# Patient Record
Sex: Male | Born: 1940 | Race: White | Hispanic: No | Marital: Married | Smoking: Former smoker
Health system: Southern US, Community
[De-identification: ages and names within clinical notes are randomized; demographics above are authoritative.]

## PROBLEM LIST (undated history)

## (undated) DIAGNOSIS — J449 Chronic obstructive pulmonary disease, unspecified: Secondary | ICD-10-CM

## (undated) DIAGNOSIS — I639 Cerebral infarction, unspecified: Secondary | ICD-10-CM

## (undated) DIAGNOSIS — R413 Other amnesia: Secondary | ICD-10-CM

## (undated) DIAGNOSIS — R51 Headache: Secondary | ICD-10-CM

## (undated) DIAGNOSIS — K219 Gastro-esophageal reflux disease without esophagitis: Secondary | ICD-10-CM

## (undated) DIAGNOSIS — Z9289 Personal history of other medical treatment: Secondary | ICD-10-CM

## (undated) DIAGNOSIS — IMO0001 Reserved for inherently not codable concepts without codable children: Secondary | ICD-10-CM

## (undated) DIAGNOSIS — R32 Unspecified urinary incontinence: Secondary | ICD-10-CM

## (undated) DIAGNOSIS — I4891 Unspecified atrial fibrillation: Secondary | ICD-10-CM

## (undated) DIAGNOSIS — M8580 Other specified disorders of bone density and structure, unspecified site: Secondary | ICD-10-CM

## (undated) DIAGNOSIS — E785 Hyperlipidemia, unspecified: Secondary | ICD-10-CM

## (undated) HISTORY — PX: INGUINAL HERNIA REPAIR: SHX194

## (undated) HISTORY — DX: Chronic obstructive pulmonary disease, unspecified: J44.9

## (undated) HISTORY — DX: Other amnesia: R41.3

## (undated) HISTORY — DX: Cerebral infarction, unspecified: I63.9

## (undated) HISTORY — PX: OTHER SURGICAL HISTORY: SHX169

## (undated) HISTORY — PX: CATARACT EXTRACTION, BILATERAL: SHX1313

## (undated) HISTORY — DX: Personal history of other medical treatment: Z92.89

## (undated) HISTORY — DX: Unspecified atrial fibrillation: I48.91

## (undated) HISTORY — DX: Unspecified urinary incontinence: R32

## (undated) HISTORY — DX: Hyperlipidemia, unspecified: E78.5

## (undated) HISTORY — DX: Headache: R51

## (undated) HISTORY — DX: Other specified disorders of bone density and structure, unspecified site: M85.80

---
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
## 2001-01-01 ENCOUNTER — Ambulatory Visit (HOSPITAL_COMMUNITY): Admission: RE | Admit: 2001-01-01 | Discharge: 2001-01-01 | Payer: Self-pay | Admitting: Gastroenterology

## 2001-01-01 ENCOUNTER — Encounter (INDEPENDENT_AMBULATORY_CARE_PROVIDER_SITE_OTHER): Payer: Self-pay | Admitting: *Deleted

## 2003-09-12 ENCOUNTER — Inpatient Hospital Stay (HOSPITAL_COMMUNITY): Admission: EM | Admit: 2003-09-12 | Discharge: 2003-09-14 | Payer: Self-pay

## 2003-11-02 ENCOUNTER — Ambulatory Visit (HOSPITAL_COMMUNITY): Admission: RE | Admit: 2003-11-02 | Discharge: 2003-11-02 | Payer: Self-pay | Admitting: Internal Medicine

## 2004-09-04 ENCOUNTER — Ambulatory Visit: Payer: Self-pay | Admitting: Internal Medicine

## 2004-09-30 ENCOUNTER — Ambulatory Visit: Payer: Self-pay | Admitting: Internal Medicine

## 2005-01-07 ENCOUNTER — Ambulatory Visit: Payer: Self-pay | Admitting: Internal Medicine

## 2005-03-26 ENCOUNTER — Ambulatory Visit: Payer: Self-pay | Admitting: Pulmonary Disease

## 2005-04-09 ENCOUNTER — Ambulatory Visit: Payer: Self-pay | Admitting: Internal Medicine

## 2005-04-30 ENCOUNTER — Ambulatory Visit: Payer: Self-pay | Admitting: Internal Medicine

## 2005-06-30 ENCOUNTER — Ambulatory Visit: Payer: Self-pay | Admitting: Internal Medicine

## 2005-07-01 ENCOUNTER — Ambulatory Visit: Payer: Self-pay | Admitting: Internal Medicine

## 2005-09-30 ENCOUNTER — Ambulatory Visit: Payer: Self-pay | Admitting: Internal Medicine

## 2005-12-30 ENCOUNTER — Ambulatory Visit: Payer: Self-pay | Admitting: Internal Medicine

## 2006-06-29 ENCOUNTER — Ambulatory Visit: Payer: Self-pay | Admitting: Internal Medicine

## 2006-12-28 ENCOUNTER — Ambulatory Visit: Payer: Self-pay | Admitting: Internal Medicine

## 2007-06-28 ENCOUNTER — Ambulatory Visit: Payer: Self-pay | Admitting: Internal Medicine

## 2007-07-08 ENCOUNTER — Ambulatory Visit: Payer: Self-pay | Admitting: Internal Medicine

## 2007-07-28 ENCOUNTER — Ambulatory Visit: Payer: Self-pay | Admitting: Internal Medicine

## 2007-12-24 DIAGNOSIS — M899 Disorder of bone, unspecified: Secondary | ICD-10-CM | POA: Insufficient documentation

## 2007-12-24 DIAGNOSIS — R519 Headache, unspecified: Secondary | ICD-10-CM | POA: Insufficient documentation

## 2007-12-24 DIAGNOSIS — J449 Chronic obstructive pulmonary disease, unspecified: Secondary | ICD-10-CM

## 2007-12-24 DIAGNOSIS — M949 Disorder of cartilage, unspecified: Secondary | ICD-10-CM

## 2007-12-24 DIAGNOSIS — J302 Other seasonal allergic rhinitis: Secondary | ICD-10-CM | POA: Insufficient documentation

## 2007-12-24 DIAGNOSIS — R51 Headache: Secondary | ICD-10-CM

## 2007-12-24 DIAGNOSIS — J3089 Other allergic rhinitis: Secondary | ICD-10-CM

## 2007-12-27 ENCOUNTER — Ambulatory Visit: Payer: Self-pay | Admitting: Internal Medicine

## 2008-01-05 ENCOUNTER — Ambulatory Visit: Payer: Self-pay | Admitting: Cardiology

## 2008-01-14 ENCOUNTER — Encounter: Payer: Self-pay | Admitting: Internal Medicine

## 2008-01-14 ENCOUNTER — Encounter: Payer: Self-pay | Admitting: Cardiology

## 2008-01-14 ENCOUNTER — Ambulatory Visit: Payer: Self-pay

## 2008-02-16 ENCOUNTER — Ambulatory Visit: Payer: Self-pay | Admitting: Cardiology

## 2008-06-28 ENCOUNTER — Ambulatory Visit: Payer: Self-pay | Admitting: Internal Medicine

## 2008-07-04 ENCOUNTER — Telehealth (INDEPENDENT_AMBULATORY_CARE_PROVIDER_SITE_OTHER): Payer: Self-pay | Admitting: *Deleted

## 2008-07-19 ENCOUNTER — Telehealth (INDEPENDENT_AMBULATORY_CARE_PROVIDER_SITE_OTHER): Payer: Self-pay | Admitting: *Deleted

## 2008-09-05 ENCOUNTER — Ambulatory Visit: Payer: Self-pay | Admitting: Internal Medicine

## 2008-10-04 ENCOUNTER — Ambulatory Visit: Payer: Self-pay | Admitting: Internal Medicine

## 2008-10-05 ENCOUNTER — Encounter: Payer: Self-pay | Admitting: Internal Medicine

## 2008-10-05 LAB — CONVERTED CEMR LAB: IgE (Immunoglobulin E), Serum: 12.9 intl units/mL (ref 0.0–180.0)

## 2008-10-10 ENCOUNTER — Encounter: Payer: Self-pay | Admitting: Internal Medicine

## 2008-10-12 ENCOUNTER — Ambulatory Visit: Payer: Self-pay | Admitting: Internal Medicine

## 2008-10-12 DIAGNOSIS — R0602 Shortness of breath: Secondary | ICD-10-CM | POA: Insufficient documentation

## 2008-11-10 ENCOUNTER — Ambulatory Visit: Payer: Self-pay | Admitting: Internal Medicine

## 2008-11-10 LAB — CONVERTED CEMR LAB
Basophils Absolute: 0.1 10*3/uL (ref 0.0–0.1)
Basophils Relative: 0.5 % (ref 0.0–3.0)
Eosinophils Absolute: 0.2 10*3/uL (ref 0.0–0.7)
Eosinophils Relative: 1.4 % (ref 0.0–5.0)
HCT: 46 % (ref 39.0–52.0)
Lymphocytes Relative: 12.7 % (ref 12.0–46.0)
MCHC: 33.9 g/dL (ref 30.0–36.0)
MCV: 97.9 fL (ref 78.0–100.0)
Monocytes Absolute: 0.7 10*3/uL (ref 0.1–1.0)
Neutrophils Relative %: 79.2 % — ABNORMAL HIGH (ref 43.0–77.0)
Platelets: 284 10*3/uL (ref 150.0–400.0)
RBC: 4.7 M/uL (ref 4.22–5.81)
RDW: 14.9 % — ABNORMAL HIGH (ref 11.5–14.6)
WBC: 11.3 10*3/uL — ABNORMAL HIGH (ref 4.5–10.5)

## 2008-11-27 LAB — CONVERTED CEMR LAB
A-1 Antitrypsin, Ser: 112 mg/dL (ref 83–200)
IgE (Immunoglobulin E), Serum: 10.5 intl units/mL (ref 0.0–180.0)

## 2008-12-26 ENCOUNTER — Ambulatory Visit: Payer: Self-pay | Admitting: Internal Medicine

## 2009-02-05 ENCOUNTER — Encounter: Payer: Self-pay | Admitting: Internal Medicine

## 2009-03-26 ENCOUNTER — Telehealth: Payer: Self-pay | Admitting: Internal Medicine

## 2009-03-27 ENCOUNTER — Ambulatory Visit: Payer: Self-pay | Admitting: Internal Medicine

## 2009-03-28 ENCOUNTER — Telehealth (INDEPENDENT_AMBULATORY_CARE_PROVIDER_SITE_OTHER): Payer: Self-pay | Admitting: *Deleted

## 2009-04-03 ENCOUNTER — Encounter: Payer: Self-pay | Admitting: Internal Medicine

## 2009-04-03 ENCOUNTER — Ambulatory Visit: Payer: Self-pay | Admitting: Oncology

## 2009-04-03 LAB — COMPREHENSIVE METABOLIC PANEL
Albumin: 3.9 g/dL (ref 3.5–5.2)
CO2: 26 mEq/L (ref 19–32)
Calcium: 9.3 mg/dL (ref 8.4–10.5)
Glucose, Bld: 114 mg/dL — ABNORMAL HIGH (ref 70–99)
Sodium: 137 mEq/L (ref 135–145)
Total Bilirubin: 0.6 mg/dL (ref 0.3–1.2)
Total Protein: 7.3 g/dL (ref 6.0–8.3)

## 2009-04-03 LAB — CBC & DIFF AND RETIC
Basophils Absolute: 0 10*3/uL (ref 0.0–0.1)
EOS%: 0.1 % (ref 0.0–7.0)
HGB: 14.9 g/dL (ref 13.0–17.1)
MCH: 32.9 pg (ref 27.2–33.4)
MCV: 99.1 fL — ABNORMAL HIGH (ref 79.3–98.0)
MONO%: 5.9 % (ref 0.0–14.0)
RBC: 4.53 10*6/uL (ref 4.20–5.82)
RDW: 13.9 % (ref 11.0–14.6)
Retic Ct Abs: 82.45 10*3/uL — ABNORMAL HIGH (ref 24.10–77.50)

## 2009-04-03 LAB — MORPHOLOGY: PLT EST: ADEQUATE

## 2009-04-03 LAB — CHCC SMEAR

## 2009-04-06 LAB — JAK2 GENOTYPR: JAK2 GenotypR: NOT DETECTED

## 2009-04-12 ENCOUNTER — Encounter: Payer: Self-pay | Admitting: Internal Medicine

## 2009-05-01 ENCOUNTER — Encounter: Payer: Self-pay | Admitting: Internal Medicine

## 2009-06-25 ENCOUNTER — Ambulatory Visit: Payer: Self-pay | Admitting: Internal Medicine

## 2009-07-02 ENCOUNTER — Telehealth: Payer: Self-pay | Admitting: Internal Medicine

## 2009-07-05 ENCOUNTER — Ambulatory Visit: Payer: Self-pay | Admitting: Internal Medicine

## 2009-07-07 ENCOUNTER — Encounter: Payer: Self-pay | Admitting: Internal Medicine

## 2009-07-10 ENCOUNTER — Telehealth: Payer: Self-pay | Admitting: Internal Medicine

## 2009-07-12 ENCOUNTER — Telehealth: Payer: Self-pay | Admitting: Internal Medicine

## 2009-07-12 ENCOUNTER — Encounter: Payer: Self-pay | Admitting: Internal Medicine

## 2009-07-23 ENCOUNTER — Encounter: Payer: Self-pay | Admitting: Internal Medicine

## 2009-07-25 ENCOUNTER — Telehealth (INDEPENDENT_AMBULATORY_CARE_PROVIDER_SITE_OTHER): Payer: Self-pay | Admitting: *Deleted

## 2009-07-30 ENCOUNTER — Encounter: Payer: Self-pay | Admitting: Internal Medicine

## 2009-08-05 ENCOUNTER — Telehealth: Payer: Self-pay | Admitting: Internal Medicine

## 2009-08-06 ENCOUNTER — Ambulatory Visit: Payer: Self-pay | Admitting: Internal Medicine

## 2009-08-27 ENCOUNTER — Ambulatory Visit: Payer: Self-pay | Admitting: Internal Medicine

## 2009-08-27 ENCOUNTER — Inpatient Hospital Stay (HOSPITAL_COMMUNITY): Admission: EM | Admit: 2009-08-27 | Discharge: 2009-09-11 | Payer: Self-pay | Admitting: Emergency Medicine

## 2009-08-27 ENCOUNTER — Ambulatory Visit: Payer: Self-pay | Admitting: Cardiology

## 2009-09-03 ENCOUNTER — Encounter: Payer: Self-pay | Admitting: Cardiovascular Disease

## 2009-09-04 ENCOUNTER — Encounter: Payer: Self-pay | Admitting: Cardiology

## 2009-09-11 ENCOUNTER — Telehealth (INDEPENDENT_AMBULATORY_CARE_PROVIDER_SITE_OTHER): Payer: Self-pay | Admitting: *Deleted

## 2009-09-14 ENCOUNTER — Telehealth (INDEPENDENT_AMBULATORY_CARE_PROVIDER_SITE_OTHER): Payer: Self-pay | Admitting: *Deleted

## 2009-09-14 DIAGNOSIS — R5381 Other malaise: Secondary | ICD-10-CM

## 2009-09-14 DIAGNOSIS — R5383 Other fatigue: Secondary | ICD-10-CM

## 2009-09-17 ENCOUNTER — Ambulatory Visit: Payer: Self-pay | Admitting: Internal Medicine

## 2009-09-18 ENCOUNTER — Encounter: Payer: Self-pay | Admitting: Internal Medicine

## 2009-09-20 ENCOUNTER — Telehealth: Payer: Self-pay | Admitting: Adult Health

## 2009-10-04 ENCOUNTER — Ambulatory Visit: Payer: Self-pay | Admitting: Internal Medicine

## 2009-10-05 ENCOUNTER — Ambulatory Visit: Payer: Self-pay | Admitting: Internal Medicine

## 2009-10-11 ENCOUNTER — Encounter: Payer: Self-pay | Admitting: Adult Health

## 2009-10-15 DIAGNOSIS — E785 Hyperlipidemia, unspecified: Secondary | ICD-10-CM

## 2009-10-15 DIAGNOSIS — I480 Paroxysmal atrial fibrillation: Secondary | ICD-10-CM | POA: Insufficient documentation

## 2009-10-16 ENCOUNTER — Ambulatory Visit: Payer: Self-pay | Admitting: Internal Medicine

## 2009-10-16 DIAGNOSIS — R259 Unspecified abnormal involuntary movements: Secondary | ICD-10-CM | POA: Insufficient documentation

## 2009-10-22 ENCOUNTER — Telehealth (INDEPENDENT_AMBULATORY_CARE_PROVIDER_SITE_OTHER): Payer: Self-pay | Admitting: *Deleted

## 2009-10-23 ENCOUNTER — Telehealth: Payer: Self-pay | Admitting: Internal Medicine

## 2009-10-23 ENCOUNTER — Telehealth (INDEPENDENT_AMBULATORY_CARE_PROVIDER_SITE_OTHER): Payer: Self-pay | Admitting: *Deleted

## 2009-10-23 ENCOUNTER — Encounter: Payer: Self-pay | Admitting: Adult Health

## 2009-10-23 DIAGNOSIS — I499 Cardiac arrhythmia, unspecified: Secondary | ICD-10-CM | POA: Insufficient documentation

## 2009-10-29 ENCOUNTER — Ambulatory Visit: Payer: Self-pay | Admitting: Internal Medicine

## 2009-10-30 ENCOUNTER — Encounter: Payer: Self-pay | Admitting: Internal Medicine

## 2009-10-30 ENCOUNTER — Ambulatory Visit: Payer: Self-pay | Admitting: Internal Medicine

## 2009-11-07 ENCOUNTER — Ambulatory Visit: Payer: Self-pay | Admitting: Internal Medicine

## 2009-11-07 ENCOUNTER — Ambulatory Visit: Payer: Self-pay

## 2009-11-13 ENCOUNTER — Encounter: Payer: Self-pay | Admitting: Internal Medicine

## 2009-11-15 ENCOUNTER — Telehealth (INDEPENDENT_AMBULATORY_CARE_PROVIDER_SITE_OTHER): Payer: Self-pay | Admitting: *Deleted

## 2009-12-04 ENCOUNTER — Ambulatory Visit: Payer: Self-pay | Admitting: Internal Medicine

## 2009-12-10 ENCOUNTER — Encounter: Payer: Self-pay | Admitting: Internal Medicine

## 2009-12-15 ENCOUNTER — Telehealth (INDEPENDENT_AMBULATORY_CARE_PROVIDER_SITE_OTHER): Payer: Self-pay | Admitting: *Deleted

## 2010-01-13 ENCOUNTER — Encounter: Payer: Self-pay | Admitting: Internal Medicine

## 2010-01-18 ENCOUNTER — Encounter: Payer: Self-pay | Admitting: Internal Medicine

## 2010-01-27 ENCOUNTER — Encounter: Payer: Self-pay | Admitting: Internal Medicine

## 2010-02-06 ENCOUNTER — Ambulatory Visit: Payer: Self-pay | Admitting: Internal Medicine

## 2010-02-06 ENCOUNTER — Telehealth (INDEPENDENT_AMBULATORY_CARE_PROVIDER_SITE_OTHER): Payer: Self-pay | Admitting: *Deleted

## 2010-02-06 DIAGNOSIS — J189 Pneumonia, unspecified organism: Secondary | ICD-10-CM

## 2010-02-06 DIAGNOSIS — R209 Unspecified disturbances of skin sensation: Secondary | ICD-10-CM | POA: Insufficient documentation

## 2010-02-06 LAB — CONVERTED CEMR LAB
Basophils Absolute: 0 10*3/uL (ref 0.0–0.1)
Basophils Relative: 0.1 % (ref 0.0–3.0)
Eosinophils Absolute: 0 10*3/uL (ref 0.0–0.7)
Eosinophils Relative: 0.1 % (ref 0.0–5.0)
HCT: 39.3 % (ref 39.0–52.0)
Hemoglobin: 13.3 g/dL (ref 13.0–17.0)
Lymphocytes Relative: 8.7 % — ABNORMAL LOW (ref 12.0–46.0)
Lymphs Abs: 2 10*3/uL (ref 0.7–4.0)
MCHC: 33.8 g/dL (ref 30.0–36.0)
MCV: 88.9 fL (ref 78.0–100.0)
Monocytes Absolute: 1.2 10*3/uL — ABNORMAL HIGH (ref 0.1–1.0)
Monocytes Relative: 5.4 % (ref 3.0–12.0)
Neutro Abs: 19.5 10*3/uL — ABNORMAL HIGH (ref 1.4–7.7)
Neutrophils Relative %: 85.7 % — ABNORMAL HIGH (ref 43.0–77.0)
Platelets: 272 10*3/uL (ref 150.0–400.0)
RBC: 4.42 M/uL (ref 4.22–5.81)
RDW: 17 % — ABNORMAL HIGH (ref 11.5–14.6)
WBC: 22.8 10*3/uL (ref 4.5–10.5)

## 2010-02-11 ENCOUNTER — Ambulatory Visit: Payer: Self-pay | Admitting: Internal Medicine

## 2010-02-18 ENCOUNTER — Telehealth (INDEPENDENT_AMBULATORY_CARE_PROVIDER_SITE_OTHER): Payer: Self-pay | Admitting: *Deleted

## 2010-04-05 ENCOUNTER — Ambulatory Visit: Payer: Self-pay | Admitting: Internal Medicine

## 2010-05-30 ENCOUNTER — Encounter: Payer: Self-pay | Admitting: Internal Medicine

## 2010-07-04 ENCOUNTER — Ambulatory Visit: Payer: Self-pay | Admitting: Internal Medicine

## 2010-07-22 ENCOUNTER — Encounter: Payer: Self-pay | Admitting: Internal Medicine

## 2010-08-15 ENCOUNTER — Encounter: Payer: Self-pay | Admitting: Internal Medicine

## 2010-08-24 ENCOUNTER — Telehealth: Payer: Self-pay | Admitting: Emergency Medicine

## 2010-08-28 ENCOUNTER — Telehealth (INDEPENDENT_AMBULATORY_CARE_PROVIDER_SITE_OTHER): Payer: Self-pay | Admitting: *Deleted

## 2010-09-11 ENCOUNTER — Encounter: Payer: Self-pay | Admitting: Internal Medicine

## 2010-09-11 ENCOUNTER — Ambulatory Visit
Admission: RE | Admit: 2010-09-11 | Discharge: 2010-09-11 | Payer: Self-pay | Source: Home / Self Care | Attending: Internal Medicine | Admitting: Internal Medicine

## 2010-09-26 NOTE — Miscellaneous (Signed)
Summary: BONE DENSITY  Clinical Lists Changes  Orders: Added new Test order of T-Bone Densitometry (77080) - Signed Added new Test order of T-Lumbar Vertebral Assessment (77082) - Signed 

## 2010-09-26 NOTE — Progress Notes (Signed)
Summary: PT and OT orders  Phone Note From Other Clinic   Caller: marie williams-pt care management at [facility] Call For: young Summary of Call: caller states that pt has been d/c'd from [facility]. there's an order w/ [facility] for pt to have PT and OT. pt's PCP is dr Andrey Campanile elkins. dr [name] doesn't sign for these things per caller. would dr young sign these orders for pt? call marie williams at [phone] Initial call taken by: Tivis Ringer,  September 11, 2009 10:42 AM  Follow-up for Phone Call        will you sign PT OT orders for this patient? Please advise. Carron Curie CMA  September 11, 2009 10:45 AM   Additional Follow-up for Phone Call Additional follow up Details #1::        Per CDY-will sign orders. Reynaldo Minium CMA  September 11, 2009 12:37 PM     Additional Follow-up for Phone Call Additional follow up Details #2::    per CY:  Yes, he will sign orders.    Called and spoke with Hilda Lias and informed her CY will sign orders.  Aundra Millet Reynolds LPN  September 11, 2009 12:43 PM

## 2010-09-26 NOTE — Progress Notes (Signed)
Summary: [facility] augmentin  Phone Note Call from Patient   Caller: Patient Call For: [name] Summary of Call: patient phoned thinks he has a virus or cold he is wheezing really  bad coughing up a thick yellow  mucas. He stated that he called on Saturday and was called in a Zpack he took the last one today and his symptons are better but he thinks that after he takes the last pill it make backtrack on him. [facility] [address] and he can be reached [phone]  Initial call taken by: Vedia Coffer,  August 28, 2010 11:14 AM  Follow-up for Phone Call        Pt says he took the last dose in Zpak today but he is still wheezing, cough is prod w/ yellow sputum, sore throat and SOB. Cough is worse at night and he has trouble sleeping. Took 30mg  on Prednisone today. Pt wants to know if he needs a stronger abx to knock out this infection. Pls advise. Allergies (verified):  1)  ! * Shrimp 2)  ! * Zolpidem Follow-up by: Michel Bickers CMA,  August 28, 2010 2:18 PM    New/Updated Medications: AUGMENTIN 875-125 MG TABS (AMOXICILLIN-POT CLAVULANATE) 1 by mouth two times a day Prescriptions: AUGMENTIN 875-125 MG TABS (AMOXICILLIN-POT CLAVULANATE) 1 by mouth two times a day  #14 x 0   Entered by:   Renold Genta RCP, LPN   Authorized by:   Waymon Budge MD   Signed by:   Renold Genta RCP, LPN on 16/05/9603   Method used:   [facility]        [facility]  [address] [phone][facility] (retail)       [address]       [place], [place]  [address]       Ph: [phone] or [phone]       Fax: [phone]   RxID:   [id number]  Renold Genta RCP, LPN  August 28, 2010 3:41 PM   Appended Document: [facility]in Rx sent to pharmacy for augmentin per [name], pt aware

## 2010-09-26 NOTE — Assessment & Plan Note (Signed)
Summary: Acute NP office visit - bronchitis   Primary Provider/Referring Provider:  [name]  CC:  1 month follow up - c/o dyspnea, increased fatigue, cough occ producing white/yellow mucus, temp up to 101, c/s, tigtness in chest x1week - denies wheezing, and chest congesiton.  also c/o increased edema in feet x2weeks and increased nervousness x6weeks.Marland Kitchen  History of Present Illness: August 06, 2009- Asthma/ COPD, steroid dependent, Allergic rhinitis..................Marland Kitchenwife here He had been qualified for ome O2 with room air sesats to 88%. Oxygen helped initially, but ov er last 2 days began noting easier dyspenea with exertion- worse yestrerday. His own sats range 80- 92% on oxygen. Pulse noted 40 - 140.  A cold with cough yesterday, much better today.   Occasional dry cough. Frontal- vetrtex headache. Grandkids have colds. He remains o n 30 mg prednisone daily.--TX w/ steroid burst, Xray with no acute changes.   August 27, 2009--Presents today for an acute office visit. Complains of increased SOB, some wheezing, cough occ producing yellow/brown mucus, increased edema in both feet x3days. He has had no improvement since last visit. Currently on prednisone 30mg . In office he is in resp. distress w/ increased accessory muscle use. HR is in the 180s. He will require admission. He is very dyspneic, no appetite and weak .Admitted   September 20, 2009--Pt presents for a post hosp follow up - states breathing has improved   pt would like a script for a new mattress for his hosp bed at home. Admitted 1/3-1/18/11  for  Atrial fibrillation/flutter status post ablation. 2. Acute on chronic respiratory failure in the setting of severe chronic obstructive pulmonary disease exacerbation  , PNA.  He was admitted to ICU, w/ failure of  BiPAP requirng intubation. Tx w/ aggressive pulmonary hygiene, IV abx, steroids and nebs.  He was sucessfully weaned from vent.  He underwent successful ablation on  September 05, 2009,  however, postprocedure did convert back to atrial  fibrillation requiring adjustment with medications per Cardiology to flecainide and Cardizem with transient Coumadin therapy.  Coumadin therapy has been stopped per Cardiology and aspirin recommended for discharge. Since discharge doing better w/ decreased dyspnea. Requests bone density set up.     October 05, 2009 --Presents for 1 month follow up -Recent exacerbation w/ Vent Dependent resp failure 1 month ago. Has had a slow recovery. Continues to have  dyspnea, increased fatigue, cough occ producing white/yellow mucus, temp up to 101, c/s, tigtness in chest x1week -  Cough and congestion are improving over last 2 days but still weak. fett swell in evening, resolves in am. Denies chest pain,  , orthopnea, hemoptysis, fever, n/v/d.   Medications Prior to Update: 1)  Prednisone 10 Mg  Tabs (Prednisone) .... Take 3 Tabs By Mouth  Every Morning 2)  Fosamax 70 Mg Tabs (Alendronate Sodium) .Marland Kitchen.. 1 By Mouth Weekly 3)  Proair Hfa 108 (90 Base) Mcg/act  Aers (Albuterol Sulfate) .... Inhale 2 Puffs Every 4 Hours As Needed 4)  Epipen 2-Pak 0.3 Mg/0.77ml (1:1000)  Devi (Epinephrine Hcl (Anaphylaxis)) .... As Needed 5)  Centrum Silver  Tabs (Multiple Vitamins-Minerals) .... Take 1 By Mouth Once Daily 6)  Ipratropium-Albuterol 0.5-2.5 (3) Mg/51ml Soln (Ipratropium-Albuterol) .Marland Kitchen.. 1 Neb Four Times A Day As Needed 7)  Portable Nebulizer For Aerosol Meds 8)  Oxygen 1 L/m Sleep/ Rest, 4 L/m Exertion .... [facility] 9)  Symbicort 160-4.5 Mcg/act Aero (Budesonide-Formoterol Fumarate) .... 2 Puffs Two Times A Day 10)  Wheelchair, Adult, Folding, With Foot Rests  11)  Power Wheelchair 12)  Tamsulosin Hcl 0.4 Mg Caps (Tamsulosin Hcl) .... Take 1 Tablet By Mouth Once A Day 13)  Tylenol 325 Mg Tabs (Acetaminophen) .... Per Bottle As Needed 14)  Prednisone 20 Mg Tabs (Prednisone) .... Taper As Directed On Hosp Discharge Sheet 15)  Ecotrin 325 Mg Tbec (Aspirin) .... Take 1 Tablet  By Mouth Once A Day 16)  Ensure  Liqd (Nutritional Supplements) .Marland Kitchen.. 1 Can By Mouth Three Times A Day 17)  Flecainide Acetate 100 Mg Tabs (Flecainide Acetate) .... Take 1 Tablet By Mouth Two Times A Day 18)  Nasal 0.65 % Soln (Saline) .Marland Kitchen.. 1 Spray Each Nostril Two Times A Day 19)  Verapamil Hcl Cr 240 Mg Cr-Tabs (Verapamil Hcl) .... Take 1 Tablet By Mouth Once A Day 20)  Mucinex 600 Mg Xr12h-Tab (Guaifenesin) .... Take 1-2 Tablets Every 12 Hours As Needed 21)  Furosemide 20 Mg Tabs (Furosemide) .Marland Kitchen.. 1 By Mouth Once Daily For Leg Swelling  Current Medications (verified): 1)  Fosamax 70 Mg Tabs (Alendronate Sodium) .Marland Kitchen.. 1 By Mouth Weekly 2)  Proair Hfa 108 (90 Base) Mcg/act  Aers (Albuterol Sulfate) .... Inhale 2 Puffs Every 4 Hours As Needed 3)  Epipen 2-Pak 0.3 Mg/0.83ml (1:1000)  Devi (Epinephrine Hcl (Anaphylaxis)) .... As Needed 4)  Centrum Silver  Tabs (Multiple Vitamins-Minerals) .... Take 1 By Mouth Once Daily 5)  Ipratropium-Albuterol 0.5-2.5 (3) Mg/53ml Soln (Ipratropium-Albuterol) .Marland Kitchen.. 1 Neb Four Times A Day As Needed 6)  Portable Nebulizer For Aerosol Meds 7)  Oxygen 1 L/m Sleep/ Rest, 4 L/m Exertion .... [employer] 8)  Symbicort 160-4.5 Mcg/act Aero (Budesonide-Formoterol Fumarate) .... 2 Puffs Two Times A Day 9)  Wheelchair, Adult, Folding, With Foot Rests 10)  Power Wheelchair 11)  Tamsulosin Hcl 0.4 Mg Caps (Tamsulosin Hcl) .... Take 1 Tablet By Mouth Once A Day 12)  Tylenol 325 Mg Tabs (Acetaminophen) .... Per Bottle As Needed 13)  Prednisone 20 Mg Tabs (Prednisone) .... Take 1 Tablet By Mouth Once A Day 14)  Ecotrin 325 Mg Tbec (Aspirin) .... Take 1 Tablet By Mouth Once A Day 15)  Ensure  Liqd (Nutritional Supplements) .Marland Kitchen.. 1 Can By Mouth Three Times A Day 16)  Flecainide Acetate 100 Mg Tabs (Flecainide Acetate) .... Take 1 Tablet By Mouth Two Times A Day 17)  Nasal 0.65 % Soln (Saline) .Marland Kitchen.. 1 Spray Each Nostril Two Times A Day 18)  Verapamil Hcl Cr 240 Mg Cr-Tabs (Verapamil Hcl)  .... Take 1 Tablet By Mouth Once A Day 19)  Mucinex 600 Mg Xr12h-Tab (Guaifenesin) .... Take 1-2 Tablets Every 12 Hours As Needed 20)  Furosemide 20 Mg Tabs (Furosemide) .Marland Kitchen.. 1 By Mouth Once Daily For Leg Swelling 21)  Phazyme 180 Mg Caps (Simethicone) .... Per Bottle  Allergies (verified): 1)  ! * Shrimp 2)  ! * Zolpidem  Past History:  Past Medical History: Last updated: 12/24/2007 Allergic Rhinitis Asthma [facility]  Past Surgical History: Last updated: 10-Oct-2008 Appendectomy Inguinal hernia x 2  Family History: Last updated: 2008-10-10 Brother- died COPD Sister- copd Father died-copd, lung cancer Mother- died age 35 old age  Social History: Last updated: 2008/10/10 Patient states former smoker.- heavy, 3 ppd, quit 40 yrs ago Married Product manager plant retired  Risk Factors: Smoking Status: quit (12/27/2007)  Review of Systems      See HPI  Vital Signs:  Patient profile:   70 year old male Height:      72 inches Weight:  156.13 pounds BMI:     21.25 O2 Sat:      94 % on 3L cont Temp:     96.8 degrees F oral Pulse rate:   100 / minute BP sitting:   104 / 64  (left arm) Cuff size:   regular  Vitals Entered By: Boone Master CNA (October 05, 2009 10:47 AM)  O2 Flow:  3L cont CC: 1 month follow up - c/o dyspnea, increased fatigue, cough occ producing white/yellow mucus, temp up to 101, c/s, tigtness in chest x1week - denies wheezing, chest congesiton.  also c/o increased edema in feet x2weeks and increased nervousness x6weeks. Is Patient Diabetic? No Comments Medications reviewed with patient Daytime contact number verified with patient. Boone Master CNA  October 05, 2009 10:51 AM    Physical Exam  Additional Exam:  General: A/Ox3 NAD  SKIN: plethora, steroid echymoses on arms NODES: no lymphadenopathy HEENT: [place]/AT, EOM- WNL, Conjuctivae- clear, PERRLA, TM-WNL, Nose- clear, Throat- clear and wnl NECK: Supple w/ fair ROM, JVD-  none,  CHEST: Decreased BS in bases., Coarse BS w/ scattered rhonchi HEART- Reg rate, no m/r/g  ABDOMEN: scaphoid EAV:WUJW, nl pulses, 1 + edema  NEURO: Grossly intact to observation, resting tremor hands      Impression & Recommendations:  Problem # 1:  BRONCHITIS, OBSTRUCTIVE CHRONIC, ACUTE EXACERBATION (ICD-491.21)  Slow to resolve exacerbation  REC: check xray  Avelox 400mg  once daily for 7 days Mucinex DM two times a day as needed cough/congestion  Increase prednisone 20mg  3 tabs for 3 days, then 2 tabs once daily for 3 days then back 20mg  once daily  I will call with xray results.  Please contact office for sooner follow up if symptoms do not improve or worsen  follow up [name] in 2-3 weeks and as needed  Hold fosamax   Orders: Est. Patient Level IV (11914)  Medications Added to Medication List This Visit: 1)  Prednisone 20 Mg Tabs (Prednisone) .... Take 1 tablet by mouth once a day 2)  Phazyme 180 Mg Caps (Simethicone) .... Per bottle 3)  Avelox 400 Mg Tabs (Moxifloxacin hcl) .Marland Kitchen.. 1 by mouth once daily  Complete Medication List: 1)  Fosamax 70 Mg Tabs (Alendronate sodium) .Marland Kitchen.. 1 by mouth weekly 2)  Proair Hfa 108 (90 Base) Mcg/act Aers (Albuterol sulfate) .... Inhale 2 puffs every 4 hours as needed 3)  Epipen 2-pak 0.3 Mg/0.58ml (1:1000) Devi (Epinephrine hcl (anaphylaxis)) .... As needed 4)  Centrum Silver Tabs (Multiple vitamins-minerals) .... Take 1 by mouth once daily 5)  Ipratropium-albuterol 0.5-2.5 (3) Mg/30ml Soln (Ipratropium-albuterol) .Marland Kitchen.. 1 neb four times a day as needed 6)  Portable Nebulizer For Aerosol Meds  7)  Oxygen 1 L/m Sleep/ Rest, 4 L/m Exertion  .... [employer] 8)  Symbicort 160-4.5 Mcg/act Aero (Budesonide-formoterol fumarate) .... 2 puffs two times a day 9)  Wheelchair, Adult, Folding, With Foot Rests  10)  Power Wheelchair  11)  Tamsulosin Hcl 0.4 Mg Caps (Tamsulosin hcl) .... Take 1 tablet by mouth once a day 12)  Tylenol 325 Mg Tabs  (Acetaminophen) .... Per bottle as needed 13)  Prednisone 20 Mg Tabs (Prednisone) .... Take 1 tablet by mouth once a day 14)  Ecotrin 325 Mg Tbec (Aspirin) .... Take 1 tablet by mouth once a day 15)  Ensure Liqd (Nutritional supplements) .Marland Kitchen.. 1 can by mouth three times a day 16)  Flecainide Acetate 100 Mg Tabs (Flecainide acetate) .... Take 1 tablet by mouth two times a day  17)  Nasal 0.65 % Soln (Saline) .Marland Kitchen.. 1 spray each nostril two times a day 18)  Verapamil Hcl Cr 240 Mg Cr-tabs (Verapamil hcl) .... Take 1 tablet by mouth once a day 19)  Mucinex 600 Mg Xr12h-tab (Guaifenesin) .... Take 1-2 tablets every 12 hours as needed 20)  Furosemide 20 Mg Tabs (Furosemide) .Marland Kitchen.. 1 by mouth once daily for leg swelling 21)  Phazyme 180 Mg Caps (Simethicone) .... Per bottle 22)  Avelox 400 Mg Tabs (Moxifloxacin hcl) .Marland Kitchen.. 1 by mouth once daily  Other Orders: T-2 View CXR ([id number])  Patient Instructions: 1)  Avelox 400mg  once daily for 7 days 2)  Mucinex DM two times a day as needed cough/congestion  3)  Increase prednisone 20mg  3 tabs for 3 days, then 2 tabs once daily for 3 days then back 20mg  once daily  4)  I will call with xray results.  5)  Please contact office for sooner follow up if symptoms do not improve or worsen  6)  follow up [name] in 2-3 weeks and as needed  7)  Hold fosamax  Prescriptions: AVELOX 400 MG TABS (MOXIFLOXACIN HCL) 1 by mouth once daily  #7 x 0   Entered and Authorized by:   Rubye Oaks NP   Signed by:   Rubye Oaks NP on 10/05/2009   Method used:   [facility] [phone]* (retail)       [address]       [place], [place]  [address]       Ph: [phone] or [phone]       Fax: [phone]   RxID:   [id number]

## 2010-09-26 NOTE — Progress Notes (Signed)
Summary: **SK 3/8** talk to nurse  Medications Added PROPAFENONE HCL 225 MG TABS (PROPAFENONE HCL) two times a day       Phone Note Call from Patient Call back at [facility] Phone [phone]   Caller: Patient Reason for Call: Talk to Nurse Summary of Call: pt wants to talk about his flecanide.  Initial call taken by: Edman Circle,  October 23, 2009 3:54 PM  Follow-up for Phone Call        S/W pt and his "bad" tremors have stopped since stopping the Flecainide. He had "nervousness" prior to starting Flecainide and it is about the same. Per Dr. Odessa Fleming last OV note he will think about starting Rythmol if the tremors stopped from Flecainide stop. Pt is aware that Dr. [name] will be back from [place] on Monday and I will s/w him at that point. He will stay off Flecainide for now.  Follow-up by: Duncan Dull, RN, BSN,  October 24, 2009 11:47 AM  Additional Follow-up for Phone Call Additional follow up Details #1::        Per Dr. [name], pt will start Propafenone 225mg  two times a day and we will get a GXT 1 wk from start which will be tomorrow. Pt aware.  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

## 2010-09-26 NOTE — Letter (Signed)
Summary: LMN/[facility]  LMN/[facility]   Imported By: Lester Lakehead 10/15/2009 10:22:29  _____________________________________________________________________  External Attachment:    Type:   Image     Comment:   External Document

## 2010-09-26 NOTE — Assessment & Plan Note (Signed)
Summary: NP follow up - post hosp   Primary Provider/Referring Provider:  [name]  CC:  post hosp follow up - states breathing is improved.  pt would like a script for a new mattress for his hosp bed at home.  History of Present Illness:  August 06, 2009- Asthma/ COPD, steroid dependent, Allergic rhinitis..................Marland Kitchenwife here He had been qualified for ome O2 with room air sesats to 88%. Oxygen helped initially, but ov er last 2 days began noting easier dyspenea with exertion- worse yestrerday. His own sats range 80- 92% on oxygen. Pulse noted 40 - 140.  A cold with cough yesterday, much better today.   Occasional dry cough. Frontal- vetrtex headache. Grandkids have colds. He remains o n 30 mg prednisone daily.--TX w/ steroid burst, Xray with no acute changes.   August 27, 2009--Presents today for an acute office visit. Complains of increased SOB, some wheezing, cough occ producing yellow/brown mucus, increased edema in both feet x3days. He has had no improvement since last visit. Currently on prednisone 30mg . In office he is in resp. distress w/ increased accessory muscle use. HR is in the 180s. He will require admission. He is very dyspneic, no appetite and weak .Admitted   September 20, 2009--Pt presents for a post hosp follow up - states breathing has improved   pt would like a script for a new mattress for his hosp bed at home. Admitted 1/3-1/18/11  for  Atrial fibrillation/flutter status post ablation. 2. Acute on chronic respiratory failure in the setting of severe     chronic obstructive pulmonary disease exacerbation  , PNA.  He was admitted to ICU, w/ failure of  BiPAP requirng intubation. Tx w/ aggressive pulmonary hygiene, IV abx, steroids and nebs.  He was sucessfully weaned from vent.  He underwent successful ablation on  September 05, 2009, however, postprocedure did convert back to atrial  fibrillation requiring adjustment with medications per Cardiology     to flecainide and  Cardizem with transient Coumadin therapy.  Coumadin therapy has been stopped per Cardiology and aspirin recommended for discharge. Since discharge doing better w/ decreased dyspnea. Requests bone density set up. .Denies chest pain, or cough , fever or abd pian. Atrial fibrillation/atrial flutter, status post ablation on September 05, 2009.  As per history of present illness, Mr. Mcadams presented     to the pulmonary office on August 27, 2009 complaining of     increasing shortness of breath, cough, worsening dyspnea, and lower     extremity edema.  At that time, he was noted to be in SVT in the     180s.  He was directly admitted to the intensive care unit.  A     trial of BiPAP was initiated.  However, he decompensated requiring     intubation for approximately 48 hours from August 28, 2009 to     August 30, 2009 at which time, he was subsequently extubated to     BiPAP and continued to improve from a respiratory standpoint.  He     was maintained initially on IV antibiotics for right lower lobe     infiltrate on chest x-ray, IV steroids, nebulized bronchodilators,     and low-dose anti-anxiolytics.  He underwent successful ablation on     September 05, 2009, however, postprocedure did convert back to atrial     fibrillation requiring adjustment with medications per Cardiology     to flecainide and Cardizem with transient Coumadin therapy.  Coumadin therapy has been stopped per Cardiology and aspirin     recommended for discharge. 2. Acute on chronic respiratory failure in the setting of severe COPD     with chronic steroid and O2 dependent.  See history of present     illness for details.  The patient will be discharged on previous     home doses of albuterol.  Xopenex was considered, however, he is     maintaining a sinus rhythm and we will trial him postdischarge on     continued albuterol.  If he converts back to atrial     flutter/fibrillation, we will need to definitively change  to     Xopenex. 3. Pneumonia, no organisms specified.  As per history of present     illness, all cultures have been negative during hospital admission.     He has completed antibiotic therapy for pneumonia with progressive     improvement from a respiratory standpoint.  He is chronically     prednisone depe   Medications Prior to Update: 1)  Prednisone 10 Mg  Tabs (Prednisone) .... Take 2 Tablets By Mouth in Am Take 1 Tablet By Mouth At Night and As Directed 2)  Fosamax 70 Mg Tabs (Alendronate Sodium) .Marland Kitchen.. 1 By Mouth Weekly 3)  Proair Hfa 108 (90 Base) Mcg/act  Aers (Albuterol Sulfate) .... Inhale 2 Puffs Every 4 Hours As Needed 4)  Epipen 2-Pak 0.3 Mg/0.82ml (1:1000)  Devi (Epinephrine Hcl (Anaphylaxis)) .... As Needed 5)  Centrum Silver  Tabs (Multiple Vitamins-Minerals) .... Take 1 By Mouth Once Daily 6)  Ipratropium-Albuterol 0.5-2.5 (3) Mg/60ml Soln (Ipratropium-Albuterol) .Marland Kitchen.. 1 Neb Four Times A Day As Needed 7)  Portable Nebulizer For Aerosol Meds 8)  Oxygen 2 L/m Sleep/ Rest, 4 L/m Exertion .... [facility] 9)  Symbicort 160-4.5 Mcg/act Aero (Budesonide-Formoterol Fumarate) .... 2 Puffs Two Times A Day 10)  Wheelchair, Adult, Folding, With Foot Rests 11)  Power Wheelchair  Current Medications (verified): 1)  Prednisone 10 Mg  Tabs (Prednisone) .... Take 3 Tabs By Mouth  Every Morning 2)  Fosamax 70 Mg Tabs (Alendronate Sodium) .Marland Kitchen.. 1 By Mouth Weekly 3)  Proair Hfa 108 (90 Base) Mcg/act  Aers (Albuterol Sulfate) .... Inhale 2 Puffs Every 4 Hours As Needed 4)  Epipen 2-Pak 0.3 Mg/0.24ml (1:1000)  Devi (Epinephrine Hcl (Anaphylaxis)) .... As Needed 5)  Centrum Silver  Tabs (Multiple Vitamins-Minerals) .... Take 1 By Mouth Once Daily 6)  Ipratropium-Albuterol 0.5-2.5 (3) Mg/65ml Soln (Ipratropium-Albuterol) .Marland Kitchen.. 1 Neb Four Times A Day As Needed 7)  Portable Nebulizer For Aerosol Meds 8)  Oxygen 1 L/m Sleep/ Rest, 4 L/m Exertion .... [employer] 9)  Symbicort 160-4.5 Mcg/act Aero  (Budesonide-Formoterol Fumarate) .... 2 Puffs Two Times A Day 10)  Wheelchair, Adult, Folding, With Foot Rests 11)  Power Wheelchair 12)  Tamsulosin Hcl 0.4 Mg Caps (Tamsulosin Hcl) .... Take 1 Tablet By Mouth Once A Day 13)  Tylenol 325 Mg Tabs (Acetaminophen) .... Per Bottle As Needed 14)  Prednisone 20 Mg Tabs (Prednisone) .... Taper As Directed On Hosp Discharge Sheet 15)  Ecotrin 325 Mg Tbec (Aspirin) .... Take 1 Tablet By Mouth Once A Day 16)  Ensure  Liqd (Nutritional Supplements) .Marland Kitchen.. 1 Can By Mouth Three Times A Day 17)  Flecainide Acetate 100 Mg Tabs (Flecainide Acetate) .... Take 1 Tablet By Mouth Two Times A Day 18)  Nasal 0.65 % Soln (Saline) .Marland Kitchen.. 1 Spray Each Nostril Two Times A Day 19)  Verapamil Hcl Cr  240 Mg Cr-Tabs (Verapamil Hcl) .... Take 1 Tablet By Mouth Once A Day 20)  Mucinex 600 Mg Xr12h-Tab (Guaifenesin) .... Take 1-2 Tablets Every 12 Hours As Needed  Allergies (verified): 1)  ! * Shrimp 2)  ! * Zolpidem  Past History:  Past Medical History: Last updated: 12/24/2007 Allergic Rhinitis Asthma [facility]  Past Surgical History: Last updated: 10-12-08 Appendectomy Inguinal hernia x 2  Family History: Last updated: 2008-10-12 Brother- died COPD Sister- copd Father died-copd, lung cancer Mother- died age 72 old age  Social History: Last updated: Oct 12, 2008 Patient states former smoker.- heavy, 3 ppd, quit 40 yrs ago Married Product manager plant retired  Risk Factors: Smoking Status: quit (12/27/2007)  Review of Systems      See HPI  Vital Signs:  Patient profile:   70 year old male Height:      72 inches Weight:      157.38 pounds BMI:     21.42 O2 Sat:      93 % on 2L cont Temp:     97.1 degrees F oral Pulse rate:   91 / minute BP sitting:   104 / 56  (left arm) Cuff size:   regular  Vitals Entered By: Boone Master CNA (September 17, 2009 4:58 PM)  O2 Flow:  2L cont CC: post hosp follow up - states breathing is improved.   pt would like a script for a new mattress for his hosp bed at home Is Patient Diabetic? No Comments Medications reviewed with patient Daytime contact number verified with patient. Boone Master CNA  September 17, 2009 4:58 PM    Physical Exam  Additional Exam:  General: A/Ox3 NAD  SKIN: plethora, steroid echymoses on arms NODES: no lymphadenopathy HEENT: [place]/AT, EOM- WNL, Conjuctivae- clear, PERRLA, TM-WNL, Nose- clear, Throat- clear and wnl NECK: Supple w/ fair ROM, JVD- none,  CHEST: Decreased BS in bases., Coarse wheeze bilaterally. increased WOB HEART- RRR  ABDOMEN: scaphoid ZOX:WRUE, nl pulses, no edema  NEURO: Grossly intact to observation, resting tremor hands      Impression & Recommendations:  Problem # 1:  BRONCHITIS, OBSTRUCTIVE CHRONIC, ACUTE EXACERBATION (ICD-491.21)  Recent exacerbation w/ acute on chronic resp failure requiring vent support Now resolved. PNA during hospital stay resolved on follow up cxr on 09/04/09.  REC:  Taper prednisone as directed and hold at 20mg  once daily  follow up [name] in 2-3 weeks and as needed   Orders: Est. Patient Level IV (45409)  Problem # 2:  SUPRAVENTRICULAR TACHYCARDIA (ICD-427.89)  SVT and atrial flutter s/p ablation.  Pt did have post ablation atrial fib, tx w/ meds.  Now on aspriin and flecanide.  REC:  follow up Dr. [name] as scheduled.     Orders: Est. Patient Level IV (81191)  Medications Added to Medication List This Visit: 1)  Prednisone 10 Mg Tabs (Prednisone) .... Take 3 tabs by mouth  every morning 2)  Oxygen 1 L/m Sleep/ Rest, 4 L/m Exertion  .... [employer] 3)  Tamsulosin Hcl 0.4 Mg Caps (Tamsulosin hcl) .... Take 1 tablet by mouth once a day 4)  Tylenol 325 Mg Tabs (Acetaminophen) .... Per bottle as needed 5)  Prednisone 20 Mg Tabs (Prednisone) .... Taper as directed on hosp discharge sheet 6)  Ecotrin 325 Mg Tbec (Aspirin) .... Take 1 tablet by mouth once a day 7)  Ensure Liqd (Nutritional supplements)  .Marland Kitchen.. 1 can by mouth three times a day 8)  Flecainide Acetate 100 Mg  Tabs (Flecainide acetate) .... Take 1 tablet by mouth two times a day 9)  Nasal 0.65 % Soln (Saline) .Marland Kitchen.. 1 spray each nostril two times a day 10)  Verapamil Hcl Cr 240 Mg Cr-tabs (Verapamil hcl) .... Take 1 tablet by mouth once a day 11)  Mucinex 600 Mg Xr12h-tab (Guaifenesin) .... Take 1-2 tablets every 12 hours as needed  Complete Medication List: 1)  Prednisone 10 Mg Tabs (Prednisone) .... Take 3 tabs by mouth  every morning 2)  Fosamax 70 Mg Tabs (Alendronate sodium) .Marland Kitchen.. 1 by mouth weekly 3)  Proair Hfa 108 (90 Base) Mcg/act Aers (Albuterol sulfate) .... Inhale 2 puffs every 4 hours as needed 4)  Epipen 2-pak 0.3 Mg/0.48ml (1:1000) Devi (Epinephrine hcl (anaphylaxis)) .... As needed 5)  Centrum Silver Tabs (Multiple vitamins-minerals) .... Take 1 by mouth once daily 6)  Ipratropium-albuterol 0.5-2.5 (3) Mg/77ml Soln (Ipratropium-albuterol) .Marland Kitchen.. 1 neb four times a day as needed 7)  Portable Nebulizer For Aerosol Meds  8)  Oxygen 1 L/m Sleep/ Rest, 4 L/m Exertion  .... [employer] 9)  Symbicort 160-4.5 Mcg/act Aero (Budesonide-formoterol fumarate) .... 2 puffs two times a day 10)  Wheelchair, Adult, Folding, With Foot Rests  11)  Power Wheelchair  12)  Tamsulosin Hcl 0.4 Mg Caps (Tamsulosin hcl) .... Take 1 tablet by mouth once a day 13)  Tylenol 325 Mg Tabs (Acetaminophen) .... Per bottle as needed 14)  Prednisone 20 Mg Tabs (Prednisone) .... Taper as directed on hosp discharge sheet 15)  Ecotrin 325 Mg Tbec (Aspirin) .... Take 1 tablet by mouth once a day 16)  Ensure Liqd (Nutritional supplements) .Marland Kitchen.. 1 can by mouth three times a day 17)  Flecainide Acetate 100 Mg Tabs (Flecainide acetate) .... Take 1 tablet by mouth two times a day 18)  Nasal 0.65 % Soln (Saline) .Marland Kitchen.. 1 spray each nostril two times a day 19)  Verapamil Hcl Cr 240 Mg Cr-tabs (Verapamil hcl) .... Take 1 tablet by mouth once a day 20)  Mucinex 600 Mg Xr12h-tab  (Guaifenesin) .... Take 1-2 tablets every 12 hours as needed  Other Orders: T-Bone Densitometry [phone])  Patient Instructions: 1)  Taper prednisone as directed and hold at 20mg  once daily  2)  follow up [name] in 2-3 weeks and as needed  3)  follow up Dr. [name] as scheduled.  4)  Set up Bone Density in 6 weeks  5)  Please contact office for sooner follow up if symptoms do not improve or worsen

## 2010-09-26 NOTE — Medication Information (Signed)
Summary: Designer, fashion/clothing used with Pensions consultant used with Nebulizer/[facility]   Imported By: Maryln Gottron 11/19/2009 13:42:30  _____________________________________________________________________  External Attachment:    Type:   Image     Comment:   External Document

## 2010-09-26 NOTE — Medication Information (Signed)
Summary: meds/[facility]  meds/[facility]   Imported By: Lester Franklin 06/04/2010 09:53:00  _____________________________________________________________________  External Attachment:    Type:   Image     Comment:   External Document

## 2010-09-26 NOTE — Letter (Signed)
Summary: LMN for [facility]  LMN for [facility]   Imported By: Sherian Rein 09/22/2009 08:12:28  _____________________________________________________________________  External Attachment:    Type:   Image     Comment:   External Document

## 2010-09-26 NOTE — Progress Notes (Signed)
  Phone Note Call from Patient   Caller: Patient Reason for Call: Acute Illness Summary of Call: Mr. Godshall called early am reporting body aches, sore throat, hoarseness, headaches, wheezing and cough with productive yellow sputum.  Pt requesting abx over phone, concerned he is going to get worse over the weekend.  Discussed with pt and [facility]  Encouraged to only fill if gets worse and to call on Monday am for appt with [name].  Also discussed if worsens to report to ED over weekend if needed.  Initial call taken by: Canary Brim NP-C,  December 15, 2009 2:18 PM

## 2010-09-26 NOTE — Progress Notes (Signed)
Summary: pt off meds  Phone Note Call from Patient Call back at Home Phone [phone]   Caller: Patient Call For: tammy p Reason for Call: Talk to Nurse Summary of Call: medicine you took him off of for his nerves...not as nervous but still some nervousness there.  Please let pt know what you recommend now. Initial call taken by: Eugene Gavia,  October 23, 2009 3:06 PM  Follow-up for Phone Call        called and spoke with pt.  pt states he last saw TP 10-05-2009 and was instructed to stop Flecainide d/t "spasms."  Pt states the "spasms" have stopped but is still "nervous."  .  Per EMR, it looks like Dr. [name] stopped the Flecainide and  had instructed pt to call and give them an update in 1 week.  informed pt to call Dr. Odessa Fleming office regarding this.  pt verbalized understanding.  Aundra Millet Reynolds LPN  October 23, 7844 3:28 PM

## 2010-09-26 NOTE — Progress Notes (Signed)
Summary: order Rolling walker  Phone Note Call from Patient Call back at [phone]   Caller: [facility] Call For: young Reason for Call: Talk to Nurse Summary of Call: pt is having PT - Physical Therapist recommend a walker w/ 5" wheels. Initial call taken by: Eugene Gavia,  September 14, 2009 4:47 PM  Follow-up for Phone Call        Please advise if we can send order for this, thanks! Vernie Murders  September 14, 2009 4:49 PM   Order sent to [facility] for rolling walker. Michel Bickers CMA  September 17, 2009 8:41 AM  New Problems: WEAKNESS (ICD-780.79)   New Problems: WEAKNESS (ICD-780.79)

## 2010-09-26 NOTE — Assessment & Plan Note (Signed)
Summary: gxt with klein dx 429.9   Cardiovascular Risk Assessment/Plan:      The patient's hypertensive risk group is category B: At least one risk factor (excluding diabetes) with no target organ damage.       Primary Provider:  Jeannetta [name]   History of Present Illness: Mark [name] is seen in followup for atrial flutter status post ablation undertaken by Dr. Ladona Ridgel back in January. This occurred in the context of COPD for which he is oxygen dependent. Reviewing the records he had postoperative atrial fibrillation and flecainide therapy was initiated.  He says he is much better since then with gradually improving strength and energy.  The flecainide was associated with a nervousness and sensation and we stopped it and began Rythmol  He notes that his breathing is worse today. He he attributes this at least initially to the change in weather  Thromboembolic risk factors are notable for a  CHADS Vas score of one, or Chads score of 0  Exercise Tolerance Test Cardiovascular Risk History:      Positive major cardiovascular risk factors include male age over 58 years old and hyperlipidemia.  Negative major cardiovascular risk factors include non-tobacco-user status.    Exercise Tolerance Test Results:    Ordering MD:        Tonna Corner    Interpreting MD:     Dr.Steven [name]    Stress Modality:     exercise-treadmill    Cardiac Imaging Performed:   none    Protocol:       Standard Bruce-maximal    MPHR (bpm):        151    85% MPHR (bpm):     128    Physical Exam  General:  Well developed, well nourished, in no acute distress. sitting in a wheelchair wearing oxygen Head:  normal HEENT Lungs:  decreased breath sounds with mild prolongation of expiratory phase and occasional wheeze Heart:  regular rate and rhythm without murmurs Abdomen:  soft and Extremities:  no clubbing cyanosis or edema   Impression & Recommendations:  Problem # 1:  ATRIAL FIBRILLATION (ICD-427.31) Will  continue with his bypass and on at 225 twice daily. We have discussed the potential that this could aggravate his breathing particularly if he is in the 7% of patients who are significant hydroxylators .  the patient thinks that his change in bleeding is related to the weather; he is advised to let us know.   His updated medication list for this problem includes:    Ecotrin 325 Mg Tbec (Aspirin) .Marland Kitchen... Take 1 tablet by mouth once a day    Propafenone Hcl 225 Mg Tabs (Propafenone hcl) .Marland Kitchen..Marland Kitchen Two times a day  Problem # 2:  BRONCHITIS, OBSTRUCTIVE CHRONIC, ACUTE EXACERBATION (ICD-491.21)  As above His updated medication list for this problem includes:    Proair Hfa 108 (90 Base) Mcg/act Aers (Albuterol sulfate) ..... Inhale 2 puffs every 4 hours as needed    Ipratropium-albuterol 0.5-2.5 (3) Mg/76ml Soln (Ipratropium-albuterol) .Marland Kitchen... 1 neb four times a day as needed    Symbicort 160-4.5 Mcg/act Aero (Budesonide-formoterol fumarate) .Marland Kitchen... 2 puffs two times a day    Prednisone 20 Mg Tabs (Prednisone) .Marland Kitchen... Take 1 tablet by mouth once a day  His updated medication list for this problem includes:    Proair Hfa 108 (90 Base) Mcg/act Aers (Albuterol sulfate) ..... Inhale 2 puffs every 4 hours as needed    Ipratropium-albuterol 0.5-2.5 (3) Mg/63ml Soln (Ipratropium-albuterol) .Marland Kitchen... 1 neb four  times a day as needed    Symbicort 160-4.5 Mcg/act Aero (Budesonide-formoterol fumarate) .Marland Kitchen... 2 puffs two times a day    Prednisone 20 Mg Tabs (Prednisone) .Marland Kitchen... Take 1 tablet by mouth once a day  Cardiovascular Risk Assessment/Plan:      The patient's hypertensive risk group is category B: At least one risk factor (excluding diabetes) with no target organ damage.

## 2010-09-26 NOTE — Progress Notes (Signed)
Summary: prescript  Phone Note Call from Patient Call back at [phone]   Caller: Patient Call For: young Summary of Call: pt want to know why her zerapamil 240mg  was denied [facility] Initial call taken by: Rickard Patience,  February 18, 2010 8:32 AM  Follow-up for Phone Call        pt called back, made him aware that Dr. [name] refills his Verapamil. Follow-up by: Eugene Gavia,  February 18, 2010 10:04 AM

## 2010-09-26 NOTE — Progress Notes (Signed)
Summary: prescription  Phone Note From Other Clinic Call back at [phone]   Caller: julie greeson[facility] Call For: young Summary of Call: Needs a rx for edema in his feet. Saw [name] 1/24 and discussed this. Initial call taken by: Darletta Moll,  September 20, 2009 4:07 PM  Follow-up for Phone Call        Per Raynelle Fanning, pt has pitting edema in both ankles and feet, righ tfoot is worse. Pt breathing is the same, vitals are all  normal per Raynelle Fanning. Pt currenlty does not have any fluid pills prescribed. Please advise.Carron Curie CMA  September 20, 2009 4:25 PM   Additional Follow-up for Phone Call Additional follow up Details #1::        Lasix 20mg  once daily as needed leg swelling 2 tabs first day, then 1 daily for 3 days then can use daily as needed for leg swelling.  Lasix 20mg  #30  3 refills pt to follow up [name] in 2 weeks, please make sure ov is set up  if not improving will need to be seen sooner pt aware   Additional Follow-up by: Deklan Minar NP,  September 20, 2009 5:29 PM    New/Updated Medications: FUROSEMIDE 20 MG TABS (FUROSEMIDE) 1 by mouth once daily for leg swelling Prescriptions: FUROSEMIDE 20 MG TABS (FUROSEMIDE) 1 by mouth once daily for leg swelling  #30 x 5   Entered and Authorized by:   Rubye Oaks NP   Signed by:   Rubye Oaks NP on 09/20/2009   Method used:   [phone][facility] (retail)       [address]       [place], [place]  [id number]       Ph: [phone] or [phone]       Fax: [phone]   RxID:   [id number]

## 2010-09-26 NOTE — Progress Notes (Signed)
Summary: fever and chills  Phone Note Call from Patient   Caller: Patient Call For: young Summary of Call: pt have fever chills and pain in side [facility] [address] Initial call taken by: Rickard Patience,  February 06, 2010 8:23 AM  Follow-up for Phone Call        Spoke with pt.  Pt c/o chills, body aches, sharp pain in right side when taking deep breath in and fever of 102-103 since yesterday.  Pt also states he had a prod cough with yellow mucus-starting taking mucinex which helped with cough.  Denies increase in SOB, wheezing, chest tightness.  Ok per Katie/CY for pt to come in today at 1115 am.  Pt aware to come in today at 1115 to be seen by CY.  Gweneth Dimitri RN  February 06, 2010 9:00 AM

## 2010-09-26 NOTE — Assessment & Plan Note (Signed)
Summary: fever, pain in right side / cj   Primary Provider/Referring Provider:  [name]  CC:  Accute visit-fever yesterday of 103; SOB, body aches, chills, and Right side pain when breathing in.Marland Kitchen  History of Present Illness: October 29, 2009- COPD/ bronchitis, allergiuc rhinitis, AF...................Marland Kitchenwife here He has seen NP last 2 visits and is due to return for bone density study tomorrow. Working with Dr [name] on AF and had to stop flecainide because of nervousness.  Breathing status sensitive to weather changes. Today he feels close to baseline with minimal cough He takes diuretic off and on as needed for ankle edema. Staying on prednisone 20 mg daily for now in adition to Symbicort which does help. had bilateral high anterior chest pains last week associated with hard cough. Gas pains treeated with simthacone.  December 04, 2009- COPD/ bronchjitis, allergic rhinitis, AF...........Marland Kitchenwife here He notes breathing better with less humidity. Dyspnea limits to walking 10-50 feet level. Stops to rest.  Denies chest pain. Takes diuretic every 2-3 days for edema. Wife often not available and limited by back pain. Home can accomodate wheelchair. He doesn't have exercise tolerance to maneuver his own wheel chair. He gets around home with cane, wheelchair, walker.  February 06, 2010- COPD/ bronchitis, allergic rhinitis, AF.....................Marland Kitchenwife here Felt normal until yesterday morning when he felt chilly. Turned heat on in car doing errand. Temp went up to 103. Stayed in bed. Today myalgias, sharp pain riht axilla. Coughed yellow. Started mucinex. Not more dyspneic. Sore muscles in legs diffusely without swelling. GI ok. No change in bladder- chronic frequency. Intermittent numbness left hand at least a month.    Asthma History    Initial Asthma Severity Rating:    Age range: 12+ years    Symptoms: daily    Nighttime Awakenings: 0-2/month    Interferes w/ normal activity: some limitations    SABA  use (not for EIB): >2 days/week but not >1X/day    Asthma Severity Assessment: Moderate Persistent   Preventive Screening-Counseling & Management  Alcohol-Tobacco     Smoking Status: quit     Year Started: 1957     Year Quit: 1967  Current Medications (verified): 1)  Proair Hfa 108 (90 Base) Mcg/act  Aers (Albuterol Sulfate) .... Inhale 2 Puffs Every 4 Hours As Needed 2)  Epipen 2-Pak 0.3 Mg/0.42ml (1:1000)  Devi (Epinephrine Hcl (Anaphylaxis)) .... As Needed 3)  Centrum Silver  Tabs (Multiple Vitamins-Minerals) .... Take 1 By Mouth Once Daily 4)  Ipratropium-Albuterol 0.5-2.5 (3) Mg/72ml Soln (Ipratropium-Albuterol) .Marland Kitchen.. 1 Neb Four Times A Day As Needed 5)  Portable Nebulizer For Aerosol Meds 6)  Oxygen 1 L/m Sleep/ Rest, 4 L/m Exertion .... [facility] 7)  Symbicort 160-4.5 Mcg/act Aero (Budesonide-Formoterol Fumarate) .... 2 Puffs Two Times A Day 8)  Power Wheelchair 9)  Tamsulosin Hcl 0.4 Mg Caps (Tamsulosin Hcl) .... Take 1 Tablet By Mouth Once A Day 10)  Tylenol 325 Mg Tabs (Acetaminophen) .... Per Bottle As Needed 11)  Prednisone 20 Mg Tabs (Prednisone) .... Take 1 Tablet By Mouth Once A Day 12)  Ecotrin 325 Mg Tbec (Aspirin) .... Take 1 Tablet By Mouth Once A Day 13)  Ensure  Liqd (Nutritional Supplements) .Marland Kitchen.. 1 Can By Mouth Three Times A Day 14)  Nasal 0.65 % Soln (Saline) .Marland Kitchen.. 1 Spray Each Nostril Two Times A Day 15)  Verapamil Hcl Cr 240 Mg Cr-Tabs (Verapamil Hcl) .... Take 1 Tablet By Mouth Once A Day 16)  Mucinex 600 Mg Xr12h-Tab (Guaifenesin) .Marland KitchenMarland KitchenMarland Kitchen  Take 1-2 Tablets Every 12 Hours As Needed 17)  Phazyme 180 Mg Caps (Simethicone) .... Per Bottle 18)  Propafenone Hcl 225 Mg Tabs (Propafenone Hcl) .... Two Times A Day 19)  One-A-Day Calcium Plus 500-100-50 Mg-Unit-Mg Chew (Calcium-Magnesium-Vitamin D) .Marland Kitchen.. 1 Two Times A Day  Allergies (verified): 1)  ! * Shrimp 2)  ! * Zolpidem  Past History:  Past Medical History: Last updated: 10/29/2009 ATRIAL FIBRILLATION  (ICD-427.31) HYPERLIPIDEMIA (ICD-272.4) SUPRAVENTRICULAR TACHYCARDIA (ICD-427.89) WEAKNESS (ICD-780.79) DYSPNEA (ICD-786.05) BRONCHITIS, OBSTRUCTIVE CHRONIC, ACUTE EXACERBATION (ICD-491.21) HEADACHE (ICD-784.0) OSTEOPENIA (ICD-733.90) C O P D (ICD-496) ASTHMA (ICD-493.90) ALLERGIC RHINITIS (ICD-477.9)  Past Surgical History: Last updated: 10/15/2009  catheter ablation of the atrial flutter isthmus in a patient with typical sustained atrial flutter with a total of 25 RF energy applications delivered to a very  thickened atrial flutter isthmus. cataract surgery bilaterally  appendectomy inguinal herniorrhaphy.      Family History: Last updated: 10/15/2009  Significant for his father who had a myocardial   infarction at age 38.  Brother- died COPD Sister- copd Father died-copd, lung cancer Mother- died age 54 old age  Social History: Last updated: 10/15/2009 Patient states former smoker.- heavy, 3 ppd, quit 40 yrs ago Married Product manager plant retired  Risk Factors: Smoking Status: quit (02/06/2010)  Review of Systems      See HPI       The patient complains of shortness of breath with activity, productive cough, chest pain, and irregular heartbeats.  The patient denies shortness of breath at rest, non-productive cough, coughing up blood, acid heartburn, indigestion, loss of appetite, weight change, abdominal pain, difficulty swallowing, sore throat, tooth/dental problems, headaches, nasal congestion/difficulty breathing through nose, and sneezing.    Vital Signs:  Patient profile:   70 year old male Height:      72 inches Weight:      162 pounds BMI:     22.05 O2 Sat:      92 % on 2 L/min Pulse rate:   101 / minute BP sitting:   112 / 62  (left arm) Cuff size:   regular  Vitals Entered By: Reynaldo Minium CMA (February 06, 2010 11:03 AM)  O2 Flow:  2 L/min CC: Accute visit-fever yesterday of 103; SOB, body aches, chills, Right side pain when breathing  in.   Physical Exam  Additional Exam:  General: A/Ox3 NAD  supplemental oxygen 2L/m 92% sat., wheelchair SKIN: plethora, steroid echymoses on arms NODES: no lymphadenopathy HEENT: [place]/AT, EOM- WNL, Conjuctivae- clear, PERRLA, TM-WNL, Nose- clear, Throat- clear and wnl NECK: Supple w/ fair ROM, JVD- none,  CHEST: Decreased BS, no wheeze or rhonchi HEART- Reg rate, no m/r/g  ABDOMEN: scaphoid VHQ:IONG, nl pulses, trace + edema  NEURO: Grossly intact to observation, resting tremor hands      Impression & Recommendations:  Problem # 1:  BRONCHITIS, OBSTRUCTIVE CHRONIC, ACUTE EXACERBATION (ICD-491.21)  Exam is not much different, but he has clearly dealt with an acute infection- more likely respiratory than UTI. I will give an antibiotic to cover both, get CBC and CXR.  He has had pneumovax at least twice in the past.  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 This is mostly fixed obstructive disease with little reversibility  Problem # 3:  NUMBNESS, HAND (ICD-782.0) New complaint today of numbness left whole hand, intermittent, nonprogressive, often noted on waking. Not associated with other nubness or weakness, blurred vision or headache. I asked him to discuss this with  Dr [name].  Medications Added to Medication List This Visit: 1)  Cipro 500 Mg Tabs (Ciprofloxacin hcl) .Marland Kitchen.. 1 daily x 7 days 2)  Cipro 500 Mg Tabs (Ciprofloxacin hcl) .Marland Kitchen.. 1 bid x 7 days  Other Orders: Est. Patient Level III (62694) Prescription Created Electronically [phone]) TLB-CBC Platelet - w/Differential ([id number]) T-2 View CXR ([id number])  Patient Instructions: 1)  Keep appointment- call earlier as needed 2)  Script for antibiotic sent to drug store Cipro 500 mg two times a day  3)  A chest x-ray has been recommended.  Your imaging study may require preauthorization.  4)  Lab 5)  ....................................................................................................... 6)  02/06/10  CXR: 7)  DG CHEST 2 VIEW - [id number] 8)    9)  Clinical Data: Chest pain.  Exacerbation of COPD.  Bronchitis. 10)    11)  CHEST - 2 VIEW 12)    13)  Comparison: 10/05/2009. 14)    15)  Findings: Severe emphysematous changes are again seen.  There is 16)  new superimposed airspace disease involving the right upper lobe. 17)  Chronic scarring at the left base is stable.  The visualized soft 18)  tissues and bony thorax are unremarkable. 19)    20)  IMPRESSION: 21)    22)  1.  New right upper lobe pneumonia. 23)  2.  Stable changes of severe COPD. 24)    25)  Read By:  Chauncey Fischer,  MD     26)  Released By:  Chauncey Fischer,  MD 27)  _____________________ Prescriptions: CIPRO 500 MG TABS (CIPROFLOXACIN HCL) 1 bid x 7 days  #14 x 0   Entered and Authorized by:   Waymon Budge MD   Signed by:   Waymon Budge MD on 02/06/2010   Method used:   Historical   RxID:   [id number] CIPRO 500 MG TABS (CIPROFLOXACIN HCL) 1 daily x 7 days  #7 x 0   Entered and Authorized by:   Waymon Budge MD   Signed by:   Waymon Budge MD on 02/06/2010   Method used:   Electronically to        [facility]  [address] [phone]* (retail)       [address]       [place], [place]  [id number]       Ph: [phone] or [phone]       Fax: [phone]   RxID:   [id number]

## 2010-09-26 NOTE — Letter (Signed)
Summary: LMN for Gel Press Pad/[facility]  LMN for Gel Press Pad/[facility]   Imported By: Sherian Rein 10/26/2009 07:21:22  _____________________________________________________________________  External Attachment:    Type:   Image     Comment:   External Document

## 2010-09-26 NOTE — Medication Information (Signed)
Summary: Symbicort/[facility]  Symbicort/[facility]   Imported By: Sherian Rein 01/31/2010 10:58:13  _____________________________________________________________________  External Attachment:    Type:   Image     Comment:   External Document

## 2010-09-26 NOTE — Assessment & Plan Note (Signed)
Summary: per check out/sf  Medications Added TOVIAZ 4 MG XR24H-TAB (FESOTERODINE FUMARATE) Take one tablet by mouth once daily.      Allergies Added:   Visit Type:  Follow-up Primary Provider:  [name]   History of Present Illness: Mr. Mark [name] is seen in followup for paroxysmal atrial fibrillation occurring in the context of severe oxygen-dependent COPD and prior ablation for atrial flutter. He has had no significant recurrent palpitations. He does note that on some occasions his heart rate as detected by his pulse ox ranges high as 130. This typically lasts only minutes and then returns to the 70-105 range where he typically resides.  He has had a recent episode of chest pain; however this occurred in the context of fevers and chills. He saw [name] at that time and was prescribed an antibiotic. His symptoms have largely abated.  He has had no problems with peripheral edema.  Current Medications (verified): 1)  Proair Hfa 108 (90 Base) Mcg/act  Aers (Albuterol Sulfate) .... Inhale 2 Puffs Every 4 Hours As Needed 2)  Epipen 2-Pak 0.3 Mg/0.25ml (1:1000)  Devi (Epinephrine Hcl (Anaphylaxis)) .... As Needed 3)  Centrum Silver  Tabs (Multiple Vitamins-Minerals) .... Take 1 By Mouth Once Daily 4)  Ipratropium-Albuterol 0.5-2.5 (3) Mg/75ml Soln (Ipratropium-Albuterol) .Marland Kitchen.. 1 Neb Four Times A Day As Needed 5)  Portable Nebulizer For Aerosol Meds 6)  Oxygen 1 L/m Sleep/ Rest, 4 L/m Exertion .... [facility] 7)  Symbicort 160-4.5 Mcg/act Aero (Budesonide-Formoterol Fumarate) .... 2 Puffs Two Times A Day 8)  Power Wheelchair 9)  Tamsulosin Hcl 0.4 Mg Caps (Tamsulosin Hcl) .... Take 1 Tablet By Mouth Once A Day 10)  Tylenol 325 Mg Tabs (Acetaminophen) .... Per Bottle As Needed 11)  Prednisone 20 Mg Tabs (Prednisone) .... Take 1 Tablet By Mouth Once A Day 12)  Ecotrin 325 Mg Tbec (Aspirin) .... Take 1 Tablet By Mouth Once A Day 13)  Ensure  Liqd (Nutritional Supplements) .Marland Kitchen.. 1 Can By Mouth Three Times  A Day 14)  Nasal 0.65 % Soln (Saline) .Marland Kitchen.. 1 Spray Each Nostril Two Times A Day 15)  Verapamil Hcl Cr 240 Mg Cr-Tabs (Verapamil Hcl) .... Take 1 Tablet By Mouth Once A Day 16)  Mucinex 600 Mg Xr12h-Tab (Guaifenesin) .... Take 1-2 Tablets Every 12 Hours As Needed 17)  Propafenone Hcl 225 Mg Tabs (Propafenone Hcl) .... Two Times A Day 18)  One-A-Day Calcium Plus 500-100-50 Mg-Unit-Mg Chew (Calcium-Magnesium-Vitamin D) .Marland Kitchen.. 1 Two Times A Day 19)  Cipro 500 Mg Tabs (Ciprofloxacin Hcl) .Marland Kitchen.. 1 Bid X 7 Days 20)  Toviaz 4 Mg Xr24h-Tab (Fesoterodine Fumarate) .... Take One Tablet By Mouth Once Daily.  Allergies (verified): 1)  ! * Shrimp 2)  ! * Zolpidem  Past History:  Past Medical History: Last updated: 10/29/2009 ATRIAL FIBRILLATION (ICD-427.31) HYPERLIPIDEMIA (ICD-272.4) SUPRAVENTRICULAR TACHYCARDIA (ICD-427.89) WEAKNESS (ICD-780.79) DYSPNEA (ICD-786.05) BRONCHITIS, OBSTRUCTIVE CHRONIC, ACUTE EXACERBATION (ICD-491.21) HEADACHE (ICD-784.0) OSTEOPENIA (ICD-733.90) C O P D (ICD-496) ASTHMA (ICD-493.90) ALLERGIC RHINITIS (ICD-477.9)  Vital Signs:  Patient profile:   70 year old male Height:      72 inches Weight:      164 pounds BMI:     22.32 Pulse rate:   99 / minute BP sitting:   128 / 70  (left arm)  Vitals Entered By: Laurance Flatten CMA (February 11, 2010 1:50 PM)  Physical Exam  General:  Well developed, well nourished, in no acute distress  wearing oxygen Lungs:  decreased breath sounds with prolonged expiratory phase  Heart:  regular rate and rhythm with a somewhat rapid. Abdomen:  soft nontender with active bowel sounds and without hepatomegaly Msk:  Back normal, normal gait. Muscle strength and tone normal. Neurologic:  alert and oriented and grossly normal sensory and motor function Skin:  superficial ecchymosis.  Intervals 0.13/0.10/0.35 otherwise normal Impression & Recommendations:  Problem # 1:  ATRIAL FIBRILLATION (ICD-427.31) the patient's atrial fibrillation  seems largely quiet at this time. Will continue him on  propafenone.  last Myoview was 2009 and normal left ankle is 2011 and demonstrated normal left ventricular function His updated medication list for this problem includes:    Ecotrin 325 Mg Tbec (Aspirin) .Marland Kitchen... Take 1 tablet by mouth once a day    Propafenone Hcl 225 Mg Tabs (Propafenone hcl) .Marland Kitchen..Marland Kitchen Two times a day  Problem # 2:  CHEST PAIN (ICD-786.50) I suspect his chest pain is related to infectious complication of his COPD. Given his Myoview scan no further cardiac evaluation be pursued currently His updated medication list for this problem includes:    Ecotrin 325 Mg Tbec (Aspirin) .Marland Kitchen... Take 1 tablet by mouth once a day    Verapamil Hcl Cr 240 Mg Cr-tabs (Verapamil hcl) .Marland Kitchen... Take 1 tablet by mouth once a day  Patient Instructions: 1)  Your physician wants you to follow-up in:6 months with Dr [name].   You will receive a reminder letter in the mail two months in advance. If you don't receive a letter, please call our office to schedule the follow-up appointment. 2)  Your physician recommends that you continue on your current medications as directed. Please refer to the Current Medication list given to you today.

## 2010-09-26 NOTE — Letter (Signed)
Summary: [facility]  [facility]   Imported By: Lester [place] 01/30/2010 10:33:59  _____________________________________________________________________  External Attachment:    Type:   Image     Comment:   External Document

## 2010-09-26 NOTE — Assessment & Plan Note (Signed)
Summary: MOBILITY ASSESSMENT PER PT///kp   Primary Provider/Referring Provider:  [name]  CC:  Followup for mobility assesment.  Pt states that his breathing is the same- no better or worse.  No new complaints today. Marland Kitchen  History of Present Illness:   October 05, 2009 --Presents for 1 month follow up -Recent exacerbation w/ Vent Dependent resp failure 1 month ago. Has had a slow recovery. Continues to have  dyspnea, increased fatigue, cough occ producing white/yellow mucus, temp up to 101, c/s, tigtness in chest x1week -  Cough and congestion are improving over last 2 days but still weak. fett swell in evening, resolves in am. Denies chest pain,  , orthopnea, hemoptysis, fever, n/v/d.   October 29, 2009- COPD/ bronchitis, allergiuc rhinitis, AF...................Marland Kitchenwife here He has seen NP last 2 visits and is due to return for bone density study tomorrow. Working with Dr [name] on AF and had to stop flecainide because of nervousness.  Breathing status sensitive to weather changes. Today he feels close to baseline with minimal cough He takes diuretic off and on as needed for ankle edema. Staying on prednisone 20 mg daily for now in adition to Symbicort which does help. had bilateral high anterior chest pains last week associated with hard cough. Gas pains treeated with simthacone.  December 04, 2009- COPD/ bronchjitis, allergic rhinitis, AF...........Marland Kitchenwife here He notes breathing better with less humidity. Dyspnea limits to walking 10-50 feet level. Stops to rest.  Denies chest pain. Takes diuretic every 2-3 days for edema. Wife often not available and limited by back pain. Home can accomodate wheelchair. He doesn't have exercise tolerance to maneuver his own wheel chair. He gets around home with cane, wheelchair, walker.     Current Medications (verified): 1)  Proair Hfa 108 (90 Base) Mcg/act  Aers (Albuterol Sulfate) .... Inhale 2 Puffs Every 4 Hours As Needed 2)  Epipen 2-Pak 0.3 Mg/0.76ml  (1:1000)  Devi (Epinephrine Hcl (Anaphylaxis)) .... As Needed 3)  Centrum Silver  Tabs (Multiple Vitamins-Minerals) .... Take 1 By Mouth Once Daily 4)  Ipratropium-Albuterol 0.5-2.5 (3) Mg/65ml Soln (Ipratropium-Albuterol) .Marland Kitchen.. 1 Neb Four Times A Day As Needed 5)  Portable Nebulizer For Aerosol Meds 6)  Oxygen 1 L/m Sleep/ Rest, 4 L/m Exertion .... [facility] 7)  Symbicort 160-4.5 Mcg/act Aero (Budesonide-Formoterol Fumarate) .... 2 Puffs Two Times A Day 8)  Power Wheelchair 9)  Tamsulosin Hcl 0.4 Mg Caps (Tamsulosin Hcl) .... Take 1 Tablet By Mouth Once A Day 10)  Tylenol 325 Mg Tabs (Acetaminophen) .... Per Bottle As Needed 11)  Prednisone 20 Mg Tabs (Prednisone) .... Take 1 Tablet By Mouth Once A Day 12)  Ecotrin 325 Mg Tbec (Aspirin) .... Take 1 Tablet By Mouth Once A Day 13)  Ensure  Liqd (Nutritional Supplements) .Marland Kitchen.. 1 Can By Mouth Three Times A Day 14)  Nasal 0.65 % Soln (Saline) .Marland Kitchen.. 1 Spray Each Nostril Two Times A Day 15)  Verapamil Hcl Cr 240 Mg Cr-Tabs (Verapamil Hcl) .... Take 1 Tablet By Mouth Once A Day 16)  Mucinex 600 Mg Xr12h-Tab (Guaifenesin) .... Take 1-2 Tablets Every 12 Hours As Needed 17)  Phazyme 180 Mg Caps (Simethicone) .... Per Bottle 18)  Propafenone Hcl 225 Mg Tabs (Propafenone Hcl) .... Two Times A Day 19)  One-A-Day Calcium Plus 500-100-50 Mg-Unit-Mg Chew (Calcium-Magnesium-Vitamin D) .Marland Kitchen.. 1 Two Times A Day  Allergies (verified): 1)  ! * Shrimp 2)  ! * Zolpidem  Past History:  Past Medical History: Last updated: 10/29/2009 ATRIAL FIBRILLATION (  ICD-427.31) HYPERLIPIDEMIA (ICD-272.4) SUPRAVENTRICULAR TACHYCARDIA (ICD-427.89) WEAKNESS (ICD-780.79) DYSPNEA (ICD-786.05) BRONCHITIS, OBSTRUCTIVE CHRONIC, ACUTE EXACERBATION (ICD-491.21) HEADACHE (ICD-784.0) OSTEOPENIA (ICD-733.90) C O P D (ICD-496) ASTHMA (ICD-493.90) ALLERGIC RHINITIS (ICD-477.9)  Past Surgical History: Last updated: 10/15/2009  catheter ablation of the atrial flutter isthmus in a patient  with typical sustained atrial flutter with a total of 25 RF energy applications delivered to a very  thickened atrial flutter isthmus. cataract surgery bilaterally  appendectomy inguinal herniorrhaphy.      Family History: Last updated: 10/15/2009  Significant for his father who had a myocardial   infarction at age 62.  Brother- died COPD Sister- copd Father died-copd, lung cancer Mother- died age 36 old age  Social History: Last updated: 10/15/2009 Patient states former smoker.- heavy, 3 ppd, quit 40 yrs ago Married Product manager plant retired  Risk Factors: Smoking Status: quit (12/27/2007)  Review of Systems      See HPI  The patient denies anorexia, fever, weight loss, weight gain, vision loss, decreased hearing, hoarseness, chest pain, syncope, dyspnea on exertion, peripheral edema, prolonged cough, headaches, hemoptysis, and severe indigestion/heartburn.    Vital Signs:  Patient profile:   70 year old male Weight:      161 pounds O2 Sat:      92 % on 1 L/min Pulse rate:   105 / minute BP sitting:   114 / 70  (left arm)  Vitals Entered By: Vernie Murders (December 04, 2009 1:56 PM)  O2 Flow:  1 L/min  Physical Exam  Additional Exam:  General: A/Ox3 NAD  supplemental oxygen 1L/m 92% sat., wheelchair SKIN: plethora, steroid echymoses on arms NODES: no lymphadenopathy HEENT: [place]/AT, EOM- WNL, Conjuctivae- clear, PERRLA, TM-WNL, Nose- clear, Throat- clear and wnl NECK: Supple w/ fair ROM, JVD- none,  CHEST: Decreased BS, no wheeze or rhonchi HEART- Reg rate, no m/r/g  ABDOMEN: scaphoid ZOX:WRUE, nl pulses, trace + edema  NEURO: Grossly intact to observation, resting tremor hands      Impression & Recommendations:  Problem # 1:  C O P D (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 He is markedly limited within his own home by weakness and dyspnea and needs mobility assistance beyond what his wife can provide with standard wheelchair.  Pulmonary status now is near  his baseline and controlled.   Problem # 2:  OSTEOPENIA (ICD-733.90) Increased fx risk based on bone density assay. I recommended he go back on fosamax he dropped in  February. He will confirm with Dr [name].  Medications Added to Medication List This Visit: 1)  One-a-day Calcium Plus 500-100-50 Mg-unit-mg Chew (Calcium-magnesium-vitamin d) .Marland Kitchen.. 1 two times a day  Other Orders: Est. Patient Level IV (45409)  Patient Instructions: 1)  Please schedule a follow-up appointment in 4 months. 2)  I will deal with mobility forms when available. 3)  Resume fosamax 70 mg per week. Verify this with Dr [name] for his long term management.

## 2010-09-26 NOTE — Consult Note (Signed)
[facility]: [facility]   Imported By: Roderic Ovens 09/19/2009 14:51:47  _____________________________________________________________________  External Attachment:    Type:   Image     Comment:   External Document

## 2010-09-26 NOTE — Assessment & Plan Note (Signed)
Summary: sob/ no energy cough congestion/ mbw   Primary Provider/Referring Provider:  Jeannetta [name]  CC:  increased SOB, some wheezing, cough occ producing yellow/brown mucus, and increased edema in both feet x3days.  History of Present Illness: HOSPITAL ADMISSION   August 06, 2009- Asthma/ COPD, steroid dependent, Allergic rhinitis..................Marland Kitchenwife here He had been qualified for ome O2 with room air sesats to 88%. Oxygen helped initially, but ov er last 2 days began noting easier dyspenea with exertion- worse yestrerday. His own sats range 80- 92% on oxygen. Pulse noted 40 - 140.  A cold with cough yesterday, much better today. Sore throat and some chilling yesterday. No definite fever. Occasional dry cough. Frontal- vetrtex headache. Grandkids have colds. He remains o n 30 mg prednisone daily.--TX w/ steroid burst, Xray with no acute changes.   August 27, 2009--Presents today for an acute office visit. Complains of increased SOB, some wheezing, cough occ producing yellow/brown mucus, increased edema in both feet x3days. He has had no improvement since last visit. Currently on prednisone 30mg . In office he is in resp. distress w/ increased accessory muscle use. HR is in the 180s. He will require admission. He is very dyspneic, no appetite and weak .Denies chest pain, hemoptysis, fever, n/v/d, edema, headache, bloody stools, urinary symtpoms or back pain. no confusion.     Medications Prior to Update: 1)  Prednisone 10 Mg  Tabs (Prednisone) .... Take 2 Tablets By Mouth in Am Take 1 Tablet By Mouth At Night and As Directed 2)  Fosamax 70 Mg Tabs (Alendronate Sodium) .Marland Kitchen.. 1 By Mouth Weekly 3)  Proair Hfa 108 (90 Base) Mcg/act  Aers (Albuterol Sulfate) .... Inhale 2 Puffs Every 4 Hours As Needed 4)  Epipen 2-Pak 0.3 Mg/0.80ml (1:1000)  Devi (Epinephrine Hcl (Anaphylaxis)) .... As Needed 5)  Centrum Silver  Tabs (Multiple Vitamins-Minerals) .... Take 1 By Mouth Once Daily 6)  Ipratropium-Albuterol  0.5-2.5 (3) Mg/55ml Soln (Ipratropium-Albuterol) .Marland Kitchen.. 1 Neb Four Times A Day As Needed 7)  Portable Nebulizer For Aerosol Meds 8)  Oxygen 2 L/m Sleep/ Rest, 4 L/m Exertion .... [employer] 9)  Symbicort 160-4.5 Mcg/act Aero (Budesonide-Formoterol Fumarate) .... 2 Puffs Two Times A Day 10)  Wheelchair, Adult, Folding, With Foot Rests 11)  Power Wheelchair  Current Medications (verified): 1)  Prednisone 10 Mg  Tabs (Prednisone) .... Take 2 Tablets By Mouth in Am Take 1 Tablet By Mouth At Night and As Directed 2)  Fosamax 70 Mg Tabs (Alendronate Sodium) .Marland Kitchen.. 1 By Mouth Weekly 3)  Proair Hfa 108 (90 Base) Mcg/act  Aers (Albuterol Sulfate) .... Inhale 2 Puffs Every 4 Hours As Needed 4)  Epipen 2-Pak 0.3 Mg/0.83ml (1:1000)  Devi (Epinephrine Hcl (Anaphylaxis)) .... As Needed 5)  Centrum Silver  Tabs (Multiple Vitamins-Minerals) .... Take 1 By Mouth Once Daily 6)  Ipratropium-Albuterol 0.5-2.5 (3) Mg/4ml Soln (Ipratropium-Albuterol) .Marland Kitchen.. 1 Neb Four Times A Day As Needed 7)  Portable Nebulizer For Aerosol Meds 8)  Oxygen 2 L/m Sleep/ Rest, 4 L/m Exertion .... [employer] 9)  Symbicort 160-4.5 Mcg/act Aero (Budesonide-Formoterol Fumarate) .... 2 Puffs Two Times A Day 10)  Wheelchair, Adult, Folding, With Foot Rests 11)  Power Wheelchair  Allergies (verified): 1)  ! * Shrimp  Past History:  Past Medical History: Last updated: 12/24/2007 Allergic Rhinitis Asthma C O P D  Past Surgical History: Last updated: 10-18-08 Appendectomy Inguinal hernia x 2  Family History: Last updated: 2008-10-18 Brother- died COPD Sister- copd Father died-copd, lung cancer Mother- died age 42  old age  Social History: Last updated: 10/04/2008 Patient states former smoker.- heavy, 3 ppd, quit 40 yrs ago Married Product manager plant retired  Risk Factors: Smoking Status: quit (12/27/2007)  Review of Systems  The patient denies fever, weight loss, weight gain, vision loss, decreased hearing,  hoarseness, chest pain, syncope, prolonged cough, headaches, hemoptysis, abdominal pain, melena, hematochezia, severe indigestion/heartburn, hematuria, incontinence, muscle weakness, suspicious skin lesions, transient blindness, difficulty walking, depression, unusual weight change, abnormal bleeding, and enlarged lymph nodes.    Vital Signs:  Patient profile:   70 year old male Height:      72 inches Weight:      171.25 pounds BMI:     23.31 O2 Sat:      95 % on 4L cont Temp:     96.9 degrees F oral Pulse rate:   180 / minute BP sitting:   126 / 84  (left arm) Cuff size:   regular  Vitals Entered By: Boone Master CNA (August 27, 2009 10:37 AM)  O2 Flow:  4L cont CC: increased SOB, some wheezing, cough occ producing yellow/brown mucus, increased edema in both feet x3days Is Patient Diabetic? No Comments Medications reviewed with patient Daytime contact number verified with patient. Boone Master CNA  August 27, 2009 10:38 AM    Physical Exam  Additional Exam:  General: A/Ox3;  resp distress, accessory mucsle use.  SKIN: plethora, steroid echymoses on arms NODES: no lymphadenopathy HEENT: [place]/AT, EOM- WNL, Conjuctivae- clear, PERRLA, TM-WNL, Nose- clear, Throat- clear and wnl NECK: Supple w/ fair ROM, JVD- none,  CHEST: Decreased BS in bases., Coarse wheeze bilaterally. increased WOB HEART- irregular tachy, no m/g/r heard ABDOMEN: scaphoid ZOX:WRUE, nl pulses, no edema  NEURO: Grossly intact to observation, resting tremor hands  EKG w/ SVT and PVC       Impression & Recommendations:  Problem # 1:  SUPRAVENTRICULAR TACHYCARDIA (ICD-427.89)  HR at 180, c/w SVT on EKG He is to be admitted to ICU, EMS called for stat transfer to ICU AT [facility]  O2 on w/ good sats. B/P stable.  [name] in w/ pt evaluation.   Orders: No Charge Patient Arrived (NCPA0) (NCPA0)  Problem # 2:  BRONCHITIS, OBSTRUCTIVE CHRONIC, ACUTE EXACERBATION (ICD-491.21)  Refractory to OP  therapy.-chronically steroid dependent.  CXR pending.  will tx empirically w/ Avelox for posible PNA.  bronchial hygiene regimen. and steorids.   Orders: No Charge Patient Arrived (NCPA0) (NCPA0)  Complete Medication List: 1)  Prednisone 10 Mg Tabs (Prednisone) .... Take 2 tablets by mouth in am take 1 tablet by mouth at night and as directed 2)  Fosamax 70 Mg Tabs (Alendronate sodium) .Marland Kitchen.. 1 by mouth weekly 3)  Proair Hfa 108 (90 Base) Mcg/act Aers (Albuterol sulfate) .... Inhale 2 puffs every 4 hours as needed 4)  Epipen 2-pak 0.3 Mg/0.78ml (1:1000) Devi (Epinephrine hcl (anaphylaxis)) .... As needed 5)  Centrum Silver Tabs (Multiple vitamins-minerals) .... Take 1 by mouth once daily 6)  Ipratropium-albuterol 0.5-2.5 (3) Mg/66ml Soln (Ipratropium-albuterol) .Marland Kitchen.. 1 neb four times a day as needed 7)  Portable Nebulizer For Aerosol Meds  8)  Oxygen 2 L/m Sleep/ Rest, 4 L/m Exertion  .... [employer] 9)  Symbicort 160-4.5 Mcg/act Aero (Budesonide-formoterol fumarate) .... 2 puffs two times a day 10)  Wheelchair, Adult, Folding, With Foot Rests  11)  Power Wheelchair   Patient Instructions: 1)  Admit to hospital    Immunization History:  Influenza Immunization History:    Influenza:  historical (03/25/2009)    CardioPerfect ECG  ID: [id number] Patient: Mark [name], Mark [name] DOB: 1940/10/17 Age: 70 Years Old Sex: Male Race: White Height: 72 Weight: 171.25 Status: Unconfirmed Past Medical History:  Allergic Rhinitis Asthma C O P D  Recorded: 08/27/2009 10:51 AM QRS: 88 QT/QTc/QTd: 278 ms / 492 ms / 33 ms - Heart rate (maximum exercise)  / 141 deg / -115 deg - Heart rate (maximum exercise)  Heartrate: 182 bpm  Interpretation:   supraventricular tachycardia  premature ventricular complexes or aberrantly conducted complexes  marked right axis deviation  moderate high-lateral and lateral repolarization disturbance, consider ischemia or LV overload   negative T in V5 V6    with  small negative T in I aVL    with flat or low negative T in V4   Abnormal ECG

## 2010-09-26 NOTE — Assessment & Plan Note (Signed)
Summary: per check /saf      Allergies Added:   Visit Type:  Follow-up Primary Provider:  [name]  CC:  headache-sometimes.  History of Present Illness: pt did not have a list of meds with him today went over them with him. Caralee Ates CMA  September 11, 2010 9:36 AM  Mark [name] is seen in followup for paroxysmal atrial fibrillation occurring in the context of severe oxygen-dependent COPD and prior ablation for atrial flutter. He takes Rythmol for this.   He has had no significant recurrent palpitations. He does note that on some occasions his heart rate as detected by his pulse ox ranges high as 130. This typically lasts only minutes and then returns to the 70-105 range where he typically resides. he is unaware except as pulse ox of his heart rate excursions   He has had no problems with peripheral edema.  Problems Prior to Update: 1)  Numbness, Hand  (ICD-782.0) 2)  Ventricular Arrhythmia  (ICD-427.9) 3)  Tremor  (ICD-781.0) 4)  Atrial Fibrillation  (ICD-427.31) 5)  Hyperlipidemia  (ICD-272.4) 6)  Weakness  (ICD-780.79) 7)  Dyspnea  (ICD-786.05) 8)  Bronchitis, Obstructive Chronic, Acute Exacerbation  (ICD-491.21) 9)  Headache  (ICD-784.0) 10)  Osteopenia  (ICD-733.90) 11)  C O P D  (ICD-496) 12)  Asthma  (ICD-493.90) 13)  Allergic Rhinitis  (ICD-477.9) 14)  Pneumonia  (ICD-486)  Current Medications (verified): 1)  Proair Hfa 108 (90 Base) Mcg/act  Aers (Albuterol Sulfate) .... Inhale 2 Puffs Every 4 Hours As Needed 2)  Epipen 2-Pak 0.3 Mg/0.36ml (1:1000)  Devi (Epinephrine Hcl (Anaphylaxis)) .... As Needed 3)  Centrum Silver  Tabs (Multiple Vitamins-Minerals) .... Take 1 By Mouth Once Daily 4)  Ipratropium-Albuterol 0.5-2.5 (3) Mg/61ml Soln (Ipratropium-Albuterol) .Marland Kitchen.. 1 Neb Four Times A Day As Needed 5)  Portable Nebulizer For Aerosol Meds 6)  Oxygen 1 L/m Sleep/ Rest, 4 L/m Exertion .... [employer] 7)  Symbicort 160-4.5 Mcg/act Aero (Budesonide-Formoterol Fumarate) .... 2 Puffs  Two Times A Day 8)  Power Wheelchair 9)  Tamsulosin Hcl 0.4 Mg Caps (Tamsulosin Hcl) .... Take 1 Tablet By Mouth Once A Day 10)  Tylenol 325 Mg Tabs (Acetaminophen) .... Per Bottle As Needed 11)  Prednisone 20 Mg Tabs (Prednisone) .... Take 1 Tablet By Mouth Once A Day 12)  Ecotrin 325 Mg Tbec (Aspirin) .... Take 1 Tablet By Mouth Once A Day 13)  Ensure  Liqd (Nutritional Supplements) .Marland Kitchen.. 1 Can By Mouth Three Times A Day 14)  Nasal 0.65 % Soln (Saline) .Marland Kitchen.. 1 Spray Each Nostril Two Times A Day 15)  Verapamil Hcl Cr 240 Mg Cr-Tabs (Verapamil Hcl) .... Take 1 Tablet By Mouth Once A Day 16)  Mucinex 600 Mg Xr12h-Tab (Guaifenesin) .... Take 1-2 Tablets Every 12 Hours As Needed 17)  Propafenone Hcl 225 Mg Tabs (Propafenone Hcl) .... Two Times A Day 18)  One-A-Day Calcium Plus 500-100-50 Mg-Unit-Mg Chew (Calcium-Magnesium-Vitamin D) .Marland Kitchen.. 1 Two Times A Day 19)  Toviaz 4 Mg Xr24h-Tab (Fesoterodine Fumarate) .... Take One Tablet By Mouth Once Daily. 20)  Augmentin 875-125 Mg Tabs (Amoxicillin-Pot Clavulanate) .Marland Kitchen.. 1 By Mouth Two Times A Day  Allergies (verified): 1)  ! * Shrimp 2)  ! * Zolpidem  Past History:  Past Medical History: Last updated: 10/29/2009 ATRIAL FIBRILLATION (ICD-427.31) HYPERLIPIDEMIA (ICD-272.4) SUPRAVENTRICULAR TACHYCARDIA (ICD-427.89) WEAKNESS (ICD-780.79) DYSPNEA (ICD-786.05) BRONCHITIS, OBSTRUCTIVE CHRONIC, ACUTE EXACERBATION (ICD-491.21) HEADACHE (ICD-784.0) OSTEOPENIA (ICD-733.90) C O P D (ICD-496) ASTHMA (ICD-493.90) ALLERGIC RHINITIS (ICD-477.9)  Past Surgical History: Last  updated: 10/15/2009  catheter ablation of the atrial flutter isthmus in a patient with typical sustained atrial flutter with a total of 25 RF energy applications delivered to a very  thickened atrial flutter isthmus. cataract surgery bilaterally  appendectomy inguinal herniorrhaphy.      Family History: Last updated: 10/15/2009  Significant for his father who had a myocardial    infarction at age 41.  Brother- died COPD Sister- copd Father died-copd, lung cancer Mother- died age 51 old age  Social History: Last updated: 10/15/2009 Patient states former smoker.- heavy, 3 ppd, quit 40 yrs ago Married Product manager plant retired  Risk Factors: Smoking Status: quit (07/04/2010) Packs/Day: 3.0 (07/04/2010)  Vital Signs:  Patient profile:   70 year old male Height:      72 inches Weight:      169 pounds BMI:     23.00 Pulse rate:   96 / minute Pulse (ortho):   0 / minute BP sitting:   109 / 58  (left arm) Cuff size:   regular  Vitals Entered By: Caralee Ates CMA (September 11, 2010 9:37 AM)  Physical Exam  General:  Well developed, well nourished, in no acute distress  wearing oxygen Head:  normal HEENT Lungs:  decreased breath sounds with prolonged expiratory phase Heart:  regular rate and rhythm without murmurs or gallops Abdomen:  soft and nontender with active bowel sounds Msk:  without obvious skeletal defects Extremities:  Without clubbing cyanosis or edema Neurologic:  alert and oriented Skin:  W+D   EKG  Procedure date:  09/11/2010  Findings:      sinus rhythm at 84 Intervals 0.15/115.39 Axis is 75  Impression & Recommendations:  Problem # 1:  ATRIAL FIBRILLATION (ICD-427.31) the patient has atrial fibrillation which is paroxysmal. He has had no significant palpitations. He continues to take Rythmol twice daily. He uses aspirin for thromboembolic risk reduction and is appropriate with his CHADS VASC score of one. His updated medication list for this problem includes:    Ecotrin 325 Mg Tbec (Aspirin) .Marland Kitchen... Take 1 tablet by mouth once a day    Propafenone Hcl 225 Mg Tabs (Propafenone hcl) .Marland Kitchen..Marland Kitchen Two times a day  Problem # 2:  C O P D (ICD-496) He asked about his  risk of undergoing colonoscopy. from a cardiac point of view that this would be minimal. The biggest issue in my mind would be his pulmonary status I have  suggested that they have a conference between his pulmonologist and his gastroenterologist His updated medication list for this problem includes:    Proair Hfa 108 (90 Base) Mcg/act Aers (Albuterol sulfate) ..... Inhale 2 puffs every 4 hours as needed    Ipratropium-albuterol 0.5-2.5 (3) Mg/20ml Soln (Ipratropium-albuterol) .Marland Kitchen... 1 neb four times a day as needed    Symbicort 160-4.5 Mcg/act Aero (Budesonide-formoterol fumarate) .Marland Kitchen... 2 puffs two times a day  Patient Instructions: 1)  Your physician recommends that you continue on your current medications as directed. Please refer to the Current Medication list given to you today. 2)  Your physician wants you to follow-up in: 12 months   You will receive a reminder letter in the mail two months in advance. If you don't receive a letter, please call our office to schedule the follow-up appointment.

## 2010-09-26 NOTE — Assessment & Plan Note (Signed)
Summary: 4 months/ mbw   Primary Provider/Referring Provider:  [name]  CC:  4 month follow up visit-Increased SOB this week. Stayed cold last night-could not warm up.Marland Kitchen  History of Present Illness: December 04, 2009- COPD/ bronchjitis, allergic rhinitis, AF...........Marland Kitchenwife here He notes breathing better with less humidity. Dyspnea limits to walking 10-50 feet level. Stops to rest.  Denies chest pain. Takes diuretic every 2-3 days for edema. Wife often not available and limited by back pain. Home can accomodate wheelchair. He doesn't have exercise tolerance to maneuver his own wheel chair. He gets around home with cane, wheelchair, walker.  February 06, 2010- COPD/ bronchitis, allergic rhinitis, AF.....................Marland Kitchenwife here Felt normal until yesterday morning when he felt chilly. Turned heat on in car doing errand. Temp went up to 103. Stayed in bed. Today myalgias, sharp pain riht axilla. Coughed yellow. Started mucinex. Not more dyspneic. Sore muscles in legs diffusely without swelling. GI ok. No change in bladder- chronic frequency. Intermittent numbness left hand at least a month.  April 05, 2010-COPD/bronchitis/ Chronic hypoxic respiratory failure, allergic rhinitis, AF. Update on mobility status. ..................Marland Kitchenwife here  Had a chill overnight.. Had sinus congestion yesterday with increased dry cough- both better today. Some stomach pains but denies sore throat or dysuria. He is noting some worsening dypnea this week as the weather has gotten milder.  Discussed mobility in home, reviewing and updating my evaluation for a powered mobility device. We went over the points covered previously and clarified several. He says" it takes a lot of energy to move the wheelchair from bathroom to bed and kitchen". Has a cane but it isn't enough help. Walking in home with cane he can go maybe 25-30 feet at most without stopping. He can't do this repetitively, more than 3-4 times/ day, because of fatigue.      Asthma History    Asthma Control Assessment:    Age range: 12+ years    Symptoms: throughout the day    Nighttime Awakenings: 0-2/month    Interferes w/ normal activity: extreme limitations    SABA use (not for EIB): several times per day    Asthma Control Assessment: Very Poorly Controlled   Preventive Screening-Counseling & Management  Alcohol-Tobacco     Smoking Status: quit     Year Started: 1957     Year Quit: 1967  Current Medications (verified): 1)  Proair Hfa 108 (90 Base) Mcg/act  Aers (Albuterol Sulfate) .... Inhale 2 Puffs Every 4 Hours As Needed 2)  Epipen 2-Pak 0.3 Mg/0.70ml (1:1000)  Devi (Epinephrine Hcl (Anaphylaxis)) .... As Needed 3)  Centrum Silver  Tabs (Multiple Vitamins-Minerals) .... Take 1 By Mouth Once Daily 4)  Ipratropium-Albuterol 0.5-2.5 (3) Mg/74ml Soln (Ipratropium-Albuterol) .Marland Kitchen.. 1 Neb Four Times A Day As Needed 5)  Portable Nebulizer For Aerosol Meds 6)  Oxygen 1 L/m Sleep/ Rest, 4 L/m Exertion .... [employer] 7)  Symbicort 160-4.5 Mcg/act Aero (Budesonide-Formoterol Fumarate) .... 2 Puffs Two Times A Day 8)  Power Wheelchair 9)  Tamsulosin Hcl 0.4 Mg Caps (Tamsulosin Hcl) .... Take 1 Tablet By Mouth Once A Day 10)  Tylenol 325 Mg Tabs (Acetaminophen) .... Per Bottle As Needed 11)  Prednisone 20 Mg Tabs (Prednisone) .... Take 1 Tablet By Mouth Once A Day 12)  Ecotrin 325 Mg Tbec (Aspirin) .... Take 1 Tablet By Mouth Once A Day 13)  Ensure  Liqd (Nutritional Supplements) .Marland Kitchen.. 1 Can By Mouth Three Times A Day 14)  Nasal 0.65 % Soln (Saline) .Marland Kitchen.. 1 Spray Each Nostril  Two Times A Day 15)  Verapamil Hcl Cr 240 Mg Cr-Tabs (Verapamil Hcl) .... Take 1 Tablet By Mouth Once A Day 16)  Mucinex 600 Mg Xr12h-Tab (Guaifenesin) .... Take 1-2 Tablets Every 12 Hours As Needed 17)  Propafenone Hcl 225 Mg Tabs (Propafenone Hcl) .... Two Times A Day 18)  One-A-Day Calcium Plus 500-100-50 Mg-Unit-Mg Chew (Calcium-Magnesium-Vitamin D) .Marland Kitchen.. 1 Two Times A Day 19)  Toviaz 4  Mg Xr24h-Tab (Fesoterodine Fumarate) .... Take One Tablet By Mouth Once Daily.  Allergies (verified): 1)  ! * Shrimp 2)  ! 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 cataract surgery bilaterally  appendectomy inguinal herniorrhaphy.      Family History: Last updated: 10/15/2009  Significant for his father who had a myocardial   infarction at age 79.  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  The patient denies productive cough, chest pain, irregular heartbeats, acid heartburn, indigestion, loss of appetite, weight change, abdominal pain, difficulty swallowing, sore throat, tooth/dental problems, headaches, nasal congestion/difficulty breathing through nose, sneezing, itching, rash, change in color of mucus, and fever.    Vital Signs:  Patient profile:   70 year old male Height:      72 inches Weight:      164.13 pounds BMI:     22.34 O2 Sat:      92 % on 3 L/min Pulse  rate:   99 / minute BP sitting:   114 / 62  (left arm) Cuff size:   regular  Vitals Entered By: Reynaldo Minium CMA (April 05, 2010 10:27 AM)  O2 Flow:  3 L/min CC: 4 month follow up visit-Increased SOB this week. Stayed cold last night-could not warm up.   Physical Exam  Additional Exam:  General: A/Ox3 NAD  supplemental oxygen 3L/m 92% sat., wheelchair, chronically ill-appearing SKIN: plethora, steroid echymoses on arms NODES: no lymphadenopathy HEENT: [place]/AT, EOM- WNL, Conjuctivae- clear, PERRLA, TM-WNL, Nose- clear, Throat- clear and wnl NECK: Supple w/ fair ROM, JVD- none,  CHEST: Decreased BS, no wheeze or rhonchi HEART- Reg rate, no m/r/g  ABDOMEN: scaphoid ZOX:WRUE, nl pulses, trace + edema  NEURO: Grossly intact to observation, resting tremor hands      Impression & Recommendations:  Problem # 1:  C O P D (ICD-496) Severe COPD with chronic hypoxic respiratory failure. Chilling last night may be first sign of infection. I will give him an antibiotic to hold. Mobility is significantly limited in the home by his COPD. He has had time to reflect on his needs. I have updated our evaluation of his status and will go ahead now with mobility device prescription.  Problem # 2:  ATRIAL FIBRILLATION (ICD-427.31) Rhythm is sinuso rwith well-controlled ventricular response now. His updated medication list for this problem includes:    Ecotrin 325 Mg Tbec (Aspirin) .Marland Kitchen... Take 1 tablet by mouth once a day    Propafenone Hcl 225 Mg Tabs (Propafenone hcl) .Marland Kitchen..Marland Kitchen Two times a day  Medications Added to Medication List This Visit: 1)  Doxycycline Hyclate 100 Mg Caps (Doxycycline hyclate) .... 2 today then one daily  Other Orders: Est. Patient Level IV (16109)  Patient Instructions: 1)  Please schedule a follow-up appointment in 3 months. 2)  Script for antibiotic sent. 3)  Script documentation for powered mobility device. Prescriptions: DOXYCYCLINE HYCLATE 100 MG CAPS (DOXYCYCLINE  HYCLATE) 2 today then one daily  #8 x 0   Entered and Authorized by:   Waymon Budge MD   Signed by:   Waymon Budge MD on 04/05/2010   Method used:   Electronically to        [facility]  [address] [phone]* (retail)       [address]       [place], [place]  [id number]       Ph: [phone] or [phone]       Fax: [phone]   RxID:   [id number]

## 2010-09-26 NOTE — Progress Notes (Signed)
Summary: URI symptoms, AE-COPD  Phone Note Call from Patient   Caller: Patient Call For: young Summary of Call: URI symptoms and aches beginning Wednesday, dry cough. Some increase wheeze, breathing OK. White sputum. Will rx with pred burst and taper to 20, also azithro Initial call taken by: Leslye Peer MD,  August 24, 2010 8:30 AM    New/Updated Medications: AZITHROMYCIN 250 MG TABS (AZITHROMYCIN) take 2 on first day, then 1 by mouth once daily until gone Prescriptions: AZITHROMYCIN 250 MG TABS (AZITHROMYCIN) take 2 on first day, then 1 by mouth once daily until gone  #6 x 0   Entered and Authorized by:   Leslye Peer MD   Signed by:   Leslye Peer MD on 08/24/2010   Method used:   [facility]        [facility]  [address] [facility] [facility] [address] [phone]* (retail)       [address]       [place], [place]  [id number]       Ph: [phone] or [phone]       Fax: [phone]   RxID:   [id number]

## 2010-09-26 NOTE — Progress Notes (Signed)
Summary: form correction  Phone Note Call from Patient Call back at [phone]  [phone]   Caller: Dennie Maizes Call For: parrett,tammy Reason for Call: Talk to Nurse Summary of Call: with #2 being selected, one of 4-7 needs to be selected as well.  Form is being sent back for an initialand date. Initial call taken by: Eugene Gavia,  October 22, 2009 10:40 AM  Follow-up for Phone Call        form received.  reviewed with TP, signed and initialed.  will fax back to [facility] and send downstairs to be scanned. Follow-up by: Boone Master CNA,  October 23, 2009 9:39 AM  Additional Follow-up for Phone Call Additional follow up Details #1::        faxed to [facility] at number provided on form. Boone Master CNA  October 23, 2009 11:01 AM   called Marcelino Duster, [facility] notifying her that form has been faxed. Boone Master CNA  October 23, 2009 11:03 AM

## 2010-09-26 NOTE — Assessment & Plan Note (Signed)
Summary: 2-3 weeks/apc   Primary Provider/Referring Provider:  [name]  CC:  2-3 week follow up visit-Still SOB with activity.  History of Present Illness: September 20, 2009--Pt presents for a post hosp follow up - states breathing has improved   pt would like a script for a new mattress for his hosp bed at home. Admitted 1/3-1/18/11  for  Atrial fibrillation/flutter status post ablation. 2. Acute on chronic respiratory failure in the setting of severe chronic obstructive pulmonary disease exacerbation  , PNA.  He was admitted to ICU, w/ failure of  BiPAP requirng intubation. Tx w/ aggressive pulmonary hygiene, IV abx, steroids and nebs.  He was sucessfully weaned from vent.  He underwent successful ablation on  September 05, 2009, however, postprocedure did convert back to atrial  fibrillation requiring adjustment with medications per Cardiology to flecainide and Cardizem with transient Coumadin therapy.  Coumadin therapy has been stopped per Cardiology and aspirin recommended for discharge. Since discharge doing better w/ decreased dyspnea. Requests bone density set up.     October 05, 2009 --Presents for 1 month follow up -Recent exacerbation w/ Vent Dependent resp failure 1 month ago. Has had a slow recovery. Continues to have  dyspnea, increased fatigue, cough occ producing white/yellow mucus, temp up to 101, c/s, tigtness in chest x1week -  Cough and congestion are improving over last 2 days but still weak. fett swell in evening, resolves in am. Denies chest pain,  , orthopnea, hemoptysis, fever, n/v/d.   October 29, 2009- COPD/ bronchitis, allergiuc rhinitis, AF...................Marland Kitchenwife here He has seen NP last 2 visits and is due to return for bone density study tomorrow. Working with Dr [name] on AF and had to stop flecainide because of nervousness.  Breathing status sensitive to weather changes. Today he feels close to baseline with minimal cough He takes diuretic off and on as needed for ankle  edema. Staying on prednisone 20 mg daily for now in adition to Symbicort which does help. had bilateral high anterior chest pains last week associated with hard cough. Gas pains treeated with simthacone.   Current Medications (verified): 1)  Fosamax 70 Mg Tabs (Alendronate Sodium) .Marland Kitchen.. 1 By Mouth Weekly 2)  Proair Hfa 108 (90 Base) Mcg/act  Aers (Albuterol Sulfate) .... Inhale 2 Puffs Every 4 Hours As Needed 3)  Epipen 2-Pak 0.3 Mg/0.21ml (1:1000)  Devi (Epinephrine Hcl (Anaphylaxis)) .... As Needed 4)  Centrum Silver  Tabs (Multiple Vitamins-Minerals) .... Take 1 By Mouth Once Daily 5)  Ipratropium-Albuterol 0.5-2.5 (3) Mg/11ml Soln (Ipratropium-Albuterol) .Marland Kitchen.. 1 Neb Four Times A Day As Needed 6)  Portable Nebulizer For Aerosol Meds 7)  Oxygen 1 L/m Sleep/ Rest, 4 L/m Exertion .... [facility] 8)  Symbicort 160-4.5 Mcg/act Aero (Budesonide-Formoterol Fumarate) .... 2 Puffs Two Times A Day 9)  Power Wheelchair 10)  Tamsulosin Hcl 0.4 Mg Caps (Tamsulosin Hcl) .... Take 1 Tablet By Mouth Once A Day 11)  Tylenol 325 Mg Tabs (Acetaminophen) .... Per Bottle As Needed 12)  Prednisone 20 Mg Tabs (Prednisone) .... Take 1 Tablet By Mouth Once A Day 13)  Ecotrin 325 Mg Tbec (Aspirin) .... Take 1 Tablet By Mouth Once A Day 14)  Ensure  Liqd (Nutritional Supplements) .Marland Kitchen.. 1 Can By Mouth Three Times A Day 15)  Flecainide Acetate 100 Mg Tabs (Flecainide Acetate) .... Take 1 Tablet By Mouth Two Times A Day 16)  Nasal 0.65 % Soln (Saline) .Marland Kitchen.. 1 Spray Each Nostril Two Times A Day 17)  Verapamil Hcl Cr 240  Mg Cr-Tabs (Verapamil Hcl) .... Take 1 Tablet By Mouth Once A Day 18)  Mucinex 600 Mg Xr12h-Tab (Guaifenesin) .... Take 1-2 Tablets Every 12 Hours As Needed 19)  Phazyme 180 Mg Caps (Simethicone) .... Per Bottle  Allergies (verified): 1)  ! * Shrimp 2)  ! * Zolpidem  Past History:  Past Surgical History: Last updated: 10/15/2009  catheter ablation of the atrial flutter isthmus in a patient with typical  sustained atrial flutter with a total of 25 RF energy applications delivered to a very  thickened atrial flutter isthmus. cataract surgery bilaterally  appendectomy inguinal herniorrhaphy.      Family History: Last updated: 10/15/2009  Significant for his father who had a myocardial   infarction at age 9.  Brother- died COPD Sister- copd Father died-copd, lung cancer Mother- died age 19 old age  Social History: Last updated: 10/15/2009 Patient states former smoker.- heavy, 3 ppd, quit 40 yrs ago Married Product manager plant retired  Risk Factors: Smoking Status: quit (12/27/2007)  Past Medical History: ATRIAL FIBRILLATION (ICD-427.31) HYPERLIPIDEMIA (ICD-272.4) SUPRAVENTRICULAR TACHYCARDIA (ICD-427.89) WEAKNESS (ICD-780.79) DYSPNEA (ICD-786.05) BRONCHITIS, OBSTRUCTIVE CHRONIC, ACUTE EXACERBATION (ICD-491.21) HEADACHE (ICD-784.0) OSTEOPENIA (ICD-733.90) C O P D (ICD-496) ASTHMA (ICD-493.90) ALLERGIC RHINITIS (ICD-477.9)  Review of Systems      See HPI       The patient complains of dyspnea on exertion, peripheral edema, and prolonged cough.  The patient denies anorexia, fever, weight loss, weight gain, vision loss, decreased hearing, hoarseness, chest pain, syncope, headaches, hemoptysis, abdominal pain, and severe indigestion/heartburn.    Vital Signs:  Patient profile:   70 year old male Height:      72 inches Weight:      158.38 pounds BMI:     21.56 O2 Sat:      92 % on 2 L/min Pulse rate:   57 / minute BP sitting:   110 / 62  (left arm) Cuff size:   regular  Vitals Entered By: Reynaldo Minium CMA (October 29, 2009 3:51 PM)  O2 Flow:  2 L/min  Physical Exam  Additional Exam:  General: A/Ox3 NAD  supplemental oxygen 2L/m 92% sat. SKIN: plethora, steroid echymoses on arms NODES: no lymphadenopathy HEENT: [place]/AT, EOM- WNL, Conjuctivae- clear, PERRLA, TM-WNL, Nose- clear, Throat- clear and wnl NECK: Supple w/ fair ROM, JVD- none,  CHEST:  Decreased BS in bases., Coarse BS w/ scattered rhonchi HEART- Reg rate, no m/r/g  ABDOMEN: scaphoid ZOX:WRUE, nl pulses, 1 + edema  NEURO: Grossly intact to observation, resting tremor hands      Impression & Recommendations:  Problem # 1:  C O P D (ICD-496) Very severe COPD, oxygen dependent. He can walk little around his home. He is asking for a power wheelchair, saying on his own he has to stop repeatedly to rest in manual chair. Family is often not able or available to assist. He remains steroid dependent and we have been concerned. Bone density check is pending.  Problem # 2:  ATRIAL FIBRILLATION (ICD-427.31)  He will f/u with Dr [name], noting he stopped taking flecainide. VRR today is ok at 57/min. His updated medication list for this problem includes:    Ecotrin 325 Mg Tbec (Aspirin) .Marland Kitchen... Take 1 tablet by mouth once a day    Propafenone Hcl 225 Mg Tabs (Propafenone hcl) .Marland Kitchen..Marland Kitchen Two times a day  Other Orders: Est. Patient Level III (45409)  Patient Instructions: 1)  Please schedule a follow-up appointment in 4 months. 2)  Call sooner as  needed 3)  OK to look again at pulmonary rehab- consider visiting a class Prescriptions: POWER WHEELCHAIR   #1 x prn   Entered and Authorized by:   Waymon Budge MD   Signed by:   Waymon Budge MD on 10/29/2009   Method used:   Print then Give to Patient   RxID:   [id number]

## 2010-09-26 NOTE — Progress Notes (Signed)
  Phone Note Other Incoming   Request: Send information Summary of Call: Request received from [facility] forwarded to [employer].

## 2010-09-26 NOTE — Letter (Signed)
Summary: CMN for Nebulizer Supplies/[facility]  CMN for Nebulizer Supplies/[facility]   Imported By: Sherian Rein 01/17/2010 14:41:50  _____________________________________________________________________  External Attachment:    Type:   Image     Comment:   External Document

## 2010-09-26 NOTE — Letter (Signed)
Summary: CMN for Oxygen/[facility]  CMN for Oxygen/[facility]   Imported By: Sherian Rein 08/28/2010 11:13:29  _____________________________________________________________________  External Attachment:    Type:   Image     Comment:   External Document

## 2010-09-26 NOTE — Assessment & Plan Note (Signed)
Summary: Mark [name]/post ablation      Allergies Added:   Visit Type:  Follow-up Primary Provider:  [name]  CC:  pt states he is getting better.  History of Present Illness: Mr. Mark [name] is seen in followup for atrial flutter status post ablation undertaken by Dr. Ladona Ridgel back in January. This occurred in the context of COPD for which he is oxygen dependent. Reviewing the records he had postoperative atrial fibrillation and flecainide therapy was initiated.  He says he is much better since then with gradually improving strength and energy.  However, there has been an issue of "nervousness" manifested by tremors and the inability to write his name. He wonders whether this is related to his medication.  Current Medications (verified): 1)  Fosamax 70 Mg Tabs (Alendronate Sodium) .Marland Kitchen.. 1 By Mouth Weekly 2)  Proair Hfa 108 (90 Base) Mcg/act  Aers (Albuterol Sulfate) .... Inhale 2 Puffs Every 4 Hours As Needed 3)  Epipen 2-Pak 0.3 Mg/0.83ml (1:1000)  Devi (Epinephrine Hcl (Anaphylaxis)) .... As Needed 4)  Centrum Silver  Tabs (Multiple Vitamins-Minerals) .... Take 1 By Mouth Once Daily 5)  Ipratropium-Albuterol 0.5-2.5 (3) Mg/16ml Soln (Ipratropium-Albuterol) .Marland Kitchen.. 1 Neb Four Times A Day As Needed 6)  Portable Nebulizer For Aerosol Meds 7)  Oxygen 1 L/m Sleep/ Rest, 4 L/m Exertion .... [facility] 8)  Symbicort 160-4.5 Mcg/act Aero (Budesonide-Formoterol Fumarate) .... 2 Puffs Two Times A Day 9)  Wheelchair, Adult, Folding, With Foot Rests 10)  Power Wheelchair 11)  Tamsulosin Hcl 0.4 Mg Caps (Tamsulosin Hcl) .... Take 1 Tablet By Mouth Once A Day 12)  Tylenol 325 Mg Tabs (Acetaminophen) .... Per Bottle As Needed 13)  Prednisone 20 Mg Tabs (Prednisone) .... Take 1 Tablet By Mouth Once A Day 14)  Ecotrin 325 Mg Tbec (Aspirin) .... Take 1 Tablet By Mouth Once A Day 15)  Ensure  Liqd (Nutritional Supplements) .Marland Kitchen.. 1 Can By Mouth Three Times A Day 16)  Flecainide Acetate 100 Mg Tabs (Flecainide Acetate) ....  Take 1 Tablet By Mouth Two Times A Day 17)  Nasal 0.65 % Soln (Saline) .Marland Kitchen.. 1 Spray Each Nostril Two Times A Day 18)  Verapamil Hcl Cr 240 Mg Cr-Tabs (Verapamil Hcl) .... Take 1 Tablet By Mouth Once A Day 19)  Mucinex 600 Mg Xr12h-Tab (Guaifenesin) .... Take 1-2 Tablets Every 12 Hours As Needed 20)  Phazyme 180 Mg Caps (Simethicone) .... Per Bottle  Allergies (verified): 1)  ! * Shrimp 2)  ! * Zolpidem  Past History:  Past Medical History: Last updated: 10/15/2009 Current Problems:  ATRIAL FIBRILLATION (ICD-427.31) HYPERLIPIDEMIA (ICD-272.4) SUPRAVENTRICULAR TACHYCARDIA (ICD-427.89) WEAKNESS (ICD-780.79) DYSPNEA (ICD-786.05) BRONCHITIS, OBSTRUCTIVE CHRONIC, ACUTE EXACERBATION (ICD-491.21) HEADACHE (ICD-784.0) OSTEOPENIA (ICD-733.90) C O P D (ICD-496) ASTHMA (ICD-493.90) ALLERGIC RHINITIS (ICD-477.9)  Past Surgical History: Last updated: 10/15/2009  catheter ablation of the atrial flutter isthmus in a patient with typical sustained atrial flutter with a total of 25 RF energy applications delivered to a very  thickened atrial flutter isthmus. cataract surgery bilaterally  appendectomy inguinal herniorrhaphy.      Family History: Last updated: 10/15/2009  Significant for his father who had a myocardial   infarction at age 62.  Brother- died COPD Sister- copd Father died-copd, lung cancer Mother- died age 24 old age  Social History: Last updated: 10/15/2009 Patient states former smoker.- heavy, 3 ppd, quit 40 yrs ago Married Product manager plant retired  Risk Factors: Smoking Status: quit (12/27/2007)  Vital Signs:  Patient profile:   70 year old male  Height:      72 inches Weight:      152 pounds Pulse rate:   97 / minute BP sitting:   108 / 61  (left arm) Cuff size:   regular  Vitals Entered By: Burnett Kanaris, CNA (October 16, 2009 11:17 AM)  Physical Exam  General:  The patient was alert and oriented in mild respiratory distress with  nasal cannula in place. HEENT Normal.  Neck veins were flat, carotids were brisk.  Lungs were marked decreased breath Heart sounds were regular without murmurs or gallops.  Abdomen was soft with active bowel sounds. There is no clubbing cyanosis or edema. Skin Warm and dry mild rintentiomn  tremor is noted     EKG  Procedure date:  10/16/2009  Findings:      sinus rhythm at 93 Intervals 0.15/0.11/0.36 Axis is 103 Poor R-wave progression Nonspecific ST-T changes  Impression & Recommendations:  Problem # 1:  TREMOR (ICD-781.0) It may be that his tremor is related to his flecainide therapy. We will undertake a one-week exclusion trial to see if there is any improvement in his tremor with the discontinuation of his medication. He will let us know next week. In the event that it resolves, we'll have to make a decision as to whether we try Rythmol with its beta blocker effects or whether we wait and see if he has recurrent symptomatic arrhythmia.  Problem # 2:  ATRIAL FIBRILLATION (ICD-427.31) CV above. It is felt that he is not a candidate for Coumadin. He'll be continued on high-dose aspirin His updated medication list for this problem includes:    Ecotrin 325 Mg Tbec (Aspirin) .Marland Kitchen... Take 1 tablet by mouth once a day    Flecainide Acetate 100 Mg Tabs (Flecainide acetate) .Marland Kitchen... Take 1 tablet by mouth two times a day  Patient Instructions: 1)  Your physician recommends that you schedule a follow-up appointment in: 3  months 2)  Will stop flecanide for 1 week and let us know how he is doing from a tremor point of view

## 2010-09-26 NOTE — Miscellaneous (Signed)
Summary: Plan of Care & Treatment/[facility]  Plan of Care & Treatment/[facility]   Imported By: Sherian Rein 10/09/2009 15:10:52  _____________________________________________________________________  External Attachment:    Type:   Image     Comment:   External Document

## 2010-09-26 NOTE — Assessment & Plan Note (Signed)
Summary: rov 3 months///kp   Primary Provider/Referring Provider:  [name]  CC:  3 month follow up visit-COPD.  History of Present Illness:  April 05, 2010-COPD/bronchitis/ Chronic hypoxic respiratory failure, allergic rhinitis, AF. Update on mobility status. ..................Mark [name] here  Had a chill overnight.. Had sinus congestion yesterday with increased dry cough- both better today. Some stomach pains but denies sore throat or dysuria. He is noting some worsening dypnea this week as the weather has gotten milder.  Discussed mobility in home, reviewing and updating my evaluation for a powered mobility device. We went over the points covered previously and clarified several. He says" it takes a lot of energy to move the wheelchair from bathroom to bed and kitchen". Has a cane but it isn't enough help. Walking in home with cane he can go maybe 25-30 feet at most without stopping. He can't do this repetitively, more than 3-4 times/ day, because of fatigue.   July 04, 2010- April 05, 2010-COPD/bronchitis/ Chronic hypoxic respiratory failure, allergic rhinitis, AF. Update on mobility status. ..................Mark [name] here Nurse-CC: 3 month follow up visit-COPD Continues O2 dependent continuous and portatble.Mark Kitchen Keeps humidity up around 65% in his room. Uses neb 3-4x/day.   Continues prednisone 20 mg daily- can't breathe comfortably with less than that. Denies productive cough as long as he takes mucinex regularly. Had flu shot. Can't remember pneumovax. Had a long discussion about alternative oxygen supply devices.      Preventive Screening-Counseling & Management  Alcohol-Tobacco     Smoking Status: quit     Packs/Day: 3.0     Year Started: 1957     Year Quit: 1967  Current Medications (verified): 1)  Proair Hfa 108 (90 Base) Mcg/act  Aers (Albuterol Sulfate) .... Inhale 2 Puffs Every 4 Hours As Needed 2)  Epipen 2-Pak 0.3 Mg/0.21ml (1:1000)  Devi (Epinephrine Hcl (Anaphylaxis)) .... As  Needed 3)  Centrum Silver  Tabs (Multiple Vitamins-Minerals) .... Take 1 By Mouth Once Daily 4)  Ipratropium-Albuterol 0.5-2.5 (3) Mg/66ml Soln (Ipratropium-Albuterol) .Mark Kitchen.. 1 Neb Four Times A Day As Needed 5)  Portable Nebulizer For Aerosol Meds 6)  Oxygen 1 L/m Sleep/ Rest, 4 L/m Exertion .... [facility] 7)  Symbicort 160-4.5 Mcg/act Aero (Budesonide-Formoterol Fumarate) .... 2 Puffs Two Times A Day 8)  Power Wheelchair 9)  Tamsulosin Hcl 0.4 Mg Caps (Tamsulosin Hcl) .... Take 1 Tablet By Mouth Once A Day 10)  Tylenol 325 Mg Tabs (Acetaminophen) .... Per Bottle As Needed 11)  Prednisone 20 Mg Tabs (Prednisone) .... Take 1 Tablet By Mouth Once A Day 12)  Ecotrin 325 Mg Tbec (Aspirin) .... Take 1 Tablet By Mouth Once A Day 13)  Ensure  Liqd (Nutritional Supplements) .Mark Kitchen.. 1 Can By Mouth Three Times A Day 14)  Nasal 0.65 % Soln (Saline) .Mark Kitchen.. 1 Spray Each Nostril Two Times A Day 15)  Verapamil Hcl Cr 240 Mg Cr-Tabs (Verapamil Hcl) .... Take 1 Tablet By Mouth Once A Day 16)  Mucinex 600 Mg Xr12h-Tab (Guaifenesin) .... Take 1-2 Tablets Every 12 Hours As Needed 17)  Propafenone Hcl 225 Mg Tabs (Propafenone Hcl) .... Two Times A Day 18)  One-A-Day Calcium Plus 500-100-50 Mg-Unit-Mg Chew (Calcium-Magnesium-Vitamin D) .Mark Kitchen.. 1 Two Times A Day 19)  Toviaz 4 Mg Xr24h-Tab (Fesoterodine Fumarate) .... Take One Tablet By Mouth Once Daily.  Allergies (verified): 1)  ! * Shrimp 2)  ! * Zolpidem  Past History:  Past Medical History: Last updated: 10/29/2009 ATRIAL FIBRILLATION (ICD-427.31) HYPERLIPIDEMIA (ICD-272.4) SUPRAVENTRICULAR TACHYCARDIA (ICD-427.89) WEAKNESS (ICD-780.79) DYSPNEA (  ICD-786.05) BRONCHITIS, OBSTRUCTIVE CHRONIC, ACUTE EXACERBATION (ICD-491.21) HEADACHE (ICD-784.0) OSTEOPENIA (ICD-733.90) C O P D (ICD-496) ASTHMA (ICD-493.90) ALLERGIC RHINITIS (ICD-477.9)  Past Surgical History: Last updated: 10/15/2009  catheter ablation of the atrial flutter isthmus in a patient with typical  sustained atrial flutter with a total of 25 RF energy applications delivered to a very  thickened atrial flutter isthmus. cataract surgery bilaterally  appendectomy inguinal herniorrhaphy.      Family History: Last updated: 10/15/2009  Significant for his father who had a myocardial   infarction at age 30.  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  The patient denies productive cough, non-productive cough, coughing up blood, chest pain, irregular heartbeats, acid heartburn, indigestion, loss of appetite, weight change, abdominal pain, difficulty swallowing, sore throat, tooth/dental problems, headaches, nasal congestion/difficulty breathing through nose, sneezing, itching, ear ache, rash, change in color of mucus, and fever.    Vital Signs:  Patient profile:   70 year old male Height:      72 inches Weight:      171.50 pounds BMI:     23.34 O2 Sat:      91 % on 1.5 L/min Pulse rate:   95 / minute BP sitting:   138 / 74  (left arm) Cuff size:   regular  Vitals Entered By: Reynaldo Minium CMA (July 04, 2010 9:58 AM)  O2 Flow:  1.5 L/min CC: 3 month follow up visit-COPD   Physical Exam  Additional Exam:  General: A/Ox3 NAD  supplemental oxygen 3L/m 92% sat., wheelchair, chronically ill-appearing. On arrival O2 sat 85% with exertion of removing his coat (on 1.5L), back up to 9!% on 1.5L at rest- Continuous portable.  SKIN: plethora, steroid echymoses on arms NODES: no lymphadenopathy HEENT: [place]/AT, EOM- WNL, Conjuctivae- clear,  PERRLA, TM-WNL, Nose- clear, Throat- clear and wnl NECK: Supple w/ fair ROM, JVD- none,  CHEST: Decreased BS, no wheeze or rhonchi HEART- Reg rate, no m/r/g  ABDOMEN: scaphoid JXB:JYNW, nl pulses, trace + edema  NEURO: Grossly intact to observation, resting tremor hands      Impression & Recommendations:  Problem # 1:  C O P D (ICD-496) Stable,  but steroid and oxygen dependent chronic respiratory failure. No changes to offer. Long discussion of oxygen and lung transplant issues.   Problem # 2:  ATRIAL FIBRILLATION (ICD-427.31)  Feels lke sinus rhytm now on current meds. He doesn't feel palpitation.  His updated medication list for this problem includes:    Ecotrin 325 Mg Tbec (Aspirin) .Mark Kitchen... Take 1 tablet by mouth once a day    Propafenone Hcl 225 Mg Tabs (Propafenone hcl) .Mark Kitchen..Mark Kitchen Two times a day  Other Orders: Est. Patient Level IV (29562) Prescription Created Electronically [phone]) Pneumococcal Vaccine (57846) Admin 1st Vaccine (96295)  Patient Instructions: 1)  Please schedule a follow-up appointment in 4 months. 2)  continue present meds .  3)  You continue to need oxygen. You can discuss different delivery devices with [facility] to see if something other than your liquid oxygen system would work better  for you.  4)  Pneumovax. 5)  refill script proair Prescriptions: PROAIR HFA 108 (90 BASE) MCG/ACT  AERS (ALBUTEROL SULFATE) Inhale 2 puffs every 4 hours as needed  #1 x prn   Entered and Authorized by:   Waymon Budge MD   Signed by:   Waymon Budge MD on 07/04/2010   Method used:   Electronically to        [facility] [phone]* (retail)       [address]       [place], [place]  [id number]       Ph: [phone] or [phone]       Fax: [phone]   RxID:   [id number]    Immunizations Administered:  Pneumonia Vaccine:    Vaccine Type: Pneumovax (Medicare)    Site: left deltoid    Mfr: Merck    Dose: 0.5 ml     Route: IM    Given by: Reynaldo Minium CMA    Exp. Date: 12/20/2011    Lot #: [id number]    VIS given: 07/30/09 version given July 04, 2010.   Immunizations Administered:  Pneumonia Vaccine:    Vaccine Type: Pneumovax (Medicare)    Site: left deltoid    Mfr: Merck    Dose: 0.5 ml    Route: IM    Given by: Reynaldo Minium CMA    Exp. Date: 12/20/2011    Lot #: [id number]    VIS given: 07/30/09 version given July 04, 2010.

## 2010-09-26 NOTE — Letter (Signed)
Summary: Mark [name] W/Wheels / [facility]  Walker W/Wheels / [facility]   Imported By: Lennie Odor 12/13/2009 15:58:08  _____________________________________________________________________  External Attachment:    Type:   Image     Comment:   External Document

## 2010-09-26 NOTE — Letter (Signed)
Summary: [facility]  [facility]   Imported By: Lester [place] 08/02/2010 11:09:12  _____________________________________________________________________  External Attachment:    Type:   Image     Comment:   External Document

## 2010-11-04 ENCOUNTER — Ambulatory Visit (INDEPENDENT_AMBULATORY_CARE_PROVIDER_SITE_OTHER): Payer: Medicare Other | Admitting: Internal Medicine

## 2010-11-04 ENCOUNTER — Encounter: Payer: Self-pay | Admitting: Internal Medicine

## 2010-11-04 DIAGNOSIS — J449 Chronic obstructive pulmonary disease, unspecified: Secondary | ICD-10-CM

## 2010-11-04 DIAGNOSIS — J309 Allergic rhinitis, unspecified: Secondary | ICD-10-CM

## 2010-11-04 DIAGNOSIS — J441 Chronic obstructive pulmonary disease with (acute) exacerbation: Secondary | ICD-10-CM

## 2010-11-04 DIAGNOSIS — J4489 Other specified chronic obstructive pulmonary disease: Secondary | ICD-10-CM

## 2010-11-04 DIAGNOSIS — I4891 Unspecified atrial fibrillation: Secondary | ICD-10-CM

## 2010-11-10 LAB — COMPREHENSIVE METABOLIC PANEL
ALT: 64 U/L — ABNORMAL HIGH (ref 0–53)
AST: 34 U/L (ref 0–37)
Albumin: 2.6 g/dL — ABNORMAL LOW (ref 3.5–5.2)
Albumin: 3.5 g/dL (ref 3.5–5.2)
Alkaline Phosphatase: 43 U/L (ref 39–117)
BUN: 20 mg/dL (ref 6–23)
BUN: 28 mg/dL — ABNORMAL HIGH (ref 6–23)
CO2: 23 mEq/L (ref 19–32)
Calcium: 8.3 mg/dL — ABNORMAL LOW (ref 8.4–10.5)
Chloride: 104 mEq/L (ref 96–112)
Chloride: 107 mEq/L (ref 96–112)
Creatinine, Ser: 0.78 mg/dL (ref 0.4–1.5)
Creatinine, Ser: 1.22 mg/dL (ref 0.4–1.5)
GFR calc Af Amer: >60 mL/min (ref >60)
GFR calc non Af Amer: >60 mL/min (ref >60)
Glucose, Bld: 114 mg/dL — ABNORMAL HIGH (ref 70–99)
Potassium: 3.9 mEq/L (ref 3.5–5.1)
Sodium: 138 mEq/L (ref 135–145)
Total Bilirubin: 1.1 mg/dL (ref 0.3–1.2)
Total Bilirubin: 1.4 mg/dL — ABNORMAL HIGH (ref 0.3–1.2)
Total Protein: 5.9 g/dL — ABNORMAL LOW (ref 6.0–8.3)
Total Protein: 7.2 g/dL (ref 6.0–8.3)

## 2010-11-10 LAB — CBC
HCT: 34.1 % — ABNORMAL LOW (ref 39.0–52.0)
HCT: 34.7 % — ABNORMAL LOW (ref 39.0–52.0)
HCT: 35.5 % — ABNORMAL LOW (ref 39.0–52.0)
HCT: 40.7 % (ref 39.0–52.0)
HCT: 42 % (ref 39.0–52.0)
HCT: 32.3 % — ABNORMAL LOW (ref 39.0–52.0)
HCT: 35 % — ABNORMAL LOW (ref 39.0–52.0)
Hemoglobin: 11.6 g/dL — ABNORMAL LOW (ref 13.0–17.0)
Hemoglobin: 13.8 g/dL (ref 13.0–17.0)
Hemoglobin: 14.3 g/dL (ref 13.0–17.0)
Hemoglobin: 11 g/dL — ABNORMAL LOW (ref 13.0–17.0)
MCHC: 33.1 g/dL (ref 30.0–36.0)
MCHC: 33.3 g/dL (ref 30.0–36.0)
MCHC: 33.3 g/dL (ref 30.0–36.0)
MCHC: 33.6 g/dL (ref 30.0–36.0)
MCHC: 33.9 g/dL (ref 30.0–36.0)
MCHC: 34 g/dL (ref 30.0–36.0)
MCHC: 34 g/dL (ref 30.0–36.0)
MCHC: 34.1 g/dL (ref 30.0–36.0)
MCHC: 34.1 g/dL (ref 30.0–36.0)
MCV: 100 fL (ref 78.0–100.0)
MCV: 100.4 fL — ABNORMAL HIGH (ref 78.0–100.0)
MCV: 100.6 fL — ABNORMAL HIGH (ref 78.0–100.0)
MCV: 101.6 fL — ABNORMAL HIGH (ref 78.0–100.0)
MCV: 99.4 fL (ref 78.0–100.0)
MCV: 99.6 fL (ref 78.0–100.0)
MCV: 99.7 fL (ref 78.0–100.0)
MCV: 99.8 fL (ref 78.0–100.0)
MCV: 99.9 fL (ref 78.0–100.0)
MCV: 98.7 fL (ref 78.0–100.0)
MCV: 99.1 fL (ref 78.0–100.0)
Platelets: 214 10*3/uL (ref 150–400)
Platelets: 223 10*3/uL (ref 150–400)
Platelets: 224 10*3/uL (ref 150–400)
Platelets: 226 10*3/uL (ref 150–400)
Platelets: 227 10*3/uL (ref 150–400)
Platelets: 231 10*3/uL (ref 150–400)
Platelets: 232 10*3/uL (ref 150–400)
Platelets: 223 10*3/uL (ref 150–400)
Platelets: 225 10*3/uL (ref 150–400)
RBC: 3.41 MIL/uL — ABNORMAL LOW (ref 4.22–5.81)
RBC: 3.48 MIL/uL — ABNORMAL LOW (ref 4.22–5.81)
RBC: 3.5 MIL/uL — ABNORMAL LOW (ref 4.22–5.81)
RBC: 3.53 MIL/uL — ABNORMAL LOW (ref 4.22–5.81)
RBC: 3.65 MIL/uL — ABNORMAL LOW (ref 4.22–5.81)
RBC: 3.93 MIL/uL — ABNORMAL LOW (ref 4.22–5.81)
RBC: 4.21 MIL/uL — ABNORMAL LOW (ref 4.22–5.81)
RBC: 4.22 MIL/uL (ref 4.22–5.81)
RBC: 3.28 MIL/uL — ABNORMAL LOW (ref 4.22–5.81)
RBC: 3.45 MIL/uL — ABNORMAL LOW (ref 4.22–5.81)
RDW: 14.8 % (ref 11.5–15.5)
RDW: 14.9 % (ref 11.5–15.5)
RDW: 15.2 % (ref 11.5–15.5)
RDW: 15 % (ref 11.5–15.5)
WBC: 12 10*3/uL — ABNORMAL HIGH (ref 4.0–10.5)
WBC: 12.8 10*3/uL — ABNORMAL HIGH (ref 4.0–10.5)
WBC: 13.5 10*3/uL — ABNORMAL HIGH (ref 4.0–10.5)
WBC: 15.1 10*3/uL — ABNORMAL HIGH (ref 4.0–10.5)
WBC: 15.8 10*3/uL — ABNORMAL HIGH (ref 4.0–10.5)
WBC: 16.2 10*3/uL — ABNORMAL HIGH (ref 4.0–10.5)
WBC: 17.7 10*3/uL — ABNORMAL HIGH (ref 4.0–10.5)
WBC: 16.6 10*3/uL — ABNORMAL HIGH (ref 4.0–10.5)
WBC: 16.8 10*3/uL — ABNORMAL HIGH (ref 4.0–10.5)
WBC: 17 10*3/uL — ABNORMAL HIGH (ref 4.0–10.5)

## 2010-11-10 LAB — BASIC METABOLIC PANEL
BUN: 10 mg/dL (ref 6–23)
BUN: 22 mg/dL (ref 6–23)
BUN: 28 mg/dL — ABNORMAL HIGH (ref 6–23)
BUN: 29 mg/dL — ABNORMAL HIGH (ref 6–23)
BUN: 33 mg/dL — ABNORMAL HIGH (ref 6–23)
BUN: 8 mg/dL (ref 6–23)
BUN: 9 mg/dL (ref 6–23)
BUN: 5 mg/dL — ABNORMAL LOW (ref 6–23)
CO2: 23 mEq/L (ref 19–32)
CO2: 25 mEq/L (ref 19–32)
CO2: 31 mEq/L (ref 19–32)
CO2: 33 mEq/L — ABNORMAL HIGH (ref 19–32)
CO2: 36 mEq/L — ABNORMAL HIGH (ref 19–32)
Calcium: 7 mg/dL — ABNORMAL LOW (ref 8.4–10.5)
Calcium: 8 mg/dL — ABNORMAL LOW (ref 8.4–10.5)
Calcium: 8 mg/dL — ABNORMAL LOW (ref 8.4–10.5)
Calcium: 8 mg/dL — ABNORMAL LOW (ref 8.4–10.5)
Calcium: 8.1 mg/dL — ABNORMAL LOW (ref 8.4–10.5)
Calcium: 8.5 mg/dL (ref 8.4–10.5)
Calcium: 8.7 mg/dL (ref 8.4–10.5)
Chloride: 102 mEq/L (ref 96–112)
Chloride: 103 mEq/L (ref 96–112)
Chloride: 106 mEq/L (ref 96–112)
Chloride: 107 mEq/L (ref 96–112)
Chloride: 99 mEq/L (ref 96–112)
Creatinine, Ser: 0.63 mg/dL (ref 0.4–1.5)
Creatinine, Ser: 0.7 mg/dL (ref 0.4–1.5)
Creatinine, Ser: 0.71 mg/dL (ref 0.4–1.5)
Creatinine, Ser: 0.77 mg/dL (ref 0.4–1.5)
Creatinine, Ser: 0.79 mg/dL (ref 0.4–1.5)
Creatinine, Ser: 0.83 mg/dL (ref 0.4–1.5)
Creatinine, Ser: 0.91 mg/dL (ref 0.4–1.5)
Creatinine, Ser: 0.99 mg/dL (ref 0.4–1.5)
Creatinine, Ser: 1.06 mg/dL (ref 0.4–1.5)
Creatinine, Ser: 1.32 mg/dL (ref 0.4–1.5)
GFR calc Af Amer: >60 mL/min (ref >60)
GFR calc Af Amer: >60 mL/min (ref >60)
GFR calc Af Amer: >60 mL/min (ref >60)
GFR calc Af Amer: >60 mL/min (ref >60)
GFR calc Af Amer: >60 mL/min (ref >60)
GFR calc Af Amer: >60 mL/min (ref >60)
GFR calc non Af Amer: >60 mL/min (ref >60)
GFR calc non Af Amer: >60 mL/min (ref >60)
GFR calc non Af Amer: >60 mL/min (ref >60)
GFR calc non Af Amer: >60 mL/min (ref >60)
GFR calc non Af Amer: >60 mL/min (ref >60)
GFR calc non Af Amer: >60 mL/min (ref >60)
GFR calc non Af Amer: >60 mL/min (ref >60)
GFR calc non Af Amer: >60 mL/min (ref >60)
GFR calc non Af Amer: >60 mL/min (ref >60)
Glucose, Bld: 166 mg/dL — ABNORMAL HIGH (ref 70–99)
Glucose, Bld: 184 mg/dL — ABNORMAL HIGH (ref 70–99)
Glucose, Bld: 187 mg/dL — ABNORMAL HIGH (ref 70–99)
Glucose, Bld: 283 mg/dL — ABNORMAL HIGH (ref 70–99)
Glucose, Bld: 94 mg/dL (ref 70–99)
Glucose, Bld: 94 mg/dL (ref 70–99)
Glucose, Bld: 96 mg/dL (ref 70–99)
Potassium: 3 mEq/L — ABNORMAL LOW (ref 3.5–5.1)
Potassium: 3.5 mEq/L (ref 3.5–5.1)
Potassium: 3.6 mEq/L (ref 3.5–5.1)
Potassium: 3.7 mEq/L (ref 3.5–5.1)
Potassium: 3.5 mEq/L (ref 3.5–5.1)
Potassium: 4.2 mEq/L (ref 3.5–5.1)
Sodium: 145 mEq/L (ref 135–145)
Sodium: 146 mEq/L — ABNORMAL HIGH (ref 135–145)
Sodium: 147 mEq/L — ABNORMAL HIGH (ref 135–145)
Sodium: 138 mEq/L (ref 135–145)

## 2010-11-10 LAB — POCT I-STAT 3, ART BLOOD GAS (G3+)
Acid-Base Excess: 3 mmol/L — ABNORMAL HIGH (ref 0.0–2.0)
Acid-Base Excess: 6 mmol/L — ABNORMAL HIGH (ref 0.0–2.0)
Acid-base deficit: 1 mmol/L (ref 0.0–2.0)
Bicarbonate: 23.6 mEq/L (ref 20.0–24.0)
Bicarbonate: 23.9 meq/L (ref 20.0–24.0)
Bicarbonate: 24.4 mEq/L — ABNORMAL HIGH (ref 20.0–24.0)
Bicarbonate: 26.1 meq/L — ABNORMAL HIGH (ref 20.0–24.0)
Bicarbonate: 27.3 meq/L — ABNORMAL HIGH (ref 20.0–24.0)
Bicarbonate: 29.9 meq/L — ABNORMAL HIGH (ref 20.0–24.0)
O2 Saturation: 100 %
O2 Saturation: 95 %
O2 Saturation: 96 %
O2 Saturation: 96 %
O2 Saturation: 97 %
O2 Saturation: 99 %
Patient temperature: 97.3
Patient temperature: 97.5
Patient temperature: 98
TCO2: 25 mmol/L (ref 0–100)
TCO2: 25 mmol/L (ref 0–100)
TCO2: 26 mmol/L (ref 0–100)
TCO2: 28 mmol/L (ref 0–100)
TCO2: 29 mmol/L (ref 0–100)
TCO2: 31 mmol/L (ref 0–100)
pCO2 arterial: 35.6 mmHg (ref 35.0–45.0)
pCO2 arterial: 37 mmHg (ref 35.0–45.0)
pCO2 arterial: 39.2 mmHg (ref 35.0–45.0)
pCO2 arterial: 39.3 mmHg (ref 35.0–45.0)
pCO2 arterial: 40.5 mmHg (ref 35.0–45.0)
pCO2 arterial: 46.9 mmHg — ABNORMAL HIGH (ref 35.0–45.0)
pH, Arterial: 7.352 (ref 7.350–7.450)
pH, Arterial: 7.403 (ref 7.350–7.450)
pH, Arterial: 7.412 (ref 7.350–7.450)
pH, Arterial: 7.432 (ref 7.350–7.450)
pH, Arterial: 7.437 (ref 7.350–7.450)
pH, Arterial: 7.487 — ABNORMAL HIGH (ref 7.350–7.450)
pO2, Arterial: 131 mmHg — ABNORMAL HIGH (ref 80.0–100.0)
pO2, Arterial: 177 mmHg — ABNORMAL HIGH (ref 80.0–100.0)
pO2, Arterial: 70 mmHg — ABNORMAL LOW (ref 80.0–100.0)
pO2, Arterial: 75 mmHg — ABNORMAL LOW (ref 80.0–100.0)
pO2, Arterial: 76 mmHg — ABNORMAL LOW (ref 80.0–100.0)
pO2, Arterial: 92 mmHg (ref 80.0–100.0)

## 2010-11-10 LAB — GLUCOSE, CAPILLARY
Glucose-Capillary: 101 mg/dL — ABNORMAL HIGH (ref 70–99)
Glucose-Capillary: 104 mg/dL — ABNORMAL HIGH (ref 70–99)
Glucose-Capillary: 107 mg/dL — ABNORMAL HIGH (ref 70–99)
Glucose-Capillary: 112 mg/dL — ABNORMAL HIGH (ref 70–99)
Glucose-Capillary: 112 mg/dL — ABNORMAL HIGH (ref 70–99)
Glucose-Capillary: 113 mg/dL — ABNORMAL HIGH (ref 70–99)
Glucose-Capillary: 113 mg/dL — ABNORMAL HIGH (ref 70–99)
Glucose-Capillary: 116 mg/dL — ABNORMAL HIGH (ref 70–99)
Glucose-Capillary: 116 mg/dL — ABNORMAL HIGH (ref 70–99)
Glucose-Capillary: 118 mg/dL — ABNORMAL HIGH (ref 70–99)
Glucose-Capillary: 119 mg/dL — ABNORMAL HIGH (ref 70–99)
Glucose-Capillary: 121 mg/dL — ABNORMAL HIGH (ref 70–99)
Glucose-Capillary: 121 mg/dL — ABNORMAL HIGH (ref 70–99)
Glucose-Capillary: 122 mg/dL — ABNORMAL HIGH (ref 70–99)
Glucose-Capillary: 122 mg/dL — ABNORMAL HIGH (ref 70–99)
Glucose-Capillary: 124 mg/dL — ABNORMAL HIGH (ref 70–99)
Glucose-Capillary: 128 mg/dL — ABNORMAL HIGH (ref 70–99)
Glucose-Capillary: 129 mg/dL — ABNORMAL HIGH (ref 70–99)
Glucose-Capillary: 130 mg/dL — ABNORMAL HIGH (ref 70–99)
Glucose-Capillary: 130 mg/dL — ABNORMAL HIGH (ref 70–99)
Glucose-Capillary: 130 mg/dL — ABNORMAL HIGH (ref 70–99)
Glucose-Capillary: 134 mg/dL — ABNORMAL HIGH (ref 70–99)
Glucose-Capillary: 135 mg/dL — ABNORMAL HIGH (ref 70–99)
Glucose-Capillary: 140 mg/dL — ABNORMAL HIGH (ref 70–99)
Glucose-Capillary: 141 mg/dL — ABNORMAL HIGH (ref 70–99)
Glucose-Capillary: 141 mg/dL — ABNORMAL HIGH (ref 70–99)
Glucose-Capillary: 141 mg/dL — ABNORMAL HIGH (ref 70–99)
Glucose-Capillary: 142 mg/dL — ABNORMAL HIGH (ref 70–99)
Glucose-Capillary: 146 mg/dL — ABNORMAL HIGH (ref 70–99)
Glucose-Capillary: 150 mg/dL — ABNORMAL HIGH (ref 70–99)
Glucose-Capillary: 165 mg/dL — ABNORMAL HIGH (ref 70–99)
Glucose-Capillary: 167 mg/dL — ABNORMAL HIGH (ref 70–99)
Glucose-Capillary: 168 mg/dL — ABNORMAL HIGH (ref 70–99)
Glucose-Capillary: 168 mg/dL — ABNORMAL HIGH (ref 70–99)
Glucose-Capillary: 172 mg/dL — ABNORMAL HIGH (ref 70–99)
Glucose-Capillary: 177 mg/dL — ABNORMAL HIGH (ref 70–99)
Glucose-Capillary: 180 mg/dL — ABNORMAL HIGH (ref 70–99)
Glucose-Capillary: 181 mg/dL — ABNORMAL HIGH (ref 70–99)
Glucose-Capillary: 187 mg/dL — ABNORMAL HIGH (ref 70–99)
Glucose-Capillary: 187 mg/dL — ABNORMAL HIGH (ref 70–99)
Glucose-Capillary: 192 mg/dL — ABNORMAL HIGH (ref 70–99)
Glucose-Capillary: 203 mg/dL — ABNORMAL HIGH (ref 70–99)
Glucose-Capillary: 208 mg/dL — ABNORMAL HIGH (ref 70–99)
Glucose-Capillary: 208 mg/dL — ABNORMAL HIGH (ref 70–99)
Glucose-Capillary: 210 mg/dL — ABNORMAL HIGH (ref 70–99)
Glucose-Capillary: 211 mg/dL — ABNORMAL HIGH (ref 70–99)
Glucose-Capillary: 215 mg/dL — ABNORMAL HIGH (ref 70–99)
Glucose-Capillary: 227 mg/dL — ABNORMAL HIGH (ref 70–99)
Glucose-Capillary: 227 mg/dL — ABNORMAL HIGH (ref 70–99)
Glucose-Capillary: 228 mg/dL — ABNORMAL HIGH (ref 70–99)
Glucose-Capillary: 252 mg/dL — ABNORMAL HIGH (ref 70–99)
Glucose-Capillary: 270 mg/dL — ABNORMAL HIGH (ref 70–99)
Glucose-Capillary: 80 mg/dL (ref 70–99)
Glucose-Capillary: 91 mg/dL (ref 70–99)
Glucose-Capillary: 94 mg/dL (ref 70–99)
Glucose-Capillary: 106 mg/dL — ABNORMAL HIGH (ref 70–99)
Glucose-Capillary: 108 mg/dL — ABNORMAL HIGH (ref 70–99)
Glucose-Capillary: 112 mg/dL — ABNORMAL HIGH (ref 70–99)
Glucose-Capillary: 131 mg/dL — ABNORMAL HIGH (ref 70–99)
Glucose-Capillary: 135 mg/dL — ABNORMAL HIGH (ref 70–99)
Glucose-Capillary: 158 mg/dL — ABNORMAL HIGH (ref 70–99)
Glucose-Capillary: 234 mg/dL — ABNORMAL HIGH (ref 70–99)
Glucose-Capillary: 78 mg/dL (ref 70–99)
Glucose-Capillary: 92 mg/dL (ref 70–99)

## 2010-11-10 LAB — PHOSPHORUS: Phosphorus: 4.5 mg/dL (ref 2.3–4.6)

## 2010-11-10 LAB — HEPARIN LEVEL (UNFRACTIONATED)
Heparin Unfractionated: 0.13 IU/mL — ABNORMAL LOW (ref 0.30–0.70)
Heparin Unfractionated: 0.22 IU/mL — ABNORMAL LOW (ref 0.30–0.70)
Heparin Unfractionated: 0.23 IU/mL — ABNORMAL LOW (ref 0.30–0.70)
Heparin Unfractionated: 0.34 IU/mL (ref 0.30–0.70)
Heparin Unfractionated: 0.41 IU/mL (ref 0.30–0.70)

## 2010-11-10 LAB — APTT
aPTT: 26 seconds (ref 24–37)
aPTT: 33 seconds (ref 24–37)

## 2010-11-10 LAB — BLOOD GAS, ARTERIAL
Acid-Base Excess: 7.2 mmol/L — ABNORMAL HIGH (ref 0.0–2.0)
Bicarbonate: 31.2 mEq/L — ABNORMAL HIGH (ref 20.0–24.0)
Expiratory PAP: 5
FIO2: 0.4 %
O2 Saturation: 98.9 %
Patient temperature: 98.6
TCO2: 32.5 mmol/L (ref 0–100)
pO2, Arterial: 132 mmHg — ABNORMAL HIGH (ref 80.0–100.0)

## 2010-11-10 LAB — PROTIME-INR
INR: 1.07 (ref 0.00–1.49)
INR: 1.11 (ref 0.00–1.49)
INR: 1.13 (ref 0.00–1.49)
INR: 1.37 (ref 0.00–1.49)
INR: 5.26 (ref 0.00–1.49)
INR: 4.21 — ABNORMAL HIGH (ref 0.00–1.49)
Prothrombin Time: 13.8 seconds (ref 11.6–15.2)
Prothrombin Time: 14.2 seconds (ref 11.6–15.2)
Prothrombin Time: 32.1 seconds — ABNORMAL HIGH (ref 11.6–15.2)
Prothrombin Time: 47.9 seconds — ABNORMAL HIGH (ref 11.6–15.2)
Prothrombin Time: 40.3 seconds — ABNORMAL HIGH (ref 11.6–15.2)
Prothrombin Time: 41.9 seconds — ABNORMAL HIGH (ref 11.6–15.2)
Prothrombin Time: 43.8 seconds — ABNORMAL HIGH (ref 11.6–15.2)

## 2010-11-10 LAB — CULTURE, BAL-QUANTITATIVE W GRAM STAIN: Colony Count: 25000

## 2010-11-10 LAB — DIFFERENTIAL
Basophils Absolute: 0 10*3/uL (ref 0.0–0.1)
Basophils Relative: 0 % (ref 0–1)
Eosinophils Absolute: 0 10*3/uL (ref 0.0–0.7)
Eosinophils Relative: 0 % (ref 0–5)
Lymphocytes Relative: 8 % — ABNORMAL LOW (ref 12–46)
Lymphs Abs: 1.1 10*3/uL (ref 0.7–4.0)
Monocytes Absolute: 0.7 10*3/uL (ref 0.1–1.0)
Monocytes Relative: 5 % (ref 3–12)
Neutro Abs: 11.7 10*3/uL — ABNORMAL HIGH (ref 1.7–7.7)
Neutrophils Relative %: 87 % — ABNORMAL HIGH (ref 43–77)

## 2010-11-10 LAB — STREP PNEUMONIAE URINARY ANTIGEN: Strep Pneumo Urinary Antigen: NEGATIVE

## 2010-11-10 LAB — MAGNESIUM
Magnesium: 2.6 mg/dL — ABNORMAL HIGH (ref 1.5–2.5)
Magnesium: 3 mg/dL — ABNORMAL HIGH (ref 1.5–2.5)
Magnesium: 3.2 mg/dL — ABNORMAL HIGH (ref 1.5–2.5)
Magnesium: 1.9 mg/dL (ref 1.5–2.5)

## 2010-11-10 LAB — ABO/RH: ABO/RH(D): A POS

## 2010-11-10 LAB — POCT CARDIAC MARKERS
CKMB, poc: 6.6 ng/mL (ref 1.0–8.0)
Myoglobin, poc: 73.5 ng/mL (ref 12–200)
Troponin i, poc: <0.05 ng/mL (ref 0.00–0.09)

## 2010-11-10 LAB — URINALYSIS, MICROSCOPIC ONLY
Bilirubin Urine: NEGATIVE
Glucose, UA: NEGATIVE mg/dL
Hgb urine dipstick: NEGATIVE
Ketones, ur: 40 mg/dL — AB
Leukocytes, UA: NEGATIVE
Nitrite: NEGATIVE
Protein, ur: NEGATIVE mg/dL
Specific Gravity, Urine: 1.021 (ref 1.005–1.030)
Urobilinogen, UA: 0.2 mg/dL (ref 0.0–1.0)
pH: 5.5 (ref 5.0–8.0)

## 2010-11-10 LAB — LEGIONELLA ANTIGEN, URINE: Legionella Antigen, Urine: NEGATIVE

## 2010-11-10 LAB — CARDIAC PANEL(CRET KIN+CKTOT+MB+TROPI)
Relative Index: 3.2 — ABNORMAL HIGH (ref 0.0–2.5)
Total CK: 173 U/L (ref 7–232)
Troponin I: 0.08 ng/mL — ABNORMAL HIGH (ref 0.00–0.06)

## 2010-11-10 LAB — CARBOXYHEMOGLOBIN: Methemoglobin: 0.6 % (ref 0.0–1.5)

## 2010-11-10 LAB — LACTIC ACID, PLASMA: Lactic Acid, Venous: 2 mmol/L (ref 0.5–2.2)

## 2010-11-10 LAB — TYPE AND SCREEN
ABO/RH(D): A POS
Antibody Screen: NEGATIVE

## 2010-11-10 LAB — TSH: TSH: 0.953 u[IU]/mL (ref 0.350–4.500)

## 2010-11-10 LAB — BRAIN NATRIURETIC PEPTIDE: Pro B Natriuretic peptide (BNP): 312 pg/mL — ABNORMAL HIGH (ref 0.0–100.0)

## 2010-11-10 LAB — CULTURE, BLOOD (ROUTINE X 2)

## 2010-11-10 LAB — MRSA PCR SCREENING: MRSA by PCR: NEGATIVE

## 2010-11-12 NOTE — Assessment & Plan Note (Signed)
Summary: 4 month rov   Primary Provider/Referring Provider:  [name]  CC:  4 month follow up visit.  History of Present Illness: July 04, 2010- April 05, 2010-COPD/bronchitis/ Chronic hypoxic respiratory failure, allergic rhinitis, AF. Update on mobility status. ..................Mark Kitchenwife here Nurse-CC: 3 month follow up visit-COPD Continues O2 dependent continuous and portatble.Mark [name] Keeps humidity up around 65% in his room. Uses neb 3-4x/day.   Continues prednisone 20 mg daily- can't breathe comfortably with less than that. Denies productive cough as long as he takes mucinex regularly. Had flu shot. Can't remember pneumovax. Had a long discussion about alternative oxygen supply devices.   November 04, 2010--COPD/bronchitis/ Chronic hypoxic respiratory failure, allergic rhinitis, AF....wife here Nurse-CC: 4 month follow up visit Had call in attention for URI in December. Doing fairly well overall- best in a year or two. He credits the mild winter. Remains on continuous O2. Little cough or wheeze usually. Neb helps, used 2-3x/ week. Proair very occasionally. Using a Flutter with some success. Denies chest pain, palpitation, discolored sputum, fever or chills.     Preventive Screening-Counseling & Management  Alcohol-Tobacco     Smoking Status: quit     Packs/Day: 3.0     Year Started: 1957     Year Quit: 1967  Current Medications (verified): 1)  Proair Hfa 108 (90 Base) Mcg/act  Aers (Albuterol Sulfate) .... Inhale 2 Puffs Every 4 Hours As Needed 2)  Epipen 2-Pak 0.3 Mg/0.57ml (1:1000)  Devi (Epinephrine Hcl (Anaphylaxis)) .... As Needed 3)  Centrum Silver  Tabs (Multiple Vitamins-Minerals) .... Take 1 By Mouth Once Daily 4)  Ipratropium-Albuterol 0.5-2.5 (3) Mg/17ml Soln (Ipratropium-Albuterol) .Mark [name].. 1 Neb Four Times A Day As Needed 5)  Portable Nebulizer For Aerosol Meds 6)  Oxygen 1 L/m Sleep/ Rest, 4 L/m Exertion .... [employer] 7)  Symbicort 160-4.5 Mcg/act Aero (Budesonide-Formoterol  Fumarate) .... 2 Puffs Two Times A Day 8)  Power Wheelchair 9)  Tamsulosin Hcl 0.4 Mg Caps (Tamsulosin Hcl) .... Take 1 Tablet By Mouth Once A Day 10)  Tylenol 325 Mg Tabs (Acetaminophen) .... Per Bottle As Needed 11)  Prednisone 20 Mg Tabs (Prednisone) .... Take 1 Tablet By Mouth Once A Day 12)  Ecotrin 325 Mg Tbec (Aspirin) .... Take 1 Tablet By Mouth Once A Day 13)  Ensure  Liqd (Nutritional Supplements) .Mark [name].. 1 Can By Mouth Three Times A Day 14)  Nasal 0.65 % Soln (Saline) .Mark [name].. 1 Spray Each Nostril Two Times A Day 15)  Verapamil Hcl Cr 240 Mg Cr-Tabs (Verapamil Hcl) .... Take 1 Tablet By Mouth Once A Day 16)  Mucinex 600 Mg Xr12h-Tab (Guaifenesin) .... Take 1-2 Tablets Every 12 Hours As Needed 17)  Propafenone Hcl 225 Mg Tabs (Propafenone Hcl) .... Two Times A Day 18)  One-A-Day Calcium Plus 500-100-50 Mg-Unit-Mg Chew (Calcium-Magnesium-Vitamin D) .Mark [name].. 1 Two Times A Day  Allergies (verified): 1)  ! * Shrimp 2)  ! 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 cataract surgery bilaterally  appendectomy inguinal herniorrhaphy.      Family History: Last updated: 10/15/2009  Significant  for his father who had a myocardial   infarction at age 41.  Brother- died COPD Sister- copd Father died-copd, lung cancer Mother- died age 69 old age  Social History: Last updated:  10/15/2009 Patient states former smoker.- heavy, 3 ppd, quit 40 yrs ago Married Product manager plant retired  Risk Factors: Smoking Status: quit (11/04/2010) Packs/Day: 3.0 (11/04/2010)  Review of Systems      See HPI       The patient complains of shortness of breath with activity and joint stiffness or pain.  The patient denies shortness of breath at rest, productive cough, non-productive cough, coughing up blood, chest pain, irregular heartbeats, acid heartburn, indigestion, loss of appetite, weight change, abdominal pain, difficulty swallowing, sore throat, tooth/dental problems, headaches, nasal congestion/difficulty breathing through nose, sneezing, itching, ear ache, depression, rash, change in color of mucus, and fever.    Vital Signs:  Patient profile:   70 year old male Height:      72 inches Weight:      173.38 pounds BMI:     23.60 O2 Sat:      96 % on 2 L/min Pulse rate:   75 / minute BP sitting:   124 / 74  (left arm) Cuff size:   regular  Vitals Entered By: Reynaldo Minium CMA (November 04, 2010 10:57 AM)  O2 Flow:  2 L/min CC: 4 month follow up visit Comments O2 sat on RA rest: 88 O2 sat on RA while ambulating:83 O2 sat on Oxygen while ambulating: 92 2L/M Reynaldo Minium CMA  November 04, 2010 11:01 AM    Physical Exam  Additional Exam:  General: A/Ox3 NAD  , wheelchair, chronically ill-appearing. Sat 96% n 2L at rest- Continuous portable.  SKIN: plethora, steroid echymoses on arms NODES: no lymphadenopathy HEENT: [place]/AT, EOM- WNL, Conjuctivae- clear, PERRLA, TM-WNL, Nose- clear, Throat- clear and wnl NECK: Supple w/ fair ROM, JVD- none,  CHEST: Decreased BS, no wheeze or rhonchi HEART- Reg rate, no m/r/g  ABDOMEN: scaphoid ZOX:WRUE, nl pulses, trace + edema  NEURO: Grossly intact to observation, resting tremor hands      Impression & Recommendations:  Problem # 1:  C O P D (ICD-496) Severe COPD, but stable and as good as he gets now. We  discussed the risk considerations of colonoscopy with concious sedation. He has hx of polyps, anfd if this is the best alternative, compared with barium study or CT colon, then it would better to get it done now, rather than wait till a time when his lungs may not be this good. Med review with no changes to suggest.  He remains on prednisone 20 mg daily maintenance. We looked at this, but he has not been able to stay lower in a long time. We discussed ways to test ability to taper down a little.   Problem # 2:  ATRIAL FIBRILLATION (ICD-427.31) No chest pain or syncope. Coumadin causes easy bruising. Very well contolled now if not sinus rhythm. HR 75, BP 124/74 His updated medication list for this problem includes:    Ecotrin 325 Mg Tbec (Aspirin) .Mark [name]... Take 1 tablet by mouth once a day    Propafenone Hcl 225 Mg Tabs (Propafenone hcl) .Mark [name]..Mark [name] Two times a day  Problem # 3:  ALLERGIC RHINITIS (ICD-477.9)  Not out much with pollen exposure currently.  His updated medication list for this problem includes:    Nasal 0.65 % Soln (Saline) .Mark [name]... 1 spray each nostril two times a day  Other  Orders: Est. Patient Level III (16109)  Patient Instructions: 1)  Please schedule a follow-up appointment in 6 months. 2)  continue present meds.  3)  You can try getting by with less prednisone by alternating  4)  1 tab(20 mg) with 1/2 tab (10 mg) every other or every third day

## 2010-12-12 ENCOUNTER — Telehealth: Payer: Self-pay | Admitting: Internal Medicine

## 2010-12-12 NOTE — Telephone Encounter (Signed)
Spoke with patient- he is aware that he can bring the form by the office and drop off for CDY to look at and decide to sign or not; we will call hi once ready for pick up. Pt understands and will bring the form by the office.

## 2010-12-13 ENCOUNTER — Telehealth: Payer: Self-pay | Admitting: Internal Medicine

## 2010-12-13 NOTE — Telephone Encounter (Signed)
Please advise Florentina Addison if you have seen this come through on your desk. Thanks  Carver Fila, CMA

## 2010-12-16 NOTE — Telephone Encounter (Signed)
Form filled out, at front for pick up-pt is aware.

## 2011-01-07 NOTE — Assessment & Plan Note (Signed)
[facility]                            CARDIOLOGY OFFICE NOTE   NAME:FIELDSEmari, Demmer                       MRN:          [id number]  DATE:02/16/2008                            DOB:          1940-09-13    HISTORY OF PRESENT ILLNESS:  Mr. Komar is a pleasant 70 year old  gentleman who I recently evaluated for chest pain and dyspnea.  He has a  history of significant asthma/COPD and is on home oxygen at night.  When  I saw him, we scheduled him to have a Myoview on Jan 14, 2008.  His  ejection fraction was 63%, and there was no ischemia or infarction.  He  also had an echocardiogram performed on Jan 14, 2008.  His LV function  was normal.  There was mild diastolic dysfunction and mild mitral  regurgitation.  Since then, he continues to have dyspnea on exertion,  which is unchanged.  There is no orthopnea, PND, pedal edema,  palpitations, presyncope, or syncope.  He occasionally feels tightness  in chest, but this increases with inspiration and it is more when he  feels his COPD.   MEDICATIONS:  Prednisone, ProAir, doxycycline, Actonel, Atrovent, and  albuterol.   PHYSICAL EXAMINATION:  VITAL SIGNS:  Shows a blood pressure of 124/82.  His pulse is 96.  He weighs 167 pounds.  HEENT:  Normal.  NECK:  Supple.  CHEST:  Diminished breath sounds throughout.  CARDIOVASCULAR:  Regular rate and rhythm.  ABDOMINAL EXAM:  No tenderness.  EXTREMITIES:  No edema.   DIAGNOSES:  1. Dyspnea - This appeared to be predominantly secondary to his      chronic obstructive pulmonary disease.  His Myoview showed normal      perfusion, and his echocardiogram showed normal left ventricular      function.  We will not proceed with further cardiac workup.  2. Chest tightness - This also appeared predominantly secondary to his      lung disease.  Given that his Myoview is normal, we will not pursue      further ischemia evaluation.  3. Chronic obstructive pulmonary disease  - Managed per [name].  4. Hyperlipidemia - Managed per [name].   We will see him back on an as-needed basis.     Madolyn Frieze Jens Som, MD, [facility]  Electronically Signed    BSC/MedQ  DD: 02/16/2008  DT: 02/17/2008  Job #: [id number]   cc:   Joni Fears D. Maple Hudson, MD, FCCP, FACP  Windle Guard, M.D.

## 2011-01-07 NOTE — Assessment & Plan Note (Signed)
[facility]                            CARDIOLOGY OFFICE NOTE   NAME:FIELDSGermaine, Shenker                       MRN:          [id number]  DATE:01/05/2008                            DOB:          1941/04/25    HISTORY OF PRESENT ILLNESS:  Mr. Manlove is a 70 year old gentleman who I  am asked to evaluate for chest pain and dyspnea.  He has a past medical  history of asthma/COPD and is on home oxygen for 8 years by his report.  He states that for the past 5 years he has noticed that when he exerts  himself to a significant degree.  He feels dyspnea.  He also feels a  mild chest tightness.  When he sits down for 5 minutes his symptoms  improved.  The tightness does not radiate.  It is not pleuritic or  positional, nor is related to food.  It does not occur significantly at  rest.  There is no associated nausea, vomiting or diaphoresis.  Because  of the above, we were asked to further evaluate.   MEDICATIONS:  1. Prednisone 20 mg p.o. daily.  2. ProAir.  3. Doxycycline.  4. Actonel.  5. Atrovent.  6. Albuterol.   ALLERGIES:  SHRIMP.   SOCIAL HISTORY:  He has not smoked in 40 years.  He does not consume  alcohol.   FAMILY HISTORY:  Significant for his father who had a myocardial  infarction at age 13.   PAST MEDICAL HISTORY:  There is no diabetes mellitus or hypertension,  but there is mild hyperlipidemia.  He has bronchitis/asthma/COPD.  He  has had previous bilateral hernia repair and appendectomy, but no other  surgeries are noted.  There is no other significant past medical history  noted.   REVIEW OF SYSTEMS:  He denies any headaches or fevers, chills.  No  productive cough, hemoptysis.  No dysphagia, odynophagia, melena or  hematochezia.  There is no dysuria or hematuria.  There is no rashes or  seizure active.  No orthopnea, PND or pedal edema.  The remaining  systems are negative.   PHYSICAL EXAMINATION:  VITAL SIGNS:  Today shows a blood  pressure  112/76, pulse is 96, he weighs 168 pounds.  GENERAL:  Well-developed and well-nourished, in no acute distress.  SKIN:  Warm and dry, although he does have significant ecchymoses.  He  does not appear to be depressed.  There is no peripheral clubbing.  BACK:  Normal.  HEENT:  Normal with normal eyelids.  His neck is supple with a normal  upstroke bilaterally.  No bruits noted.  There is no jugular distention.  I cannot appreciate thyromegaly.  CHEST:  Shows diminished breath sounds throughout.  CARDIOVASCULAR:  Regular rate and rhythm.  Normal S1-S2.  Heart sounds  are distant, but I cannot appreciate murmurs, rubs or gallops.  ABDOMEN:  Nondistended, nontender.  Positive bowel sounds.  No  hepatosplenomegaly.  No mass appreciated.  There is no abdominal bruit.  He has 2+ femoral pulses bilaterally.  No bruits.  EXTREMITIES:  Show no edema.  I could  palpate no cords.  He has 2+  posterior tibial pulses bilaterally.  NEUROLOGICAL:  Grossly intact.   STUDIES:  Electrocardiogram shows sinus tachycardia rate of 101.  There  are occasional PACs.  There are no significant ST changes.   DIAGNOSES:  1. Chest tightness.  Etiology of this is unclear.  He clearly has      significant lung disease given his history and the fact that he      uses home oxygen.  This certainly could be contributing to his      chest tightness.  However, he has family history of coronary      disease and hyperlipidemia.  We will plan to risk stratify with      stress Myoview.  If it shows normal perfusion, we will most likely      continue therapy for his pulmonary disease.  2. Dyspnea on exertion.  Again, this is most likely related to his      pulmonary disease, but we will check the Myoview and also on      echocardiogram.  3. COPD.  This is being managed per [name], and we will continue      his present medications.  4. Hyperlipidemia.  Managed per [name].   We will see him back in 6 weeks to  review his symptoms and studies.     Madolyn Frieze Jens Som, MD, [facility]  Electronically Signed    BSC/MedQ  DD: 01/05/2008  DT: 01/05/2008  Job #: [id number]   cc:   Joni Fears D. Maple Hudson, MD, FCCP, FACP

## 2011-01-10 NOTE — Assessment & Plan Note (Signed)
[facility]                             PULMONARY OFFICE NOTE   NAME:FIELDSZorian, Mark [name]                       MRN:          [id number]  DATE:12/28/2006                            DOB:          01-09-1941    PROBLEM LIST:  1. Steroid-dependent conjunctival injection/iritis.  2. Asthma/chronic obstructive pulmonary disease.  3. Allergic rhinitis.  4. Rash.  5. Osteopenia.  6. Headache.   HISTORY:  A 57-month followup.  He had quit allergy vaccine about a year  ago now, trying to see what he could do without.  He had quit Xolair in  2005, and had failed Spiriva.  He blames pollen and humidity for a rough  time with his breathing last week, but says he is good today.  He is  needing daily Mucinex and Sudafed most days.  He cannot get below 20 mg  a day of prednisone without having his eyes and face break out.   MEDICATIONS:  1. Home oxygen p.r.n. at 1-1/2 L, used mostly at night, rarely needed      during the day.  2. Prednisone 20 mg.  3. Desonide cream 0.05%.  4. Multivitamin.  5. Calcium.  6. Foradil b.i.d.  7. Albuterol rescue inhaler.  8. He has an EpiPen.   OBJECTIVE:  Weight 175 pounds, BP 122/78, pulse 93, room air saturation  95%.  There is trace wheeze at midback bilaterally.  Face and eyes are not nearly as reddened as I have often seen them.  I do not find adenopathy.  Heart sounds are normal.   IMPRESSION:  1. Asthma/chronic obstructive pulmonary disease.  2. Steroid-dependent dermatitis and conjunctivitis.  I am going to      look through his record and see if there is any possibility that he      might have Churg-Strauss, although I think that issue had been      considered in the past.  Note that he has dropped off of the      measures we tried, including allergy vaccine and Xolair as steroid      sparing.  I have talked with him repeatedly about steroid side      effects, long-term concerns and options.  He feels this is his  best      quality of life.   PLAN:  We refilled Foradil, prescribed prednisone 1 mg tabs so that he  can work with his 5 and 1 mg tablets, trying to taper the dose slowly,  at least down to 18 or 17 mg per day if possible.  Retry Spiriva,  schedule return 6 months, earlier p.r.n.     Clinton D. Maple Hudson, MD, [name], FACP  Electronically Signed    CDY/MedQ  DD: 12/28/2006  DT: 12/29/2006  Job #: [id number]   cc:   Valetta Mole. Swords, MD

## 2011-01-10 NOTE — Assessment & Plan Note (Signed)
[facility]                               PULMONARY OFFICE NOTE   NAME:FIELDSPatryk, Conant                       MRN:          [id number]  DATE:06/29/2006                            DOB:          1940/09/26    PROBLEM:  1. Steroid dependent conjunctival injections/iritis.  2. Asthma/chronic obstructive pulmonary disease.  3. Allergic rhinitis.  4. Rash.  5. Osteopenia.  6. Headache.   HISTORY:  Increasing cough, especially in the mornings when he is bringing  up yellow from his chest and from his nose.  He thinks he has had some low  grade fever and sore throat.  He had been trying to use prednisone 20 mg  tablets 1 daily as a maintenance, but began splitting them to use 30 mg  daily with current illness flared.  Says his conjunctivitis flared also with  weather change.  He is using his albuterol rescue inhaler more frequently.  He continues allergy vaccine at 1:10.  He still has oxygen at 2 L used as  needed.  We reviewed allergy vaccine goals and risk, benefit today,  discussing administration outside of a medical office, anaphylaxis and  epinephrine.  He wishes to continue giving his own and has a current EpiPen  prescription which he has never needed.   MEDICATION:  1. Home oxygen used as needed at 1-1/2 to 2 L.  2. Prednisone 20 mg daily.  3. Desonide cream 0.05%.  4. Lipitor.  5. Multivitamins and calcium.  6. Albuterol rescue inhaler.  7. Sudafed as needed.  8. EpiPen.   Drug intolerant of shrimp.   OBJECTIVE:  Weight 172 pounds.  Blood pressure 114/82.  Pulse regular 109.  Room air saturation 95%.  Dry cough.  HEART:  Sounds regular without murmur.  There is no real wheeze.  No edema or neck vein distention.  His throat is reddened with mild glandular prominence, no visible drainage.  Voice quality is normal.  Conjunctivae are not significantly injected today.  There is moderate nasal congestion.   IMPRESSION:  Exacerbation of  rhinitis and asthma with chronic obstructive  pulmonary disease.  Pattern best fits a viral syndrome but his chronic  steroid dependence complicates things.  We talked again in detail today  about steroid side effects, adrenal insufficiency, bone and eye issues etc.  and discussed alternatives.  The best we are going to be able to do is to  try to work towards lower maintenance prednisone for now.   PLAN:  1. I refilled his prednisone using 10 mg tablets to take 1 or 2 daily,      #200.  2. EpiPen refilled with discussion.  3. Flu vaccine.  4. Z pack at his request.  5. Fluids and supportive care.  6. Schedule return in 6 months, earlier as needed.     Clinton D. Maple Hudson, MD, [facility], FACP  Electronically Signed    CDY/MedQ  DD: 06/29/2006  DT: 06/30/2006  Job #: [id number]   cc:   Valetta Mole. Swords, MD

## 2011-01-10 NOTE — Procedures (Signed)
[place]  Patient:    Mark [name], Mark [name]                       MRN: [id number] Proc. Date: 01/01/01 Adm. Date:  [id number] Attending:  Rich Brave CC:         Hadassah Pais. [name], M.D.   Procedure Report  PROCEDURE:  Colonoscopy with hot biopsy.  INDICATIONS:  Follow-up of colonic adenomas from about 3-1/2 years ago.  FINDINGS:  A small sessile polyp at 30 cm.  DESCRIPTION OF PROCEDURE:  The nature, purpose, and risks of the procedure were familiar to the patient from prior examination, and he provided written consent.  Sedation was fentanyl 50 mcg and Versed 7 mg IV without arrhythmias or desaturation.  The Olympus adult video colonoscope was advanced without much difficulty to the cecum, with the cecum identified by visualization of the ileocecal valve and appendiceal orifice, and pullback was then performed in a gradual fashion.  There was a sessile 4 mm, slightly verrucous polyp at 30 cm, hot biopsied.  I believe there were a few scattered left-sided diverticula.  No large polyps, cancer, colitis, or vascular malformations were observed, and retroflexion in the rectum was unremarkable.  The patient tolerated the procedure well, and there were no apparent complications.  IMPRESSION:  Colonic polyp removed as described above.  PLAN:  Await pathology on the polyp. DD:  01/01/01 TD:  01/01/01 Job: [id number]

## 2011-01-10 NOTE — H&P (Signed)
Mark [name], Mark [name]                          ACCOUNT NO.:  [id number]   MEDICAL RECORD NO.:  [id number]                   PATIENT TYPE:  INP   LOCATION:  3703                                 FACILITY:  [facility]   PHYSICIAN:  Mark [name], M.D.            DATE OF BIRTH:  1941/04/21   DATE OF ADMISSION:  09/12/2003  DATE OF DISCHARGE:                                HISTORY & PHYSICAL   CHIEF COMPLAINT:  Chest pain.   HISTORY OF PRESENT ILLNESS:  Mark [name] is a 70 year old male who has a  longstanding history of COPD and is O2 dependent.  He has had no previous  cardiac complaints.  He presents to the emergency department today from his  primary care's office with complaints of worsening shortness of breath with  exertion over the past few weeks.  Last night while he was at rest, he  developed a coughing episode and then had the onset of chest pain.  It was  basically in the mid portion of his chest.  It was described as a twisting,  knife-life sensation.  There were no associated symptoms at that time.  This  episode lasted for approximately 35-40 minutes.  It eased off with oxygen  therapy.  He was able to go to sleep.  This morning upon awakening, he felt  somewhat weak and fatigued.  He then developed the pain again.  At this time  it radiated halfway down each arm.  He did feel slightly nauseated with no  vomiting.  He also noted that he felt cold.  He still had his cough and  questionably, it was productive.  He had chills over the past few days but  no known fever.  He took his morning medicines, which include prednisone,  theophylline, and Advair with relief, and then subsequently went to his  primary care's office, where he was given nitroglycerin.  He states that  with this, he had relief approximately 5-10 minutes later.  He subsequently  was admitted for further evaluation.   PAST MEDICAL HISTORY:  1. COPD.  2. O2 dependence.  3. Hyperlipidemia.  4. Chronic  shortness of breath.  5. Chronic cough.  6. History of appendectomy.  7. History of hernia repair.   ALLERGIES:  None.   CURRENT MEDICATIONS:  1. Prednisone, usually 10 mg a day; however, lately, he has been up to 20-30     mg for cough.  2. Theo-Dur 300 b.i.d.  3. Advair.  4. Allergy shots.   FAMILY HISTORY:  Father died at 75 with cirrhosis of the liver.  Mother died  at age 61 of natural causes.   SOCIAL HISTORY:  He is married.  He has six children.  He is retired from  [employer].  He had numerous environmental exposures with dust and  fabric.  He has had no smoking use since 1967.  He had previous heavy  alcohol  use but none since 1967.   REVIEW OF SYSTEMS:  He has chronic shortness of breath.  Perhaps it is worse  over the past few weeks, more so with exertion.  He has this chronic cough  which is sometimes productive of yellow sputum and otherwise white in  appearance.  He has had no frank syncope but has had several episodes of  feeling like he is going to pass out and has also been associated with  coughing.  He has had no previous chest pain in the past.  No abdominal  pain, constipation, or diarrhea.  He has gained about 35 pounds of weight  since he has been on chronic prednisone therapy.  Otherwise, review of  systems is as noted above and otherwise unremarkable.   PHYSICAL EXAMINATION:  VITAL SIGNS:  Blood pressure elevated at 175/66,  heart rate 104, respiratory rate 20.  Fever was reported to be 100.2.  O2  saturation is 94%.  GENERAL:  He is an older-appearing white male.  He is currently in no acute  distress.  SKIN:  Warm and dry.  Color is basically unremarkable.  LUNGS:  Diminished breath sounds.  HEART:  Heart tones are distant.  ABDOMEN:  Soft.  Positive bowel sounds.  Nontender.  EXTREMITIES:  Without edema.   PERTINENT LABS:  White count is 14.9, hemoglobin 15, hematocrit 45.  Chemistries are pending.  The first cardiac marker is  negative.   Chest x-ray is currently in progress.   EKG showed sinus tachycardia.  There are no acute changes.   OVERALL IMPRESSION:  1. Cough.  2. Chest pain.  3. Tachycardia.  4. Elevated blood pressure.  5. Chronic obstructive pulmonary disease with oxygen dependence.  6. Hyperlipidemia without previous treatment.   PLAN:  He will be admitted to telemetry.  We will continue to check cardiac  markers and enzymes.  He will be placed on IV nitroglycerin and heparin.  We  will pan-culture and then start empiric Rocephin.  He may need to proceed on  with cardiac catheterization but will defer that decision to Mark [name].  Will add proton pump inhibitor as well as low-dose beta blocker therapy.      Mark [name], N.P.                 Mark [name], M.D.    LCO/MEDQ  D:  09/12/2003  T:  09/12/2003  Job:  [id number]   cc:   Mark [name], M.D.  [address]  [place], [place] [address]  Fax: [phone]   Mark [name]., M.D.  [address] [place]  [place] [address]  Fax: [phone]

## 2011-01-10 NOTE — Consult Note (Signed)
NAME:  Mark [name], Mark [name]                          ACCOUNT NO.:  [id number]   MEDICAL RECORD NO.:  [id number]                   PATIENT TYPE:  INP   LOCATION:  3703                                 FACILITY:  [facility]   PHYSICIAN:  Clinton D. Maple Hudson, M.D.              DATE OF BIRTH:  01/03/1941   DATE OF CONSULTATION:  09/13/2003  DATE OF DISCHARGE:                                   CONSULTATION   REASON FOR CONSULTATION:  Chest pain, cough, and dyspnea.   HISTORY:  This is a 70 year old man with severe COPD, followed in our office  by Dr. Andria Meuse.  He is seen now at the request of Dr. Deborah Chalk in  consultation because of dyspnea, atypical chest pain, and cough.  He quit  smoking many years ago.   CURRENT MEDICATIONS:  1. Regular injections of Xolair.  2. Prednisone 10 mg daily since October 2001.  3. Advair 500/50.  4. Albuterol inhaler.  5. Flonase.  6. Clarinex allergy vaccine.  7. Theophylline which was restarted in December.  8. Home oxygen which he reports using only 15 to 20 minutes a day.   ALLERGIES:  No medication allergies.   He was seen by Dr. Andria Meuse on June 07, 2003, for followup of asthma and  COPD.  Pulmonary function tests at that time showed severe obstructive  airway disease with an FEV1 of 1200 ml (31% of predicted).  He had  previously failed to improved with Spiriva and Singulair.  He describes  increased shortness of breath, cough, and some mild episodic mid sternal  painful discomfort since Christmas.  On the day prior to admission, he had a  coughing episode and some new onset of a sharp, twisting substernal pain.  He did not recognize reflux.  This episode was persistent and eased when he  put himself on oxygen.  The next morning as he woke, he noted that he felt  tired or weak.  The pain came on a gain but is described now as radiating  into both shoulders.  There was mild nausea, but again, no reflux and no  vomiting.  He has continued some cough with  small amounts of white to yellow  sputum but had not really felt that he had caught a could.  He had some  sense of chilliness over the last few days without recognized high fever.  He has not been on recent antibiotics.   PAST MEDICAL HISTORY:  1. COPD.  2. Hyperlipidemia.  3. Appendectomy.  4. Hernia repair.  5. Colonoscopy.   SOCIAL HISTORY:  Married.  He had worked for [employer] with exposure to  dust and fabric lint.  Quit smoking 40 years ago and quit alcohol around the  same time.   FAMILY HISTORY:  Father had cirrhosis.  Mother died of old age.   REVIEW OF SYSTEMS:  Chronic cough and exertional dyspnea, somewhat more  pronounced  in the last three to four weeks.  Intermittent white to yellow  sputum production with no blood.  Some very mild substernal or mid chest  pain noted occasionally over the last several months but not clearly related  to position, activity, or exertion.  No change in GI habits.  Weight has  gone up on chronic prednisone.  No rash or retinopathy.   PHYSICAL EXAMINATION:  VITAL SIGNS:  Blood pressure 115/77, heart rate  regular 95 per minute, respirations 18 per minute, temperature 98.  (He  thinks he has been febrile in the hospital.)  Oxygen saturation 96% on 2  liter prongs.  GENERAL:  Alert and oriented gentleman, well-developed, well-nourished.  He  is able to sit up and sit on the edge of the bed without support or  assistance.  He is wearing nasal oxygen.  SKIN:  No obvious rash.  ADENOPATHY:  None found at the neck, shoulders, or axillae.  HEENT:  Pupils reactive.  Oral mucosa clear.  No stridor or JVD.  CHEST:  There are faint bibasilar and expiratory wheezes.  Taking a deep  breath and exhaling completely triggers nonproductive cough.  No shock  tenderness over the spine and no pain elicited by compressing ribs or  sternum.  I hear no rub, rales, or dullness.  Heart sounds are regular  without murmur or gallops.  ABDOMEN:  No  enlarged liver or spleen.  EXTREMITIES:  No tremor, cyanosis, clubbing, or edema.   LABORATORY DATA:  Radiology: Chest x-ray shows COPD with no active disease  identified.   Chest CT with contrast showed no evidence of pulmonary embolism, aneurysm,  or dissection.  There is atelectasis in the bases and severe emphysema.  No  adenopathy and no obvious acute process otherwise.  There is no evidence of  aortic dissection.  Abdomen was benign.   Chemistry panel unremarkable.  WBC on admission was 14,900 on prednisone  with 80% neutrophils.  Cardiac enzymes were benign.  Urinalysis  unremarkable.   EKG normal   IMPRESSION:  There is little objective now to provide evidence to explain  his complaints.  Sudden onset of this kind of pain with coughing could  reflect any sort of stimulus to nerves supplying the mediastinum.  This  might include esophageal spasm, coronary spasm, an acute reflux event, some  kind of a whiplash-related symmetrical nerve root irritation, as well as the  more tangible problems which were sought very appropriately with a cardiac  workup and evaluation of the great vessels by CT angiography.  His main  complaint now is of weakness or fatigue, and I think he is apprehensive  about the event which is understandable.  His breathing is the same or not  much worse than what he considers baseline.  It is possible that he has had  a low-grade bronchitic exacerbation of his chronic obstructive pulmonary  disease recently, and I would consider that the most likely process to  explain his sustaining cough.   The leukocytosis may be related to his steroid dosing, although an acute  infection or inflammatory event is not yet excluded.  The most likely  explanation currently is exacerbation of chronic obstructive pulmonary  disease with  acute bronchitis and nonspecific musculoskeletal pain.  He needs ongoing observation and monitoring, but if he is otherwise stable, it may  be  appropriate to switch him soon to oral antibiotics and then discharge him  home on his usual regimen to return early for cardiopulmonary followup.  Clinton D. Maple Hudson, M.D.    CDY/MEDQ  D:  09/13/2003  T:  09/14/2003  Job:  [id number]   cc:   Colleen Can. Deborah Chalk, M.D.  Fax: [phone]   Windle Guard, M.D.  [address]  [place], [place] [address]  Fax: [phone]

## 2011-01-10 NOTE — Letter (Signed)
March 19, 2010     RE:  Mark, [name]  MRN:  [id number]  /  DOB:  1941/07/14   To Whom It May Concern:   Reference:  Support for a power mobility device referenced by patient.   This is a letter of support recommending a power mobility device for [name].  Mobility in the home is markedly limited by weakness and  dyspnea.  Pertinent medical problems include chronic obstructive  pulmonary disease with chronic hypoxic respiratory failure, atrial  fibrillation, tremor.  Limiting symptoms include marked exertional  dyspnea and weakness.  His medication list is enclosed.  I have worked  for Mark [name] over a number of years and have seen steady progression.  In the last year, he has become unable to ambulate in our office and has  required an assisted wheelchair with an attendant/family member in order  to get in through the office.  Best estimate is that he can walk 15 or  20 feet without stopping if he holds onto counters and table tops or  uses a cane.  Pace is slow and hesitant.  He has not been strong enough  and has not had enough upper body strength to manage ambulatory  assistance with a cane or walker.  Progression of dyspnea and weakness  now requires use of a power mobility device.  He is not able to maneuver  a manual wheelchair himself and at times does not have assistance in his  home, so he would be dependent on a power device to maneuver within his  home.  Affected  daily activities include toileting, travel from one room to the next,  and ability to obtain own meals, answer the door and telephone, etc.  Physical examination is described with this document.  With some  assistance, he was able to shift from chair to wheelchair but not to  ambulate across waiting room.    Sincerely,      Clinton D. Maple Hudson, MD, Tonny Bollman, FACP  Electronically Signed    CDY/MedQ  DD: 03/19/2010  DT: 03/19/2010  Job #: [id number]

## 2011-01-17 ENCOUNTER — Encounter: Payer: Self-pay | Admitting: Internal Medicine

## 2011-01-17 ENCOUNTER — Telehealth: Payer: Self-pay | Admitting: Internal Medicine

## 2011-01-17 ENCOUNTER — Ambulatory Visit (INDEPENDENT_AMBULATORY_CARE_PROVIDER_SITE_OTHER): Payer: Medicare Other | Admitting: Internal Medicine

## 2011-01-17 VITALS — BP 118/74 | HR 87 | Ht 71.0 in | Wt 171.8 lb

## 2011-01-17 DIAGNOSIS — J309 Allergic rhinitis, unspecified: Secondary | ICD-10-CM

## 2011-01-17 DIAGNOSIS — J441 Chronic obstructive pulmonary disease with (acute) exacerbation: Secondary | ICD-10-CM

## 2011-01-17 MED ORDER — METHYLPREDNISOLONE ACETATE 80 MG/ML IJ SUSP
80.0000 mg | Freq: Once | INTRAMUSCULAR | Status: AC
  Administered 2011-01-17: 80 mg via INTRAMUSCULAR

## 2011-01-17 MED ORDER — DOXYCYCLINE HYCLATE 100 MG PO TABS: ORAL_TABLET | ORAL | Status: DC

## 2011-01-17 MED ORDER — BUDESONIDE-FORMOTEROL FUMARATE 160-4.5 MCG/ACT IN AERO: 2.0000 | INHALATION_SPRAY | Freq: Two times a day (BID) | RESPIRATORY_TRACT | Status: DC

## 2011-01-17 NOTE — Patient Instructions (Addendum)
Depo medrol 80 mg   Refill script Symbicort printed for mail order  Script for antibiotic sent to drug store

## 2011-01-17 NOTE — Telephone Encounter (Signed)
Spoke with pt and he has cough-prod-yellow, nasal congestion, achy all over and very sob, i could hear how sob just talking on the phone. Scheduled pt to see dr young today at 3pm.

## 2011-01-17 NOTE — Assessment & Plan Note (Addendum)
Nonspecific but persistent exacerbation. Most likely viral, but I am concerned about persistence and the long holiday weekend ahead. We will give depo and antibiotic. Discussed options and warning signs that he might need more attention.  meds reviewed and refilled.

## 2011-01-17 NOTE — Progress Notes (Signed)
  Subjective:    Patient ID: Mark [name], male    DOB: 1941/04/17, 70 y.o.   MRN: [id number]  HPI 01/17/11- 23 yo with respiratory failure/ COPD, chronic AFib. Wife here Last here 11/04/10- Note reviewed Got a cold 2 weeks ago. Initally better. In last week-10 days has had more malaise, cough esp in last 3 days. Last antibitoic 2-3 months ago.  Went back up to 20 mg daily prednisone. Was never able to wean off last year when we tried. He has been on steroids many years..   Review of Systems Constitutional:   No weight loss, night sweats,  Fevers, chills, fatigue, lassitude. HEENT:   No headaches,  Difficulty swallowing,  Tooth/dental problems,  Sore throat,                No sneezing, itching, ear ache, nasal congestion, post nasal drip,   CV:  No chest pain,  Orthopnea, PND, swelling in lower extremities, anasarca, dizziness, palpitations  GI  No heartburn, indigestion, abdominal pain, nausea, vomiting, diarrhea, change in bowel habits, loss of appetite  Resp: .  No excess mucus,   No coughing up of blood.  No change in color of mucus.  No wheezing.  Skin: no rash or lesions.  GU: no dysuria, change in color of urine, no urgency or frequency.  No flank pain.  MS:  No joint pain or swelling.  No decreased range of motion.  No back pain.  Psych:  No change in mood or affect. No depression or anxiety.  No memory loss.      Objective:   Physical Exam General- Alert, Oriented, Affect-appropriate, Distress- none acute   Wheelchair, supplemental O2,    Wife  here  Skin- rash-none, lesions- none, excoriation- none  Feels warm  Lymphadenopathy- none  Head- atraumatic  Eyes- Gross vision intact, PERRLA, conjunctivae clear, secretions  Ears- Hearing, canals, - normal  Nose- Clear, No- septal dev, mucus, polyps, erosion, perforation   Throat- Mallampati II , mucosa clear , drainage- none, tonsils- atrophic  Neck- flexible , trachea midline, no stridor , thyroid nl, carotid no  bruit  Chest - symmetrical excursion , unlabored     Heart/CV- RRR , no murmur , no gallop  , no rub, nl s1 s2                     - JVD- none , edema- none, stasis changes- none, varices- none     Lung- diminished, raspy, wheeze- none, cough- none , dullness-none, rub- none     Chest wall-  Abd- tender-no, distended-no, bowel sounds-present, HSM- no  Br/ Gen/ Rectal- Not done, not indicated  Extrem- cyanosis- none, clubbing, none, atrophy- none, strength- nl  Neuro- grossly intact to observation         Assessment & Plan:

## 2011-01-20 ENCOUNTER — Encounter: Payer: Self-pay | Admitting: Internal Medicine

## 2011-01-20 NOTE — Assessment & Plan Note (Signed)
Allergy was a more dominant problem years ago, but hidden in his COPD problems currently.

## 2011-01-23 ENCOUNTER — Other Ambulatory Visit: Payer: Self-pay | Admitting: *Deleted

## 2011-01-23 DIAGNOSIS — I4891 Unspecified atrial fibrillation: Secondary | ICD-10-CM

## 2011-01-23 MED ORDER — PROPAFENONE HCL ER 225 MG PO CP12: 225.0000 mg | ORAL_CAPSULE | Freq: Two times a day (BID) | ORAL | Status: DC

## 2011-01-24 ENCOUNTER — Telehealth: Payer: Self-pay | Admitting: Internal Medicine

## 2011-01-24 DIAGNOSIS — I4891 Unspecified atrial fibrillation: Secondary | ICD-10-CM

## 2011-01-24 MED ORDER — PROPAFENONE HCL 225 MG PO TABS: ORAL_TABLET | ORAL | Status: DC

## 2011-01-24 NOTE — Telephone Encounter (Signed)
The pt's wife states he has been on propafenone tablets and was switched to capsules which are about $40 compared to $8. She wanted to know if there was a reason. I explained it was probably entered in the patient's chart as a capsule. I explained I would fix this in his chart and send to the pharmacy. They will see if they can take his RX back for the capsules since it is sealed.

## 2011-01-24 NOTE — Telephone Encounter (Signed)
Pt wife has question re pt meds. Pt wife would like to talk to a nurse.

## 2011-02-18 ENCOUNTER — Telehealth: Payer: Self-pay | Admitting: Internal Medicine

## 2011-02-18 MED ORDER — ALENDRONATE SODIUM 70 MG PO TABS: 70.0000 mg | ORAL_TABLET | ORAL | Status: AC

## 2011-02-18 NOTE — Telephone Encounter (Signed)
Rx sent to [facility]. Pt notified of recs per CDY and verbalized understanding.

## 2011-02-18 NOTE — Telephone Encounter (Signed)
Spoke with pt. He is asking for refill on alendronate. He states that CDY had to start him on this due to having pt on high doses of prednisone. CDY, pls advise if you are still okay with refilling this med. Thanks!

## 2011-02-18 NOTE — Telephone Encounter (Signed)
Ok to refill. I would prefer that his PCP take over long term management of it  however.

## 2011-03-03 ENCOUNTER — Other Ambulatory Visit: Payer: Self-pay | Admitting: Internal Medicine

## 2011-03-06 NOTE — Telephone Encounter (Signed)
Please advise if okay to send Rx this way-records show 20mg  daily

## 2011-03-06 NOTE — Telephone Encounter (Signed)
Ok to send

## 2011-03-07 NOTE — Telephone Encounter (Signed)
rx sent to the pharmacy. 

## 2011-04-25 ENCOUNTER — Other Ambulatory Visit: Payer: Self-pay | Admitting: Internal Medicine

## 2011-05-07 ENCOUNTER — Encounter: Payer: Self-pay | Admitting: Internal Medicine

## 2011-05-07 ENCOUNTER — Ambulatory Visit (INDEPENDENT_AMBULATORY_CARE_PROVIDER_SITE_OTHER): Payer: Medicare Other | Admitting: Internal Medicine

## 2011-05-07 DIAGNOSIS — Z23 Encounter for immunization: Secondary | ICD-10-CM

## 2011-05-07 DIAGNOSIS — J449 Chronic obstructive pulmonary disease, unspecified: Secondary | ICD-10-CM

## 2011-05-07 MED ORDER — TETANUS-DIPHTH-ACELL PERTUSSIS 5-2.5-18.5 LF-MCG/0.5 IM SUSP: 0.5000 mL | Freq: Once | INTRAMUSCULAR | Status: DC

## 2011-05-07 NOTE — Patient Instructions (Addendum)
For the hip pain I suggest you see your family doctor who can order xrays and refer as appropriate.   Flu vax.   TDAP vax

## 2011-05-07 NOTE — Progress Notes (Signed)
Subjective:    Patient ID: Mark [name], male    DOB: 08/07/1941, 70 y.o.   MRN: [id number]  HPI    Review of Systems     Objective:   Physical Exam        Assessment & Plan:   Subjective:    Patient ID: Mark [name], male    DOB: 11-24-1940, 69 y.o.   MRN: [id number]  HPI 01/17/11- 70 yo with respiratory failure/ COPD, chronic AFib. Wife here Last here 11/04/10- Note reviewed Got a cold 2 weeks ago. Initally better. In last week-10 days has had more malaise, cough esp in last 3 days. Last antibiotic 2-3 months ago.  Went back up to 20 mg daily prednisone. Was never able to wean off last year when we tried. He has been on steroids many years..   05/07/11-  74 yo M former 3 [employer] smoker with respiratory failure/ COPD, chronic AFib. Wife here No major changes. Uses nebulizer 3-5x/ day, infrequent use of rescue inhaler.Stays on prednisone 20 mg daily. He couldn't function at 15 mg daily.No recent infection. Fought off a mild cold a few weeks ago. He used his standby doxy for that and feels it helped. Daily mucinex. Daily cough with some phlegm. For flu vax today. Oxygen continuous- 1.5 - 2 L/M.  In past few weeks has noted pain down from right hip to ankle- discussed as likely pinched nerve/ compression fx from steroids. Spends much of each day sitting.   Review of Systems Constitutional:   No-   weight loss, night sweats, fevers, chills, fatigue, lassitude. HEENT:   No-  headaches, difficulty swallowing, tooth/dental problems, sore throat,       No-  sneezing, itching, ear ache, nasal congestion, post nasal drip,  CV:  No-   chest pain, orthopnea, PND, swelling in lower extremities, anasarca, dizziness, palpitations Resp: No-   shortness of breath with exertion or at rest.              Little cough,  No-  coughing up of blood.              No-   change in color of mucus.  Slight wheezing.   Skin: No-   rash or lesions. GI:  No-   heartburn, indigestion, abdominal pain, nausea,  vomiting, diarrhea,                 change in bowel habits, loss of appetite GU: No-   dysuria, change in color of urine, no urgency or frequency.  No- flank pain. MS:  No-   joint pain or swelling.  No- decreased range of motion.  + back pain- HPI Neuro- grossly normal to observation, Or:  Psych:  No- change in mood or affect. No depression or anxiety.  No memory loss.       Objective:   Physical Exam General- Alert, Oriented, Affect-appropriate, Distress- none acute, wheelchair, O2 1.5l/m demand regulator Skin- rash-none, lesions- none, excoriation- none. Steroid fragility and echymoses Lymphadenopathy- none Head- atraumatic            Eyes- Gross vision intact, PERRLA, conjunctivae clear secretions            Ears- Hearing, canals normal            Nose- Clear, No-Septal dev, mucus, polyps, erosion, perforation             Throat- Mallampati II , mucosa clear , drainage- none, tonsils- atrophic Neck- flexible , trachea  midline, no stridor , thyroid nl, carotid no bruit Chest - symmetrical excursion , unlabored           Heart/CV- RRR , no murmur , no gallop  , no rub, nl s1 s2                           - JVD- none , edema- none, stasis changes- none, varices- none           Lung- dry crackles, distant.  , wheeze- none, cough- none , dullness-none, rub- none           Chest wall-  Abd- tender-no, distended-no, bowel sounds-present, HSM- no Br/ Gen/ Rectal- Not done, not indicated Extrem- cyanosis- none, clubbing, none, atrophy- none, strength- nl Neuro- grossly intact to observation         Assessment & Plan:

## 2011-05-07 NOTE — Assessment & Plan Note (Signed)
Severe COPD with chronic hypoxic respiratory failure, o2 and steroid dependent. We assume he has adrenal insufficiency. He is not having so much bronchitis that Daliresp would make sense now.  Flu vax discussed.

## 2011-05-08 ENCOUNTER — Telehealth: Payer: Self-pay | Admitting: Internal Medicine

## 2011-05-08 MED ORDER — DOXYCYCLINE HYCLATE 100 MG PO TABS
ORAL_TABLET | ORAL | Status: DC
Start: 2011-05-08 — End: 2011-11-05

## 2011-05-08 MED ORDER — EPINEPHRINE 0.3 MG/0.3ML IJ DEVI: INTRAMUSCULAR | Status: DC

## 2011-05-08 NOTE — Telephone Encounter (Signed)
Called and spoke with pt. Pt was seen by CY yesterday.  Pt states he was under the impression he was supposed to get an rx for Doxycycline to have on hand and also rx for Epipen.  Neither of these were sent to pharmacy.  CY, please advise if ok or not.  Thanks.

## 2011-05-08 NOTE — Telephone Encounter (Signed)
Ok to send doxycycline 100 mg, # 8, 2 today then one daily, ref x 3  Ok to send Epipen (not junior)    Ref prn

## 2011-05-08 NOTE — Telephone Encounter (Signed)
Rxs were sent to pharm Spoke with pt and notified that this was done 

## 2011-06-19 ENCOUNTER — Other Ambulatory Visit: Payer: Self-pay | Admitting: Internal Medicine

## 2011-06-20 MED ORDER — PREDNISONE 20 MG PO TABS: 20.0000 mg | ORAL_TABLET | Freq: Every day | ORAL | Status: DC

## 2011-06-20 NOTE — Telephone Encounter (Signed)
Prednisone 20 mg, # 30, 1 daily, refill x 3     Try holding at this dose and see how he does.

## 2011-06-20 NOTE — Telephone Encounter (Signed)
RX sent and the patient is aware.

## 2011-06-20 NOTE — Telephone Encounter (Signed)
Last OV 05/05/11, Pls advise on sending refills and clarify directions for use.

## 2011-08-08 ENCOUNTER — Other Ambulatory Visit: Payer: Self-pay | Admitting: Allergy

## 2011-08-08 MED ORDER — IPRATROPIUM-ALBUTEROL 0.5-2.5 (3) MG/3ML IN SOLN: 3.0000 mL | Freq: Four times a day (QID) | RESPIRATORY_TRACT | Status: DC | PRN

## 2011-08-08 MED ORDER — ALBUTEROL SULFATE (2.5 MG/3ML) 0.083% IN NEBU: 2.5000 mg | INHALATION_SOLUTION | Freq: Four times a day (QID) | RESPIRATORY_TRACT | Status: DC | PRN

## 2011-08-08 NOTE — Telephone Encounter (Signed)
Per CY_okay to fax(paper rx) to [phone] [facility]

## 2011-09-02 ENCOUNTER — Encounter: Payer: Self-pay | Admitting: Internal Medicine

## 2011-09-02 ENCOUNTER — Ambulatory Visit (INDEPENDENT_AMBULATORY_CARE_PROVIDER_SITE_OTHER): Payer: Medicare Other | Admitting: Internal Medicine

## 2011-09-02 VITALS — BP 110/73 | Ht 71.0 in | Wt 169.0 lb

## 2011-09-02 DIAGNOSIS — J449 Chronic obstructive pulmonary disease, unspecified: Secondary | ICD-10-CM

## 2011-09-02 DIAGNOSIS — J4489 Other specified chronic obstructive pulmonary disease: Secondary | ICD-10-CM

## 2011-09-02 DIAGNOSIS — I4891 Unspecified atrial fibrillation: Secondary | ICD-10-CM

## 2011-09-02 NOTE — Assessment & Plan Note (Signed)
Given the above, he may well at risk for multifocal atrial tachycardia. Interestingly his right ventricular assessments 2 years ago were pretty good both in terms of structure and pressures

## 2011-09-02 NOTE — Patient Instructions (Addendum)
Your physician has recommended you make the following change in your medication:  1) Decrease aspirin to 81 mg one tablet daily. 2) Start Magnesium oxide 400 mg one tablet by mouth daily.  Your physician recommends that you have lab work today: bmp/cbc/tsh/free t3/ free t4  Your physician wants you to follow-up in: 1 year with Dr. [name]. You will receive a reminder letter in the mail two months in advance. If you don't receive a letter, please call our office to schedule the follow-up appointment.

## 2011-09-02 NOTE — Progress Notes (Signed)
HPI  Mark [name] is a 71 y.o. male seen in followup for paroxysmal atrial fibrillation occurring in the context of severe oxygen-dependent COPD and prior ablation for atrial flutter.  Echo 2011 Nl LV fn;nl atrial size   He has had no significant recurrent palpitations. He does note that on some occasions his heart rate as detected by his pulse ox ranges high as 130. This typically lasts only minutes and then returns to the 70-105 range where he typically resides.   He has some superficial bleeding with his aspirin. He asks whether to be discontinued. Review of his laboratories a year ago demonstrated recurrent problems with hypokalemia. It has not been checked in the interim.  Past Medical History  Diagnosis Date  . Atrial fibrillation   . Hyperlipidemia   . SVT (supraventricular tachycardia)   . Weakness   . Dyspnea   . Obstructive chronic bronchitis with exacerbation   . Headache   . Osteopenia   . COPD (chronic obstructive pulmonary disease)   . Asthma   . Allergic rhinitis     Past Surgical History  Procedure Date  . Catheter ablation   . Cataract extraction, bilateral   . Appendectomy   . Inguinal hernia repair     Current Outpatient Prescriptions  Medication Sig Dispense Refill  . acetaminophen (TYLENOL) 325 MG tablet Take 650 mg by mouth as needed. Per bottle       . albuterol (PROAIR HFA) 108 (90 BASE) MCG/ACT inhaler Inhale 2 puffs into the lungs every 4 (four) hours as needed.        Marland Kitchen albuterol (PROVENTIL) (2.5 MG/3ML) 0.083% nebulizer solution Take 3 mLs (2.5 mg total) by nebulization every 6 (six) hours as needed for wheezing.  75 mL  12  . alendronate (FOSAMAX) 70 MG tablet Take 1 tablet (70 mg total) by mouth every 7 (seven) days. Take with a full glass of water on an empty stomach.  12 tablet  1  . aspirin 325 MG tablet Take 325 mg by mouth daily.        . budesonide-formoterol (SYMBICORT) 160-4.5 MCG/ACT inhaler Inhale 2 puffs into the lungs 2 (two)  times daily. Rinse mouth  3 Inhaler  3  . Calcium-Magnesium-Vitamin D (ONE-A-DAY CALCIUM PLUS) 500-50-100 MG-MG-UNIT CHEW Chew 1 tablet by mouth 2 (two) times daily.        Marland Kitchen EPINEPHrine (EPIPEN 2-PAK) 0.3 mg/0.3 mL DEVI Use as directed  1 Device  11  . guaiFENesin (MUCINEX) 600 MG 12 hr tablet Take 1-2 mg by mouth every 12 (twelve) hours as needed.        Marland Kitchen ipratropium-albuterol (DUONEB) 0.5-2.5 (3) MG/3ML SOLN Take 3 mLs by nebulization 4 (four) times daily as needed.  360 mL  11  . Multiple Vitamins-Minerals (CENTRUM SILVER PO) Take 1 tablet by mouth daily.        . propafenone (RYTHMOL) 225 MG tablet Take one tablet by mouth twice daily  60 tablet  11  . verapamil (CALAN-SR) 240 MG CR tablet TAKE 1 TABLET BY MOUTH ONCE A DAY  30 tablet  6  . doxycycline (VIBRA-TABS) 100 MG tablet Take 2 today, then one daily until gone  8 tablet  3  . sodium chloride (OCEAN) 0.65 % nasal spray 1 spray by Nasal route 2 (two) times daily.         Current Facility-Administered Medications  Medication Dose Route Frequency Provider Last Rate Last Dose  . TDaP (BOOSTRIX) injection 0.5 mL  0.5  mL Intramuscular Once Waymon Budge, MD        Allergies  Allergen Reactions  . Ambien   . Shrimp (Shellfish Allergy)     Review of Systems negative except from HPI and PMH  Physical Exam BP 110/73  Ht 5\' 11"  (1.803 m)  Wt 169 lb (76.658 kg)  BMI 23.57 kg/m2 Well developed and somewhat cachectic gentleman wearing oxygen sitting in a wheelchair HENT normal E scleral and icterus clear Neck Supple JVP difficult to discern  Clear decreased breath sounds with some wheezing  irregularly irregular Soft with active bowel sounds No clubbing cyanosis Trace Edema Alert and oriented, grossly normal motor and sensory function Skin Warm and Dry  Sinus versus multifocal atrial tachycardia  Assessment and  Plan

## 2011-09-02 NOTE — Assessment & Plan Note (Signed)
The patient has had no recurrent atrial fibrillation of which we are aware. He will continue on his Rythmol. He would like to stop his aspirin. His CHADS-VASc score is 1; this is a reasonable request  He also has evidence today on electrocardiogram of multifocal atrial tachycardia. We'll add magnesium and check thyroid studies as a year ago his TSH was borderline low. We'll also check a CBC. At that time his potassium was also running in the low threes. We'll repeat that also.

## 2011-09-03 LAB — BASIC METABOLIC PANEL
BUN: 17 mg/dL (ref 6–23)
Calcium: 9.8 mg/dL (ref 8.4–10.5)
Creatinine, Ser: 1 mg/dL (ref 0.4–1.5)
GFR: 78.29 mL/min (ref >60.00)
Glucose, Bld: 151 mg/dL — ABNORMAL HIGH (ref 70–99)

## 2011-09-03 LAB — CBC WITH DIFFERENTIAL/PLATELET
Basophils Absolute: 0.1 10*3/uL (ref 0.0–0.1)
HCT: 41.8 % (ref 39.0–52.0)
Lymphs Abs: 2 10*3/uL (ref 0.7–4.0)
Monocytes Relative: 6.2 % (ref 3.0–12.0)
Platelets: 288 10*3/uL (ref 150.0–400.0)
RDW: 15 % — ABNORMAL HIGH (ref 11.5–14.6)

## 2011-09-03 LAB — TSH: TSH: 0.88 u[IU]/mL (ref 0.35–5.50)

## 2011-09-04 NOTE — Progress Notes (Signed)
Addended by: Judithe Modest D on: 09/04/2011 09:21 AM   Modules accepted: Orders

## 2011-11-05 ENCOUNTER — Ambulatory Visit (INDEPENDENT_AMBULATORY_CARE_PROVIDER_SITE_OTHER): Payer: Medicare Other | Admitting: Internal Medicine

## 2011-11-05 ENCOUNTER — Encounter: Payer: Self-pay | Admitting: Internal Medicine

## 2011-11-05 VITALS — BP 118/58 | HR 100 | Ht 71.0 in | Wt 175.2 lb

## 2011-11-05 DIAGNOSIS — J4489 Other specified chronic obstructive pulmonary disease: Secondary | ICD-10-CM

## 2011-11-05 DIAGNOSIS — I4891 Unspecified atrial fibrillation: Secondary | ICD-10-CM

## 2011-11-05 DIAGNOSIS — J449 Chronic obstructive pulmonary disease, unspecified: Secondary | ICD-10-CM

## 2011-11-05 MED ORDER — DOXYCYCLINE HYCLATE 100 MG PO TABS: ORAL_TABLET | ORAL | Status: DC

## 2011-11-05 NOTE — Assessment & Plan Note (Signed)
Oxygen dependent, but looking stronger than I have seen him in a while. Discussed meds and O2. No changes are appropriate now.  Ok to refill doxycycline to hold.

## 2011-11-05 NOTE — Assessment & Plan Note (Signed)
By my exam- sinus rhythm today

## 2011-11-05 NOTE — Progress Notes (Signed)
Patient ID: Mark [name], male    DOB: 1941/06/24, 71 y.o.   MRN: [id number]  HPI 01/17/11- 68 yo with respiratory failure/ COPD, chronic AFib. Wife here Last here 11/04/10- Note reviewed Got a cold 2 weeks ago. Initally better. In last week-10 days has had more malaise, cough esp in last 3 days. Last antibiotic 2-3 months ago.  Went back up to 20 mg daily prednisone. Was never able to wean off last year when we tried. He has been on steroids many years..   05/07/11-  58 yo M former 3 PPD smoker with respiratory failure/ COPD, chronic AFib. Wife here No major changes. Uses nebulizer 3-5x/ day, infrequent use of rescue inhaler.Stays on prednisone 20 mg daily. He couldn't function at 15 mg daily.No recent infection. Fought off a mild cold a few weeks ago. He used his standby doxy for that and feels it helped. Daily mucinex. Daily cough with some phlegm. For flu vax today. Oxygen continuous- 1.5 - 2 L/M.  In past few weeks has noted pain down from right hip to ankle- discussed as likely pinched nerve/ compression fx from steroids. Spends much of each day sitting.   11/05/11-70 yo M former 3 PPD smoker with respiratory failure/ COPD/ steroid dependent, chronic AFib. Wife here. PCP Dr [name] Had chest cold at [facility] but finally better in past month. Remains on prednisone 20 mg daily. He has been able to be much more active, feeling better. Up and around more in house on O2 2 L/M with less need for wheelchair and with no acute issues. Denies infection, blood, chest pain. Had injections for back pain.   Review of Systems- see HPI Constitutional:   No-   weight loss, night sweats, fevers, chills, fatigue, lassitude. HEENT:   No-  headaches, difficulty swallowing, tooth/dental problems, sore throat,       No-  sneezing, itching, ear ache, nasal congestion, post nasal drip,  CV:  No-   chest pain, orthopnea, PND, swelling in lower extremities, anasarca, dizziness, palpitations Resp: + shortness of breath  with exertion or at rest.              Little cough,  No-  coughing up of blood.              No-   change in color of mucus.  Slight wheezing.   Skin: No-   rash or lesions. GI:  No-   heartburn, indigestion, abdominal pain, nausea, vomiting, GU: No-   dysuria,  MS:  No-   joint pain or swelling.  + back pain- HPI Neuro- grossly normal to observation, Or:  Psych:  No- change in mood or affect. No depression or anxiety.  No memory loss.  Objective:   Physical Exam General- Alert, Oriented, Affect-appropriate, Distress- none acute, wheelchair, O2   2 l/m demand regulator. Here w/out wheelchair today. Skin- rash-none, lesions- none, excoriation- none. Steroid fragility and echymoses Lymphadenopathy- none Head- atraumatic            Eyes- Gross vision intact, PERRLA, conjunctivae clear secretions            Ears- Hearing, canals normal            Nose- Clear, No-Septal dev, mucus, polyps, erosion, perforation             Throat- Mallampati II , mucosa clear , drainage- none, tonsils- atrophic Neck- flexible , trachea midline, no stridor , thyroid nl, carotid no bruit Chest - symmetrical  excursion , unlabored           Heart/CV- RRR to my exam (no pacemaker) , no murmur , no gallop  , no rub, nl s1 s2                            JVD- none , edema- none, stasis changes- none, varices- none           Lung- Very distant, w/ diffuse slow expiratory wheeze, cough- none , dullness-none, rub- none           Chest wall-  Abd- Br/ Gen/ Rectal- Not done, not indicated Extrem- cyanosis- none, clubbing, none, atrophy- none, strength- nl Neuro- grossly intact to observation

## 2011-11-05 NOTE — Patient Instructions (Addendum)
Continue present meds and oxygen  Script to hold for doxycycline antibiotic- sent to drug store

## 2011-11-19 ENCOUNTER — Other Ambulatory Visit: Payer: Self-pay | Admitting: Internal Medicine

## 2011-11-20 ENCOUNTER — Other Ambulatory Visit: Payer: Self-pay

## 2011-11-20 MED ORDER — VERAPAMIL HCL ER 240 MG PO TBCR: 240.0000 mg | EXTENDED_RELEASE_TABLET | Freq: Every day | ORAL | Status: DC

## 2011-12-10 ENCOUNTER — Other Ambulatory Visit: Payer: Self-pay | Admitting: Internal Medicine

## 2012-01-21 ENCOUNTER — Other Ambulatory Visit: Payer: Self-pay | Admitting: Internal Medicine

## 2012-01-21 DIAGNOSIS — I4891 Unspecified atrial fibrillation: Secondary | ICD-10-CM

## 2012-01-21 MED ORDER — PROPAFENONE HCL 225 MG PO TABS: ORAL_TABLET | ORAL | Status: DC

## 2012-02-27 ENCOUNTER — Other Ambulatory Visit: Payer: Self-pay | Admitting: Internal Medicine

## 2012-03-01 ENCOUNTER — Telehealth: Payer: Self-pay | Admitting: Internal Medicine

## 2012-03-01 MED ORDER — BUDESONIDE-FORMOTEROL FUMARATE 160-4.5 MCG/ACT IN AERO: 2.0000 | INHALATION_SPRAY | Freq: Two times a day (BID) | RESPIRATORY_TRACT | Status: DC

## 2012-03-01 NOTE — Telephone Encounter (Signed)
Rx sent and patient is aware. 

## 2012-03-09 ENCOUNTER — Other Ambulatory Visit: Payer: Self-pay | Admitting: Internal Medicine

## 2012-04-05 ENCOUNTER — Other Ambulatory Visit: Payer: Self-pay | Admitting: Internal Medicine

## 2012-04-14 DIAGNOSIS — H01009 Unspecified blepharitis unspecified eye, unspecified eyelid: Secondary | ICD-10-CM | POA: Insufficient documentation

## 2012-04-14 DIAGNOSIS — H02109 Unspecified ectropion of unspecified eye, unspecified eyelid: Secondary | ICD-10-CM | POA: Insufficient documentation

## 2012-05-07 ENCOUNTER — Encounter: Payer: Self-pay | Admitting: Internal Medicine

## 2012-05-07 ENCOUNTER — Ambulatory Visit (INDEPENDENT_AMBULATORY_CARE_PROVIDER_SITE_OTHER): Payer: Medicare Other | Admitting: Internal Medicine

## 2012-05-07 VITALS — BP 126/62 | HR 91 | Ht 71.0 in | Wt 171.0 lb

## 2012-05-07 DIAGNOSIS — J449 Chronic obstructive pulmonary disease, unspecified: Secondary | ICD-10-CM

## 2012-05-07 DIAGNOSIS — Z23 Encounter for immunization: Secondary | ICD-10-CM

## 2012-05-07 DIAGNOSIS — J4489 Other specified chronic obstructive pulmonary disease: Secondary | ICD-10-CM

## 2012-05-07 MED ORDER — DOXYCYCLINE HYCLATE 100 MG PO TABS: ORAL_TABLET | ORAL | Status: DC

## 2012-05-07 NOTE — Patient Instructions (Addendum)
Script sent for doxycycline with refills  Flu vax  Talk over your travel needs with [employer], so they have the paperwork ready for renting the O2 concentrator and have documentation for your needs while out of town.

## 2012-05-07 NOTE — Progress Notes (Signed)
Patient ID: Mark [name], male    DOB: 1941-07-13, 71 y.o.   MRN: [id number]  HPI 01/17/11- 71 yo with respiratory failure/ COPD, chronic AFib. Wife here Last here 11/04/10- Note reviewed Got a cold 2 weeks ago. Initally better. In last week-10 days has had more malaise, cough esp in last 3 days. Last antibiotic 2-3 months ago.  Went back up to 20 mg daily prednisone. Was never able to wean off last year when we tried. He has been on steroids many years..   05/07/11-  71 yo M former 3 PPD smoker with respiratory failure/ COPD, chronic AFib. Wife here No major changes. Uses nebulizer 3-5x/ day, infrequent use of rescue inhaler.Stays on prednisone 20 mg daily. He couldn't function at 15 mg daily.No recent infection. Fought off a mild cold a few weeks ago. He used his standby doxy for that and feels it helped. Daily mucinex. Daily cough with some phlegm. For flu vax today. Oxygen continuous- 1.5 - 2 L/M.  In past few weeks has noted pain down from right hip to ankle- discussed as likely pinched nerve/ compression fx from steroids. Spends much of each day sitting.   11/05/11-71 yo M former 3 PPD smoker with respiratory failure/ COPD/ steroid dependent, chronic AFib. Wife here. PCP Dr [name] Had chest cold at [facility] but finally better in past month. Remains on prednisone 20 mg daily. He has been able to be much more active, feeling better. Up and around more in house on O2 2 L/M with less need for wheelchair and with no acute issues. Denies infection, blood, chest pain. Had injections for back pain.   05/07/12- 71 yo M former 3 PPD smoker with respiratory failure/ COPD/ steroid dependent, chronic AFib.   Wife here.  PCP Dr [name] Denies any SOB, wheezing, cough, or congestion for about the past 2 weeks Remains on oxygen 2 L/[employer].  They plan to fly to [address] for a family wedding. I filled out the airplane form. They will be taking a portable concentrator. We discussed oxygen, altitude and  exertion. He remains steroid dependent 20 mg prednisone daily.  Review of Systems- see HPI Constitutional:   No-   weight loss, night sweats, fevers, chills, fatigue, lassitude. HEENT:   No-  headaches, difficulty swallowing, tooth/dental problems, sore throat,       No-  sneezing, itching, ear ache, nasal congestion, post nasal drip,  CV:  No-   chest pain, orthopnea, PND, swelling in lower extremities, anasarca, dizziness, palpitations Resp: + shortness of breath with exertion or at rest.              Little cough,  No-  coughing up of blood.              No-   change in color of mucus.  Slight wheezing.   Skin: No-   rash or lesions. GI:  No-   heartburn, indigestion, abdominal pain, nausea, vomiting, GU: No-   dysuria,  MS:  No-   joint pain or swelling.  + back pain- HPI Neuro- nothing unusual  Psych:  No- change in mood or affect. No depression or anxiety.  No memory loss.  Objective:   Physical Exam General- Alert, Oriented, Affect-appropriate, Distress- none acute, wheelchair, O2   2 l/m demand regulator. Skin- rash-none, lesions- none, excoriation- none. Steroid fragility and echymoses Lymphadenopathy- none Head- atraumatic            Eyes- Gross vision intact, PERRLA,  conjunctivae clear secretions            Ears- Hearing, canals normal            Nose- Clear, No-Septal dev, mucus, polyps, erosion, perforation             Throat- Mallampati II , mucosa clear , drainage- none, tonsils- atrophic Neck- flexible , trachea midline, no stridor , thyroid nl, carotid no bruit Chest - symmetrical excursion , unlabored           Heart/CV- RRR to my exam (no pacemaker) , no murmur , no gallop  , no rub, nl s1 s2                            JVD- none , edema- none, stasis changes- none, varices- none           Lung- +Very distant, w/ diffuse slow expiratory wheeze, cough- none , dullness-none, rub- none           Chest wall-  Abd- Br/ Gen/ Rectal- Not done, not indicated Extrem-  cyanosis- none, clubbing, none, atrophy- none, strength- nl Neuro- grossly intact to observation

## 2012-05-16 NOTE — Assessment & Plan Note (Signed)
Plan-caution and education related to air travel, altitude and oxygen. They will take a portable concentrator but will also contact the local branch of the  DME company, which does have an office near [facility], for backup. Refilled doxycycline to carry.

## 2012-06-02 ENCOUNTER — Telehealth: Payer: Self-pay | Admitting: Internal Medicine

## 2012-06-02 NOTE — Telephone Encounter (Signed)
Pt states that he is needing to get Rx changed to reflect that his O2 levels drop at to under 90% during exertion.  Pt is at [employer] right now.  Pt's flight is tomorrow morning.  Pt asked to be reached at [phone].  Please call ASAP-wants this taken care of before flying as discussed w/ CY.  Antionette Fairy

## 2012-06-02 NOTE — Telephone Encounter (Signed)
Jimmy from [employer] faxed the needed information and CY has corrected the information. Jimmy aware that I have refaxed order to him and patient can get O2 to take with him on his trip.

## 2012-08-06 ENCOUNTER — Other Ambulatory Visit: Payer: Self-pay | Admitting: Internal Medicine

## 2012-08-09 MED ORDER — PREDNISONE 20 MG PO TABS: 20.0000 mg | ORAL_TABLET | Freq: Every day | ORAL | Status: DC

## 2012-08-09 NOTE — Telephone Encounter (Signed)
Rx refilled.

## 2012-08-10 ENCOUNTER — Encounter: Payer: Self-pay | Admitting: Internal Medicine

## 2012-08-10 ENCOUNTER — Ambulatory Visit (INDEPENDENT_AMBULATORY_CARE_PROVIDER_SITE_OTHER): Payer: Medicare Other | Admitting: Internal Medicine

## 2012-08-10 ENCOUNTER — Telehealth: Payer: Self-pay | Admitting: Internal Medicine

## 2012-08-10 ENCOUNTER — Encounter (HOSPITAL_COMMUNITY): Payer: Self-pay | Admitting: Physical Medicine and Rehabilitation

## 2012-08-10 ENCOUNTER — Inpatient Hospital Stay (HOSPITAL_COMMUNITY): Payer: Medicare Other

## 2012-08-10 ENCOUNTER — Ambulatory Visit (INDEPENDENT_AMBULATORY_CARE_PROVIDER_SITE_OTHER)
Admission: RE | Admit: 2012-08-10 | Discharge: 2012-08-10 | Disposition: A | Discharge status: Home / Self Care | Payer: Medicare Other | Source: Ambulatory Visit | Attending: Internal Medicine | Admitting: Internal Medicine

## 2012-08-10 ENCOUNTER — Inpatient Hospital Stay (HOSPITAL_COMMUNITY)
Admission: EM | Admit: 2012-08-10 | Discharge: 2012-08-31 | DRG: 164 | Disposition: A | Discharge status: Home Health | Payer: Medicare Other | Attending: Pulmonary Disease | Admitting: Pulmonary Disease

## 2012-08-10 VITALS — BP 130/68 | HR 113 | Ht 71.0 in | Wt 165.0 lb

## 2012-08-10 DIAGNOSIS — I4891 Unspecified atrial fibrillation: Secondary | ICD-10-CM | POA: Diagnosis present

## 2012-08-10 DIAGNOSIS — J9382 Other air leak: Secondary | ICD-10-CM | POA: Diagnosis not present

## 2012-08-10 DIAGNOSIS — I480 Paroxysmal atrial fibrillation: Secondary | ICD-10-CM | POA: Diagnosis present

## 2012-08-10 DIAGNOSIS — Z87891 Personal history of nicotine dependence: Secondary | ICD-10-CM

## 2012-08-10 DIAGNOSIS — R0602 Shortness of breath: Secondary | ICD-10-CM

## 2012-08-10 DIAGNOSIS — J9383 Other pneumothorax: Principal | ICD-10-CM | POA: Diagnosis present

## 2012-08-10 DIAGNOSIS — R7309 Other abnormal glucose: Secondary | ICD-10-CM | POA: Diagnosis present

## 2012-08-10 DIAGNOSIS — Z9981 Dependence on supplemental oxygen: Secondary | ICD-10-CM

## 2012-08-10 DIAGNOSIS — Z9889 Other specified postprocedural states: Secondary | ICD-10-CM

## 2012-08-10 DIAGNOSIS — J449 Chronic obstructive pulmonary disease, unspecified: Secondary | ICD-10-CM | POA: Diagnosis present

## 2012-08-10 DIAGNOSIS — D72829 Elevated white blood cell count, unspecified: Secondary | ICD-10-CM | POA: Diagnosis present

## 2012-08-10 DIAGNOSIS — J441 Chronic obstructive pulmonary disease with (acute) exacerbation: Secondary | ICD-10-CM

## 2012-08-10 DIAGNOSIS — IMO0002 Reserved for concepts with insufficient information to code with codable children: Secondary | ICD-10-CM

## 2012-08-10 DIAGNOSIS — Z79899 Other long term (current) drug therapy: Secondary | ICD-10-CM

## 2012-08-10 DIAGNOSIS — J9600 Acute respiratory failure, unspecified whether with hypoxia or hypercapnia: Secondary | ICD-10-CM

## 2012-08-10 DIAGNOSIS — J439 Emphysema, unspecified: Secondary | ICD-10-CM | POA: Diagnosis present

## 2012-08-10 DIAGNOSIS — R0902 Hypoxemia: Secondary | ICD-10-CM | POA: Diagnosis present

## 2012-08-10 DIAGNOSIS — T380X5A Adverse effect of glucocorticoids and synthetic analogues, initial encounter: Secondary | ICD-10-CM | POA: Diagnosis present

## 2012-08-10 DIAGNOSIS — M899 Disorder of bone, unspecified: Secondary | ICD-10-CM | POA: Diagnosis present

## 2012-08-10 DIAGNOSIS — E871 Hypo-osmolality and hyponatremia: Secondary | ICD-10-CM | POA: Diagnosis not present

## 2012-08-10 DIAGNOSIS — Z7982 Long term (current) use of aspirin: Secondary | ICD-10-CM

## 2012-08-10 DIAGNOSIS — E785 Hyperlipidemia, unspecified: Secondary | ICD-10-CM | POA: Diagnosis present

## 2012-08-10 LAB — BASIC METABOLIC PANEL
BUN: 14 mg/dL (ref 6–23)
CO2: 25 mEq/L (ref 19–32)
Calcium: 10.1 mg/dL (ref 8.4–10.5)
Creatinine, Ser: 0.75 mg/dL (ref 0.50–1.35)
Glucose, Bld: 116 mg/dL — ABNORMAL HIGH (ref 70–99)

## 2012-08-10 LAB — CBC
MCH: 30.9 pg (ref 26.0–34.0)
MCV: 97.7 fL (ref 78.0–100.0)
Platelets: 301 10*3/uL (ref 150–400)
RBC: 4.44 MIL/uL (ref 4.22–5.81)
RDW: 14.3 % (ref 11.5–15.5)

## 2012-08-10 MED ORDER — BUDESONIDE-FORMOTEROL FUMARATE 160-4.5 MCG/ACT IN AERO
2.0000 | INHALATION_SPRAY | Freq: Two times a day (BID) | RESPIRATORY_TRACT | Status: DC
  Administered 2012-08-10 – 2012-08-16 (×13): 2 via RESPIRATORY_TRACT
  Filled 2012-08-10: qty 6

## 2012-08-10 MED ORDER — OXYCODONE-ACETAMINOPHEN 5-325 MG PO TABS
1.0000 | ORAL_TABLET | Freq: Four times a day (QID) | ORAL | Status: DC | PRN
  Administered 2012-08-11 – 2012-08-17 (×9): 2 via ORAL
  Filled 2012-08-10 (×9): qty 2

## 2012-08-10 MED ORDER — ALBUTEROL SULFATE (5 MG/ML) 0.5% IN NEBU
2.5000 mg | INHALATION_SOLUTION | Freq: Four times a day (QID) | RESPIRATORY_TRACT | Status: DC
  Administered 2012-08-10 – 2012-08-11 (×2): 2.5 mg via RESPIRATORY_TRACT
  Filled 2012-08-10 (×2): qty 0.5

## 2012-08-10 MED ORDER — ALBUTEROL SULFATE (5 MG/ML) 0.5% IN NEBU
2.5000 mg | INHALATION_SOLUTION | RESPIRATORY_TRACT | Status: DC | PRN
  Administered 2012-08-11 – 2012-08-26 (×16): 2.5 mg via RESPIRATORY_TRACT
  Filled 2012-08-10 (×16): qty 0.5

## 2012-08-10 MED ORDER — FENTANYL CITRATE 0.05 MG/ML IJ SOLN: 25.0000 ug | INTRAMUSCULAR | Status: DC | PRN

## 2012-08-10 MED ORDER — SODIUM CHLORIDE 0.9 % IV SOLN
INTRAVENOUS | Status: DC
  Administered 2012-08-10: 20 mL/h via INTRAVENOUS

## 2012-08-10 MED ORDER — ASPIRIN 81 MG PO CHEW: 324.0000 mg | CHEWABLE_TABLET | ORAL | Status: DC

## 2012-08-10 MED ORDER — DOXYCYCLINE HYCLATE 100 MG PO TABS: ORAL_TABLET | ORAL | Status: DC

## 2012-08-10 MED ORDER — PREDNISONE 20 MG PO TABS
20.0000 mg | ORAL_TABLET | Freq: Every day | ORAL | Status: DC
  Administered 2012-08-11 – 2012-08-16 (×6): 20 mg via ORAL
  Filled 2012-08-10 (×8): qty 1

## 2012-08-10 MED ORDER — PROPAFENONE HCL 225 MG PO TABS
225.0000 mg | ORAL_TABLET | Freq: Two times a day (BID) | ORAL | Status: DC
Start: 2012-08-10 — End: 2012-08-17
  Administered 2012-08-10 – 2012-08-16 (×13): 225 mg via ORAL
  Filled 2012-08-10 (×15): qty 1

## 2012-08-10 MED ORDER — DOXYCYCLINE HYCLATE 100 MG PO TABS
100.0000 mg | ORAL_TABLET | Freq: Two times a day (BID) | ORAL | Status: AC
  Administered 2012-08-10 – 2012-08-16 (×13): 100 mg via ORAL
  Filled 2012-08-10 (×13): qty 1

## 2012-08-10 MED ORDER — ASPIRIN 300 MG RE SUPP: 300.0000 mg | RECTAL | Status: DC

## 2012-08-10 MED ORDER — IPRATROPIUM BROMIDE 0.02 % IN SOLN
0.5000 mg | Freq: Four times a day (QID) | RESPIRATORY_TRACT | Status: DC
  Administered 2012-08-10 – 2012-08-11 (×2): 0.5 mg via RESPIRATORY_TRACT
  Filled 2012-08-10 (×2): qty 2.5

## 2012-08-10 MED ORDER — ASPIRIN EC 81 MG PO TBEC
81.0000 mg | DELAYED_RELEASE_TABLET | Freq: Every day | ORAL | Status: DC
  Administered 2012-08-11 – 2012-08-16 (×6): 81 mg via ORAL
  Filled 2012-08-10 (×7): qty 1

## 2012-08-10 MED ORDER — PREDNISONE 20 MG PO TABS: 20.0000 mg | ORAL_TABLET | Freq: Every day | ORAL | Status: DC

## 2012-08-10 MED ORDER — METHYLPREDNISOLONE ACETATE 80 MG/ML IJ SUSP
80.0000 mg | Freq: Once | INTRAMUSCULAR | Status: AC
  Administered 2012-08-10: 80 mg via INTRAMUSCULAR

## 2012-08-10 MED ORDER — VERAPAMIL HCL ER 240 MG PO TBCR
240.0000 mg | EXTENDED_RELEASE_TABLET | Freq: Every day | ORAL | Status: DC
  Administered 2012-08-10 – 2012-08-30 (×20): 240 mg via ORAL
  Filled 2012-08-10 (×22): qty 1

## 2012-08-10 MED ORDER — SODIUM CHLORIDE 0.9 % IV SOLN: 250.0000 mL | INTRAVENOUS | Status: DC | PRN

## 2012-08-10 MED ORDER — OXYCODONE-ACETAMINOPHEN 5-325 MG PO TABS
2.0000 | ORAL_TABLET | Freq: Once | ORAL | Status: AC
  Administered 2012-08-10: 2 via ORAL
  Filled 2012-08-10: qty 2

## 2012-08-10 NOTE — Patient Instructions (Signed)
Scripts for prednisone and doxycycline sent  Depo 80  Order CXR   Dx COPD exacerbation

## 2012-08-10 NOTE — ED Notes (Signed)
Pt brought back to trauma room. EDP at the bedside. Pt placed on NRB, per EDP order.

## 2012-08-10 NOTE — H&P (Signed)
PULMONARY  / CRITICAL CARE MEDICINE  Name: Mark [name] MRN: [id number] DOB: 06-24-1941    LOS:   REFERRING PROVIDER:  [name]   CHIEF COMPLAINT:  Pneumothorax  BRIEF PATIENT DESCRIPTION: [name], former smoker,  with PMH of steroid / O2 dependent (20mg ) COPD, Chronic Afib who was seen in Pulmonary Office 12/17 for 3 day history of worsening shortness of breath, decreased O2 levels, decreased energy.  Found to have moderate R pneumothorax.  Admitted for further care.    LINES / TUBES: 12/17 R CT >>>  CULTURES:   ANTIBIOTICS: 12/16 Doxycycline (AECOPD)>>>>  SIGNIFICANT EVENTS:  12/16 - started on doxy for worsening SOB, decreased sats 12/17 - Acute office visit for above, found to have R basilar 20% PTX  LEVEL OF CARE:  SDU PRIMARY SERVICE:  PCCM CONSULTANTS:   CODE STATUS:   DIET:  Clear, advance to heart healthy once CT placed & resp. Status stablilizes DVT Px:  SCD's GI Px:  None indicated  HISTORY OF PRESENT ILLNESS:  [name], former smoker, with PMH of Afib, HLD, steroid / O2 dependent (20mg ) COPD, Chronic Afib who was seen in Pulmonary Office 12/17 for 3 day history of worsening shortness of breath, decreased O2 levels (86% despite 3L O2), decreased energy.  Had been given Rx for doxycycline 12/16.  Found to have moderate R pneumothorax.  Denies fevers, chills, weight loss, chest pain, chest pain with inspiration, hemoptysis, sputum production, edema.  Reports minimal cough and back pain.      PAST MEDICAL HISTORY :  Past Medical History  Diagnosis Date  . Atrial fibrillation   . Hyperlipidemia   . Weakness   . Dyspnea   . Headache   . Osteopenia   . COPD (chronic obstructive pulmonary disease)   . Allergic rhinitis    Past Surgical History  Procedure Date  . Catheter ablation   . Cataract extraction, bilateral   . Appendectomy   . Inguinal hernia repair    Prior to Admission medications   Medication Sig Start Date End Date Taking? Authorizing  Provider  acetaminophen (TYLENOL) 325 MG tablet Take 650 mg by mouth as needed. Per bottle     Historical Provider, MD  albuterol (PROAIR HFA) 108 (90 BASE) MCG/ACT inhaler Inhale 2 puffs into the lungs every 4 (four) hours as needed.      Historical Provider, MD  albuterol (PROVENTIL) (2.5 MG/3ML) 0.083% nebulizer solution Take 3 mLs (2.5 mg total) by nebulization every 6 (six) hours as needed for wheezing. 08/08/11 09/09/12  Waymon Budge, MD  aspirin EC 81 MG tablet Take 1 tablet (81 mg total) by mouth daily. 09/02/11   Duke Salvia, MD  budesonide-formoterol [facility]) 160-4.5 MCG/ACT inhaler Inhale 2 puffs into the lungs 2 (two) times daily. Rinse mouth 03/01/12 03/01/13  Waymon Budge, MD  Calcium-Magnesium-Vitamin D (ONE-A-DAY CALCIUM PLUS) 500-50-100 MG-MG-UNIT CHEW Chew 1 tablet by mouth 2 (two) times daily.      Historical Provider, MD  doxycycline (VIBRA-TABS) 100 MG tablet Take 2 today, then one daily until gone 08/10/12 08/10/13  Waymon Budge, MD  EPINEPHrine (EPIPEN 2-PAK) 0.3 mg/0.3 mL DEVI Use as directed 05/08/11   Waymon Budge, MD  guaiFENesin (MUCINEX) 600 MG 12 hr tablet Take 1-2 mg by mouth every 12 (twelve) hours as needed.      Historical Provider, MD  ipratropium-albuterol (DUONEB) 0.5-2.5 (3) MG/3ML SOLN Take 3 mLs by nebulization 4 (four) times daily as needed. 08/08/11  Waymon Budge, MD  magnesium oxide (MAG-OX) 400 MG tablet Take 1 tablet (400 mg total) by mouth daily. 09/02/11   Duke Salvia, MD  Multiple Vitamins-Minerals (CENTRUM SILVER PO) Take 1 tablet by mouth daily.      Historical Provider, MD  predniSONE (DELTASONE) 20 MG tablet Take 1 tablet (20 mg total) by mouth daily. Or as directed 08/10/12 08/10/13  Waymon Budge, MD  propafenone (RYTHMOL) 225 MG tablet Take one tablet by mouth twice daily 01/21/12   Duke Salvia, MD  sodium chloride (OCEAN) 0.65 % nasal spray 1 spray by Nasal route 2 (two) times daily.      Historical Provider, MD  verapamil  (CALAN-SR) 240 MG CR tablet Take 1 tablet (240 mg total) by mouth at bedtime. 11/20/11   Duke Salvia, MD   Allergies  Allergen Reactions  . Shrimp (Shellfish Allergy)   . Zolpidem Tartrate     FAMILY HISTORY:  Family History  Problem Relation Age of Onset  . Heart attack Father   . COPD Brother     deceased  . COPD Sister   . COPD Father   . Lung cancer Father    SOCIAL HISTORY:  reports that he has quit smoking. His smoking use included Cigarettes. He smoked 3 packs per day. He quit smokeless tobacco use about 46 years ago. His alcohol and drug histories not on file.  REVIEW OF SYSTEMS:   All systems reviewed and are negative except for HPI.    INTERVAL HISTORY:  Sitting up on stretcher, accessory muscle use  VITAL SIGNS: Pulse Rate:  [113] 113  (12/17 1512) BP: (130)/(68) 130/68 mmHg (12/17 1512) SpO2:  [90 %] 90 % (12/17 1512) Weight:  [165 lb (74.844 kg)] 165 lb (74.844 kg) (12/17 1512)  PHYSICAL EXAMINATION: General:  Frail elderly male, accessory muscle use Neuro:  AAOx4, short of breath but appropriate HEENT:  Mm pink/moist, no JVD Cardiovascular:  s1s2 rrr, no m/r/g Lungs:  resp's labored with accessory muscle use, lungs diminished R>L Abdomen:  Distended, nontender, bsx4 active Musculoskeletal:  No acute deformities Skin:  Warm/dry  No results found for this basename: NA:3,K:3,CL:3,CO2:3,BUN:3,CREATININE:3,GLUCOSE:3 in the last 168 hours No results found for this basename: HGB:3,HCT:3,WBC:3,PLT:3 in the last 168 hours Dg Chest 2 View  08/10/2012  *RADIOLOGY REPORT*  Clinical Data: Shortness of breath, COPD exacerbation, asthma, former smoker  CHEST - 2 VIEW  Comparison: 02/06/2010  Findings: Normal heart size, mediastinal contours, and pulmonary vascularity. Changes of severe COPD with bilateral upper lobe scarring and right apex bullous disease. Moderate sized pneumothorax in lower right chest with partial collapse of the right middle and right lower lobes. No  mediastinal shift. Minimal atelectasis or scarring in mid left lung. Diffuse osseous demineralization.  IMPRESSION: Severe COPD with a moderate-sized pneumothorax in the lower right chest with partial collapse of the right middle and right lower lobes. No evidence of mediastinal shift/tension.  Critical Value/emergent results were called by telephone at the time of interpretation on 08/10/2012 at 1613 hours to Katie RN at Dr. Roxy Cedar, who verbally acknowledged these results.   Original Report Authenticated By: Ulyses Southward, M.D.     ASSESSMENT / PLAN:  Spontaneous R Pneumothorax - acute basilar PTX noted on CXR 12/17 in setting of dyspnea, hypoxemia Severe COPD - baseline oxygen 3L and prednisone 20 mg / day.  Followed by [name].    PLAN: -admit to SDU -consent for chest tube placement -placement of wayne PTX catheter -f/u  cxr post placement  -20 cm suction post placement -continue baseline prednisone -continue symbicort, duonebs -continue doxycycline -pulmonary hygiene -rapidly wean O2 once chest tube placed, goal sats 88-92%   Paroxysmal Atrial Fibrillation HLD  PLAN: -continue ASA in am post chest tube placement -continue verapamil, rythmol -enzymes x1    Canary Brim, NP-C Pulmonary and Critical Care Medicine [facility] Pager: [phone]  08/10/2012, 5:08 PM  I have interviewed and examined the patient and reviewed the database. I have formulated the assessment and plan as reflected in the note above with amendments made by me.   Billy Fischer, MD;  PCCM service; Mobile [phone]

## 2012-08-10 NOTE — ED Notes (Signed)
Xray at the bedside.

## 2012-08-10 NOTE — ED Notes (Signed)
Pt presents to department for evaluation of shortness of breath. States he has been progressively more short of breath over the past several days. Wears 3L O2 at home. Also states diffuse chest pain. Pt is conscious alert and oriented x4. Was told to come to ED today by PCP due to R collapsed lung. Respirations labored. Speaking short sentences at the time. Skin dry.

## 2012-08-10 NOTE — ED Notes (Signed)
PCCM at the bedside 

## 2012-08-10 NOTE — ED Provider Notes (Signed)
History     CSN: [id number]  Arrival date & time 08/10/12  1706   First MD Initiated Contact with Patient 08/10/12 1723      Chief Complaint  Patient presents with  . Shortness of Breath    (Consider location/radiation/quality/duration/timing/severity/associated sxs/prior treatment) Patient is a 71 y.o. male presenting with shortness of breath. The history is provided by the patient.  Shortness of Breath  The current episode started today. The onset was gradual. The problem occurs continuously. The problem has been unchanged. The problem is moderate. Nothing relieves the symptoms. The symptoms are aggravated by activity. Associated symptoms include shortness of breath. Pertinent negatives include no chest pain, no chest pressure, no fever, no cough and no wheezing. He is currently using steroids.    Past Medical History  Diagnosis Date  . Atrial fibrillation   . Hyperlipidemia   . Weakness   . Dyspnea   . Headache   . Osteopenia   . COPD (chronic obstructive pulmonary disease)   . Allergic rhinitis     Past Surgical History  Procedure Date  . Catheter ablation   . Cataract extraction, bilateral   . Appendectomy   . Inguinal hernia repair     Family History  Problem Relation Age of Onset  . Heart attack Father   . COPD Brother     deceased  . COPD Sister   . COPD Father   . Lung cancer Father     History  Substance Use Topics  . Smoking status: Former Smoker -- 3.0 packs/day    Types: Cigarettes  . Smokeless tobacco: Former Neurosurgeon    Quit date: 09/01/1965  . Alcohol Use: No      Review of Systems  Constitutional: Negative for fever.  Respiratory: Positive for shortness of breath. Negative for cough and wheezing.   Cardiovascular: Negative for chest pain.  All other systems reviewed and are negative.    Allergies  Shrimp and Zolpidem tartrate  Home Medications   Current Outpatient Rx  Name  Route  Sig  Dispense  Refill  . ACETAMINOPHEN 325 MG  PO TABS   Oral   Take 650 mg by mouth every 6 (six) hours as needed. For pain         . ALBUTEROL SULFATE HFA 108 (90 BASE) MCG/ACT IN AERS   Inhalation   Inhale 2 puffs into the lungs every 4 (four) hours as needed. For shortness of breath/wheezing         . ALBUTEROL SULFATE (2.5 MG/3ML) 0.083% IN NEBU   Nebulization   Take 3 mLs (2.5 mg total) by nebulization every 6 (six) hours as needed for wheezing.   75 mL   12   . ASPIRIN EC 81 MG PO TBEC   Oral   Take 1 tablet (81 mg total) by mouth daily.   150 tablet   2   . BUDESONIDE-FORMOTEROL FUMARATE 160-4.5 MCG/ACT IN AERO   Inhalation   Inhale 2 puffs into the lungs 2 (two) times daily. Rinse mouth   3 Inhaler   3   . CALCIUM-MAGNESIUM-VITAMIN D 500-50-100 MG-MG-UNIT PO CHEW   Oral   Chew 1 tablet by mouth 2 (two) times daily.           Marland Kitchen DOXYCYCLINE HYCLATE 100 MG PO TABS   Oral   Take 100-200 mg by mouth daily. Take 2 today, then one daily until gone; Start date 08/10/12, Duration unknown         .  EPINEPHRINE 0.3 MG/0.3ML IJ DEVI   Subcutaneous   Inject 0.3 mg into the skin daily as needed. For anaphylaxis         . GUAIFENESIN ER 600 MG PO TB12   Oral   Take 1-2 mg by mouth every 12 (twelve) hours as needed. For cough         . IPRATROPIUM-ALBUTEROL 0.5-2.5 (3) MG/3ML IN SOLN   Nebulization   Take 3 mLs by nebulization 4 (four) times daily as needed.   360 mL   11   . MAGNESIUM OXIDE 400 MG PO TABS   Oral   Take 1 tablet (400 mg total) by mouth daily.         . CENTRUM SILVER PO   Oral   Take 1 tablet by mouth daily.           Marland Kitchen PREDNISONE 20 MG PO TABS   Oral   Take 20 mg by mouth daily.         Marland Kitchen PROPAFENONE HCL 225 MG PO TABS   Oral   Take 225 mg by mouth 2 (two) times daily.         Marland Kitchen SALINE NASAL SPRAY 0.65 % NA SOLN   Nasal   1 spray by Nasal route 2 (two) times daily.           Marland Kitchen VERAPAMIL HCL ER 240 MG PO TBCR   Oral   Take 1 tablet (240 mg total) by mouth at  bedtime.   30 tablet   9     BP 130/92  Pulse 118  Temp 98.7 F (37.1 C) (Oral)  Resp 26  SpO2 87%  Physical Exam  Nursing note and vitals reviewed. Constitutional: He is oriented to person, place, and time. He appears well-developed and well-nourished. He appears distressed.  HENT:  Head: Normocephalic and atraumatic.  Mouth/Throat: No oropharyngeal exudate.  Eyes: EOM are normal. Pupils are equal, round, and reactive to light.  Neck: Normal range of motion. Neck supple.  Cardiovascular: Normal rate and regular rhythm.  Exam reveals no friction rub.   No murmur heard. Pulmonary/Chest: He is in respiratory distress (mild). He has decreased breath sounds (R mid/lower). He has no wheezes. He has no rales.  Abdominal: He exhibits no distension. There is no tenderness. There is no rebound.  Musculoskeletal: Normal range of motion. He exhibits no edema.  Neurological: He is alert and oriented to person, place, and time.  Skin: He is not diaphoretic.    ED Course  Procedures (including critical care time)  Labs Reviewed - No data to display Dg Chest 2 View  08/10/2012  *RADIOLOGY REPORT*  Clinical Data: Shortness of breath, COPD exacerbation, asthma, former smoker  CHEST - 2 VIEW  Comparison: 02/06/2010  Findings: Normal heart size, mediastinal contours, and pulmonary vascularity. Changes of severe COPD with bilateral upper lobe scarring and right apex bullous disease. Moderate sized pneumothorax in lower right chest with partial collapse of the right middle and right lower lobes. No mediastinal shift. Minimal atelectasis or scarring in mid left lung. Diffuse osseous demineralization.  IMPRESSION: Severe COPD with a moderate-sized pneumothorax in the lower right chest with partial collapse of the right middle and right lower lobes. No evidence of mediastinal shift/tension.  Critical Value/emergent results were called by telephone at the time of interpretation on 08/10/2012 at 1613 hours  to Katie RN at Dr. Roxy Cedar, who verbally acknowledged these results.   Original Report Authenticated By: Ulyses Southward, M.D.  1. Spontaneous pneumothorax   2. Atrial fibrillation   3. COPD (chronic obstructive pulmonary disease) with chronic bronchitis      Date: 08/10/2012  Rate: 112  Rhythm: sinus tachycardia  QRS Axis: indeterminate  Intervals: normal  ST/T Wave abnormalities: normal  Conduction Disutrbances:none  Narrative Interpretation:   Old EKG Reviewed: none available    MDM  71 year old male presents with shortness of breath. Has been feeling sluggish and weak with dyspnea on exertion or the past 3 days. Has history of severe COPD. Had outpatient x-ray done today, called back to come to the hospital with cortical was all of a large right-sided pneumothorax. On arrival, hypoxic on 3 L, which is his baseline. On nonrebreather. He was tachycardic in the 110s. Normotensive at this time. On x-ray, pneumothorax layers inferiorly with larger area inferiorly. Concern this could be a large bleb. Pulm/Critical Care awaiting patient in ED. States this is not a bleb. Pigtail chest tube catheter placed by Pulm/CC at the bedside. Admitted by Pulm.   Elwin Mocha, MD 08/10/12 2359  Elwin Mocha, MD 08/11/12 0000

## 2012-08-10 NOTE — Procedures (Signed)
Chest Tube Insertion Procedure Note  Indications:  Clinically significant Pneumothorax  Pre-operative Diagnosis: Pneumothorax  Post-operative Diagnosis: Pneumothorax  Procedure Details  Informed consent was obtained for the procedure, including sedation.  Risks of lung perforation, hemorrhage, arrhythmia, and adverse drug reaction were discussed.   After sterile skin prep, using standard technique, a Wayne catheter tube was placed in the right lateral 8th rib space.  Findings: CXR: Full re-expansion, good position of catheter  Estimated Blood Loss:  Minimal         Specimens:  None              Complications:  None; patient tolerated the procedure well.         Disposition: SDU         Condition: stable  Attending Attestation: I performed the procedure.  Billy Fischer, MD ; [facility] [phone].  After 5:30 PM or weekends, call [phone]

## 2012-08-10 NOTE — ED Notes (Signed)
Dr. Sung Amabile at the bedside to place chest tube.

## 2012-08-10 NOTE — ED Notes (Signed)
Chest tube inserted without difficulty. Pt placed back on [place] @3l . No distress noted. Family remains at the bedside.

## 2012-08-10 NOTE — Progress Notes (Signed)
Patient ID: Mark [name], male    DOB: [birth year], 71 y.o.   MRN: [id number]  HPI 01/17/11- 51 yo with respiratory failure/ COPD, chronic AFib. Wife here Last here 11/04/10- Note reviewed Got a cold 2 weeks ago. Initally better. In last week-10 days has had more malaise, cough esp in last 3 days. Last antibiotic 2-3 months ago.  Went back up to 20 mg daily prednisone. Was never able to wean off last year when we tried. He has been on steroids many years..   05/07/11-  52 yo M former 3 PPD smoker with respiratory failure/ COPD, chronic AFib. Wife here No major changes. Uses nebulizer 3-5x/ day, infrequent use of rescue inhaler.Stays on prednisone 20 mg daily. He couldn't function at 15 mg daily.No recent infection. Fought off a mild cold a few weeks ago. He used his standby doxy for that and feels it helped. Daily mucinex. Daily cough with some phlegm. For flu vax today. Oxygen continuous- 1.5 - 2 L/M.  In past few weeks has noted pain down from right hip to ankle- discussed as likely pinched nerve/ compression fx from steroids. Spends much of each day sitting.   11/05/11-70 yo M former 3 PPD smoker with respiratory failure/ COPD/ steroid dependent, chronic AFib. Wife here. PCP Dr [name] Had chest cold at [facility] but finally better in past month. Remains on prednisone 20 mg daily. He has been able to be much more active, feeling better. Up and around more in house on O2 2 L/M with less need for wheelchair and with no acute issues. Denies infection, blood, chest pain. Had injections for back pain.   05/07/12- 77 yo M former 3 PPD smoker with respiratory failure/ COPD/ steroid dependent, chronic AFib.   Wife here.  PCP Dr [name] Denies any SOB, wheezing, cough, or congestion for about the past 2 weeks Remains on oxygen 3 L/[employer].  They plan to fly to [facility] for a family wedding. I filled out the airplane form. They will be taking a portable concentrator. We discussed oxygen, altitude and  exertion. He remains steroid dependent 20 mg prednisone daily.  08/10/12- 67 yo M former 3 PPD smoker with respiratory failure/ COPD/ steroid dependent, chronic AFib.   Wife here.  PCP Dr [name] ACUTE VISIT: since Sunday having a hard time keep in O2 levels up even with O2; no energy and unable to do much activites. Came back in wheelchair and O2 was 86% on 3l/m cont. Had had a cold and started doxycycline yesterday. Denies fever,  chest pain, palpitation, little cough, no blood, no edema. Some pain across upper back and shoulders.  Review of Systems- see HPI Constitutional:   No-   weight loss, night sweats, fevers, chills, fatigue, lassitude. HEENT:   No-  headaches, difficulty swallowing, tooth/dental problems, sore throat,       No-  sneezing, itching, ear ache, nasal congestion, post nasal drip,  CV:  No-   chest pain, orthopnea, PND, no recent swelling in lower extremities, no-anasarca, dizziness, palpitations Resp: + shortness of breath with exertion or at rest.              Little cough,  No-  coughing up of blood.              No-   change in color of mucus.  Slight wheezing.   Skin: No-   rash or lesions. GI:  No-   heartburn, indigestion, abdominal pain, nausea, vomiting, GU: No-  dysuria,  MS:  No-   joint pain or swelling.  + back pain- HPI Neuro- nothing unusual  Psych:  No- change in mood or affect. No depression or anxiety.  No memory loss.  Objective:   Physical Exam BP 130/68  Pulse 113  Ht 5\' 11"  (1.803 m)  Wt 165 lb (74.844 kg)  BMI 23.01 kg/m2  SpO2 90% General- Alert, Oriented, Affect-appropriate, Distress- none acute, wheelchair, O2   3 l/m . Skin- rash-none, lesions- none, excoriation- none. Steroid fragility and echymoses Lymphadenopathy- none Head- atraumatic            Eyes- Gross vision intact, PERRLA, conjunctivae clear secretions            Ears- Hearing, canals normal            Nose- Clear, No-Septal dev, mucus, polyps, erosion, perforation              Throat- Mallampati II , mucosa clear , drainage- none, tonsils- atrophic Neck- flexible , trachea midline, no stridor , thyroid nl, carotid no bruit Chest - symmetrical excursion , unlabored           Heart/CV- almost regular to my exam (no pacemaker) , no murmur , no gallop  , no rub, nl s1 s2                            JVD- none , edema- trace, stasis changes- none, varices- none           Lung- +Very distant bilaterally, w/ diffuse slow expiratory wheeze, cough- none , dullness-none, rub- none.+ Pursed lips.           Chest wall-  Abd- Br/ Gen/ Rectal- Not done, not indicated Extrem- cyanosis- none, clubbing, none, atrophy- none, strength- nl Neuro- grossly intact to observation

## 2012-08-10 NOTE — Assessment & Plan Note (Signed)
Rhythm is very regular to exam today- may be RSR.

## 2012-08-10 NOTE — Assessment & Plan Note (Signed)
Exacerbation of COPD associated with recent cold. Doesn't sound like pneumonia, but he recognizes status change in last 2 days. Plan - depomedrol, continue prednisone 20 mg daily, finish doxycycline started yesterday, CXR

## 2012-08-10 NOTE — Telephone Encounter (Signed)
I spoke with pt and he stated on Sunday his oxygen level was 72-84%. He began doxycycline. He is using his oxygen 4 liters and is in the low 90's. He is feeling congested and is having to clear his throat a lot. Pt is scheduled to see CDY at 3:15 today. Nothing further was needed

## 2012-08-11 ENCOUNTER — Inpatient Hospital Stay (HOSPITAL_COMMUNITY): Payer: Medicare Other

## 2012-08-11 LAB — BASIC METABOLIC PANEL
BUN: 17 mg/dL (ref 6–23)
Chloride: 100 mEq/L (ref 96–112)
Creatinine, Ser: 0.81 mg/dL (ref 0.50–1.35)
GFR calc Af Amer: >90 mL/min (ref >90)

## 2012-08-11 LAB — CBC
HCT: 39 % (ref 39.0–52.0)
MCV: 98 fL (ref 78.0–100.0)
RDW: 14.5 % (ref 11.5–15.5)
WBC: 12.8 10*3/uL — ABNORMAL HIGH (ref 4.0–10.5)

## 2012-08-11 LAB — GLUCOSE, CAPILLARY

## 2012-08-11 MED ORDER — MAGNESIUM HYDROXIDE 400 MG/5ML PO SUSP
30.0000 mL | Freq: Four times a day (QID) | ORAL | Status: DC | PRN
  Administered 2012-08-11: 30 mL via ORAL
  Filled 2012-08-11: qty 30

## 2012-08-11 MED ORDER — SIMETHICONE 80 MG PO CHEW
80.0000 mg | CHEWABLE_TABLET | Freq: Four times a day (QID) | ORAL | Status: DC | PRN
  Administered 2012-08-13 – 2012-08-19 (×7): 80 mg via ORAL
  Filled 2012-08-11 (×9): qty 1

## 2012-08-11 MED ORDER — DIPHENHYDRAMINE HCL 25 MG PO CAPS
25.0000 mg | ORAL_CAPSULE | Freq: Every evening | ORAL | Status: DC | PRN
  Administered 2012-08-11 – 2012-08-16 (×6): 25 mg via ORAL
  Filled 2012-08-11 (×6): qty 1

## 2012-08-11 MED ORDER — DOCUSATE SODIUM 100 MG PO CAPS
100.0000 mg | ORAL_CAPSULE | Freq: Two times a day (BID) | ORAL | Status: DC
  Administered 2012-08-11 – 2012-08-16 (×12): 100 mg via ORAL
  Filled 2012-08-11 (×13): qty 1

## 2012-08-11 NOTE — Progress Notes (Signed)
Utilization review completed.  P.J. Benjamim Harnish,RN,BSN Case Manager [phone]  

## 2012-08-11 NOTE — Progress Notes (Signed)
Patient transferred to 2019.  Report called to RN on 2000 and all questions answered. Patient transferred via wheelchair with NT.  Family called and message left with new room assignment.

## 2012-08-11 NOTE — Progress Notes (Signed)
PULMONARY  / CRITICAL CARE MEDICINE  Name: Mark [name] MRN: [id number] DOB: 02-Sep-1940    LOS: 1  REFERRING PROVIDER:  [name]   CHIEF COMPLAINT:  Pneumothorax  BRIEF PATIENT DESCRIPTION: [name], former smoker,  with PMH of steroid / O2 dependent (20mg ) COPD, Chronic Afib who was seen in Pulmonary Office 12/17 for 3 day history of worsening shortness of breath, decreased O2 levels, decreased energy.  Found to have moderate R pneumothorax.  Admitted for further care.    LINES / TUBES: 12/17 R CT >>>  CULTURES:   ANTIBIOTICS: 12/16 Doxycycline (AECOPD) >>   SIGNIFICANT EVENTS:  12/16 - started on doxy for worsening SOB, decreased sats 12/17 - Acute office visit for above, found to have R basilar 20% PTX  LEVEL OF CARE:  SDU PRIMARY SERVICE:  PCCM CONSULTANTS:   CODE STATUS:   DIET:  Clear, advance to heart healthy once CT placed & resp. Status stablilizes DVT Px:  SCD's GI Px:  None indicated    INTERVAL HISTORY:  No new complaints. No distress  VITAL SIGNS: Temp:  [97.3 F (36.3 C)-98.7 F (37.1 C)] 97.3 F (36.3 C) (12/18 0800) Pulse Rate:  [74-118] 75  (12/18 0800) Resp:  [10-26] 11  (12/18 0800) BP: (101-138)/(48-92) 111/66 mmHg (12/18 0800) SpO2:  [87 %-100 %] 97 % (12/18 0800) Weight:  [74.844 kg (165 lb)-75.5 kg (166 lb 7.2 oz)] 75.5 kg (166 lb 7.2 oz) (12/17 2045)  PHYSICAL EXAMINATION: General:  NAD Neuro:  No focal deficits HEENT:  WNL Cardiovascular:  RRR s M Lungs:  No wheezes Abdomen:  NT, NABS Ext: No edema    Lab 08/11/12 0345 08/10/12 1730  NA 139 138  K 3.9 4.4  CL 100 98  CO2 29 25  BUN 17 14  CREATININE 0.81 0.75  GLUCOSE 101* 116*    Lab 08/11/12 0345 08/10/12 1730  HGB 12.4* 13.7  HCT 39.0 43.4  WBC 12.8* 13.6*  PLT 263 301   CXR: small apical PTX persists  Chest tube: transient air leak with forceful cough only   IMPRESSION:  Spontaneous pneumothorax  Still with intermittent air leak and small apical PTX  H/O  atrial fibrillation  COPD with chronic bronchitis-oxygen dependent  Recently started on doxycycline for AECOPD  chronic pred @ 20 mg/day   PLAN: Cont chest tube on suction If fully re-expanded 12/19 - recommend water seal Transfer to Tele Cont anti-arrhythmics Cont diet and activity as tolerated  Billy Fischer, MD ; [facility] service Mobile [phone].  After 5:30 PM or weekends, call [phone]

## 2012-08-11 NOTE — Care Management Note (Signed)
    Page 1 of 2   08/30/2012     2:16:01 PM   CARE MANAGEMENT NOTE 08/30/2012  Patient:  Mark [name], Mark [name]   Account Number:  [id number]  Date Initiated:  08/11/2012  Documentation initiated by:  Donn Pierini  Subjective/Objective Assessment:   pt admitted with spont. pneumo     Action/Plan:   PTA pt lived at home with spouse- independent- uses home02   Anticipated DC Date:  08/31/2012   Anticipated DC Plan:  HOME W HOME HEALTH SERVICES      DC Planning Services  CM consult      [facility] Choice  HOME HEALTH   Choice offered to / List presented to:  C-1 Patient        HH arranged  HH-1 RN      [facility] agency  [facility]   Status of service:  Completed, signed off Medicare Important Message given?   (If response is "NO", the following Medicare IM given date Seide will be blank) Date Medicare IM given:   Date Additional Medicare IM given:    Discharge Disposition:  HOME W HOME HEALTH SERVICES  Per UR Regulation:  Reviewed for med. necessity/level of care/duration of stay  If discussed at Long Length of Stay Meetings, dates discussed:   08/17/2012  08/24/2012    Comments:  1/6 1414p debbie Thamas Appleyard rn,bsn spoke w pt and went over list of hhc agencies. he has used [facility] and wants them again. ref to mary w [facility] for hhrn.  08/24/12- 1140- Donn Pierini RN, BSN [phone] Pt still has Chest Tubes x2 with air leak- NCM to cont. to follow - plan remains to return home.  08-13-12 3:40pm Avie Arenas, RNBSN [phone] CT dc'd today - hope to dc home tomorrow.  states has a tank of oxygen at home that someone can bring in for him to get home on dc.  08/11/12- 1000- Donn Pierini RN, BSN [phone] Spoke with pt at bedside- per conversation pt states that he has home O2 that he uses cont. at 2L- gets this through [place]. Has used HH services in past through [facility]- but is not active at present. Pt reports that he gets his medications through both local pharmacy and mail  order. NCM to follow for d/c planning and needs. pt to tx out of SDU today

## 2012-08-12 ENCOUNTER — Inpatient Hospital Stay (HOSPITAL_COMMUNITY): Payer: Medicare Other

## 2012-08-12 MED ORDER — HEPARIN SODIUM (PORCINE) 5000 UNIT/ML IJ SOLN
5000.0000 [IU] | Freq: Three times a day (TID) | INTRAMUSCULAR | Status: DC
  Administered 2012-08-12 – 2012-08-16 (×12): 5000 [IU] via SUBCUTANEOUS
  Filled 2012-08-12 (×20): qty 1

## 2012-08-12 NOTE — ED Provider Notes (Signed)
I have supervised the resident on the management of this patient and agree with the note above. I personally interviewed and examined the patient and my addendum is below.   Mark [name] is a 71 y.o. male hx of severe COPD here with SOB. SOB for the last 3 days. Had outpatient xray that showed pneumothorax. He is tachypneic and hypoxic on 3L. No tracheal deviation and on nonrebreather, he is oxygenating well. He has decreased breath sounds on the R side. Xray showed pneumothorax layering inferiorly with no tension. Pulmonary critical care evaluated the patient and placed pigtail chest tube. Patient admitted to step down for monitoring.   CRITICAL CARE Performed by: Silverio Lay, DAVID   Total critical care time: 30 min   Critical care time was exclusive of separately billable procedures and treating other patients.  Critical care was necessary to treat or prevent imminent or life-threatening deterioration.  Critical care was time spent personally by me on the following activities: development of treatment plan with patient and/or surrogate as well as nursing, discussions with consultants, evaluation of patient's response to treatment, examination of patient, obtaining history from patient or surrogate, ordering and performing treatments and interventions, ordering and review of laboratory studies, ordering and review of radiographic studies, pulse oximetry and re-evaluation of patient's condition.    Richardean Canal, MD 08/12/12 [phone]

## 2012-08-12 NOTE — Progress Notes (Signed)
PULMONARY  / CRITICAL CARE MEDICINE  Name: Mark [name] MRN: [id number] DOB: 11/04/1940    LOS: 2  REFERRING PROVIDER:  [name]   CHIEF COMPLAINT:  Pneumothorax  BRIEF PATIENT DESCRIPTION: [name], former smoker,  with PMH of steroid / O2 dependent (20mg ) COPD, Chronic Afib who was seen in Pulmonary Office 12/17 for 3 day history of worsening shortness of breath, decreased O2 levels, decreased energy.  Found to have moderate R pneumothorax.  CT placed and admitted for further care.    LINES / TUBES: 12/17 R CT >>>  CULTURES: MRSA screen 12/18 >>> negative  ANTIBIOTICS: 12/16 Doxycycline (AECOPD) >>   SIGNIFICANT EVENTS:  12/16 - started on doxy for worsening SOB, decreased sats 12/17 - Acute office visit for above, found to have R basilar 20% PTX 12/19 - residual small apical ptx, no airleak  LEVEL OF CARE:  [place] PRIMARY SERVICE:  PCCM CONSULTANTS: None CODE STATUS:  Full DIET:  Modified carbohydrate DVT Px:  SQ heparin GI Px:  None indicated    INTERVAL HISTORY:  Bathing, significant SOB with exertion.  "Feel better than when I came in"  VITAL SIGNS: Temp:  [97.6 F (36.4 C)-98.5 F (36.9 C)] 98.5 F (36.9 C) (12/19 0501) Pulse Rate:  [76-102] 102  (12/18 2029) Resp:  [20-22] 20  (12/19 0501) BP: (119-149)/(66-82) 121/75 mmHg (12/19 0501) SpO2:  [92 %-97 %] 97 % (12/19 0501)  PHYSICAL EXAMINATION: General:  Elderly male, dyspnea with activity Neuro:  No focal deficits HEENT:  WNL Cardiovascular:  RRR s M Lungs:  No wheezes, decreased air movement on R Abdomen:  NT, soft / active bs Ext: No edema    Lab 08/11/12 0345 08/10/12 1730  NA 139 138  K 3.9 4.4  CL 100 98  CO2 29 25  BUN 17 14  CREATININE 0.81 0.75  GLUCOSE 101* 116*    Lab 08/11/12 0345 08/10/12 1730  HGB 12.4* 13.7  HCT 39.0 43.4  WBC 12.8* 13.6*  PLT 263 301   CXR: 12/19 small apical PTX persists  Chest tube: transient air leak with forceful cough only   IMPRESSION:   Spontaneous pneumothorax - Still with small apical PTX, but no airleak   H/O atrial fibrillation COPD with chronic bronchitis-oxygen dependent - Recently started on doxycycline for AECOPD.  chronic pred @ 20 mg/day   PLAN: Change chest tube on -10 cm suction and f/u CXR Continue anti-arrhythmics Add SQ heparin for DVT prophylaxis O2 for sats 88-92%  Canary Brim, NP-C [facility] Pgr: [phone] or [phone]   Reviewed above, examined pt, and agree with assessment/plan.  Will decrease to -10 cm H2O suction for chest tube and f/u CXR.  Coralyn Helling, MD [facility] Pulmonary/Critical Care 08/12/2012, 2:35 PM Pager:  [phone] After 3pm call: [phone]

## 2012-08-13 ENCOUNTER — Inpatient Hospital Stay (HOSPITAL_COMMUNITY): Payer: Medicare Other

## 2012-08-13 NOTE — Progress Notes (Signed)
CT removed per order. Pt tolerated well.. Vaseline dsg applied to site.  VSS stable. Will continue to monitor pt.

## 2012-08-13 NOTE — Progress Notes (Signed)
PULMONARY  / CRITICAL CARE MEDICINE  Name: Mark [name] MRN: [id number] DOB: [birth year]    LOS: 3  REFERRING PROVIDER:  [name]   CHIEF COMPLAINT:  Pneumothorax  BRIEF PATIENT DESCRIPTION: [name], former smoker,  with PMH of steroid / O2 dependent (20mg ) COPD, Chronic Afib who was seen in Pulmonary Office 12/17 for 3 day history of worsening shortness of breath, decreased O2 levels, decreased energy.  Found to have moderate R pneumothorax.  CT placed and admitted for further care.    LINES / TUBES: 12/17 R CT >>>  CULTURES: MRSA screen 12/18 >>> negative  ANTIBIOTICS: 12/16 Doxycycline (AECOPD) >>   SIGNIFICANT EVENTS:  12/16 - started on doxy for worsening SOB, decreased sats 12/17 - Acute office visit for above, found to have R basilar 20% PTX 12/19 - residual small apical ptx, no airleak  LEVEL OF CARE:  Telemetery PRIMARY SERVICE:  PCCM CONSULTANTS: None CODE STATUS:  Full DIET:  Modified carbohydrate DVT Px:  SQ heparin GI Px:  None indicated    INTERVAL HISTORY:  Up to chair eating lunch.  Looks great.  Indicates he has been walking to the bathroom -(removed suction some time last pm and has been off suction since).  Improved dyspnea.  Patient hopeful to go home - his grandson's wedding reception is tomorrow and he is hopeful to attend.    VITAL SIGNS: Temp:  [97.8 F (36.6 C)-97.9 F (36.6 C)] 97.9 F (36.6 C) (12/20 0540) Pulse Rate:  [80-104] 80  (12/20 0540) Resp:  [18-20] 18  (12/20 0540) BP: (126-153)/(62-84) 130/84 mmHg (12/20 0540) SpO2:  [95 %-100 %] 96 % (12/20 0818)  PHYSICAL EXAMINATION: General:  Elderly male, in NAD Neuro:  No focal deficits HEENT:  WNL Cardiovascular:  RRR s M Lungs:  No wheezes, diminished breath sounds  Abdomen:  NT, soft / active bs Ext: No edema    Lab 08/11/12 0345 08/10/12 1730  NA 139 138  K 3.9 4.4  CL 100 98  CO2 29 25  BUN 17 14  CREATININE 0.81 0.75  GLUCOSE 101* 116*    Lab 08/11/12 0345  08/10/12 1730  HGB 12.4* 13.7  HCT 39.0 43.4  WBC 12.8* 13.6*  PLT 263 301   CXR: 12/20 small apical PTX persists --film shot while pt was OFF suction  Chest tube: no air leak 12/20   IMPRESSION:  Spontaneous pneumothorax - Still with small apical PTX, but no airleak   H/O atrial fibrillation COPD with chronic bronchitis-oxygen dependent - Recently started on doxycycline for AECOPD.  Chronic pred @ 20 mg/day   PLAN: chest tube has inadvertently been on water seal since last pm, consider d/c with f/u cxr post removal, continues to have residual small PTX Continue anti-arrhythmics SQ heparin for DVT prophylaxis O2 for sats 88-92%    Canary Brim, NP-C [facility] Pgr: [phone] or [phone]   PCCM Attending:  I have interviewed and examined the patient and reviewed the database. I have formulated the assessment and plan as reflected in the note above with amendments made by me.   D/C chest tube today F/U CXR post removal CXR AM 12/21 - if no increase in tiny apical ptx, will be ready for discharge in AM 12/21  Billy Fischer, MD;  PCCM service; Mobile [phone]

## 2012-08-14 ENCOUNTER — Inpatient Hospital Stay (HOSPITAL_COMMUNITY): Payer: Medicare Other

## 2012-08-14 NOTE — Progress Notes (Signed)
PULMONARY  / CRITICAL CARE MEDICINE  Name: Mark [name] MRN: [id number] DOB: Jan 12, 1941    LOS: 4  REFERRING PROVIDER:  [name]   CHIEF COMPLAINT:  Pneumothorax  BRIEF PATIENT DESCRIPTION: [name], former smoker,  with PMH of steroid / O2 dependent (20mg ) COPD, Chronic Afib who was seen in Pulmonary Office 12/17 for 3 day history of worsening shortness of breath, decreased O2 levels, decreased energy.  Found to have moderate R pneumothorax.  CT placed and admitted for further care.    LINES / TUBES: 12/17 R CT >>>  CULTURES: MRSA screen 12/18 >>> negative  ANTIBIOTICS: 12/16 Doxycycline (AECOPD) >>   SIGNIFICANT EVENTS:  12/16 - started on doxy for worsening SOB, decreased sats 12/17 - Acute office visit for above, found to have R basilar 20% PTX 12/19 - residual small apical ptx, no airleak  LEVEL OF CARE:  Telemetery PRIMARY SERVICE:  PCCM CONSULTANTS: None CODE STATUS:  Full DIET:  Modified carbohydrate DVT Px:  SQ heparin GI Px:  None indicated    INTERVAL HISTORY:  Pt had chest tube removed, and cxr today with approx 40% ptx.  His right apex appears to be primarily stuck, preventing worse.  He still feels comfortable with his breathing, but notes that 20% worse from yesterday.  No increased wob by my evaluation today  VITAL SIGNS: Temp:  [98 F (36.7 C)-98.8 F (37.1 C)] 98 F (36.7 C) (12/21 0515) Pulse Rate:  [84-96] 84  (12/21 0515) Resp:  [18] 18  (12/21 0515) BP: (104-137)/(61-91) 104/61 mmHg (12/21 0515) SpO2:  [95 %-99 %] 95 % (12/21 0807) FiO2 (%):  [4 %] 4 % (12/21 0807)  PHYSICAL EXAMINATION: General:  Elderly male, in NAD Neuro:  Alert, oriented, moves all 4.  HEENT:  WNL Cardiovascular:  RRR  Lungs:  No wheezes, diminished breath sounds throughout Abdomen:  NT, soft / active bs Ext: No edema, no cyanosis    Lab 08/11/12 0345 08/10/12 1730  NA 139 138  K 3.9 4.4  CL 100 98  CO2 29 25  BUN 17 14  CREATININE 0.81 0.75  GLUCOSE 101*  116*    Lab 08/11/12 0345 08/10/12 1730  HGB 12.4* 13.7  HCT 39.0 43.4  WBC 12.8* 13.6*  PLT 263 301   CXR:  40% right basilar ptx following chest tube removal    IMPRESSION:  Spontaneous pneumothorax The pt has had recurrence with chest tube removal, and I suspect this indicates he may need vats for resolution.  I have given him the option of replacing chest tube vs trying high FiO2 for 24 hrs to see if it will improve on its own.  The pt is in no distress, and would like to try latter.  I would have a low threshold for thoracic consultation.    COPD with chronic bronchitis-oxygen dependent - Recently started on doxycycline for AECOPD.  Chronic pred @ 20 mg/day   PLAN: Will place on increased FiO2 for next 24hrs (has normal serum bicarb on admit), and repeat cxr in am.  If he has any worsening of his breathing, will need to have tube replaced.

## 2012-08-15 ENCOUNTER — Inpatient Hospital Stay (HOSPITAL_COMMUNITY): Payer: Medicare Other

## 2012-08-15 DIAGNOSIS — J9383 Other pneumothorax: Secondary | ICD-10-CM

## 2012-08-15 NOTE — Progress Notes (Signed)
eLink Physician-Brief Progress Note Patient Name: Mark [name] DOB: [birth year] MRN: [id number]  Date of Service  08/15/2012   HPI/Events of Note   Increased dyspnea.  40-50% pneumothorax on PCXR this AM - rounding MD after discussion with patient opted for conservative treatment with 100% O2 and observation.   eICU Interventions   STAT PCXR.  Asked Dr.  Belinda Block to evaluate, likely needs chest tube placement.    Intervention Category Intermediate Interventions: Respiratory distress - evaluation and management  Pearly Bartosik 08/15/2012, 12:49 AM

## 2012-08-15 NOTE — Progress Notes (Signed)
Pt complains of increased sob, vs sats 98% on venti mask, 129/82, 26 resp.Jeanene Erb md to make him aware of pt status. Orders placed. Fellow to come to unit.

## 2012-08-15 NOTE — Progress Notes (Signed)
PULMONARY  / CRITICAL CARE MEDICINE  Name: Mark [name] MRN: [id number] DOB: May 09, 1941    LOS: 5  REFERRING PROVIDER:  [name]   CHIEF COMPLAINT:  Pneumothorax  BRIEF PATIENT DESCRIPTION: [name], former smoker,  with PMH of steroid / O2 dependent (20mg ) COPD, Chronic Afib who was seen in Pulmonary Office 12/17 for 3 day history of worsening shortness of breath, decreased O2 levels, decreased energy.  Found to have moderate R pneumothorax.  CT placed and admitted for further care.    LINES / TUBES: 12/17 R CT >>>  CULTURES: MRSA screen 12/18 >>> negative  ANTIBIOTICS: 12/16 Doxycycline (AECOPD) >>   SIGNIFICANT EVENTS:  12/16 - started on doxy for worsening SOB, decreased sats 12/17 - Acute office visit for above, found to have R basilar 20% PTX 12/19 - residual small apical ptx, no airleak  LEVEL OF CARE:  Telemetery PRIMARY SERVICE:  PCCM CONSULTANTS: None CODE STATUS:  Full DIET:  Modified carbohydrate DVT Px:  SQ heparin GI Px:  None indicated    INTERVAL HISTORY:  Pt had recurrence of ptx post chest tube removal.  Had increased sob, and required placement of another tube.  His cxr today showed resolution of his ptx.  Pt currently in no distress or increased wob.   VITAL SIGNS: Temp:  [97.4 F (36.3 C)-97.5 F (36.4 C)] 97.5 F (36.4 C) (12/22 1341) Pulse Rate:  [75-105] 82  (12/22 1341) Resp:  [18-24] 18  (12/22 1341) BP: (125-140)/(68-87) 133/80 mmHg (12/22 1341) SpO2:  [93 %-98 %] 98 % (12/22 1341) FiO2 (%):  [40 %] 40 % (12/22 0100)  PHYSICAL EXAMINATION: General:  Elderly male, in NAD Neuro:  Alert, oriented, moves all 4.  HEENT:  WNL Cardiovascular:  RRR  Lungs:  No wheezes, diminished breath sounds throughout Abdomen:  NT, soft / active bs Ext: No edema, no cyanosis    Lab 08/11/12 0345 08/10/12 1730  NA 139 138  K 3.9 4.4  CL 100 98  CO2 29 25  BUN 17 14  CREATININE 0.81 0.75  GLUCOSE 101* 116*    Lab 08/11/12 0345 08/10/12 1730   HGB 12.4* 13.7  HCT 39.0 43.4  WBC 12.8* 13.6*  PLT 263 301   CXR:  Resolution of ptx with placement of ct.    IMPRESSION:  Spontaneous pneumothorax The pt has had recurrence with chest tube removal, and I suspect this indicates he may need vats for resolution. He now has had replacement of his ct with improved cxr and symtoms.  Will consider thoracic surg consult.   COPD with chronic bronchitis-oxygen dependent - Recently started on doxycycline for AECOPD.  Chronic pred @ 20 mg/day.  Currently without active bronchospasm   PLAN: 1) continue chest tube with suction 2) check cxr in am 3) no change in copd meds 4) consider t-surg consult.

## 2012-08-15 NOTE — Procedures (Signed)
Chest Tube Insertion Procedure Note  Indications:  Clinically significant Pneumothorax  Pre-operative Diagnosis: Pneumothorax  Post-operative Diagnosis: Pneumothorax  Procedure Details  Informed consent was obtained for the procedure, including sedation.  Risks of lung perforation, hemorrhage, arrhythmia, and adverse drug reaction were discussed.   After sterile skin prep, using standard technique, a 14 French tube was placed percutaneously using the seldinger technique in the right lateral 5th rib space.  Findings: None  Estimated Blood Loss:  Minimal         Specimens:  None              Complications:  None; patient tolerated the procedure well.         Disposition: Patient remained in his room          Condition: stable  Attending Attestation:

## 2012-08-15 NOTE — Plan of Care (Signed)
Problem: Problem: Respiratory Progression Goal: ABLE TO D/C CHEST TUBE Outcome: Not Progressing Pt developed another pneumo, and CT was replace.

## 2012-08-16 ENCOUNTER — Inpatient Hospital Stay (HOSPITAL_COMMUNITY): Payer: Medicare Other

## 2012-08-16 ENCOUNTER — Encounter (HOSPITAL_COMMUNITY): Payer: Self-pay | Admitting: Radiology

## 2012-08-16 DIAGNOSIS — J930 Spontaneous tension pneumothorax: Secondary | ICD-10-CM

## 2012-08-16 LAB — CBC
HCT: 45.6 % (ref 39.0–52.0)
HCT: 39.7 % (ref 39.0–52.0)
Hemoglobin: 14.9 g/dL (ref 13.0–17.0)
Hemoglobin: 12.4 g/dL — ABNORMAL LOW (ref 13.0–17.0)
MCH: 32.2 pg (ref 26.0–34.0)
MCHC: 32.7 g/dL (ref 30.0–36.0)
MCV: 98.5 fL (ref 78.0–100.0)
MCV: 98 fL (ref 78.0–100.0)
Platelets: 251 10*3/uL (ref 150–400)
Platelets: 238 10*3/uL (ref 150–400)
RBC: 4.63 MIL/uL (ref 4.22–5.81)
RBC: 4.05 MIL/uL — ABNORMAL LOW (ref 4.22–5.81)
RDW: 14.4 % (ref 11.5–15.5)
WBC: 13.5 10*3/uL — ABNORMAL HIGH (ref 4.0–10.5)
WBC: 10.3 10*3/uL (ref 4.0–10.5)

## 2012-08-16 LAB — COMPREHENSIVE METABOLIC PANEL
ALT: 16 U/L (ref 0–53)
AST: 14 U/L (ref 0–37)
Albumin: 3.5 g/dL (ref 3.5–5.2)
Albumin: 3.2 g/dL — ABNORMAL LOW (ref 3.5–5.2)
Alkaline Phosphatase: 45 U/L (ref 39–117)
BUN: 17 mg/dL (ref 6–23)
BUN: 18 mg/dL (ref 6–23)
CO2: 25 mEq/L (ref 19–32)
Calcium: 9.6 mg/dL (ref 8.4–10.5)
Chloride: 101 mEq/L (ref 96–112)
Creatinine, Ser: 0.94 mg/dL (ref 0.50–1.35)
Creatinine, Ser: 0.88 mg/dL (ref 0.50–1.35)
GFR calc Af Amer: >90 mL/min (ref >90)
GFR calc non Af Amer: 82 mL/min — ABNORMAL LOW (ref >90)
Glucose, Bld: 157 mg/dL — ABNORMAL HIGH (ref 70–99)
Potassium: 4.3 mEq/L (ref 3.5–5.1)
Sodium: 139 mEq/L (ref 135–145)
Total Bilirubin: 0.1 mg/dL — ABNORMAL LOW (ref 0.3–1.2)
Total Protein: 7.4 g/dL (ref 6.0–8.3)
Total Protein: 6.4 g/dL (ref 6.0–8.3)

## 2012-08-16 LAB — APTT: aPTT: 28 seconds (ref 24–37)

## 2012-08-16 LAB — TYPE AND SCREEN
ABO/RH(D): A POS
Antibody Screen: NEGATIVE

## 2012-08-16 LAB — BLOOD GAS, ARTERIAL
Acid-Base Excess: 1.7 mmol/L (ref 0.0–2.0)
Bicarbonate: 26 mEq/L — ABNORMAL HIGH (ref 20.0–24.0)
Drawn by: 24485
O2 Content: 3 L/min
O2 Saturation: 96.2 %
Patient temperature: 98.6
TCO2: 27.2 mmol/L (ref 0–100)
pCO2 arterial: 41.9 mmHg (ref 35.0–45.0)
pH, Arterial: 7.408 (ref 7.350–7.450)
pO2, Arterial: 86.4 mmHg (ref 80.0–100.0)

## 2012-08-16 LAB — URINALYSIS, MICROSCOPIC ONLY
Bilirubin Urine: NEGATIVE
Glucose, UA: NEGATIVE mg/dL
Protein, ur: NEGATIVE mg/dL

## 2012-08-16 LAB — PULMONARY FUNCTION TEST

## 2012-08-16 LAB — PROTIME-INR
INR: 0.95 (ref 0.00–1.49)
Prothrombin Time: 12.6 seconds (ref 11.6–15.2)

## 2012-08-16 MED ORDER — DEXTROSE 5 % IV SOLN
1.5000 g | INTRAVENOUS | Status: AC
  Administered 2012-08-17: 1.5 g via INTRAVENOUS
  Filled 2012-08-16 (×4): qty 1.5

## 2012-08-16 MED ORDER — IOHEXOL 300 MG/ML  SOLN
80.0000 mL | Freq: Once | INTRAMUSCULAR | Status: AC | PRN
  Administered 2012-08-16: 80 mL via INTRAVENOUS

## 2012-08-16 MED ORDER — MIDAZOLAM HCL 2 MG/2ML IJ SOLN
2.0000 mg | Freq: Once | INTRAMUSCULAR | Status: AC
  Administered 2012-08-16: 2 mg via INTRAVENOUS

## 2012-08-16 MED ORDER — LACTATED RINGERS IV SOLN
INTRAVENOUS | Status: DC
  Administered 2012-08-16: 20:00:00 via INTRAVENOUS

## 2012-08-16 MED ORDER — MIDAZOLAM HCL 2 MG/2ML IJ SOLN
INTRAMUSCULAR | Status: AC
  Administered 2012-08-16: 2 mg
  Filled 2012-08-16: qty 2

## 2012-08-16 MED ORDER — MORPHINE SULFATE 4 MG/ML IJ SOLN
INTRAMUSCULAR | Status: AC
  Administered 2012-08-16: 19:00:00
  Filled 2012-08-16: qty 1

## 2012-08-16 MED ORDER — HYDROCORTISONE SOD SUCCINATE 100 MG IJ SOLR
100.0000 mg | Freq: Every day | INTRAMUSCULAR | Status: AC
  Administered 2012-08-17: 100 mg via INTRAVENOUS
  Filled 2012-08-16 (×2): qty 2

## 2012-08-16 MED ORDER — FENTANYL CITRATE 0.05 MG/ML IJ SOLN: 50.0000 ug | INTRAMUSCULAR | Status: DC | PRN

## 2012-08-16 MED ORDER — MORPHINE SULFATE 4 MG/ML IJ SOLN: 4.0000 mg | INTRAMUSCULAR | Status: DC | PRN

## 2012-08-16 NOTE — Progress Notes (Signed)
TCTS  CT chest shows blebs in R lung and tension pntx from prob nonfunctional chest catheter  Will move to SICU and place conventional tube before surgery in am  Plan d/w patient and family

## 2012-08-16 NOTE — Progress Notes (Signed)
Dr. Donata Clay notified of CT results. Will come to floor to see patient. Mamie Levers

## 2012-08-16 NOTE — Op Note (Signed)
Under local anesthesia and sterile prep and drape a 28 F chest tube was placed for tension R PNTX and nonfunctional chest pigtail catheter. CXR pending

## 2012-08-16 NOTE — Consult Note (Signed)
[address]            [place] [address]          [phone]       Mark [name] [facility] Medical Record #[id number] Date of Birth: Dec 12, 1940  Referring: No ref. provider found Primary Care: [name], MD  Chief Complaint:    Chief Complaint  Patient presents with  . Shortness of Breath   spontaneous right pneumothorax, first occurrence  History of Present Illness:     71 year old male with end-stage COPD on home oxygen presented with acute shortness of breath was found have a large right pneumothorax. His mid by the coronary service and a percutaneous catheter was placed with fairly good reexpansion of the lung. However the catheter became dislocated and with recurrent pneumothorax and a catheter was reinserted. Now the patient has incomplete reexpansion of the lung with a pneumothorax residual of least 20%. Due to his severe underlying COPD-emphysema is felt that the patient would benefit from total reexpansion of the lung any thoracic surgical evaluation was requested for VATS and stapling of blebs or spontaneous pneumothorax.   Current Activity/ Functional Status: Activity extremely limited by COPD lives at home with family   Past Medical History  Diagnosis Date  . Atrial fibrillation   . Hyperlipidemia   . Weakness   . Dyspnea   . Headache   . Osteopenia   . COPD (chronic obstructive pulmonary disease)   . Allergic rhinitis     Past Surgical History  Procedure Date  . Catheter ablation   . Cataract extraction, bilateral   . Appendectomy   . Inguinal hernia repair     History  Smoking status  . Former Smoker -- 3.0 packs/day  . Types: Cigarettes  Smokeless tobacco  . Former Neurosurgeon  . Quit date: 09/01/1965    History  Alcohol Use No    History   Social History  . Marital Status: Married    Spouse Name: N/A    Number of Children: N/A  . Years of Education: N/A   Occupational History  . Retired       Air traffic controller   Social History Main Topics  . Smoking status: Former Smoker -- 3.0 packs/day    Types: Cigarettes  . Smokeless tobacco: Former Neurosurgeon    Quit date: 09/01/1965  . Alcohol Use: No  . Drug Use: No  . Sexually Active: Not on file   Other Topics Concern  . Not on file   Social History Narrative  . No narrative on file    Allergies  Allergen Reactions  . Shrimp (Shellfish Allergy)   . Zolpidem Tartrate     Current Facility-Administered Medications  Medication Dose Route Frequency Provider Last Rate Last Dose  . albuterol (PROVENTIL) (5 MG/ML) 0.5% nebulizer solution 2.5 mg  2.5 mg Nebulization Q3H PRN Jeanella Craze, NP   2.5 mg at 08/16/12 1435  . aspirin EC tablet 81 mg  81 mg Oral Daily Merwyn Katos, MD   81 mg at 08/16/12 1004  . budesonide-formoterol (SYMBICORT) 160-4.5 MCG/ACT inhaler 2 puff  2 puff Inhalation BID Jeanella Craze, NP   2 puff at 08/16/12 1004  . cefUROXime (ZINACEF) 1.5 g in dextrose 5 % 50 mL IVPB  1.5 g Intravenous 60 min Pre-Op [name], MD      .  diphenhydrAMINE (BENADRYL) capsule 25-50 mg  25-50 mg Oral QHS PRN Lonia Farber, MD   25 mg at 08/15/12 2203  . docusate sodium (COLACE) capsule 100 mg  100 mg Oral BID Merwyn Katos, MD   100 mg at 08/16/12 1004  . doxycycline (VIBRA-TABS) tablet 100 mg  100 mg Oral Q12H Jeanella Craze, NP   100 mg at 08/16/12 1004  . heparin injection 5,000 Units  5,000 Units Subcutaneous Q8H Coralyn Helling, MD   5,000 Units at 08/16/12 0525  . magnesium hydroxide (MILK OF MAGNESIA) suspension 30 mL  30 mL Oral Q6H PRN Merwyn Katos, MD   30 mL at 08/11/12 1025  . oxyCODONE-acetaminophen (PERCOCET/ROXICET) 5-325 MG per tablet 1-2 tablet  1-2 tablet Oral Q6H PRN Merwyn Katos, MD   2 tablet at 08/16/12 0744  . predniSONE (DELTASONE) tablet 20 mg  20 mg Oral Q breakfast Jeanella Craze, NP   20 mg at 08/16/12 0739  . propafenone (RYTHMOL) tablet 225 mg  225 mg Oral BID  Jeanella Craze, NP   225 mg at 08/16/12 1004  . simethicone (MYLICON) chewable tablet 80 mg  80 mg Oral Q6H PRN Merwyn Katos, MD   80 mg at 08/16/12 0744  . verapamil (CALAN-SR) CR tablet 240 mg  240 mg Oral QHS Jeanella Craze, NP   240 mg at 08/15/12 2202    Prescriptions prior to admission  Medication Sig Dispense Refill  . acetaminophen (TYLENOL) 325 MG tablet Take 650 mg by mouth every 6 (six) hours as needed. For pain      . albuterol (PROAIR HFA) 108 (90 BASE) MCG/ACT inhaler Inhale 2 puffs into the lungs every 4 (four) hours as needed. For shortness of breath/wheezing      . albuterol (PROVENTIL) (2.5 MG/3ML) 0.083% nebulizer solution Take 3 mLs (2.5 mg total) by nebulization every 6 (six) hours as needed for wheezing.  75 mL  12  . aspirin EC 81 MG tablet Take 1 tablet (81 mg total) by mouth daily.  150 tablet  2  . budesonide-formoterol (SYMBICORT) 160-4.5 MCG/ACT inhaler Inhale 2 puffs into the lungs 2 (two) times daily. Rinse mouth  3 Inhaler  3  . Calcium-Magnesium-Vitamin D (ONE-A-DAY CALCIUM PLUS) 500-50-100 MG-MG-UNIT CHEW Chew 1 tablet by mouth 2 (two) times daily.        Marland Kitchen doxycycline (VIBRA-TABS) 100 MG tablet Take 100-200 mg by mouth daily. Take 2 today, then one daily until gone; Start date 08/10/12, Duration unknown      . EPINEPHrine (EPI-PEN) 0.3 mg/0.3 mL DEVI Inject 0.3 mg into the skin daily as needed. For anaphylaxis      . guaiFENesin (MUCINEX) 600 MG 12 hr tablet Take 1-2 mg by mouth every 12 (twelve) hours as needed. For cough      . ipratropium-albuterol (DUONEB) 0.5-2.5 (3) MG/3ML SOLN Take 3 mLs by nebulization 4 (four) times daily as needed.  360 mL  11  . magnesium oxide (MAG-OX) 400 MG tablet Take 1 tablet (400 mg total) by mouth daily.      . Multiple Vitamins-Minerals (CENTRUM SILVER PO) Take 1 tablet by mouth daily.        . predniSONE (DELTASONE) 20 MG tablet Take 20 mg by mouth daily.      . propafenone (RYTHMOL) 225 MG tablet Take 225 mg by mouth 2  (two) times daily.      . sodium chloride (OCEAN) 0.65 % nasal spray 1 spray by Nasal  route 2 (two) times daily.        . verapamil (CALAN-SR) 240 MG CR tablet Take 1 tablet (240 mg total) by mouth at bedtime.  30 tablet  9    Family History  Problem Relation Age of Onset  . Heart attack Father   . COPD Brother     deceased  . COPD Sister   . COPD Father   . Lung cancer Father      Review of Systems:     Cardiac Review of Systems: Y or N  Chest Pain Milo.Brash    ]  Resting SOB [ y  ] Exertional SOB  Cove.Etienne  ]  Orthopnea Cove.Etienne  ]   Pedal Edema [ n  ]    Palpitations Milo.Brash  ] Syncope  n[  ]   Presyncope [n   ]  General Review of Systems: [Y] = yes [  ]=no Constitional: recent weight change [  ]; anorexia [  ]; fatigue [  ]; nausea [  ]; night sweats [  ]; fever [  ]; or chills [  ];                                                                                                                                          Dental: poor dentition[  ]; Last Dentist visi>36yr  Eye : blurred vision [  ]; diplopia [   ]; vision changes [  ];  Amaurosis fugax[  ]; Resp: cough [  y];  wheezing[y  ];  hemoptysis[  ]; shortness of breath[  ]; paroxysmal nocturnal dyspnea[  ]; dyspnea on exertion[  ]; or orthopnea[  ];  GI:  gallstones[  ], vomiting[  ];  dysphagia[  ]; melena[  ];  hematochezia [  ]; heartburn[  ];   Hx of  Colonoscopy[  ]; GU: kidney stones [  ]; hematuria[  ];   dysuria [  ];  nocturia[  ];  history of     obstruction [  ];             Skin: rash, swelling[  ];, hair loss[  ];  peripheral edema[  ];  or itching[  ]; Musculosketetal: myalgias[  ];  joint swelling[  ];  joint erythema[  ];  joint pain[  ];  back pain[  ];  Heme/Lymph: bruising[  ];  bleeding[  ];  anemia[  ];  Neuro: TIA[  ];  headaches[  ];  stroke[  ];  vertigo[  ];  seizures[  ];   paresthesias[  ];  difficulty walking[  ];  Psych:depression[  ]; anxiety[  ];  Endocrine: diabetes[  ];  thyroid dysfunction[  ];  Immunizations: Flu  [  ]; Pneumococcal[  ];  Other:                  Last 2-D  echo shows good LV function, 2011 Physical Exam: BP 133/70  Pulse 115  Temp 97.8 F (36.6 C) (Oral)  Resp 20  Ht 5\' 11"  (1.803 m)  Wt 166 lb 7.2 oz (75.5 kg)  BMI 23.21 kg/m2  SpO2 93% Physical exam Chronically ill elderly male on oxygen with obvious COPD HEENT normocephalic Neck without adenopathy or JVD or subcutaneous air Chest right pleural catheter in place no obvious air leak scattered rhonchi Cardiac regular rhythm without murmur Abdomen soft Extremities mild edema nontender Neuro intact    Diagnostic Studies & Laboratory data:   Chest x-ray shows 20-25% residual right pneumothorax with catheter in place  Recent Radiology Findings:   Ct Chest W Contrast  08/16/2012  *RADIOLOGY REPORT*  Clinical Data: Cough  CT CHEST WITH CONTRAST  Technique:  Multidetector CT imaging of the chest was performed following the standard protocol during bolus administration of intravenous contrast.  Contrast: 80mL OMNIPAQUE IOHEXOL 300 MG/ML  SOLN  Comparison: 09/12/2003  Findings: Large right tension pneumothorax.  Chest tube remains in place in the anterior lower right hemithorax.  There is shift of the mediastinum to the left.  The lung is severely diseased with severe centrilobular emphysema.  Subcutaneous emphysema is seen over the right chest wall  Negative abnormal mediastinal adenopathy. Small nodes are present.  Small right pleural effusion.  No destructive bone lesion.  IMPRESSION: Despite right chest tube, tension pneumothorax has developed.  Severe centrilobular emphysema. Critical Value/emergent results were called by telephone at the time of interpretation on 08/16/2012 at 1420 hours to Hodge, [place], who verbally acknowledged these results.   Original Report Authenticated By: Jolaine Click, M.D.    Dg Chest Port 1 View  08/16/2012  *RADIOLOGY REPORT*  Clinical Data: Follow up pneumothorax  PORTABLE CHEST - 1 VIEW  Comparison:  Portable chest x-ray of 08/15/2012  Findings: The volume of the right pneumothorax has increased somewhat the tip of the basilar component. The right chest tube remains.  The lungs remain hyperaerated.  Heart size is stable.  IMPRESSION: Some increase in volume of the right pneumothorax with a more prominent basilar component.   Original Report Authenticated By: Dwyane Dee, M.D.    Dg Chest Port 1 View  08/15/2012  *RADIOLOGY REPORT*  Clinical Data: Post chest tube placement.  PORTABLE CHEST - 1 VIEW  Comparison: 08/15/2012  Findings: A pigtail chest tube has been placed on the right side. There is re-expansion of the right lung.  There may be residual pleural air along the apex.  Stable appearance of the heart and mediastinum.  No evidence for mediastinal shift.  IMPRESSION: Placement of a right sided chest tube.  There is re-expansion of the right lung. There may be residual pleural air along the right lung apex.   Original Report Authenticated By: Richarda Overlie, M.D.    Dg Chest Port 1 View  08/15/2012  *RADIOLOGY REPORT*  Clinical Data: Pneumothorax  PORTABLE CHEST - 1 VIEW  Comparison: 08/14/2012  Findings: Large right pneumothorax is similar in size to prior. Otherwise no interval change. No mediastinal shift.  Subcutaneous emphysema along the periphery the right hemithorax.  IMPRESSION: Unchanged right pneumothorax.   Original Report Authenticated By: Jearld Lesch, M.D.       Recent Lab Findings: Lab Results  Component Value Date   WBC 13.5* 08/16/2012   HGB 14.9 08/16/2012   HCT 45.6 08/16/2012   PLT 251 08/16/2012   GLUCOSE 157* 08/16/2012   ALT 16 08/16/2012   AST 14  08/16/2012   NA 139 08/16/2012   K 4.3 08/16/2012   CL 101 08/16/2012   CREATININE 0.94 08/16/2012   BUN 17 08/16/2012   CO2 25 08/16/2012   TSH 0.88 09/02/2011   INR 0.95 08/16/2012      Assessment / Plan:      Large spontaneous right pneumothorax due to COPD with incomplete reexpansion along with a small  percutaneous catheter. Patient has limited pulmonary reserve and would benefit from complete lung reexpansion. We'll plan limited VATS with bleb stapling and pleurodesed is in a.m. Patient understands he is at increased risk for prolonged ventilator dependence postop day 2 his severe COPD. Patient will need steroid pulse therapy preoperative      @me1 @ 08/16/2012 2:46 PM

## 2012-08-16 NOTE — Progress Notes (Signed)
PULMONARY  / CRITICAL CARE MEDICINE  Name: Mark [name] MRN: [id number] DOB: 02-09-1941    LOS: 6  REFERRING PROVIDER:  [name]   CHIEF COMPLAINT:  Pneumothorax  BRIEF PATIENT DESCRIPTION: 43 yowm former smoker,  with PMH of steroid / O2 dependent (20mg ) COPD, Chronic Afib who was seen in Pulmonary Office 12/17 for 3 day history of worsening shortness of breath, decreased O2 levels, decreased energy.  Found to have moderate R pneumothorax.  CT placed and admitted for further care.    LINES / TUBES: R CT  12/17 >12/21 Re  CT  12/22>>>  CULTURES: MRSA screen 12/18 > negative  ANTIBIOTICS: 12/16 Doxycycline (AECOPD) > 12/23   SIGNIFICANT EVENTS:  12/16 - started on doxy for worsening SOB, decreased sats 12/17 - Acute office visit for above, found to have R basilar 20% PTX 12/19 - residual small apical ptx, no airleak 12/20 - CT on water seal??  Patient took off suction to go to bathroom, lung with tiny residual ptx 12/21 - CT removed 12/22 - repeat 40% ptx, ct replaced  LEVEL OF CARE:  Telemetery PRIMARY SERVICE:  PCCM CONSULTANTS: None CODE STATUS:  Full DIET:  Modified carbohydrate DVT Px:  SQ heparin GI Px:  None indicated    INTERVAL HISTORY:  More short of breath today.  Denies pain.    VITAL SIGNS: Temp:  [97.5 F (36.4 C)-97.8 F (36.6 C)] 97.6 F (36.4 C) (12/23 0500) Pulse Rate:  [70-94] 70  (12/23 0500) Resp:  [16-20] 16  (12/23 0500) BP: (133-148)/(78-84) 137/78 mmHg (12/23 0500) SpO2:  [94 %-98 %] 96 % (12/23 0754) FIO2: NP  3lpm  PHYSICAL EXAMINATION: General:  Elderly male, in NAD Neuro:  Alert, oriented, moves all 4.  HEENT:  WNL Cardiovascular:  RRR  Lungs:  No wheezes, diminished breath sounds throughout.  Pursed lip breathing Abdomen:  NT, soft / active bs Ext: No edema, no cyanosis    Lab 08/11/12 0345 08/10/12 1730  NA 139 138  K 3.9 4.4  CL 100 98  CO2 29 25  BUN 17 14  CREATININE 0.81 0.75  GLUCOSE 101* 116*    Lab  08/11/12 0345 08/10/12 1730  HGB 12.4* 13.7  HCT 39.0 43.4  WBC 12.8* 13.6*  PLT 263 301   CXR:  12/23 >> Some increase in volume of the right pneumothorax with a more prominent basilar component.    IMPRESSION:  Spontaneous pneumothorax The pt has had recurrence with chest tube removal, and I suspect this indicates he may need vats for resolution. He now has had replacement of his ct with improved cxr and symtoms.    COPD with chronic bronchitis-oxygen dependent - completed  doxycycline for AECOPD 12/23.  Chronic pred @ 20 mg/day.  Currently without active bronchospasm   PLAN: - continue chest tube with suction - check cxr in am - no change in copd meds - CVTS consult for potential VATS 12/13  - d/c doxy 12/23 - PRN nebs   Sandrea Hughs, MD Pulmonary and Critical Care Medicine [facility] [phone]

## 2012-08-16 NOTE — Progress Notes (Signed)
Report called to 2300 for patient transfer. Consent obtained for chest tube insertion. Patient transported via wheelchair with belongings, O2 @ 3L and chest tube. Family and belongings with patient. Mamie Levers

## 2012-08-16 NOTE — Progress Notes (Signed)
TCTS BRIEF SICU PROGRESS NOTE    Doing much better following replacement of chest tube CXR w/ reexpansion of lung  Plan: For VATS tomorrow  Purcell Nails 08/16/2012 9:05 PM

## 2012-08-17 ENCOUNTER — Encounter (HOSPITAL_COMMUNITY): Payer: Self-pay | Admitting: Anesthesiology

## 2012-08-17 ENCOUNTER — Encounter (HOSPITAL_COMMUNITY): Payer: Self-pay | Admitting: Pulmonary Disease

## 2012-08-17 ENCOUNTER — Encounter (HOSPITAL_COMMUNITY)
Admission: EM | Disposition: A | Discharge status: Home Health | Payer: Self-pay | Source: Home / Self Care | Attending: Pulmonary Disease

## 2012-08-17 ENCOUNTER — Inpatient Hospital Stay (HOSPITAL_COMMUNITY): Payer: Medicare Other | Admitting: Anesthesiology

## 2012-08-17 ENCOUNTER — Inpatient Hospital Stay (HOSPITAL_COMMUNITY): Payer: Medicare Other

## 2012-08-17 DIAGNOSIS — J9600 Acute respiratory failure, unspecified whether with hypoxia or hypercapnia: Secondary | ICD-10-CM | POA: Insufficient documentation

## 2012-08-17 DIAGNOSIS — R0602 Shortness of breath: Secondary | ICD-10-CM

## 2012-08-17 DIAGNOSIS — J9620 Acute and chronic respiratory failure, unspecified whether with hypoxia or hypercapnia: Secondary | ICD-10-CM

## 2012-08-17 DIAGNOSIS — J930 Spontaneous tension pneumothorax: Secondary | ICD-10-CM

## 2012-08-17 HISTORY — PX: VIDEO ASSISTED THORACOSCOPY (VATS)/THOROCOTOMY: SHX6173

## 2012-08-17 HISTORY — PX: VIDEO ASSISTED THORACOSCOPY: SHX5073

## 2012-08-17 HISTORY — PX: RESECTION OF APICAL BLEB: SHX5078

## 2012-08-17 LAB — POCT I-STAT 3, ART BLOOD GAS (G3+)
Acid-Base Excess: 2 mmol/L (ref 0.0–2.0)
Bicarbonate: 29.9 mEq/L — ABNORMAL HIGH (ref 20.0–24.0)
O2 Saturation: 98 %
TCO2: 31 mmol/L (ref 0–100)
TCO2: 28 mmol/L (ref 0–100)
pH, Arterial: 7.448 (ref 7.350–7.450)
pO2, Arterial: 112 mmHg — ABNORMAL HIGH (ref 80.0–100.0)

## 2012-08-17 LAB — GLUCOSE, CAPILLARY
Glucose-Capillary: 106 mg/dL — ABNORMAL HIGH (ref 70–99)
Glucose-Capillary: 94 mg/dL (ref 70–99)

## 2012-08-17 SURGERY — VIDEO ASSISTED THORACOSCOPY
Anesthesia: General | Site: Chest | Laterality: Right | Wound class: Clean Contaminated

## 2012-08-17 MED ORDER — DEXMEDETOMIDINE HCL IN NACL 400 MCG/100ML IV SOLN
INTRAVENOUS | Status: DC | PRN
  Administered 2012-08-17: 0.2 ug/kg/h via INTRAVENOUS

## 2012-08-17 MED ORDER — HYDROMORPHONE HCL PF 1 MG/ML IJ SOLN: 0.2500 mg | INTRAMUSCULAR | Status: DC | PRN

## 2012-08-17 MED ORDER — OXYCODONE HCL 5 MG PO TABS: 5.0000 mg | ORAL_TABLET | Freq: Once | ORAL | Status: DC | PRN

## 2012-08-17 MED ORDER — FENTANYL CITRATE 0.05 MG/ML IJ SOLN: 25.0000 ug | INTRAMUSCULAR | Status: DC | PRN

## 2012-08-17 MED ORDER — IPRATROPIUM BROMIDE 0.02 % IN SOLN
0.5000 mg | Freq: Four times a day (QID) | RESPIRATORY_TRACT | Status: DC
  Administered 2012-08-17 – 2012-08-20 (×12): 0.5 mg via RESPIRATORY_TRACT
  Filled 2012-08-17 (×12): qty 2.5

## 2012-08-17 MED ORDER — ACETAMINOPHEN 10 MG/ML IV SOLN
INTRAVENOUS | Status: AC
  Filled 2012-08-17: qty 100

## 2012-08-17 MED ORDER — FENTANYL CITRATE 0.05 MG/ML IJ SOLN
INTRAMUSCULAR | Status: DC | PRN
  Administered 2012-08-17: 100 ug via INTRAVENOUS
  Administered 2012-08-17: 150 ug via INTRAVENOUS
  Administered 2012-08-17: 50 ug via INTRAVENOUS
  Administered 2012-08-17 (×3): 100 ug via INTRAVENOUS

## 2012-08-17 MED ORDER — LACTATED RINGERS IV SOLN
INTRAVENOUS | Status: DC | PRN
  Administered 2012-08-17 (×2): via INTRAVENOUS

## 2012-08-17 MED ORDER — BUPIVACAINE 0.5 % ON-Q PUMP SINGLE CATH 400 ML
400.0000 mL | INJECTION | Status: DC
  Filled 2012-08-17: qty 400

## 2012-08-17 MED ORDER — BUPIVACAINE 0.5 % ON-Q PUMP SINGLE CATH 400 ML
INJECTION | Status: DC | PRN
  Administered 2012-08-17: 400 mL

## 2012-08-17 MED ORDER — HEMOSTATIC AGENTS (NO CHARGE) OPTIME
TOPICAL | Status: DC | PRN
  Administered 2012-08-17: 1 via TOPICAL

## 2012-08-17 MED ORDER — ONDANSETRON HCL 4 MG/2ML IJ SOLN: 4.0000 mg | Freq: Four times a day (QID) | INTRAMUSCULAR | Status: DC | PRN

## 2012-08-17 MED ORDER — HYDROCORTISONE SOD SUCCINATE 100 MG IJ SOLR
100.0000 mg | Freq: Three times a day (TID) | INTRAMUSCULAR | Status: AC
  Administered 2012-08-17 (×2): 100 mg via INTRAVENOUS
  Filled 2012-08-17 (×2): qty 2

## 2012-08-17 MED ORDER — PHENYLEPHRINE HCL 10 MG/ML IJ SOLN
INTRAMUSCULAR | Status: DC | PRN
  Administered 2012-08-17 (×3): 80 ug via INTRAVENOUS
  Administered 2012-08-17: 40 ug via INTRAVENOUS
  Administered 2012-08-17 (×2): 80 ug via INTRAVENOUS
  Administered 2012-08-17: 40 ug via INTRAVENOUS

## 2012-08-17 MED ORDER — POTASSIUM CHLORIDE 10 MEQ/50ML IV SOLN
10.0000 meq | Freq: Every day | INTRAVENOUS | Status: DC | PRN
  Administered 2012-08-20: 10 meq via INTRAVENOUS
  Filled 2012-08-17: qty 150

## 2012-08-17 MED ORDER — OXYCODONE HCL 5 MG PO TABS: 5.0000 mg | ORAL_TABLET | ORAL | Status: DC | PRN

## 2012-08-17 MED ORDER — ASPIRIN EC 81 MG PO TBEC
81.0000 mg | DELAYED_RELEASE_TABLET | Freq: Every day | ORAL | Status: DC
  Administered 2012-08-18 – 2012-08-31 (×14): 81 mg via ORAL
  Filled 2012-08-17 (×14): qty 1

## 2012-08-17 MED ORDER — PANTOPRAZOLE SODIUM 40 MG IV SOLR
40.0000 mg | INTRAVENOUS | Status: DC
  Administered 2012-08-17 – 2012-08-19 (×3): 40 mg via INTRAVENOUS
  Filled 2012-08-17 (×5): qty 40

## 2012-08-17 MED ORDER — PREDNISONE 20 MG PO TABS
20.0000 mg | ORAL_TABLET | Freq: Every day | ORAL | Status: DC
  Filled 2012-08-17: qty 1

## 2012-08-17 MED ORDER — MIDAZOLAM HCL 2 MG/2ML IJ SOLN
1.0000 mg | INTRAMUSCULAR | Status: DC | PRN
  Administered 2012-08-17 – 2012-08-18 (×6): 2 mg via INTRAVENOUS
  Filled 2012-08-17 (×6): qty 2

## 2012-08-17 MED ORDER — POTASSIUM CHLORIDE IN NACL 20-0.9 MEQ/L-% IV SOLN
INTRAVENOUS | Status: DC
  Administered 2012-08-17: 14:00:00 via INTRAVENOUS
  Administered 2012-08-18: 100 mL via INTRAVENOUS
  Administered 2012-08-19: 08:00:00 via INTRAVENOUS
  Administered 2012-08-19: 100 mL via INTRAVENOUS
  Administered 2012-08-19: 50 mL via INTRAVENOUS
  Administered 2012-08-21: 16:00:00 via INTRAVENOUS
  Filled 2012-08-17 (×12): qty 1000

## 2012-08-17 MED ORDER — ACETAMINOPHEN 10 MG/ML IV SOLN
INTRAVENOUS | Status: DC | PRN
  Administered 2012-08-17: 1000 mg via INTRAVENOUS

## 2012-08-17 MED ORDER — OXYCODONE HCL 5 MG/5ML PO SOLN: 5.0000 mg | Freq: Once | ORAL | Status: DC | PRN

## 2012-08-17 MED ORDER — BISACODYL 5 MG PO TBEC
10.0000 mg | DELAYED_RELEASE_TABLET | Freq: Every day | ORAL | Status: DC
  Administered 2012-08-17 – 2012-08-30 (×11): 10 mg via ORAL
  Filled 2012-08-17 (×2): qty 2
  Filled 2012-08-17: qty 1
  Filled 2012-08-17 (×3): qty 2
  Filled 2012-08-17: qty 1
  Filled 2012-08-17: qty 2
  Filled 2012-08-17: qty 1
  Filled 2012-08-17 (×4): qty 2

## 2012-08-17 MED ORDER — DEXTROSE 5 % IV SOLN
1.5000 g | Freq: Two times a day (BID) | INTRAVENOUS | Status: DC
  Administered 2012-08-17 – 2012-08-18 (×3): 1.5 g via INTRAVENOUS
  Filled 2012-08-17 (×6): qty 1.5

## 2012-08-17 MED ORDER — PROPAFENONE HCL 225 MG PO TABS
225.0000 mg | ORAL_TABLET | Freq: Two times a day (BID) | ORAL | Status: DC
  Administered 2012-08-18 – 2012-08-31 (×26): 225 mg via ORAL
  Filled 2012-08-17 (×28): qty 1

## 2012-08-17 MED ORDER — PROPOFOL 10 MG/ML IV BOLUS
INTRAVENOUS | Status: DC | PRN
  Administered 2012-08-17: 100 mg via INTRAVENOUS

## 2012-08-17 MED ORDER — TRAMADOL HCL 50 MG PO TABS
50.0000 mg | ORAL_TABLET | Freq: Four times a day (QID) | ORAL | Status: DC | PRN
  Administered 2012-08-18 – 2012-08-27 (×23): 100 mg via ORAL
  Administered 2012-08-27: 50 mg via ORAL
  Administered 2012-08-28 – 2012-08-31 (×6): 100 mg via ORAL
  Administered 2012-08-31 (×2): 50 mg via ORAL
  Filled 2012-08-17 (×15): qty 2
  Filled 2012-08-17: qty 1
  Filled 2012-08-17 (×7): qty 2
  Filled 2012-08-17: qty 1
  Filled 2012-08-17 (×9): qty 2

## 2012-08-17 MED ORDER — DEXMEDETOMIDINE HCL IN NACL 200 MCG/50ML IV SOLN
0.4000 ug/kg/h | INTRAVENOUS | Status: DC
  Filled 2012-08-17 (×2): qty 50

## 2012-08-17 MED ORDER — OXYCODONE-ACETAMINOPHEN 5-325 MG PO TABS: 1.0000 | ORAL_TABLET | ORAL | Status: DC | PRN

## 2012-08-17 MED ORDER — LEVALBUTEROL HCL 0.63 MG/3ML IN NEBU
0.6300 mg | INHALATION_SOLUTION | Freq: Four times a day (QID) | RESPIRATORY_TRACT | Status: DC
  Administered 2012-08-17 – 2012-08-19 (×10): 0.63 mg via RESPIRATORY_TRACT
  Filled 2012-08-17 (×12): qty 3

## 2012-08-17 MED ORDER — ROCURONIUM BROMIDE 100 MG/10ML IV SOLN
INTRAVENOUS | Status: DC | PRN
  Administered 2012-08-17: 10 mg via INTRAVENOUS
  Administered 2012-08-17: 50 mg via INTRAVENOUS
  Administered 2012-08-17: 10 mg via INTRAVENOUS
  Administered 2012-08-17: 20 mg via INTRAVENOUS
  Administered 2012-08-17: 50 mg via INTRAVENOUS

## 2012-08-17 MED ORDER — ONDANSETRON HCL 4 MG/2ML IJ SOLN
INTRAMUSCULAR | Status: DC | PRN
  Administered 2012-08-17: 4 mg via INTRAVENOUS

## 2012-08-17 MED ORDER — SENNOSIDES-DOCUSATE SODIUM 8.6-50 MG PO TABS
1.0000 | ORAL_TABLET | Freq: Every evening | ORAL | Status: DC | PRN
  Filled 2012-08-17: qty 1

## 2012-08-17 MED ORDER — ACETAMINOPHEN 10 MG/ML IV SOLN
1000.0000 mg | Freq: Four times a day (QID) | INTRAVENOUS | Status: AC
  Administered 2012-08-17 – 2012-08-18 (×3): 1000 mg via INTRAVENOUS
  Filled 2012-08-17 (×5): qty 100

## 2012-08-17 MED ORDER — INSULIN ASPART 100 UNIT/ML ~~LOC~~ SOLN
0.0000 [IU] | SUBCUTANEOUS | Status: DC
  Administered 2012-08-18: 2 [IU] via SUBCUTANEOUS

## 2012-08-17 MED ORDER — LACTATED RINGERS IV SOLN
INTRAVENOUS | Status: DC | PRN
  Administered 2012-08-17 (×2): via INTRAVENOUS

## 2012-08-17 MED ORDER — 0.9 % SODIUM CHLORIDE (POUR BTL) OPTIME
TOPICAL | Status: DC | PRN
  Administered 2012-08-17: 1000 mL

## 2012-08-17 MED ORDER — MIDAZOLAM HCL 5 MG/ML IJ SOLN
INTRAMUSCULAR | Status: DC | PRN
  Administered 2012-08-17: 2 mg via INTRAVENOUS

## 2012-08-17 MED ORDER — BUPIVACAINE ON-Q PAIN PUMP (FOR ORDER SET NO CHG)
INJECTION | Status: AC
  Filled 2012-08-17: qty 1

## 2012-08-17 MED ORDER — SODIUM CHLORIDE 0.9 % IV SOLN
25.0000 ug/h | INTRAVENOUS | Status: DC
  Administered 2012-08-17: 50 ug/h via INTRAVENOUS
  Filled 2012-08-17 (×2): qty 50

## 2012-08-17 MED ORDER — FENTANYL BOLUS VIA INFUSION
25.0000 ug | Freq: Four times a day (QID) | INTRAVENOUS | Status: DC | PRN
  Filled 2012-08-17: qty 100

## 2012-08-17 MED ORDER — EPHEDRINE SULFATE 50 MG/ML IJ SOLN
INTRAMUSCULAR | Status: DC | PRN
  Administered 2012-08-17: 10 mg via INTRAVENOUS

## 2012-08-17 SURGICAL SUPPLY — 79 items
BAG DECANTER FOR FLEXI CONT (MISCELLANEOUS) IMPLANT
BLADE SURG 11 STRL SS (BLADE) ×3 IMPLANT
BLADE SURG ROTATE 9660 (MISCELLANEOUS) ×2 IMPLANT
CANISTER SUCTION 2500CC (MISCELLANEOUS) ×5 IMPLANT
CATH KIT ON Q 5IN SLV (PAIN MANAGEMENT) ×2 IMPLANT
CATH ROBINSON RED A/P 22FR (CATHETERS) IMPLANT
CATH THORACIC 28FR (CATHETERS) IMPLANT
CATH THORACIC 36FR (CATHETERS) ×6 IMPLANT
CATH THORACIC 36FR RT ANG (CATHETERS) IMPLANT
CLEANER TIP ELECTROSURG 2X2 (MISCELLANEOUS) ×2 IMPLANT
CLOTH BEACON ORANGE TIMEOUT ST (SAFETY) ×3 IMPLANT
CONN ST 1/4X3/8  BEN (MISCELLANEOUS) ×2
CONN ST 1/4X3/8 BEN (MISCELLANEOUS) IMPLANT
CONT SPEC 4OZ CLIKSEAL STRL BL (MISCELLANEOUS) ×10 IMPLANT
COVER SURGICAL LIGHT HANDLE (MISCELLANEOUS) ×6 IMPLANT
DRAIN CHANNEL 28F RND 3/8 FF (WOUND CARE) ×2 IMPLANT
DRAPE LAPAROSCOPIC ABDOMINAL (DRAPES) ×3 IMPLANT
DRAPE WARM FLUID 44X44 (DRAPE) ×3 IMPLANT
ELECT BLADE 6.5 EXT (BLADE) ×2 IMPLANT
ELECT REM PT RETURN 9FT ADLT (ELECTROSURGICAL) ×3
ELECTRODE REM PT RTRN 9FT ADLT (ELECTROSURGICAL) ×1 IMPLANT
GAUZE XEROFORM 5X9 LF (GAUZE/BANDAGES/DRESSINGS) ×2 IMPLANT
GLOVE BIO SURGEON STRL SZ 6 (GLOVE) ×2 IMPLANT
GLOVE BIO SURGEON STRL SZ 6.5 (GLOVE) ×1 IMPLANT
GLOVE BIO SURGEON STRL SZ7.5 (GLOVE) ×6 IMPLANT
GLOVE BIO SURGEONS STRL SZ 6.5 (GLOVE) ×1
GLOVE BIOGEL PI IND STRL 6.5 (GLOVE) IMPLANT
GLOVE BIOGEL PI IND STRL 7.0 (GLOVE) IMPLANT
GLOVE BIOGEL PI INDICATOR 6.5 (GLOVE) ×4
GLOVE BIOGEL PI INDICATOR 7.0 (GLOVE) ×4
GOWN STRL NON-REIN LRG LVL3 (GOWN DISPOSABLE) ×13 IMPLANT
HANDLE UNIV ENDO GIA (ENDOMECHANICALS) ×2 IMPLANT
KIT BASIN OR (CUSTOM PROCEDURE TRAY) ×3 IMPLANT
KIT ROOM TURNOVER OR (KITS) ×3 IMPLANT
KIT SUCTION CATH 14FR (SUCTIONS) ×5 IMPLANT
MATRIX HEMOSTAT SURGIFLO (HEMOSTASIS) ×2 IMPLANT
NS IRRIG 1000ML POUR BTL (IV SOLUTION) ×12 IMPLANT
PACK CHEST (CUSTOM PROCEDURE TRAY) ×3 IMPLANT
PAD ARMBOARD 7.5X6 YLW CONV (MISCELLANEOUS) ×6 IMPLANT
RELOAD EGIA 45 MED/THCK PURPLE (STAPLE) ×30 IMPLANT
RELOAD EGIA 60 MED/THCK PURPLE (STAPLE) ×3 IMPLANT
RELOAD STAPLE 60 MED/THCK ART (STAPLE) IMPLANT
SEALANT PROGEL (MISCELLANEOUS) ×12 IMPLANT
SEALANT SURG COSEAL 4ML (VASCULAR PRODUCTS) IMPLANT
SOLUTION ANTI FOG 6CC (MISCELLANEOUS) ×3 IMPLANT
SPONGE GAUZE 4X4 12PLY (GAUZE/BANDAGES/DRESSINGS) ×3 IMPLANT
SPONGE TONSIL 1 RF SGL (DISPOSABLE) ×8 IMPLANT
SPONGE TONSIL 1.25 RF SGL STRG (GAUZE/BANDAGES/DRESSINGS) ×5 IMPLANT
SUT CHROMIC 3 0 SH 27 (SUTURE) ×6 IMPLANT
SUT ETHILON 3 0 FSL (SUTURE) ×10 IMPLANT
SUT ETHILON 3 0 PS 1 (SUTURE) ×2 IMPLANT
SUT PROLENE 3 0 SH DA (SUTURE) IMPLANT
SUT PROLENE 4 0 RB 1 (SUTURE)
SUT PROLENE 4-0 RB1 .5 CRCL 36 (SUTURE) IMPLANT
SUT SILK  1 MH (SUTURE) ×10
SUT SILK 1 MH (SUTURE) ×2 IMPLANT
SUT SILK 2 0SH CR/8 30 (SUTURE) ×2 IMPLANT
SUT SILK 3 0SH CR/8 30 (SUTURE) IMPLANT
SUT VIC AB 1 CTX 18 (SUTURE) ×6 IMPLANT
SUT VIC AB 1 CTX 36 (SUTURE) ×3
SUT VIC AB 1 CTX36XBRD ANBCTR (SUTURE) IMPLANT
SUT VIC AB 2 TP1 27 (SUTURE) IMPLANT
SUT VIC AB 2-0 CT1 27 (SUTURE) ×3
SUT VIC AB 2-0 CT1 TAPERPNT 27 (SUTURE) IMPLANT
SUT VIC AB 2-0 CTX 27 (SUTURE) ×4 IMPLANT
SUT VIC AB 2-0 CTX 36 (SUTURE) IMPLANT
SUT VIC AB 3-0 X1 27 (SUTURE) ×2 IMPLANT
SUT VICRYL 0 UR6 27IN ABS (SUTURE) ×8 IMPLANT
SUT VICRYL 2 TP 1 (SUTURE) ×2 IMPLANT
SWAB COLLECTION DEVICE MRSA (MISCELLANEOUS) IMPLANT
SYSTEM SAHARA CHEST DRAIN ATS (WOUND CARE) ×3 IMPLANT
TAPE CLOTH SURG 4X10 WHT LF (GAUZE/BANDAGES/DRESSINGS) ×2 IMPLANT
TIP APPLICATOR SPRAY EXTEND 16 (VASCULAR PRODUCTS) ×10 IMPLANT
TOWEL OR 17X24 6PK STRL BLUE (TOWEL DISPOSABLE) ×3 IMPLANT
TOWEL OR 17X26 10 PK STRL BLUE (TOWEL DISPOSABLE) ×6 IMPLANT
TRAP SPECIMEN MUCOUS 40CC (MISCELLANEOUS) IMPLANT
TRAY FOLEY CATH 14FR (SET/KITS/TRAYS/PACK) ×3 IMPLANT
TUBE ANAEROBIC SPECIMEN COL (MISCELLANEOUS) IMPLANT
WATER STERILE IRR 1000ML POUR (IV SOLUTION) ×6 IMPLANT

## 2012-08-17 NOTE — Transfer of Care (Signed)
Immediate Anesthesia Transfer of Care Note  Patient: Mark [name]  Procedure(s) Performed: Procedure(s) (LRB) with comments: VIDEO ASSISTED THORACOSCOPY (Right) RESECTION OF APICAL BLEB (Right) - stapling of bleb  Patient Location: SICU  Anesthesia Type:General  Level of Consciousness: sedated and unresponsive  Airway & Oxygen Therapy: Patient remains intubated per anesthesia plan and Patient placed on Ventilator (see vital sign flow sheet for setting)  Post-op Assessment: Report given to PACU RN and Post -op Vital signs reviewed and stable  Post vital signs: Reviewed and stable  Complications: No apparent anesthesia complications

## 2012-08-17 NOTE — Significant Event (Signed)
1315pm-verbal order from MD Donata Clay to leave CT to waterseal. Present of large airleak at admission to SICU. MD aware.

## 2012-08-17 NOTE — Progress Notes (Signed)
PULMONARY  / CRITICAL CARE MEDICINE  Name: DREYTON ROESSNER MRN: [id number] DOB: 1941-05-14    LOS: 7  REFERRING PROVIDER:  [name]   CHIEF COMPLAINT:  Pneumothorax  BRIEF PATIENT DESCRIPTION: 21 yowm former smoker,  with PMH of steroid / O2 dependent (20mg ) COPD, Chronic Afib who was seen in Pulmonary Office 12/17 for 3 day history of worsening shortness of breath, decreased O2 levels, decreased energy.  Found to have moderate R pneumothorax.  CT placed and admitted for further care.    LINES / TUBES: R CT  12/17 >>>12/21 Repeat R CT 12/22>>> OETT 12/24>>> R [place] dual lumen12/24>>>   CULTURES: MRSA screen 12/18 > negative 12/24 VATS Bx>>>   ANTIBIOTICS: 12/16 Doxycycline (AECOPD) > 12/23  12/24 Zinacef  SIGNIFICANT EVENTS:  12/16 - started on doxy for worsening SOB, decreased sats 12/17 - Acute office visit for above, found to have R basilar 20% PTX 12/19 - residual small apical ptx, no airleak 12/20 - CT on water seal??  Patient took off suction to go to bathroom, lung with tiny residual ptx 12/21 - CT removed 12/22 - repeat 40% ptx, ct replaced 12/24 - R  VATS with resection / stapling of blebs, mechanical pleurodesis & CT placement.  Returned to ICU on vent    LEVEL OF CARE:  Telemetery PRIMARY SERVICE:  PCCM CONSULTANTS: None CODE STATUS:  Full DIET:  Modified carbohydrate DVT Px:  SQ heparin GI Px:  None indicated    INTERVAL HISTORY:  Post VATS, sedated on vent  VITAL SIGNS: Temp:  [97.5 F (36.4 C)-98 F (36.7 C)] 97.5 F (36.4 C) (12/24 0400) Pulse Rate:  [64-103] 64  (12/24 1311) Resp:  [13-23] 13  (12/24 1311) BP: (109-150)/(66-89) 136/83 mmHg (12/24 1311) SpO2:  [93 %-100 %] 100 % (12/24 1311)   PHYSICAL EXAMINATION: General:  Elderly male, in NAD on vent Neuro: drowsy, arouses follows commands appropriately.  HEENT:  WNL Cardiovascular:  RRR  Lungs:  No wheezes, diminished breath sounds throughout.  Improved mechanics on PCV Abdomen:  NT,  soft / active bs Ext: No edema, no cyanosis   Lab 08/16/12 2052 08/16/12 1220 08/11/12 0345  NA 137 139 139  K 3.8 4.3 3.9  CL 101 101 100  CO2 28 25 29   BUN 18 17 17   CREATININE 0.88 0.94 0.81  GLUCOSE 96 157* 101*    Lab 08/16/12 2052 08/16/12 1220 08/11/12 0345  HGB 12.4* 14.9 12.4*  HCT 39.7 45.6 39.0  WBC 10.3 13.5* 12.8*  PLT 238 251 263   CXR:  12/23 >> Some increase in volume of the right pneumothorax with a more prominent basilar component.    IMPRESSION:  Spontaneous pneumothorax Initially with spontaneous PTX on admit, resolved and CT removed with re-occurrence.  Required replacement CT on 12/22.  Underwent VATS on 12/24 with bleb stapling and CT placement.    COPD with chronic bronchitis-oxygen dependent - completed  doxycycline for AECOPD 12/23.  Chronic pred @ 20 mg/day.  Currently without active bronchospasm   PLAN: - NO suction per CVTS on chest tube - check cxr in am - adjust meds post VATS for COPD - Stress steroids (chronically on 20 mg pred) - PCV  - fentanyl gtt with intermittent versed pushes - d/c precedex - Chest tubes per CVTS - f/u abg, cxr now and in am   Atrial Fibrillation  HLD Hx of Afib - has been in sinus rhythm during this admit on rhythmol & verapamil (ER).  Mild Hypotension post op 12/24 (SBP 90's).    PLAN: - consider change verapamil to immediate release form with mild reduction in dose if pressure remains low off precedex - neo if any pressor use - ASA  Hyperglycemia In setting of stress steroids / chronic steroid usage.    PLAN: -SSI  Pain In setting of post op VATS  PLAN: - fentanyl gtt - On-Q per CVTS  Canary Brim, NP-C [facility] Pgr: [phone] or [phone]  Will change to PCV to attempt and minimize airleak, will also keep CT on water seal given high leak on suction, ABG to follow.  Keep fairly sedate overnight to avoid high RR.  Will re-evaluate in AM.  CC time 35 min.  Patient  seen and examined, agree with above note.  I dictated the care and orders written for this patient under my direction.  Koren Bound, M.D. [phone]

## 2012-08-17 NOTE — Op Note (Signed)
NAMESHAMOND, SKELTON NO.:  [id number]  MEDICAL RECORD NO.:  [id number]  LOCATION:  2305                         FACILITY:  [facility]  PHYSICIAN:  [name], M.D.  DATE OF BIRTH:  07/30/1941  DATE OF PROCEDURE:  08/16/2012 DATE OF DISCHARGE:                              OPERATIVE REPORT   PREOPERATIVE DIAGNOSIS:  Persistent-recurrent right spontaneous pneumothorax.  POSTOPERATIVE DIAGNOSIS:  Persistent-recurrent right spontaneous pneumothorax.  OPERATION:  Placement of right chest tube.  SURGEON:  [name], MD  ANESTHESIA:  Local 1% lidocaine.  OPERATIVE PROCEDURE:  The patient was brought to the intensive care unit after a CT scan of the chest previously, today showed a large right pneumothorax with tension and a pigtail catheter placed by pulmonary medicine in the pleural space, but not draining the pneumothorax. Informed consent was obtained for a new chest tube.  The previously placed pigtail catheter was removed.  The chest was prepped and draped as a sterile field.  Local 1% lidocaine was infiltrated in the fifth interspace beneath the right breast crease.  A small 1 inch incision was made and further lidocaine was infiltrated down to the chest wall.  A hemostat was used to enter the right pleural space.  A 28-French chest tube was guided into the right pleural space and directed toward the apex, connected to a underwater seal Pleur-Evac drainage system.  The chest tube was secured with 2 silk sutures, and a sterile dressing was applied.  Chest x-ray is pending.  The patient tolerated the procedure well.     [name], M.D.     PV/MEDQ  D:  08/16/2012  T:  08/17/2012  Job:  [id number]

## 2012-08-17 NOTE — Brief Op Note (Signed)
08/10/2012 - 08/17/2012  12:18 PM  PATIENT:  Mark [name]  71 y.o. male  PRE-OPERATIVE DIAGNOSIS:  1. Right recurrent spontaneous pneumothorax 2. Severe COPD  POST-OPERATIVE DIAGNOSIS:  1. Right recurrent spontaneous pneumothorax 2. Severe COPD  PROCEDURE: RIGHT VIDEO ASSISTED THORACOSCOPY , RIGHT MINI THORACOTOMY, RESECTION/STAPELING OF MULTIPLE BLEBS, MECHANICAL PLEURODESIS, and ON Q PLACEMENT  SURGEON:  Surgeon(s) and Role:    * [name], MD - Primary  PHYSICIAN ASSISTANT: Doree Fudge PA-C   ANESTHESIA:   general  EBL:  Total I/O In: 1000 [I.V.:1000] Out: 1250 [Urine:1050; Blood:200]  BLOOD ADMINISTERED:none  DRAINS: 80 French straight Chest Tube(s) in the right pleural space and (a 28 ) Blake drain(s) in the right pleural space   LOCAL MEDICATIONS USED:  BUPIVICAINE   SPECIMEN:  Source of Specimen:  Multiple blebs  DISPOSITION OF SPECIMEN:  PATHOLOGY  COUNTS CORRECT:  YES  DICTATION: .Dragon Dictation  PLAN OF CARE: Admit to inpatient   PATIENT DISPOSITION:  ICU - intubated and hemodynamically stable.   Delay start of Pharmacological VTE agent (>24hrs) due to surgical blood loss or risk of bleeding: yes

## 2012-08-17 NOTE — Anesthesia Preprocedure Evaluation (Addendum)
Anesthesia Evaluation  Patient identified by MRN, date of birth, ID band Patient awake    Reviewed: Allergy & Precautions, H&P , NPO status , Patient's Chart, lab work & pertinent test results  History of Anesthesia Complications Negative for: history of anesthetic complications  Airway Mallampati: II TM Distance: >3 FB Neck ROM: Full    Dental  (+) Poor Dentition and Dental Advisory Given   Pulmonary shortness of breath, with exertion and at rest, COPD COPD inhaler,  S/p tension pneumo   lg apical bleb  Chest tube in place SOB at rest  breath sounds clear to auscultation        Cardiovascular + dysrhythmias Atrial Fibrillation Rhythm:Regular Rate:Normal  Hx afib NSR now  Weaned off coumadin normal INR Echo 2011 ef - 55%    Neuro/Psych  Headaches,    GI/Hepatic   Endo/Other    Renal/GU      Musculoskeletal   Abdominal   Peds  Hematology   Anesthesia Other Findings   Reproductive/Obstetrics                          Anesthesia Physical Anesthesia Plan  ASA: III  Anesthesia Plan: General   Post-op Pain Management:    Induction: Intravenous  Airway Management Planned: Double Lumen EBT  Additional Equipment: Arterial line and CVP  Intra-op Plan:   Post-operative Plan: Extubation in OR and Possible Post-op intubation/ventilation  Informed Consent: I have reviewed the patients History and Physical, chart, labs and discussed the procedure including the risks, benefits and alternatives for the proposed anesthesia with the patient or authorized representative who has indicated his/her understanding and acceptance.   Dental advisory given  Plan Discussed with: CRNA and Anesthesiologist  Anesthesia Plan Comments:        Anesthesia Quick Evaluation

## 2012-08-17 NOTE — Anesthesia Postprocedure Evaluation (Signed)
  Anesthesia Post-op Note  Patient: Mark [name]  Procedure(s) Performed: Procedure(s) (LRB) with comments: VIDEO ASSISTED THORACOSCOPY (Right) RESECTION OF APICAL BLEB (Right) - stapling of bleb  Patient Location: ICU  Anesthesia Type:General  Level of Consciousness: sedated and unresponsive  Airway and Oxygen Therapy: Patient remains intubated per anesthesia plan  Post-op Pain: none  Post-op Assessment: Post-op Vital signs reviewed, Patient's Cardiovascular Status Stable and Respiratory Function Stable  Post-op Vital Signs: Reviewed and stable  Complications: No apparent anesthesia complications

## 2012-08-17 NOTE — Progress Notes (Signed)
The patient was examined and preop studies reviewed. There has been no change from the prior exam and the patient is ready for surgery.  Plan R VATS for spont PNTX in [employer]

## 2012-08-17 NOTE — Anesthesia Procedure Notes (Signed)
Procedure Name: Intubation Date/Time: 08/17/2012 8:05 AM Performed by: Ferol Luz L Pre-anesthesia Checklist: Patient identified, Emergency Drugs available, Suction available, Patient being monitored and Timeout performed Patient Re-evaluated:Patient Re-evaluated prior to inductionOxygen Delivery Method: Circle system utilized Preoxygenation: Pre-oxygenation with 100% oxygen Intubation Type: IV induction and Cricoid Pressure applied Ventilation: Mask ventilation without difficulty Laryngoscope Size: Mac and 4 Grade View: Grade II Endobronchial tube: Left, Double lumen EBT, EBT position confirmed by auscultation and EBT position confirmed by fiberoptic bronchoscope and 39 Fr Number of attempts: 1 Placement Confirmation: ETT inserted through vocal cords under direct vision and positive ETCO2 (r ptx ) Tube secured with: Tape Dental Injury: Teeth and Oropharynx as per pre-operative assessment

## 2012-08-18 ENCOUNTER — Inpatient Hospital Stay (HOSPITAL_COMMUNITY): Payer: Medicare Other

## 2012-08-18 LAB — POCT I-STAT 3, ART BLOOD GAS (G3+)
Patient temperature: 98.8
Patient temperature: 98.7
TCO2: 24 mmol/L (ref 0–100)
TCO2: 24 mmol/L (ref 0–100)
pCO2 arterial: 26.7 mmHg — ABNORMAL LOW (ref 35.0–45.0)
pCO2 arterial: 28.5 mmHg — ABNORMAL LOW (ref 35.0–45.0)
pH, Arterial: 7.549 — ABNORMAL HIGH (ref 7.350–7.450)
pH, Arterial: 7.516 — ABNORMAL HIGH (ref 7.350–7.450)

## 2012-08-18 LAB — CBC
HCT: 36.8 % — ABNORMAL LOW (ref 39.0–52.0)
Hemoglobin: 12.2 g/dL — ABNORMAL LOW (ref 13.0–17.0)
MCHC: 33.2 g/dL (ref 30.0–36.0)
MCV: 94.8 fL (ref 78.0–100.0)
RDW: 14.4 % (ref 11.5–15.5)

## 2012-08-18 LAB — BLOOD GAS, ARTERIAL
Bicarbonate: 21.3 mEq/L (ref 20.0–24.0)
O2 Saturation: 97.7 %
PEEP: 5 cmH2O
Patient temperature: 98.6
Pressure control: 15 cmH2O
pH, Arterial: 7.562 — ABNORMAL HIGH (ref 7.350–7.450)

## 2012-08-18 LAB — BASIC METABOLIC PANEL
BUN: 10 mg/dL (ref 6–23)
Chloride: 105 mEq/L (ref 96–112)
Glucose, Bld: 111 mg/dL — ABNORMAL HIGH (ref 70–99)
Potassium: 3.6 mEq/L (ref 3.5–5.1)

## 2012-08-18 LAB — GLUCOSE, CAPILLARY
Glucose-Capillary: 93 mg/dL (ref 70–99)
Glucose-Capillary: 101 mg/dL — ABNORMAL HIGH (ref 70–99)
Glucose-Capillary: 93 mg/dL (ref 70–99)

## 2012-08-18 MED ORDER — PROPAFENONE HCL 225 MG PO TABS: 225.0000 mg | ORAL_TABLET | Freq: Three times a day (TID) | ORAL | Status: DC

## 2012-08-18 MED ORDER — METHYLPREDNISOLONE SODIUM SUCC 40 MG IJ SOLR
20.0000 mg | Freq: Two times a day (BID) | INTRAMUSCULAR | Status: DC
  Administered 2012-08-18 (×2): 20 mg via INTRAVENOUS
  Filled 2012-08-18 (×4): qty 0.5

## 2012-08-18 MED ORDER — PROPAFENONE HCL 225 MG PO TABS
225.0000 mg | ORAL_TABLET | Freq: Two times a day (BID) | ORAL | Status: DC
  Administered 2012-08-18: 225 mg via ORAL
  Filled 2012-08-18 (×4): qty 1

## 2012-08-18 MED ORDER — INSULIN ASPART 100 UNIT/ML ~~LOC~~ SOLN
0.0000 [IU] | Freq: Three times a day (TID) | SUBCUTANEOUS | Status: DC
  Administered 2012-08-19: 18:00:00 via SUBCUTANEOUS
  Administered 2012-08-20 – 2012-08-21 (×2): 2 [IU] via SUBCUTANEOUS

## 2012-08-18 MED ORDER — POTASSIUM CHLORIDE 10 MEQ/50ML IV SOLN
INTRAVENOUS | Status: AC
  Administered 2012-08-18: 10 meq via INTRAVENOUS
  Filled 2012-08-18: qty 150

## 2012-08-18 MED ORDER — POTASSIUM CHLORIDE 10 MEQ/50ML IV SOLN
10.0000 meq | INTRAVENOUS | Status: AC | PRN
  Administered 2012-08-18 (×3): 10 meq via INTRAVENOUS

## 2012-08-18 MED ORDER — VERAPAMIL HCL ER 240 MG PO TBCR: 240.0000 mg | EXTENDED_RELEASE_TABLET | Freq: Every day | ORAL | Status: DC

## 2012-08-18 MED ORDER — METHYLPREDNISOLONE SODIUM SUCC 125 MG IJ SOLR
20.0000 mg | Freq: Two times a day (BID) | INTRAMUSCULAR | Status: DC
  Filled 2012-08-18 (×2): qty 0.32

## 2012-08-18 MED ORDER — INSULIN ASPART 100 UNIT/ML ~~LOC~~ SOLN: 0.0000 [IU] | Freq: Every day | SUBCUTANEOUS | Status: DC

## 2012-08-18 MED ORDER — FENTANYL CITRATE 0.05 MG/ML IJ SOLN
12.5000 ug | INTRAMUSCULAR | Status: DC | PRN
  Administered 2012-08-19 – 2012-08-22 (×3): 12.5 ug via INTRAVENOUS
  Administered 2012-08-24: 25 ug via INTRAVENOUS
  Filled 2012-08-18 (×4): qty 2

## 2012-08-18 MED ORDER — METOCLOPRAMIDE HCL 5 MG/ML IJ SOLN
10.0000 mg | Freq: Four times a day (QID) | INTRAMUSCULAR | Status: DC | PRN
  Filled 2012-08-18: qty 2

## 2012-08-18 MED ORDER — METOPROLOL TARTRATE 1 MG/ML IV SOLN
2.5000 mg | INTRAVENOUS | Status: DC | PRN
  Administered 2012-08-18: 5 mg via INTRAVENOUS
  Administered 2012-08-18 (×2): 2.5 mg via INTRAVENOUS
  Filled 2012-08-18 (×2): qty 5

## 2012-08-18 MED ORDER — ENOXAPARIN SODIUM 40 MG/0.4ML ~~LOC~~ SOLN
40.0000 mg | SUBCUTANEOUS | Status: DC
  Administered 2012-08-18 – 2012-08-30 (×13): 40 mg via SUBCUTANEOUS
  Filled 2012-08-18 (×14): qty 0.4

## 2012-08-18 NOTE — Procedures (Signed)
Extubation Procedure Note  Patient Details:   Name: Mark [name] DOB: April 30, 1941 MRN: [id number]   Pt extubated per MD order. VS stable, pt able to vocalize, no stridor noted. Pt tolerating well at this time, RT will continue to monitor.    Evaluation  O2 sats: stable throughout Complications: No apparent complications Patient did tolerate procedure well. Bilateral Breath Sounds: Diminished;Clear   Yes  Harley Hallmark 08/18/2012, 8:56 AM

## 2012-08-18 NOTE — Progress Notes (Signed)
Patient's IV fentanyl discontinued prior to extubation.  120 ml of IV fentanyl wasted in sink and verified by second RN, Francetta Found.  Keitha Butte, RN

## 2012-08-18 NOTE — Op Note (Signed)
NAMEHARM, JOU NO.:  [id number]  MEDICAL RECORD NO.:  [id number]  LOCATION:  2305                         FACILITY:  [facility]  PHYSICIAN:  [name], M.D.  DATE OF BIRTH:  July 17, 1941  DATE OF PROCEDURE:  08/17/2012 DATE OF DISCHARGE:                              OPERATIVE REPORT   OPERATION: 1. Right VATS (video-assisted thoracoscopic surgery), stapling of     blebs, pleurodesis. 2. Right mini thoracotomy for control of air leak from staple line. 3. Placement of On-Q wound irrigation pain system.  SURGEON:  [name], MD.  ASSISTANT:  Doree Fudge, PA-C  ANESTHESIA:  General by Dr. Zenon Mayo, MD.  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  CLINICAL NOTE:  The patient is a 71 year old gentleman with home oxygen- dependent end-stage emphysema who presented with a pneumothorax and had a pigtail catheter placed by the Pulmonary Service, this re-expanded the lung partially.  The pigtail catheter was removed and pneumothorax recurred.  The second pigtail catheter was placed and the patient still developed evidence of a tension pneumothorax.  I placed a urgent chest tube in the right pleural space at the bedside due to the tension pneumothorax and recommended a VATS procedure for this persistent pneumothorax.  Preoperative CT scan demonstrated significant blood disease of the right lung.  I discussed the indications, benefits, and risks of right VATS, bleb resection, and pleurodesis for treatment of his spontaneous pneumothorax.  I discussed the use of general anesthesia, location of the surgical incisions, and the expected postoperative recovery.  I discussed due to his severe end-stage COPD with FEV1 less than  20% predicted that he may need ventilator assistance postoperatively.  The patient and family demonstrated their understanding and agreed to proceed with surgery under what I felt was an informed consent.  FINDINGS: 1. Ruptured bleb at the apex. 2. Some adhesions which were taken , so the bleb could be stapled and     resected. 3. Large bleb in the lower lobe which was resected. 4. Severe emphysematous changes in the lung with extremely thin     delicate friable lung tissue with air leak at the staple lines     requiring miniature thoracotomy for repair.  OPERATIVE PROCEDURE:  The patient was brought to the OR and placed supine on the operating table.  General anesthesia was induced.  A double-lumen endotracheal tube was positioned by the anesthesiologist. The patient was turned right side up, and a proper time-out was performed.  The right chest was prepped and draped as a sterile field. Three small VATS portal incisions were made anteriorly, at the tip of the scapula, and posteriorly.  The VATS camera and instruments were inserted.  The lung was inspected.  There was very emphysematous with a thinned out weak lung tissue with hardly any lung parenchyma.  The lung was hyperinflated.  The large blebs at the right lower lobe was resected using Endo-GIA stapling device.  The adhesions were partially taken down at the apex to resect the  upper lobe bleb.  A pleurodesis was then performed circumferentially around the upper half of the right hemithorax on the parietal pleura.  Two chest tubes were placed and we started to close the incisions.  The patient demonstrated evidence of a large air leak, loss of tidal volume.  We inserted the scope but really could not tell what was leaking.  The scope was removed.  The anterior portal incision was then extended, another 2 inches and the miniature retractor was placed to retract the ribs, but did not divide the rib.  The remainder of the  adhesions at the apex were taken down and the entire lung was mobilized and inspected. There was some remaining bleb disease at the apex which was removed with an Endo-GIA stapling device.  There was an area of exposed overall lung surface beneath a staple line in the right lower lobe and this was closed with running chromic and covered with a medical sealant-Progel. There was a deeper area of air leak in the fissure between the lower lobe and the middle lobe.  This was mobilized and inspected and due to its location another running suture line of 4-0 chromic was placed, covered with a medical adhesive sealant-Progel.  Warm saline was then placed into the hemithorax and the lungs were gently inflated.  There was an air leak but it appeared to be manageable.  Anterior and posterior chest tubes were then placed and brought through separate incisions.  The inferior pulmonary ligament was taken down to help the lower lobe be mobile.  The patient remained hemodynamically stable. Good oxygen saturation 99-100%.  The chest was closed with interrupted #2 Vicryl for the pericostals. The muscle was closed with interrupted #1 Vicryl.  The subcutaneous and skin layers were closed with a running Vicryl.  The subcuticular layer was not used due to the fragile nature of the patient's skin from chronic steroids and interrupted nylons were used.  The remaining VATS incisions were closed in layers using Vicryl for the deep layers and interrupted nylon for the skin.  The old chest tube site was also closed in a similar fashion.  An On-Q catheter was placed beneath the main incision, secured to the skin and flushed with 0.5% Marcaine.  The chest tubes were connected to an underwater seal Pleur-Evac drainage and kept water-seal due to a significant air leak when suction was applied in the system.  A chest x-ray was taken in the operating room showed lungs to be well- expanded except for a small  subpulmonic space.  The blood gas was checked and was normal.  The patient was then exchanged from the double- lumen tube to a single-lumen tube and then transported back to the intensive care unit in stable condition.     [name], M.D.     PV/MEDQ  D:  08/17/2012  T:  08/18/2012  Job:  [id number]

## 2012-08-18 NOTE — Progress Notes (Signed)
PULMONARY  / CRITICAL CARE MEDICINE  Name: Mark [name] MRN: [id number] DOB: [birth year]    LOS: 8  REFERRING PROVIDER:  [name]   CHIEF COMPLAINT:  Pneumothorax  BRIEF PATIENT DESCRIPTION: 48 yowm former smoker,  with PMH of steroid / O2 dependent (20mg ) COPD, Chronic Afib who was seen in Pulmonary Office 12/17 for 3 day history of worsening shortness of breath, decreased O2 levels, decreased energy.  Found to have moderate R pneumothorax.  CT placed and admitted for further care.    LINES / TUBES: R CT  12/17 >>>12/21 Repeat R CT 12/22>>> OETT 12/24>>> R  dual lumen12/24>>>   CULTURES: MRSA screen 12/18 > negative 12/24 VATS Bx>>>   ANTIBIOTICS: 12/16 Doxycycline (AECOPD) > 12/23  12/24 Zinacef  SIGNIFICANT EVENTS:  12/16 - started on doxy for worsening SOB, decreased sats 12/17 - Acute office visit for above, found to have R basilar 20% PTX 12/19 - residual small apical ptx, no airleak 12/20 - CT on water seal??  Patient took off suction to go to bathroom, lung with tiny residual ptx 12/21 - CT removed 12/22 - repeat 40% ptx, ct replaced 12/24 - R  VATS with resection / stapling of blebs, mechanical pleurodesis & CT placement.  Returned to ICU on vent    INTERVAL HISTORY:  Post VATS, awake  on vent  VITAL SIGNS: Temp:  [98.8 F (37.1 C)-99.1 F (37.3 C)] 98.8 F (37.1 C) (12/25 0350) Pulse Rate:  [60-116] 90  (12/25 0700) Resp:  [13-29] 14  (12/25 0700) BP: (83-165)/(52-99) 132/70 mmHg (12/25 0700) SpO2:  [96 %-100 %] 99 % (12/25 0700) Arterial Line BP: (85-161)/(47-75) 127/63 mmHg (12/25 0700) FiO2 (%):  [40 %-50 %] 40 % (12/25 0700)   PHYSICAL EXAMINATION: General:  Elderly male, in NAD on vent Neuro: drowsy, arouses follows commands appropriately.  HEENT:  WNL Cardiovascular:  RRR  Lungs:  No wheezes, diminished breath sounds on left, bubbling on rt, air leak ++ Abdomen:  NT, soft / active bs Ext: No edema, no cyanosis   Lab 08/18/12 0420  08/16/12 2052 08/16/12 1220  NA 139 137 139  K 3.6 3.8 4.3  CL 105 101 101  CO2 21 28 25   BUN 10 18 17   CREATININE 0.66 0.88 0.94  GLUCOSE 111* 96 157*    Lab 08/18/12 0420 08/16/12 2052 08/16/12 1220  HGB 12.2* 12.4* 14.9  HCT 36.8* 39.7 45.6  WBC 13.9* 10.3 13.5*  PLT 213 238 251   CXR:  12/25 >> expanded rt lung - chest tubes    IMPRESSION:  Spontaneous pneumothorax Initially with spontaneous PTX on admit, resolved and CT removed with re-occurrence.  Required replacement CT on 12/22.  Underwent VATS on 12/24 with bleb stapling and CT placement.    COPD with chronic bronchitis-oxygen dependent - completed  doxycycline for AECOPD 12/23.  Chronic pred @ 20 mg/day.  Currently without active bronchospasm   PLAN: -  suction per CVTS on chest tube - Stress steroids (chronically on 20 mg pred)-solumedrol 20 q 12 - SBts on PS/CPAP - tolerating PS 5/5 - extubate - fentanyl gtt with intermittent versed pushes - d/c precedex - Chest tubes per CVTS    Atrial Fibrillation  HLD Hx of Afib - has been in sinus rhythm during this admit on rhythmol & verapamil (ER).  Mild Hypotension post op 12/24 (SBP 90's).    PLAN: - consider change verapamil to immediate release form with mild reduction in dose if pressure remains low  off precedex - neo if any pressor use - ASA -lopressor IV until rhythmol can be resumed  Hyperglycemia In setting of stress steroids / chronic steroid usage.    PLAN: -SSI  Pain In setting of post op VATS  PLAN: - fentanyl int - On-Q per CVTS   CC time 35 min.    Oretha Milch., M.D.  [phone]

## 2012-08-18 NOTE — Progress Notes (Signed)
1 Day Post-Op Procedure(s) (LRB): VIDEO ASSISTED THORACOSCOPY (Right) RESECTION OF APICAL BLEB (Right) Subjective: R VATS bleb resection - spont PNTX endstage COPD Awake on vent R chest subQ air stable Large air leak from staple lines Objective: Vital signs in last 24 hours: Temp:  [98.8 F (37.1 C)-99.1 F (37.3 C)] 98.8 F (37.1 C) (12/25 0350) Pulse Rate:  [60-116] 91  (12/25 0600) Cardiac Rhythm:  [-] Normal sinus rhythm (12/25 0600) Resp:  [13-29] 18  (12/25 0600) BP: (83-165)/(52-99) 116/99 mmHg (12/25 0600) SpO2:  [96 %-100 %] 99 % (12/25 0600) Arterial Line BP: (85-161)/(47-75) 141/63 mmHg (12/25 0600) FiO2 (%):  [40 %-50 %] 40 % (12/25 0600)  Hemodynamic parameters for last 24 hours:   NSR rate 108 Intake/Output from previous day: 12/24 0701 - 12/25 0700 In: 4696.4 [I.V.:4286.4; NG/GT:60; IV Piggyback:350] Out: 3250 [Urine:2700; Blood:200; Chest Tube:350] Intake/Output this shift: Total I/O In: 1446.7 [I.V.:1166.7; NG/GT:30; IV Piggyback:250] Out: 1250 [Urine:990; Chest Tube:260]  Distant bs  Lab Results:  [facility] 08/18/12 0420 08/16/12 2052  WBC 13.9* 10.3  HGB 12.2* 12.4*  HCT 36.8* 39.7  PLT 213 238   BMET:  Basename 08/18/12 0420 08/16/12 2052  NA 139 137  K 3.6 3.8  CL 105 101  CO2 21 28  GLUCOSE 111* 96  BUN 10 18  CREATININE 0.66 0.88  CALCIUM 8.1* 9.3    PT/INR:  Basename 08/16/12 1220  LABPROT 12.6  INR 0.95   ABG    Component Value Date/Time   PHART 7.562* 08/18/2012 0447   HCO3 21.3 08/18/2012 0447   TCO2 22.0 08/18/2012 0447   ACIDBASEDEF 0.8 08/18/2012 0447   O2SAT 97.7 08/18/2012 0447   CBG (last 3)   Basename 08/18/12 0349 08/17/12 2314 08/17/12 1943  GLUCAP 110* 94 106*    Assessment/Plan: S/P Procedure(s) (LRB): VIDEO ASSISTED THORACOSCOPY (Right) RESECTION OF APICAL BLEB (Right) Plan wean and extubate to reduce air leak from positive airway pressure Cont IV steroids and antibiotics  LOS: 8 days    VAN  TRIGT III,PETER 08/18/2012

## 2012-08-19 ENCOUNTER — Inpatient Hospital Stay (HOSPITAL_COMMUNITY): Payer: Medicare Other

## 2012-08-19 LAB — CBC
MCH: 31.4 pg (ref 26.0–34.0)
MCHC: 32.1 g/dL (ref 30.0–36.0)
Platelets: 213 10*3/uL (ref 150–400)
RBC: 3.63 MIL/uL — ABNORMAL LOW (ref 4.22–5.81)

## 2012-08-19 LAB — GLUCOSE, CAPILLARY
Glucose-Capillary: 165 mg/dL — ABNORMAL HIGH (ref 70–99)
Glucose-Capillary: 139 mg/dL — ABNORMAL HIGH (ref 70–99)

## 2012-08-19 LAB — COMPREHENSIVE METABOLIC PANEL
ALT: 20 U/L (ref 0–53)
AST: 33 U/L (ref 0–37)
CO2: 24 mEq/L (ref 19–32)
Calcium: 8 mg/dL — ABNORMAL LOW (ref 8.4–10.5)
Potassium: 4.5 mEq/L (ref 3.5–5.1)
Sodium: 139 mEq/L (ref 135–145)
Total Protein: 5.6 g/dL — ABNORMAL LOW (ref 6.0–8.3)

## 2012-08-19 MED ORDER — PREDNISONE 20 MG PO TABS
20.0000 mg | ORAL_TABLET | Freq: Every day | ORAL | Status: DC
  Administered 2012-08-19 – 2012-08-31 (×13): 20 mg via ORAL
  Filled 2012-08-19 (×14): qty 1

## 2012-08-19 MED ORDER — LEVALBUTEROL HCL 0.63 MG/3ML IN NEBU
0.6300 mg | INHALATION_SOLUTION | Freq: Four times a day (QID) | RESPIRATORY_TRACT | Status: DC
  Administered 2012-08-20 – 2012-08-31 (×43): 0.63 mg via RESPIRATORY_TRACT
  Filled 2012-08-19 (×50): qty 3

## 2012-08-19 MED ORDER — BUDESONIDE-FORMOTEROL FUMARATE 160-4.5 MCG/ACT IN AERO
2.0000 | INHALATION_SPRAY | Freq: Two times a day (BID) | RESPIRATORY_TRACT | Status: DC
  Administered 2012-08-19 – 2012-08-31 (×25): 2 via RESPIRATORY_TRACT
  Filled 2012-08-19 (×3): qty 6

## 2012-08-19 MED ORDER — DEXTROSE 5 % IV SOLN
1.0000 g | Freq: Three times a day (TID) | INTRAVENOUS | Status: DC
  Administered 2012-08-19 – 2012-08-30 (×33): 1 g via INTRAVENOUS
  Filled 2012-08-19 (×36): qty 1

## 2012-08-19 MED ORDER — FUROSEMIDE 40 MG PO TABS
40.0000 mg | ORAL_TABLET | Freq: Every day | ORAL | Status: DC
  Administered 2012-08-19 – 2012-08-26 (×8): 40 mg via ORAL
  Filled 2012-08-19 (×9): qty 1

## 2012-08-19 MED ORDER — PREDNISONE 10 MG PO TABS: 10.0000 mg | ORAL_TABLET | Freq: Every day | ORAL | Status: DC

## 2012-08-19 NOTE — Progress Notes (Signed)
TCTS BRIEF SICU PROGRESS NOTE  2 Days Post-Op  S/P Procedure(s) (LRB): VIDEO ASSISTED THORACOSCOPY (Right) RESECTION OF APICAL BLEB (Right)   Stable day  Plan: Continue current plan  Verbena Boeding H 08/19/2012 4:14 PM

## 2012-08-19 NOTE — Progress Notes (Signed)
2 Days Post-Op Procedure(s) (LRB): VIDEO ASSISTED THORACOSCOPY (Right) RESECTION OF APICAL BLEB (Right) Subjective:  End stage COPD VATS for spont pntx Lung expanded- large air leak, leave on 10 cm suct[employer]on Inc WBC, w/ air space dz cont iv fortaz Adv diet  Objective: Vital signs in last 24 hours: Temp:  [97.5 F (36.4 C)-98.7 F (37.1 C)] 97.5 F (36.4 C) (12/26 0803) Pulse Rate:  [86-132] 86  (12/26 0700) Cardiac Rhythm:  [-] Normal sinus rhythm (12/26 0700) Resp:  [18-27] 25  (12/26 0700) BP: (105-181)/(62-89) 122/64 mmHg (12/26 0700) SpO2:  [86 %-99 %] 96 % (12/26 0700) Arterial Line BP: (113-141)/(61-66) 113/61 mmHg (12/25 1200) Weight:  [168 lb 14 oz (76.6 kg)] 168 lb 14 oz (76.6 kg) (12/26 0600)  Hemodynamic parameters for last 24 hours:   stable Intake/Output from previous day: 12/25 0701 - 12/26 0700 In: 3295 [P.O.:940; I.V.:2155; IV Piggyback:200] Out: 2990 [Urine:2090; Emesis/NG output:150; Chest Tube:750] Intake/Output this shift:    Distant bs Mild edema  Lab Results:  Basename 08/19/12 0500 08/18/12 0420  WBC 16.5* 13.9*  HGB 11.4* 12.2*  HCT 35.5* 36.8*  PLT 213 213   BMET:  Basename 08/19/12 0500 08/18/12 0420  NA 139 139  K 4.5 3.6  CL 106 105  CO2 24 21  GLUCOSE 106* 111*  BUN 12 10  CREATININE 0.71 0.66  CALCIUM 8.0* 8.1*    PT/INR:  Basename 08/16/12 1220  LABPROT 12.6  INR 0.95   ABG    Component Value Date/Time   PHART 7.516* 08/18/2012 0828   HCO3 23.1 08/18/2012 0828   TCO2 24 08/18/2012 0828   ACIDBASEDEF 0.8 08/18/2012 0447   O2SAT 99.0 08/18/2012 0828   CBG (last 3)   Basename 08/19/12 08/18/12 1953 08/18/12 1624  GLUCAP 88 133* 93    Assessment/Plan: S/P Procedure(s) (LRB): VIDEO ASSISTED THORACOSCOPY (Right) RESECTION OF APICAL BLEB (Right) Keep in ICU   LOS: 9 days    VAN TRIGT III,PETER 08/19/2012

## 2012-08-19 NOTE — Progress Notes (Signed)
PULMONARY  / CRITICAL CARE MEDICINE  Name: Mark [name] MRN: [id number] DOB: 1941/06/20    LOS: 9  REFERRING PROVIDER:  [name]   CHIEF COMPLAINT:  Pneumothorax  BRIEF PATIENT DESCRIPTION: 42 yowm former smoker,  with PMH of steroid / O2 dependent (20mg ) COPD, Chronic Afib who was seen in Pulmonary Office 12/17 for 3 day history of worsening shortness of breath, decreased O2 levels, decreased energy.  Found to have moderate R pneumothorax.  CT placed and admitted for further care.    LINES / TUBES: R CT  12/17 >>>12/21 Repeat R CT 12/22>>> OETT 12/24>>> R [place] dual lumen12/24>>>   CULTURES: MRSA screen 12/18 > negative 12/24 VATS Bx>>>   ANTIBIOTICS: 12/16 Doxycycline (AECOPD) > 12/23  12/24 Zinacef  SIGNIFICANT EVENTS:  12/16 - started on doxy for worsening SOB, decreased sats 12/17 - Acute office visit for above, found to have R basilar 20% PTX 12/19 - residual small apical ptx, no airleak 12/20 - CT on water seal??  Patient took off suction to go to bathroom, lung with tiny residual ptx 12/21 - CT removed 12/22 - repeat 40% ptx, ct replaced 12/24 - R  VATS with resection / stapling of blebs, mechanical pleurodesis & CT placement.  Returned to ICU on vent 12/26 -20% apical PNTx, large air leak, on suction; Tachycardia      INTERVAL HISTORY:  Post VATS, awake  on vent  VITAL SIGNS: Temp:  [97.5 F (36.4 C)-98.6 F (37 C)] 97.9 F (36.6 C) (12/26 1638) Pulse Rate:  [74-128] 128  (12/26 1700) Resp:  [19-27] 24  (12/26 1700) BP: (105-157)/(62-93) 157/72 mmHg (12/26 1700) SpO2:  [87 %-99 %] 93 % (12/26 1700) Weight:  [76.6 kg (168 lb 14 oz)] 76.6 kg (168 lb 14 oz) (12/26 0600)   PHYSICAL EXAMINATION: General:  Elderly male, in NAD on vent Neuro: awake and appropirate HEENT:  WNL Cardiovascular:  RRR  Lungs:  No wheezes, diminished breath sounds on left, bubbling on rt, air leak ++ Abdomen:  NT, soft / active bs Ext: No edema, no cyanosis   Lab 08/19/12  0500 08/18/12 0420 08/16/12 2052  NA 139 139 137  K 4.5 3.6 3.8  CL 106 105 101  CO2 24 21 28   BUN 12 10 18   CREATININE 0.71 0.66 0.88  GLUCOSE 106* 111* 96    Lab 08/19/12 0500 08/18/12 0420 08/16/12 2052  HGB 11.4* 12.2* 12.4*  HCT 35.5* 36.8* 39.7  WBC 16.5* 13.9* 10.3  PLT 213 213 238   CXR:  12/25 >> expanded rt lung - chest tubes    IMPRESSION:  Spontaneous pneumothorax Initially with spontaneous PTX on admit, resolved and CT removed with re-occurrence.  Required replacement CT on 12/22.  Underwent VATS on 12/24 with bleb stapling and CT placement.    COPD with chronic bronchitis-oxygen dependent - completed  doxycycline for AECOPD 12/23.  Chronic pred @ 20 mg/day.  Currently without active bronchospasm   PLAN: -  suction per CVTS on chest tube - Prednisone 20 mg  - Chest tubes per CVTS  Atrial Fibrillation  HLD Hx of Afib - has been in sinus rhythm during this admit on rhythmol & verapamil (ER).  Mild Hypotension post op 12/24 (SBP 90's). Now resolved   PLAN:  -verapamil  -propafenone - ASA   Hyperglycemia In setting of stress steroids / chronic steroid usage.    PLAN: -SSI  Pain In setting of post op VATS  PLAN: - fentanyl int for  chest tube pain - On-Q per CVTS   CC time 35 min.     Vanetta Mulders, MD  [facility]  [place], [place]   Pager [phone]

## 2012-08-19 NOTE — Progress Notes (Signed)
2 Days Post-Op Procedure(s) (LRB): VIDEO ASSISTED THORACOSCOPY (Right) RESECTION OF APICAL BLEB (Right) Subjective:  Mark [name] states he has not been able to cough anything up.  He is requesting some Mucinex.    Objective: Vital signs in last 24 hours: Temp:  [98.1 F (36.7 C)-98.7 F (37.1 C)] 98.5 F (36.9 C) (12/26 0400) Pulse Rate:  [86-132] 86  (12/26 0700) Cardiac Rhythm:  [-] Normal sinus rhythm (12/26 0700) Resp:  [6-27] 25  (12/26 0700) BP: (105-181)/(62-89) 122/64 mmHg (12/26 0700) SpO2:  [86 %-99 %] 96 % (12/26 0700) Arterial Line BP: (113-141)/(61-67) 113/61 mmHg (12/25 1200) FiO2 (%):  [40 %] 40 % (12/25 0800) Weight:  [168 lb 14 oz (76.6 kg)] 168 lb 14 oz (76.6 kg) (12/26 0600)    Intake/Output from previous day: 12/25 0701 - 12/26 0700 In: 3295 [P.O.:940; I.V.:2155; IV Piggyback:200] Out: 2990 [Urine:2090; Emesis/NG output:150; Chest Tube:750]  General appearance: alert, cooperative and no distress Heart: regular rate and rhythm Lungs: diminished breath sounds right Abdomen: soft, non-tender; bowel sounds normal; no masses,  no organomegaly Extremities: extremities normal, atraumatic, no cyanosis or edema Wound: clean and dry  Lab Results:  Basename 08/19/12 0500 08/18/12 0420  WBC 16.5* 13.9*  HGB 11.4* 12.2*  HCT 35.5* 36.8*  PLT 213 213   BMET:  Basename 08/19/12 0500 08/18/12 0420  NA 139 139  K 4.5 3.6  CL 106 105  CO2 24 21  GLUCOSE 106* 111*  BUN 12 10  CREATININE 0.71 0.66  CALCIUM 8.0* 8.1*    PT/INR:  Basename 08/16/12 1220  LABPROT 12.6  INR 0.95   ABG    Component Value Date/Time   PHART 7.516* 08/18/2012 0828   HCO3 23.1 08/18/2012 0828   TCO2 24 08/18/2012 0828   ACIDBASEDEF 0.8 08/18/2012 0447   O2SAT 99.0 08/18/2012 0828   CBG (last 3)   Basename 08/19/12 08/18/12 1953 08/18/12 1624  GLUCAP 88 133* 93    Assessment/Plan: S/P Procedure(s) (LRB): VIDEO ASSISTED THORACOSCOPY (Right) RESECTION OF APICAL BLEB  (Right)  1. Chest tubes in place- + moderate size air leak present, leave on suction today 2. Pulm- End Stage COPD, restarted home symbicort, continue nebulizer treatments, will order Mucinex per patient request, CXR with increase in right sided pneumothorax 3. D/C Foley 4. Cont Steroids/ABX    LOS: 9 days    Lowella Dandy 08/19/2012

## 2012-08-20 ENCOUNTER — Inpatient Hospital Stay (HOSPITAL_COMMUNITY): Payer: Medicare Other

## 2012-08-20 LAB — CBC
HCT: 30 % — ABNORMAL LOW (ref 39.0–52.0)
Hemoglobin: 10.1 g/dL — ABNORMAL LOW (ref 13.0–17.0)
MCH: 32.3 pg (ref 26.0–34.0)
MCHC: 33.7 g/dL (ref 30.0–36.0)
MCV: 95.8 fL (ref 78.0–100.0)
Platelets: 196 10*3/uL (ref 150–400)
RBC: 3.13 MIL/uL — ABNORMAL LOW (ref 4.22–5.81)
RDW: 14.7 % (ref 11.5–15.5)
WBC: 11.5 10*3/uL — ABNORMAL HIGH (ref 4.0–10.5)

## 2012-08-20 LAB — BASIC METABOLIC PANEL
BUN: 11 mg/dL (ref 6–23)
CO2: 28 mEq/L (ref 19–32)
Calcium: 7.9 mg/dL — ABNORMAL LOW (ref 8.4–10.5)
Chloride: 100 mEq/L (ref 96–112)
Creatinine, Ser: 0.73 mg/dL (ref 0.50–1.35)
GFR calc Af Amer: >90 mL/min (ref >90)
GFR calc non Af Amer: >90 mL/min (ref >90)
Glucose, Bld: 122 mg/dL — ABNORMAL HIGH (ref 70–99)
Potassium: 3.7 mEq/L (ref 3.5–5.1)
Sodium: 135 mEq/L (ref 135–145)

## 2012-08-20 LAB — GLUCOSE, CAPILLARY
Glucose-Capillary: 106 mg/dL — ABNORMAL HIGH (ref 70–99)
Glucose-Capillary: 127 mg/dL — ABNORMAL HIGH (ref 70–99)
Glucose-Capillary: 128 mg/dL — ABNORMAL HIGH (ref 70–99)
Glucose-Capillary: 110 mg/dL — ABNORMAL HIGH (ref 70–99)

## 2012-08-20 MED ORDER — POTASSIUM CHLORIDE 10 MEQ/50ML IV SOLN
10.0000 meq | INTRAVENOUS | Status: AC
  Administered 2012-08-20 (×2): 10 meq via INTRAVENOUS

## 2012-08-20 MED ORDER — PANTOPRAZOLE SODIUM 40 MG PO TBEC
40.0000 mg | DELAYED_RELEASE_TABLET | Freq: Every day | ORAL | Status: DC
  Administered 2012-08-20 – 2012-08-30 (×10): 40 mg via ORAL
  Filled 2012-08-20 (×10): qty 1

## 2012-08-20 MED ORDER — ENSURE PUDDING PO PUDG
1.0000 | Freq: Three times a day (TID) | ORAL | Status: DC
  Administered 2012-08-20 – 2012-08-25 (×13): 1 via ORAL

## 2012-08-20 MED ORDER — PROMETHAZINE HCL 25 MG/ML IJ SOLN: 12.5000 mg | Freq: Four times a day (QID) | INTRAMUSCULAR | Status: DC | PRN

## 2012-08-20 MED ORDER — SODIUM CHLORIDE 0.9 % IJ SOLN
3.0000 mL | Freq: Two times a day (BID) | INTRAMUSCULAR | Status: DC
  Administered 2012-08-20 – 2012-08-24 (×3): 3 mL via INTRAVENOUS

## 2012-08-20 MED ORDER — DM-GUAIFENESIN ER 30-600 MG PO TB12
1.0000 | ORAL_TABLET | Freq: Two times a day (BID) | ORAL | Status: DC
  Administered 2012-08-20 – 2012-08-26 (×13): 1 via ORAL
  Filled 2012-08-20 (×14): qty 1

## 2012-08-20 MED ORDER — SODIUM CHLORIDE 0.9 % IJ SOLN
10.0000 mL | Freq: Two times a day (BID) | INTRAMUSCULAR | Status: DC
  Administered 2012-08-20 – 2012-08-23 (×6): 10 mL via INTRAVENOUS
  Administered 2012-08-23: 10:00:00 via INTRAVENOUS
  Administered 2012-08-24 (×2): 10 mL via INTRAVENOUS
  Filled 2012-08-20: qty 10

## 2012-08-20 MED ORDER — LACTULOSE 10 GM/15ML PO SOLN
20.0000 g | Freq: Every day | ORAL | Status: DC | PRN
  Filled 2012-08-20: qty 30

## 2012-08-20 NOTE — Progress Notes (Signed)
PULMONARY  / CRITICAL CARE MEDICINE  Name: Mark [name] MRN: [id number] DOB: 03-13-1941    LOS: 10  REFERRING PROVIDER:  [name]   CHIEF COMPLAINT:  Pneumothorax  BRIEF PATIENT DESCRIPTION: 19 yowm former smoker,  with PMH of steroid / O2 dependent (20mg ) COPD, Chronic Afib who was seen in Pulmonary Office 12/17 for 3 day history of worsening shortness of breath, decreased O2 levels, decreased energy.  Found to have moderate R pneumothorax.  CT placed and admitted for further care.  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   LINES / TUBES: R CT  12/17 >>>12/21 Repeat R CT 12/22>>> OETT 12/24>>> R [place] dual lumen12/24>>>   CULTURES: MRSA screen 12/18 > negative 12/24 VATS Bx>>>   ANTIBIOTICS: 12/16 Doxycycline (AECOPD) > 12/23  12/24 Zinacef  SIGNIFICANT EVENTS:  12/16 - started on doxy for worsening SOB, decreased sats 12/17 - Acute office visit for above, found to have R basilar 20% PTX 12/19 - residual small apical ptx, no airleak 12/20 - CT on water seal??  Patient took off suction to go to bathroom, lung with tiny residual ptx 12/21 - CT removed 12/22 - repeat 40% ptx, ct replaced 12/24 - R  VATS with resection / stapling of blebs, mechanical pleurodesis & CT placement.  Returned to ICU on vent 12/26 -20% apical PNTx, large air leak, on suction; Tachycardia      INTERVAL HISTORY:  Post VATS, awake  on vent  VITAL SIGNS: Temp:  [97.2 F (36.2 C)-98.1 F (36.7 C)] 97.8 F (36.6 C) (12/27 1134) Pulse Rate:  [70-141] 116  (12/27 1300) Resp:  [16-25] 25  (12/27 1300) BP: (108-157)/(57-91) 140/74 mmHg (12/27 1300) SpO2:  [90 %-99 %] 99 % (12/27 1300) Weight:  [76 kg (167 lb 8.8 oz)] 76 kg (167 lb 8.8 oz) (12/27 0600)   PHYSICAL EXAMINATION: General:  Elderly male, in NAD on vent Neuro: awake and appropirate HEENT:  WNL Cardiovascular:  RRR  Lungs:  No wheezes, diminished breath sounds on  left, bubbling on rt, air leak ++ Abdomen:  NT, soft / active bs Ext: No edema, no cyanosis   Lab 08/20/12 0427 08/19/12 0500 08/18/12 0420  NA 135 139 139  K 3.7 4.5 3.6  CL 100 106 105  CO2 28 24 21   BUN 11 12 10   CREATININE 0.73 0.71 0.66  GLUCOSE 122* 106* 111*    Lab 08/20/12 0427 08/19/12 0500 08/18/12 0420  HGB 10.1* 11.4* 12.2*  HCT 30.0* 35.5* 36.8*  WBC 11.5* 16.5* 13.9*  PLT 196 213 213   CXR:  12/25 >> expanded rt lung - chest tubes    IMPRESSION:  Spontaneous pneumothorax Initially with spontaneous PTX on admit, resolved and CT removed with re-occurrence.  Required replacement CT on 12/22.  Underwent VATS on 12/24 with bleb stapling and CT placement.    COPD with chronic bronchitis-oxygen dependent - completed  doxycycline for AECOPD 12/23.  Chronic pred @ 20 mg/day.  Currently without active bronchospasm; Coughed a mucous plug today;  PLAN: - suction per CVTS on chest tube - Prednisone 20 mg  - Chest tubes per CVTS - Atrovent was discontinued.   Atrial Fibrillation  Hx of Afib - has been in sinus rhythm during this admit on rhythmol & verapamil (ER).  Mild Hypotension post op 12/24 (SBP 90's). Now resolved  PLAN: -verapamil  -propafenone - ASA   Hyperglycemia In setting of chronic steroid usage.  PLAN: -SSI  Pain In setting of post op VATS PLAN: - On-Q per CVTS   CC time 35 min.     Vanetta Mulders, MD  [facility]  [place], [place]   Pager [phone]

## 2012-08-20 NOTE — Progress Notes (Signed)
INITIAL NUTRITION ASSESSMENT  DOCUMENTATION CODES Per approved criteria  -Not Applicable   INTERVENTION:  Recommend Carbohydrate Modified High Calorie diet RD to continue to follow  NUTRITION DIAGNOSIS: No nutrition diagnosis at this time  Goal: Oral intake with meals to meet >/= 90% of estimated nutrition needs  Monitor:  PO intake, weight, labs, I/O's  Reason for Assessment: Consult  71 y.o. male  Admitting Dx: Spontaneous pneumothorax  ASSESSMENT: Patient admitted after 3 day history of worsening SOB, decreased O2 levels and decreased energy; found to have moderate R pneumothorax; s/p VATS, mini thoracotomy 12/24; patient reports a good appetite; PO intake 100% for breakfast this AM per tray observation; no recent weight loss reported.  Height: Ht Readings from Last 1 Encounters:  08/10/12 5\' 11"  (1.803 m)    Weight: Wt Readings from Last 1 Encounters:  08/20/12 167 lb 8.8 oz (76 kg)    Ideal Body Weight: 47.7 kg  % Ideal Body Weight: 159%  Wt Readings from Last 10 Encounters:  08/20/12 167 lb 8.8 oz (76 kg)  08/20/12 167 lb 8.8 oz (76 kg)  08/10/12 165 lb (74.844 kg)  05/07/12 171 lb (77.565 kg)  11/05/11 175 lb 3.2 oz (79.47 kg)  09/02/11 169 lb (76.658 kg)  05/07/11 174 lb 6.4 oz (79.107 kg)  01/17/11 171 lb 12.8 oz (77.928 kg)  11/04/10 173 lb 6.1 oz (78.645 kg)  09/11/10 169 lb (76.658 kg)    Usual Body Weight: 171 lb  % Usual Body Weight: 97%  BMI:  Body mass index is 23.37 kg/(m^2).  Estimated Nutritional Needs: Kcal: 2000-2300 Protein: 100-110 gm Fluid: 2.0-2.3 L  Skin: chest incision   Diet Order: Carb Control  EDUCATION NEEDS: -No education needs identified at this time   Intake/Output Summary (Last 24 hours) at 08/20/12 0937 Last data filed at 08/20/12 0900  Gross per 24 hour  Intake   1695 ml  Output   4625 ml  Net  -2930 ml    Labs:   Lab 08/20/12 0427 08/19/12 0500 08/18/12 0420  NA 135 139 139  K 3.7 4.5 3.6    CL 100 106 105  CO2 28 24 21   BUN 11 12 10   CREATININE 0.73 0.71 0.66  CALCIUM 7.9* 8.0* 8.1*  MG -- -- 1.9  PHOS -- -- 2.4  GLUCOSE 122* 106* 111*    CBG (last 3)   Basename 08/20/12 0804 08/19/12 2150 08/19/12 1636  GLUCAP 106* 139* 165*    Scheduled Meds:   . aspirin EC  81 mg Oral Daily  . bisacodyl  10 mg Oral QHS  . budesonide-formoterol  2 puff Inhalation BID  . cefTAZidime (FORTAZ)  IV  1 g Intravenous Q8H  . dextromethorphan-guaiFENesin  1 tablet Oral BID  . enoxaparin  40 mg Subcutaneous Q24H  . feeding supplement  1 Container Oral TID WC  . furosemide  40 mg Oral Daily  . insulin aspart  0-15 Units Subcutaneous TID WC  . insulin aspart  0-5 Units Subcutaneous QHS  . ipratropium  0.5 mg Nebulization Q6H  . levalbuterol  0.63 mg Nebulization Q6H  . pantoprazole (PROTONIX) IV  40 mg Intravenous Q24H  . potassium chloride  10 mEq Intravenous Q1 Hr x 2  . predniSONE  20 mg Oral QAC breakfast  . propafenone  225 mg Oral BID  . sodium chloride  10 mL Intravenous Q12H  . sodium chloride  3 mL Intravenous Q12H  . verapamil  240 mg Oral QHS  Continuous Infusions:   . 0.9 % NaCl with KCl 20 mEq / L 50 mL (08/19/12 2000)  . bupivacaine ON-Q pain pump      Past Medical History  Diagnosis Date  . Atrial fibrillation   . Hyperlipidemia   . Weakness   . Dyspnea   . Headache   . Osteopenia   . COPD (chronic obstructive pulmonary disease)   . Allergic rhinitis     Past Surgical History  Procedure Date  . Catheter ablation   . Cataract extraction, bilateral   . Appendectomy   . Inguinal hernia repair   . Video assisted thoracoscopy (vats)/thorocotomy 08/17/2012    resection / stapling of blebs, mechanical pleurodesis     Kirkland Hun, RD, LDN Pager #: [phone] After-Hours Pager #: [phone]

## 2012-08-20 NOTE — Evaluation (Signed)
Physical Therapy Evaluation Patient Details Name: Mark [name] MRN: [id number] DOB: [birth year] Today's Date: 08/20/2012 Time: 4098-1191 PT Time Calculation (min): 24 min  PT Assessment / Plan / Recommendation Clinical Impression  Pt s/p spontaneous PTX with VATS with decr mobility secondary to decr endurance and decr activity tolerance.  Will benefit from PT to address endurance issues as well as further assess balance without equipment as patient's goal is to not use equipment at home.  Should be ok to go home with HHPT with family once medically stable.  Has equipment.      PT Assessment  Patient needs continued PT services    Follow Up Recommendations  Home health PT;Supervision/Assistance - 24 hour                Equipment Recommendations  None recommended by PT    Recommendations for Other Services   None  Frequency Min 3X/week    Precautions / Restrictions Precautions Precautions: Fall Precaution Comments: right chest tube Restrictions Weight Bearing Restrictions: No   Pertinent Vitals/Pain VSS with 6 LO2 with ambulation, 4 L at rest; Some pain      Mobility  Bed Mobility Bed Mobility: Sit to Supine Sit to Supine: 1: +2 Total assist;HOB elevated Sit to Supine: Patient Percentage: 70% Details for Bed Mobility Assistance: Assist for trunk and LES. Transfers Transfers: Sit to Stand;Stand to Sit Sit to Stand: With upper extremity assist;With armrests;From chair/3-in-1;4: Min assist (from low chair) Stand to Sit: 4: Min guard;With upper extremity assist;With armrests;To chair/3-in-1 Details for Transfer Assistance: cues for hand placement Ambulation/Gait Ambulation/Gait Assistance: 4: Min guard Ambulation Distance (Feet): 365 Feet Assistive device:  (pushed wheelchair for equipment) Ambulation/Gait Assistance Details: Pt ambulated with quickened step length on right LE.  Does not seem unsteady when supported with bil UEs.  On 6 L of O2 with ambulation and 4L at  rest. Gait Pattern: Step-through pattern;Decreased stance time - left Gait velocity: good pace, but did take about 5 standing rest breaks to keep DOE less than 2/4 Stairs: No Wheelchair Mobility Wheelchair Mobility: No              PT Diagnosis: Generalized weakness;Acute pain  PT Problem List: Decreased activity tolerance;Decreased balance;Decreased mobility;Decreased knowledge of use of DME;Decreased safety awareness;Decreased knowledge of precautions;Pain PT Treatment Interventions: DME instruction;Gait training;Functional mobility training;Therapeutic activities;Therapeutic exercise;Balance training;Stair training;Patient/family education   PT Goals Acute Rehab PT Goals PT Goal Formulation: With patient Time For Goal Achievement: 08/27/12 Potential to Achieve Goals: Good Pt will go Supine/Side to Sit: Independently PT Goal: Supine/Side to Sit - Progress: Goal set today Pt will go Sit to Stand: Independently PT Goal: Sit to Stand - Progress: Goal set today Pt will Ambulate: 51 - 150 feet;with least restrictive assistive device;with modified independence PT Goal: Ambulate - Progress: Goal set today Pt will Go Up / Down Stairs: 1-2 stairs;with supervision;with least restrictive assistive device PT Goal: Up/Down Stairs - Progress: Goal set today  Visit Information  Last PT Received On: 08/20/12 Assistance Needed: +1    Subjective Data  Subjective: "I have to rest so I can breathe." Patient Stated Goal: To go home   Prior Functioning  Home Living Lives With: Spouse Available Help at Discharge: Family;Available 24 hours/day Type of Home: House Home Access: Stairs to enter [employer] of Steps: 1 Entrance Stairs-Rails: None Home Layout: One level Bathroom Toilet: Standard Home Adaptive Equipment: Bedside commode/3-in-1;Crutches;Grab bars in shower;Hand-held shower hose;Hospital bed;Reacher;Shower chair without back;Walker - rolling;Wheelchair -  manual  Additional Comments: Pt states he was fairly active PTA.  He could put his wheelchair in the [place] independently and he changed a dishwasher out last week.  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 difficulties Dominant Hand: Right    Cognition  Overall Cognitive Status: Appears within functional limits for tasks assessed/performed Arousal/Alertness: Awake/alert Orientation Level: Appears intact for tasks assessed Behavior During Session: [facility] for tasks performed    Extremity/Trunk Assessment Right Lower Extremity Assessment RLE ROM/Strength/Tone: [facility] for tasks assessed Left Lower Extremity Assessment LLE ROM/Strength/Tone: [facility] for tasks assessed Trunk Assessment Trunk Assessment: Normal   Balance Static Standing Balance Static Standing - Balance Support: Bilateral upper extremity supported;During functional activity Static Standing - Level of Assistance: 5: Stand by assistance Static Standing - Comment/# of Minutes: 2 minutes  End of Session PT - End of Session Equipment Utilized During Treatment: Gait belt;Oxygen Activity Tolerance: Patient tolerated treatment well Patient left: in bed;with call bell/phone within reach;with nursing in room Nurse Communication: Mobility status       INGOLD,Ani Deoliveira 08/20/2012, 4:27 PM  [facility] Acute Rehabilitation [phone] (pager)

## 2012-08-20 NOTE — Progress Notes (Signed)
3 Days Post-Op Procedure(s) (LRB): VIDEO ASSISTED THORACOSCOPY (Right) RESECTION OF APICAL BLEB (Right) Subjective: Postop day 3 right VATS for persistent spontaneous pneumothorax with stapling of blebs and pleurodesis Pulmonary status stable although patient short of breath with exertio Patient has persistent large air leak secondary to hyperinflated transparent cellophane-like lung tissue and chronic steroids Chest x-ray shows small pneumothorax on 10 cm suction  Objective: Vital signs in last 24 hours: Temp:  [97.2 F (36.2 C)-98.1 F (36.7 C)] 97.8 F (36.6 C) (12/27 1134) Pulse Rate:  [70-141] 101  (12/27 1400) Cardiac Rhythm:  [-] Sinus tachycardia (12/27 1400) Resp:  [16-25] 21  (12/27 1400) BP: (108-157)/(57-91) 133/67 mmHg (12/27 1400) SpO2:  [90 %-99 %] 92 % (12/27 1435) Weight:  [167 lb 8.8 oz (76 kg)] 167 lb 8.8 oz (76 kg) (12/27 0600)  Hemodynamic parameters for last 24 hours:   stable  Intake/Output from previous day: 12/26 0701 - 12/27 0700 In: 1835 [P.O.:840; I.V.:795; IV Piggyback:200] Out: 1610 [RUEAV:4098; Chest Tube:220] Intake/Output this shift: Total I/O In: 1416 [P.O.:1000; I.V.:266; IV Piggyback:150] Out: 1745 [Urine:1625; Chest Tube:120]  Exam Scattered rhonchi Large air leak Sinus rhythm Weeping skin incisions from steroid dermal atrophy  Lab Results:  Basename 08/20/12 0427 08/19/12 0500  WBC 11.5* 16.5*  HGB 10.1* 11.4*  HCT 30.0* 35.5*  PLT 196 213   BMET:  Basename 08/20/12 0427 08/19/12 0500  NA 135 139  K 3.7 4.5  CL 100 106  CO2 28 24  GLUCOSE 122* 106*  BUN 11 12  CREATININE 0.73 0.71  CALCIUM 7.9* 8.0*    PT/INR: No results found for this basename: LABPROT,INR in the last 72 hours ABG    Component Value Date/Time   PHART 7.516* 08/18/2012 0828   HCO3 23.1 08/18/2012 0828   TCO2 24 08/18/2012 0828   ACIDBASEDEF 0.8 08/18/2012 0447   O2SAT 99.0 08/18/2012 0828   CBG (last 3)   Basename 08/20/12 1132 08/20/12 0804  08/19/12 2150  GLUCAP 109* 106* 139*    Assessment/Plan: S/P Procedure(s) (LRB): VIDEO ASSISTED THORACOSCOPY (Right) RESECTION OF APICAL BLEB (Right) Continue current care, continue antibiotics, nutrition consult, low level of Pleur-evac suction due to large air leak, physical therapy consult, patient will remain in ICU secondary to pulmonary status   LOS: 10 days    VAN TRIGT III,PETER 08/20/2012

## 2012-08-21 ENCOUNTER — Inpatient Hospital Stay (HOSPITAL_COMMUNITY): Payer: Medicare Other

## 2012-08-21 LAB — BASIC METABOLIC PANEL
BUN: 12 mg/dL (ref 6–23)
Calcium: 7.9 mg/dL — ABNORMAL LOW (ref 8.4–10.5)
Creatinine, Ser: 0.63 mg/dL (ref 0.50–1.35)
GFR calc Af Amer: >90 mL/min (ref >90)
GFR calc non Af Amer: >90 mL/min (ref >90)
Glucose, Bld: 100 mg/dL — ABNORMAL HIGH (ref 70–99)

## 2012-08-21 LAB — GLUCOSE, CAPILLARY: Glucose-Capillary: 107 mg/dL — ABNORMAL HIGH (ref 70–99)

## 2012-08-21 MED ORDER — POTASSIUM CHLORIDE 10 MEQ/50ML IV SOLN
INTRAVENOUS | Status: AC
  Administered 2012-08-21: 10 meq via INTRAVENOUS
  Filled 2012-08-21: qty 150

## 2012-08-21 MED ORDER — POTASSIUM CHLORIDE 10 MEQ/50ML IV SOLN
10.0000 meq | INTRAVENOUS | Status: AC
  Administered 2012-08-21 (×3): 10 meq via INTRAVENOUS

## 2012-08-21 NOTE — Progress Notes (Signed)
4 Days Post-Op Procedure(s) (LRB): VIDEO ASSISTED THORACOSCOPY (Right) RESECTION OF APICAL BLEB (Right) Subjective: Some incisional discomfort  Objective: Vital signs in last 24 hours: Temp:  [97.5 F (36.4 C)-98.1 F (36.7 C)] 97.5 F (36.4 C) (12/28 0740) Pulse Rate:  [76-116] 97  (12/28 0800) Cardiac Rhythm:  [-] Normal sinus rhythm (12/28 0800) Resp:  [15-25] 19  (12/28 0800) BP: (104-148)/(57-89) 121/65 mmHg (12/28 0800) SpO2:  [85 %-100 %] 95 % (12/28 0800) Weight:  [164 lb 0.4 oz (74.4 kg)] 164 lb 0.4 oz (74.4 kg) (12/28 0500)  Hemodynamic parameters for last 24 hours:    Intake/Output from previous day: 12/27 0701 - 12/28 0700 In: 2966 [P.O.:1550; I.V.:1116; IV Piggyback:300] Out: 5060 [Urine:4750; Chest Tube:310] Intake/Output this shift: Total I/O In: 50 [I.V.:50] Out: 260 [Urine:200; Chest Tube:60]  General appearance: alert and no distress Neurologic: intact Heart: tachy, regular Lungs: bronchophony right moderately large air leak  Lab Results:  Basename 08/20/12 0427 08/19/12 0500  WBC 11.5* 16.5*  HGB 10.1* 11.4*  HCT 30.0* 35.5*  PLT 196 213   BMET:  Basename 08/20/12 0427 08/19/12 0500  NA 135 139  K 3.7 4.5  CL 100 106  CO2 28 24  GLUCOSE 122* 106*  BUN 11 12  CREATININE 0.73 0.71  CALCIUM 7.9* 8.0*    PT/INR: No results found for this basename: LABPROT,INR in the last 72 hours ABG    Component Value Date/Time   PHART 7.516* 08/18/2012 0828   HCO3 23.1 08/18/2012 0828   TCO2 24 08/18/2012 0828   ACIDBASEDEF 0.8 08/18/2012 0447   O2SAT 99.0 08/18/2012 0828   CBG (last 3)   Basename 08/21/12 0739 08/20/12 2201 08/20/12 1945  GLUCAP 98 110* 128*    Assessment/Plan: S/P Procedure(s) (LRB): VIDEO ASSISTED THORACOSCOPY (Right) RESECTION OF APICAL BLEB (Right) POD # 3 blebectomy CV- stable  RESP- still has relatively large air leak- CXR looks a little better this AM- continue CT to suction  RENAL- diuresing well, check  K  CBG well controlled  Mobilize   LOS: 11 days    Dio Giller C 08/21/2012

## 2012-08-21 NOTE — Progress Notes (Signed)
PULMONARY  / CRITICAL CARE MEDICINE  Name: Mark [name] MRN: [id number] DOB: Oct 13, 1940    LOS: 11  REFERRING PROVIDER:  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  Found to have moderate R pneumothorax.  CT placed and admitted for further care.  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   LINES / TUBES: R CT  12/17 >>>12/21 Repeat R CT 12/22>>> OETT 12/24>>> R [place] dual lumen12/24>>>   CULTURES: MRSA screen 12/18 > negative 12/24 VATS Bx>>>   ANTIBIOTICS: 12/16 Doxycycline (AECOPD) > 12/23  12/24 Zinacef  SIGNIFICANT EVENTS:  12/16 - started on doxy for worsening SOB, decreased sats 12/17 - Acute office visit for above, found to have R basilar 20% PTX 12/19 - residual small apical ptx, no airleak 12/20 - CT on water seal??  Patient took off suction to go to bathroom, lung with tiny residual ptx 12/21 - CT removed 12/22 - repeat 40% ptx, ct replaced 12/24 - R  VATS with resection / stapling of blebs, mechanical pleurodesis & CT placement.  Returned to ICU on vent 12/26 -20% apical PNTx, large air leak, on suction; Tachycardia      INTERVAL HISTORY:  Post VATS, awake  on vent  VITAL SIGNS: Temp:  [97.5 F (36.4 C)-98.1 F (36.7 C)] 97.5 F (36.4 C) (12/28 0740) Pulse Rate:  [76-116] 97  (12/28 0800) Resp:  [15-25] 19  (12/28 0800) BP: (104-148)/(59-89) 121/65 mmHg (12/28 0800) SpO2:  [85 %-100 %] 95 % (12/28 0800) Weight:  [74.4 kg (164 lb 0.4 oz)] 74.4 kg (164 lb 0.4 oz) (12/28 0500) 4 liters   PHYSICAL EXAMINATION: General:  Elderly male, in NAD on vent Neuro: awake and appropirate HEENT:  WNL Cardiovascular:  RRR  Lungs: exp wheezes, occ rhonchi,  bronchophony in right,  bubbling on rt, air leak ++ Abdomen:  NT, soft / active bs Ext: No edema, no cyanosis   Lab 08/21/12 0830 08/20/12 0427 08/19/12 0500  NA 135 135 139  K 3.5 3.7 4.5  CL 99 100 106  CO2 27 28 24   BUN 12 11 12   CREATININE 0.63 0.73 0.71  GLUCOSE 100* 122* 106*    Lab 08/20/12 0427 08/19/12 0500 08/18/12 0420  HGB 10.1* 11.4* 12.2*  HCT 30.0* 35.5* 36.8*  WBC 11.5* 16.5* 13.9*  PLT 196 213 213   CXR:  12/28 >> expanded rt lung - w/ small persistent right apical PTX and no sig change in patchy right sided airspace disease   IMPRESSION:  Spontaneous pneumothorax Initially with spontaneous PTX on admit, resolved and CT removed with re-occurrence.  Required replacement CT on 12/22.  Underwent VATS on 12/24 with bleb stapling and CT placement.  Still has large airleak 12/28 COPD with chronic bronchitis-oxygen dependent - completed  doxycycline for AECOPD 12/23.  Chronic pred @ 20 mg/day.  Currently without active bronchospasm;   PLAN: - suction per CVTS on chest tube - Prednisone 20 mg  - no change in BD rx  - f/u cxr  Atrial Fibrillation  Hx of Afib - has been in sinus rhythm during this admit on rhythmol & verapamil (ER).  Mild Hypotension post op 12/24 (SBP 90's). Now resolved  PLAN: -  verapamil  -propafenone - ASA  Hyperglycemia In setting of chronic steroid usage.   PLAN: -SSI  Pain In setting of post op VATS PLAN: -prn analgesia   Reviewed above, examined pt, and agree with assessment/plan.  Still has airleak, but PTX small on CXR.  Coralyn Helling, MD [facility] Pulmonary/Critical Care 08/21/2012, 12:05 PM Pager:  [phone] After 3pm call: [phone]

## 2012-08-21 NOTE — Progress Notes (Signed)
Ambulating in hallway  Air leak unchanged, down to 3L [place]  BP 124/69  Pulse 100  Temp 98.3 F (36.8 C) (Oral)  Resp 22  Ht 5\' 11"  (1.803 m)  Wt 164 lb 0.4 oz (74.4 kg)  BMI 22.88 kg/m2  SpO2 93%   Intake/Output Summary (Last 24 hours) at 08/21/12 1856 Last data filed at 08/21/12 1600  Gross per 24 hour  Intake   1910 ml  Output   3825 ml  Net  -1915 ml    Stable day

## 2012-08-22 ENCOUNTER — Inpatient Hospital Stay (HOSPITAL_COMMUNITY): Payer: Medicare Other

## 2012-08-22 LAB — BASIC METABOLIC PANEL
BUN: 13 mg/dL (ref 6–23)
CO2: 30 mEq/L (ref 19–32)
Chloride: 98 mEq/L (ref 96–112)
GFR calc Af Amer: >90 mL/min (ref >90)
Potassium: 4 mEq/L (ref 3.5–5.1)

## 2012-08-22 LAB — CBC
HCT: 29 % — ABNORMAL LOW (ref 39.0–52.0)
Hemoglobin: 9.3 g/dL — ABNORMAL LOW (ref 13.0–17.0)
MCHC: 32.1 g/dL (ref 30.0–36.0)
MCV: 96.7 fL (ref 78.0–100.0)

## 2012-08-22 LAB — GLUCOSE, CAPILLARY
Glucose-Capillary: 94 mg/dL (ref 70–99)
Glucose-Capillary: 127 mg/dL — ABNORMAL HIGH (ref 70–99)

## 2012-08-22 MED ORDER — SODIUM CHLORIDE 0.9 % IJ SOLN
10.0000 mL | INTRAMUSCULAR | Status: DC | PRN
  Administered 2012-08-26 – 2012-08-27 (×3): 20 mL
  Administered 2012-08-28 – 2012-08-30 (×5): 10 mL
  Filled 2012-08-22: qty 10
  Filled 2012-08-22: qty 30

## 2012-08-22 MED ORDER — POLYETHYLENE GLYCOL 3350 17 G PO PACK
17.0000 g | PACK | Freq: Once | ORAL | Status: AC
  Administered 2012-08-22: 17 g via ORAL
  Filled 2012-08-22: qty 1

## 2012-08-22 MED ORDER — SODIUM CHLORIDE 0.9 % IJ SOLN
10.0000 mL | Freq: Two times a day (BID) | INTRAMUSCULAR | Status: DC
  Administered 2012-08-24 – 2012-08-25 (×3): 10 mL
  Administered 2012-08-26: 20 mL
  Administered 2012-08-28 – 2012-08-30 (×3): 10 mL
  Filled 2012-08-22: qty 10
  Filled 2012-08-22: qty 20
  Filled 2012-08-22: qty 10

## 2012-08-22 NOTE — Progress Notes (Signed)
Peripherally Inserted Central Catheter/Midline Placement  The IV Nurse has discussed with the patient and/or persons authorized to consent for the patient, the purpose of this procedure and the potential benefits and risks involved with this procedure.  The benefits include less needle sticks, lab draws from the catheter and patient may be discharged home with the catheter.  Risks include, but not limited to, infection, bleeding, blood clot (thrombus formation), and puncture of an artery; nerve damage and irregular heat beat.  Alternatives to this procedure were also discussed.  PICC/Midline Placement Documentation  PICC / Midline Double Lumen 08/22/12 PICC Right Basilic (Active)       Stacie Glaze Horton 08/22/2012, 8:56 AM

## 2012-08-22 NOTE — Progress Notes (Signed)
PULMONARY  / CRITICAL CARE MEDICINE  Name: Mark [name] MRN: [id number] DOB: 1940-12-20    LOS: 12  REFERRING PROVIDER:  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  Found to have moderate R pneumothorax.  CT placed and admitted for further care.  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   LINES / TUBES: R CT  12/17 >>>12/21 Repeat R CT 12/22>>> OETT 12/24>>> R [place] dual lumen12/24>>>   CULTURES: MRSA screen 12/18 > negative 12/24 VATS Bx>>>   ANTIBIOTICS: 12/16 Doxycycline (AECOPD) > 12/23  12/24 Zinacef  SIGNIFICANT EVENTS:  12/16 - started on doxy for worsening SOB, decreased sats 12/17 - Acute office visit for above, found to have R basilar 20% PTX 12/19 - residual small apical ptx, no airleak 12/20 - CT on water seal??  Patient took off suction to go to bathroom, lung with tiny residual ptx 12/21 - CT removed 12/22 - repeat 40% ptx, ct replaced 12/24 - R  VATS with resection / stapling of blebs, mechanical pleurodesis & CT placement.  Returned to ICU on vent 12/26 -20% apical PNTx, large air leak, on suction; Tachycardia      INTERVAL HISTORY:  Resting comfortably.  VITAL SIGNS: Temp:  [97.6 F (36.4 C)-98.3 F (36.8 C)] 97.7 F (36.5 C) (12/29 0752) Pulse Rate:  [78-111] 88  (12/29 0800) Resp:  [16-24] 18  (12/29 0800) BP: (102-145)/(54-85) 120/67 mmHg (12/29 0700) SpO2:  [88 %-100 %] 100 % (12/29 0800) FiO2 (%):  [94 %] 94 % (12/29 0120) Weight:  [160 lb 15 oz (73 kg)] 160 lb 15 oz (73 kg) (12/29 0600) 4 liters   PHYSICAL EXAMINATION: General:  Elderly male, in NAD on vent Neuro: awake and appropirate HEENT:  WNL Cardiovascular:  RRR  Lungs:  exp wheezes, occ rhonchi, bronchophony in right,  bubbling on rt, air leak ++ Abdomen:  NT, soft / active bs Ext: No edema, no cyanosis   Lab 08/22/12 0500 08/21/12 0830 08/20/12 0427  NA 135 135 135  K 4.0 3.5 3.7  CL 98 99 100  CO2 30 27 28   BUN 13 12 11   CREATININE 0.70 0.63 0.73  GLUCOSE 98 100* 122*    Lab 08/22/12 0500 08/20/12 0427 08/19/12 0500  HGB 9.3* 10.1* 11.4*  HCT 29.0* 30.0* 35.5*  WBC 10.0 11.5* 16.5*  PLT 249 196 213   CXR:  12/28 >> expanded rt lung - w/ small persistent right apical PTX and no sig change in patchy right sided airspace disease   IMPRESSION:  Spontaneous pneumothorax Initially with spontaneous PTX on admit, resolved and CT removed with re-occurrence.  Required replacement CT on 12/22.  Underwent VATS on 12/24 with bleb stapling and CT placement.  Still has large airleak 12/28 COPD with chronic bronchitis-oxygen dependent - completed  doxycycline for AECOPD 12/23.  Chronic pred @ 20 mg/day.  Currently without active bronchospasm;   PLAN: - suction per CVTS on chest tube - Prednisone 20 mg  - no change in BD rx  - f/u cxr  Atrial Fibrillation  Hx of Afib - has been in sinus rhythm during this admit on rhythmol & verapamil (ER).  Mild Hypotension post op 12/24 (SBP  90's). Now resolved  PLAN: -verapamil  -propafenone - ASA  Hyperglycemia In setting of chronic steroid usage.   PLAN: -SSI  Pain In setting of post op VATS PLAN: -prn analgesia   Reviewed above, examined pt, and agree with assessment/plan.  Still has airleak.  May need valve >> defer to TCTS.  Coralyn Helling, MD [facility] Pulmonary/Critical Care 08/22/2012, 11:24 AM Pager:  [phone] After 3pm call: [phone]

## 2012-08-22 NOTE — Progress Notes (Signed)
PULMONARY  / CRITICAL CARE MEDICINE  Name: TAITE SCHOEPPNER MRN: [id number] DOB: November 26, 1940    LOS: 12  REFERRING PROVIDER:  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  Found to have moderate R pneumothorax.  CT placed and admitted for further care.  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   LINES / TUBES: R CT  12/17 >>>12/21 Repeat R CT 12/22>>> OETT 12/24>>> R [place] dual lumen12/24>>>   CULTURES: MRSA screen 12/18 > negative 12/24 VATS Bx>>>   ANTIBIOTICS: 12/16 Doxycycline (AECOPD) > 12/23  12/24 Zinacef  SIGNIFICANT EVENTS:  12/16 - started on doxy for worsening SOB, decreased sats 12/17 - Acute office visit for above, found to have R basilar 20% PTX 12/19 - residual small apical ptx, no airleak 12/20 - CT on water seal??  Patient took off suction to go to bathroom, lung with tiny residual ptx 12/21 - CT removed 12/22 - repeat 40% ptx, ct replaced 12/24 - R  VATS with resection / stapling of blebs, mechanical pleurodesis & CT placement.  Returned to ICU on vent 12/26 -20% apical PNTx, large air leak, on suction; Tachycardia      INTERVAL HISTORY:  Post VATS, awake  on vent  VITAL SIGNS: Temp:  [97.6 F (36.4 C)-98.3 F (36.8 C)] 97.7 F (36.5 C) (12/29 0752) Pulse Rate:  [78-117] 89  (12/29 0700) Resp:  [16-25] 19  (12/29 0700) BP: (102-145)/(54-85) 120/67 mmHg (12/29 0700) SpO2:  [88 %-100 %] 95 % (12/29 0752) FiO2 (%):  [94 %] 94 % (12/29 0120) Weight:  [73 kg (160 lb 15 oz)] 73 kg (160 lb 15 oz) (12/29 0600) 4 liters   PHYSICAL EXAMINATION: General:  Elderly male, in NAD on vent Neuro: awake and appropirate HEENT:  WNL Cardiovascular:  RRR    Lungs: exp wheezes, occ rhonchi, bronchophony in right,  bubbling on rt, air leak ++ Abdomen:  NT, soft / active bs Ext: No edema, no cyanosis   Lab 08/22/12 0500 08/21/12 0830 08/20/12 0427  NA 135 135 135  K 4.0 3.5 3.7  CL 98 99 100  CO2 30 27 28   BUN 13 12 11   CREATININE 0.70 0.63 0.73  GLUCOSE 98 100* 122*    Lab 08/22/12 0500 08/20/12 0427 08/19/12 0500  HGB 9.3* 10.1* 11.4*  HCT 29.0* 30.0* 35.5*  WBC 10.0 11.5* 16.5*  PLT 249 196 213   CXR:  12/29 >> expanded rt lung - w/ only tiny residual apical PTX and no sig change in patchy right sided airspace disease   IMPRESSION:  Spontaneous pneumothorax Initially with spontaneous PTX on admit, resolved and CT removed with re-occurrence.  Required replacement CT on 12/22.  Underwent VATS on 12/24 with bleb stapling and CT placement.  Still has large airleak 12/28 COPD with chronic bronchitis-oxygen dependent - completed  doxycycline for AECOPD 12/23.  Chronic pred @ 20 mg/day.  Currently without active bronchospasm;   PLAN: - suction per CVTS on chest tube - Prednisone 20 mg  - no change in BD rx  - f/u cxr  Atrial Fibrillation  Hx of Afib - has been in sinus rhythm during this admit on rhythmol & verapamil (ER).  Mild  Hypotension post op 12/24 (SBP 90's). Now resolved  PLAN: -verapamil  -propafenone - ASA  Hyperglycemia In setting of chronic steroid usage.   PLAN: -SSI  Pain In setting of post op VATS PLAN: -prn analgesia    08/22/2012, 8:54 AM Pager:  [phone] After 3pm call: [phone]

## 2012-08-23 ENCOUNTER — Inpatient Hospital Stay (HOSPITAL_COMMUNITY): Payer: Medicare Other

## 2012-08-23 ENCOUNTER — Encounter (HOSPITAL_COMMUNITY): Payer: Self-pay | Admitting: Cardiothoracic Surgery

## 2012-08-23 DIAGNOSIS — J9600 Acute respiratory failure, unspecified whether with hypoxia or hypercapnia: Secondary | ICD-10-CM

## 2012-08-23 LAB — CBC
HCT: 33.3 % — ABNORMAL LOW (ref 39.0–52.0)
MCHC: 32.7 g/dL (ref 30.0–36.0)
MCV: 96.5 fL (ref 78.0–100.0)
Platelets: 298 10*3/uL (ref 150–400)
RDW: 14.7 % (ref 11.5–15.5)
WBC: 12.4 10*3/uL — ABNORMAL HIGH (ref 4.0–10.5)

## 2012-08-23 LAB — POCT I-STAT 7, (LYTES, BLD GAS, ICA,H+H)
Acid-Base Excess: 5 mmol/L — ABNORMAL HIGH (ref 0.0–2.0)
Calcium, Ion: 1.08 mmol/L — ABNORMAL LOW (ref 1.13–1.30)
O2 Saturation: 100 %
Patient temperature: 35.3
Potassium: 3.6 mEq/L (ref 3.5–5.1)
Sodium: 138 mEq/L (ref 135–145)
TCO2: 30 mmol/L (ref 0–100)
pCO2 arterial: 37.5 mmHg (ref 35.0–45.0)

## 2012-08-23 LAB — BASIC METABOLIC PANEL
BUN: 15 mg/dL (ref 6–23)
Calcium: 8.9 mg/dL (ref 8.4–10.5)
Chloride: 92 mEq/L — ABNORMAL LOW (ref 96–112)
Creatinine, Ser: 0.74 mg/dL (ref 0.50–1.35)
GFR calc Af Amer: >90 mL/min (ref >90)
GFR calc non Af Amer: >90 mL/min (ref >90)

## 2012-08-23 NOTE — Progress Notes (Signed)
PULMONARY  / CRITICAL CARE MEDICINE  Name: Mark [name] MRN: [id number] DOB: 09/29/1940    LOS: 13  REFERRING PROVIDER:  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  Found to have moderate R pneumothorax.  CT placed and admitted for further care.  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   LINES / TUBES: R CT  12/17 >>>12/21 Repeat R CT 12/22>>> OETT 12/24>>>12/25 R [place] dual lumen12/24>>>   CULTURES: MRSA screen 12/18 > negative 12/24 VATS Bx>>>   ANTIBIOTICS: 12/16 Doxycycline (AECOPD) > 12/23  12/24 Zinacef  SIGNIFICANT EVENTS:  12/16 - started on doxy for worsening SOB, decreased sats 12/17 - Acute office visit for above, found to have R basilar 20% PTX 12/19 - residual small apical ptx, no airleak 12/20 - CT on water seal??  Patient took off suction to go to bathroom, lung with tiny residual ptx 12/21 - CT removed 12/22 - repeat 40% ptx, ct replaced 12/24 - R  VATS with resection / stapling of blebs, mechanical pleurodesis & CT placement.  Returned to ICU on vent 12/26 -20% apical PNTx, large air leak, on suction; Tachycardia   12/30 CT to water seal, cont air leak    INTERVAL HISTORY:  No c/o.  Cont to have air leak, CT now to water seal per CVTS.   VITAL SIGNS: Temp:  [97.5 F (36.4 C)-98.3 F (36.8 C)] 98.3 F (36.8 C) (12/30 0800) Pulse Rate:  [84-107] 84  (12/30 0328) Resp:  [16-18] 16  (12/30 0328) BP: (107-117)/(58-70) 107/58 mmHg (12/30 0800) SpO2:  [92 %-100 %] 94 % (12/30 0821) 4 liters   PHYSICAL EXAMINATION: General:  Elderly male, in NAD Neuro: awake and appropriate, MAE  HEENT:  WNL Cardiovascular:  RRR  Lungs: diminished R, few  scattered ronchi, otherwise fairly clear, R CT with ++4/7 air leak, 7/7 with cough  Abdomen:  NT, soft / active bs Ext: No edema, no cyanosis   Lab 08/23/12 0353 08/22/12 0500 08/21/12 0830  NA 133* 135 135  K 3.5 4.0 3.5  CL 92* 98 99  CO2 31 30 27   BUN 15 13 12   CREATININE 0.74 0.70 0.63  GLUCOSE 110* 98 100*    Lab 08/23/12 0353 08/22/12 0500 08/20/12 0427  HGB 10.9* 9.3* 10.1*  HCT 33.3* 29.0* 30.0*  WBC 12.4* 10.0 11.5*  PLT 298 249 196   CXR:  12/30 >> expanded rt lung - w/ small persistent right apical PTX and no sig change in patchy right sided airspace disease   IMPRESSION:  Spontaneous pneumothorax Initially with spontaneous PTX on admit, resolved and CT removed with re-occurrence.  Required replacement CT on 12/22.  Underwent VATS on 12/24 with bleb stapling and CT placement.  Still has large airleak 12/28 COPD with chronic bronchitis-oxygen dependent - completed  doxycycline for AECOPD 12/23.  Chronic pred @ 20 mg/day.  Currently without active bronchospasm;   PLAN: - CT to water seal per CVTS but still has Significant air leak  - Cont Prednisone 20 mg -- maint dose  - no change in BD rx  - f/u cxr  Atrial Fibrillation  Hx of Afib - has been in sinus rhythm during this admit  on rhythmol & verapamil (ER).  Mild Hypotension post op 12/24 (SBP 90's). Now resolved  PLAN: -cont verapamil, rythmol, lopressor, lasix   Hyperglycemia In setting of chronic steroid usage.   PLAN: -SSI  Hyponatremia - mild  PLAN:  F/u chem   Pain In setting of post op VATS/ Chest tubes  PLAN: -prn analgesia    WHITEHEART,KATHRYN, NP 08/23/2012  11:38 AM Pager: [phone] or [phone]  *Care during the described time interval was provided by me and/or other providers on the critical care team. I have reviewed this patient's available data, including medical history, events of note, physical examination and test results as part of my evaluation.   Patient seen and  examined, agree with above note.  I dictated the care and orders written for this patient under my direction.  Koren Bound, M.D. [phone]

## 2012-08-23 NOTE — Progress Notes (Signed)
Physical Therapy Treatment Patient Details Name: Mark [name] MRN: [id number] DOB: 23-Oct-1940 Today's Date: 08/23/2012 Time: 4098-1191 PT Time Calculation (min): 20 min  PT Assessment / Plan / Recommendation Comments on Treatment Session  Pt needed decrease assistance with all mobility however pt continues to need intermittent standing rest breaks with ambulation.  Increased supplemental O2 to 4L during ambulation.  Pt maintained between 87% to 93% Sa02 on 4L.  Pt continues to show improvement and will continue to follow.    Follow Up Recommendations  Home health PT;Supervision/Assistance - 24 hour     Does the patient have the potential to tolerate intense rehabilitation     Barriers to Discharge        Equipment Recommendations  None recommended by PT    Recommendations for Other Services    Frequency Min 3X/week   Plan Discharge plan remains appropriate;Frequency remains appropriate    Precautions / Restrictions Precautions Precautions: Fall Precaution Comments: right chest tube   Pertinent Vitals/Pain C/o right flank pain at chest tube insertion but does not rate    Mobility  Bed Mobility Bed Mobility: Supine to Sit Supine to Sit: 4: Min guard Details for Bed Mobility Assistance: Minguard for safety with lines Transfers Transfers: Sit to Stand;Stand to Sit Sit to Stand: 4: Min guard;From bed Stand to Sit: 4: Min guard;To chair/3-in-1 Details for Transfer Assistance: minguard for safety with cues for hand placement Ambulation/Gait Ambulation/Gait Assistance: 4: Min guard Ambulation Distance (Feet): 250 Feet Assistive device:  (pushed wheelchair for equipment) Ambulation/Gait Assistance Details: MInguard for safety with cues for hand placement and upright posture.  Pt needed intermittent standing rest breaks for slow deep breathing. Gait Pattern: Step-through pattern;Decreased stance time - left Stairs: No Wheelchair Mobility Wheelchair Mobility: No    Exercises      PT Diagnosis:    PT Problem List:   PT Treatment Interventions:     PT Goals Acute Rehab PT Goals PT Goal Formulation: With patient Time For Goal Achievement: 08/27/12 Potential to Achieve Goals: Good Pt will go Supine/Side to Sit: Independently PT Goal: Supine/Side to Sit - Progress: Progressing toward goal Pt will go Sit to Stand: Independently PT Goal: Sit to Stand - Progress: Progressing toward goal Pt will Ambulate: 51 - 150 feet;with least restrictive assistive device;with modified independence PT Goal: Ambulate - Progress: Progressing toward goal  Visit Information  Last PT Received On: 08/23/12 Assistance Needed: +1    Subjective Data  Subjective: "I wanted to get up and get moving." Patient Stated Goal: To go home   Cognition  Overall Cognitive Status: Appears within functional limits for tasks assessed/performed Arousal/Alertness: Awake/alert Orientation Level: Appears intact for tasks assessed Behavior During Session: [facility] for tasks performed    Balance  Static Standing Balance Static Standing - Balance Support: Bilateral upper extremity supported;During functional activity Static Standing - Level of Assistance: 5: Stand by assistance  End of Session PT - End of Session Equipment Utilized During Treatment: Gait belt;Oxygen (4L) Activity Tolerance: Patient tolerated treatment well Patient left: in chair;with call bell/phone within reach;with family/visitor present Nurse Communication: Mobility status   GP     Mark [name] 08/23/2012, 2:53 PM

## 2012-08-23 NOTE — Progress Notes (Addendum)
                    [address]            [place] [address]          [phone]     6 Days Post-Op Procedure(s) (LRB): VIDEO ASSISTED THORACOSCOPY (Right) RESECTION OF APICAL BLEB (Right)  Subjective: Feels well, breathing stable.     Objective: Vital signs in last 24 hours: Patient Vitals for the past 24 hrs:  BP Temp Temp src Pulse Resp SpO2  08/23/12 0713 - 98 F (36.7 C) Oral - - -  08/23/12 0328 116/69 mmHg 98 F (36.7 C) Oral 84  16  95 %  08/23/12 0304 - - - - - 92 %  08/23/12 0022 111/65 mmHg 97.8 F (36.6 C) Oral 86  18  95 %  08/22/12 2109 - - - - - 97 %  08/22/12 2108 - - - - - 97 %  08/22/12 2042 117/70 mmHg 97.9 F (36.6 C) Oral 107  16  95 %  08/22/12 1545 - 97.5 F (36.4 C) Oral - - -  08/22/12 1432 - - - - - 100 %  08/22/12 1300 117/61 mmHg - - - - -  08/22/12 1128 - 97.9 F (36.6 C) Oral - - -  08/22/12 0800 - - - 88  18  100 %   Current Weight  08/22/12 160 lb 15 oz (73 kg)     Intake/Output from previous day: 12/29 0701 - 12/30 0700 In: 990 [P.O.:720; I.V.:220; IV Piggyback:50] Out: 2100 [Urine:2025; Chest Tube:75]    PHYSICAL EXAM:  Heart: RRR Lungs:few exp wheezes Wound: Clean and dry Chest tube: Large air leak    Lab Results: CBC: Basename 08/23/12 0353 08/22/12 0500  WBC 12.4* 10.0  HGB 10.9* 9.3*  HCT 33.3* 29.0*  PLT 298 249   BMET:  Basename 08/23/12 0353 08/22/12 0500  NA 133* 135  K 3.5 4.0  CL 92* 98  CO2 31 30  GLUCOSE 110* 98  BUN 15 13  CREATININE 0.74 0.70  CALCIUM 8.9 8.4    PT/INR: No results found for this basename: LABPROT,INR in the last 72 hours  CXR: stable R apical ptx/space  Assessment/Plan: S/P Procedure(s) (LRB): VIDEO ASSISTED THORACOSCOPY (Right) RESECTION OF APICAL BLEB (Right) Persistent air leak- continue CT to suction. Other issues per pulm/CCM.   LOS: 13 days    COLLINS,GINA H 08/23/2012    Still has a pretty big air leak. Lung fully expanded on  suction- will try on water seal and see if lung will stay up.

## 2012-08-24 ENCOUNTER — Inpatient Hospital Stay (HOSPITAL_COMMUNITY): Payer: Medicare Other

## 2012-08-24 LAB — BASIC METABOLIC PANEL
CO2: 32 mEq/L (ref 19–32)
Calcium: 8.8 mg/dL (ref 8.4–10.5)
Glucose, Bld: 109 mg/dL — ABNORMAL HIGH (ref 70–99)
Potassium: 3.5 mEq/L (ref 3.5–5.1)
Sodium: 136 mEq/L (ref 135–145)

## 2012-08-24 MED ORDER — POTASSIUM CHLORIDE CRYS ER 20 MEQ PO TBCR
40.0000 meq | EXTENDED_RELEASE_TABLET | Freq: Three times a day (TID) | ORAL | Status: AC
  Administered 2012-08-24 (×2): 40 meq via ORAL
  Filled 2012-08-24: qty 2

## 2012-08-24 NOTE — Progress Notes (Addendum)
[address]            [place] [address]          [phone]     7 Days Post-Op  Procedure(s) (LRB): VIDEO ASSISTED THORACOSCOPY (Right) RESECTION OF APICAL BLEB (Right) Subjective: Says he feels better  Objective  Telemetry sinus with PVC's  Temp:  [97.7 F (36.5 C)-98.1 F (36.7 C)] 97.9 F (36.6 C) (12/31 0300) Pulse Rate:  [77-103] 77  (12/31 0300) Resp:  [17-20] 17  (12/31 0300) BP: (97-120)/(62-71) 97/70 mmHg (12/31 0300) SpO2:  [94 %-98 %] 97 % (12/31 0300)   Intake/Output Summary (Last 24 hours) at 08/24/12 0839 Last data filed at 08/24/12 0601  Gross per 24 hour  Intake   1163 ml  Output     50 ml  Net   1113 ml       General appearance: alert, cooperative and mild distress Heart: regular rate and rhythm Lungs: coarse BS Wound: incisions healing ok  Lab Results:  Basename 08/24/12 0326 08/23/12 0353  NA 136 133*  K 3.5 3.5  CL 98 92*  CO2 32 31  GLUCOSE 109* 110*  BUN 18 15  CREATININE 0.79 0.74  CALCIUM 8.8 8.9  MG -- --  PHOS -- --   No results found for this basename: AST:2,ALT:2,ALKPHOS:2,BILITOT:2,PROT:2,ALBUMIN:2 in the last 72 hours No results found for this basename: LIPASE:2,AMYLASE:2 in the last 72 hours  Basename 08/23/12 0353 08/22/12 0500  WBC 12.4* 10.0  NEUTROABS -- --  HGB 10.9* 9.3*  HCT 33.3* 29.0*  MCV 96.5 96.7  PLT 298 249   No results found for this basename: CKTOTAL:4,CKMB:4,TROPONINI:4 in the last 72 hours No components found with this basename: POCBNP:3 No results found for this basename: DDIMER in the last 72 hours No results found for this basename: HGBA1C in the last 72 hours No results found for this basename: CHOL,HDL,LDLCALC,TRIG,CHOLHDL in the last 72 hours No results found for this basename: TSH,T4TOTAL,FREET3,T3FREE,THYROIDAB in the last 72 hours No results found for this basename: VITAMINB12,FOLATE,FERRITIN,TIBC,IRON,RETICCTPCT in the last 72  hours  Medications: Scheduled    . aspirin EC  81 mg Oral Daily  . bisacodyl  10 mg Oral QHS  . budesonide-formoterol  2 puff Inhalation BID  . cefTAZidime (FORTAZ)  IV  1 g Intravenous Q8H  . dextromethorphan-guaiFENesin  1 tablet Oral BID  . enoxaparin  40 mg Subcutaneous Q24H  . feeding supplement  1 Container Oral TID WC  . furosemide  40 mg Oral Daily  . levalbuterol  0.63 mg Nebulization Q6H  . pantoprazole  40 mg Oral Q1200  . predniSONE  20 mg Oral QAC breakfast  . propafenone  225 mg Oral BID  . sodium chloride  10 mL Intravenous Q12H  . sodium chloride  10-40 mL Intracatheter Q12H  . sodium chloride  3 mL Intravenous Q12H  . verapamil  240 mg Oral QHS     Radiology/Studies:  Dg Chest Port 1 View  08/24/2012  *RADIOLOGY REPORT*  Clinical Data: Right pneumothorax.  PORTABLE CHEST - 1 VIEW  Comparison: Chest x-ray 08/23/2012.  Findings: Two right-sided chest tubes remain in position with tips and side ports projecting over the right hemithorax.  Previously noted right-sided pneumothorax is slightly smaller than the prior examination occupying approximately 10 - 15% of the right hemithorax volume on today's study. There is a right upper extremity  PICC with tip terminating in the right atrium. Patchy multifocal interstitial opacities are noted throughout the left base and throughout the right mid and lower lung.  In the right midlung there is a more focal mass-like opacity adjacent to a set of sutures, which likely represents an area of some mild postoperative hemorrhage/edema.  Probable trace left pleural effusion.  No definite evidence of pulmonary edema.  Heart size is normal.  Mediastinal contours are unremarkable.  IMPRESSION: 1.  Support apparatus, as above. 2.  Slight decrease in size of the right pneumothorax compared to yesterday's examination.  Radiographic appearance of the chest is otherwise very similar, as detailed above.   Original Report Authenticated By: Trudie Reed, M.D.    Dg Chest Port 1 View  08/23/2012  *RADIOLOGY REPORT*  Clinical Data: Pneumothorax  PORTABLE CHEST - 1 VIEW  Comparison: Earlier the same day  Findings: 1248 hours.  The two right-sided chest tubes remain in place. Slight increase and right pneumothorax.  Right PICC line remains in place.  Left lung is clear. Telemetry leads overlie the chest.  IMPRESSION: Slight interval increase in right sided pneumothorax.   Original Report Authenticated By: Kennith Center, M.D.    Dg Chest Port 1 View  08/23/2012  *RADIOLOGY REPORT*  Clinical Data: Pneumothorax  PORTABLE CHEST - 1 VIEW  Comparison: Multiple recent previous exams.  Findings: 0611 hours.  The two right-sided chest tubes remain in place and are stable in position.  Residual right hydropneumothorax without substantial change.  Right PICC line tip projects at the distal SVC level.  No change in the flame shaped opacity involving the right midlung.  There is some atelectasis as scarring at the left base. Telemetry leads overlie the chest.  IMPRESSION: No substantial interval change exam.   Original Report Authenticated By: Kennith Center, M.D.    Dg Chest Port 1 View  08/22/2012  *RADIOLOGY REPORT*  Clinical Data: Follow up pneumothorax  PORTABLE CHEST - 1 VIEW  Comparison: 08/22/2012  Findings: There is a right arm PICC line with the tip in the right atrium.  Two right-sided chest tubes are present.  Right-sided hydropneumothorax is stable from previous exam. Airspace opacities within airspace opacification within the right upper lobe is stable compared with previous exam There is mild atelectasis in the left base.  IMPRESSION:  1.  No significant change and right-sided hydro pneumonia thorax. 2.  Persistent right upper lobe airspace opacity.   Original Report Authenticated By: Signa Kell, M.D.     INR: Will add last result for INR, ABG once components are confirmed Will add last 4 CBG results once components are confirmed   Chest  tube with large air leak, 50 cc drainage recorded/24 hours  Assessment/Plan: S/P Procedure(s) (LRB): VIDEO ASSISTED THORACOSCOPY (Right) RESECTION OF APICAL BLEB (Right)  1 CXR rel stable on current rx /tx, clinically feels a little better 2 medical management as per the pulmonologists   LOS: 14 days    GOLD,[name] 12/31/20138:39 AM    Patient seen and examined. Agree with above. Most of the leak seems to be coming from the larger posterior tube. Will keep both for now.

## 2012-08-24 NOTE — Progress Notes (Signed)
Utilization review completed.  

## 2012-08-24 NOTE — Progress Notes (Signed)
PULMONARY  / CRITICAL CARE MEDICINE  Name: TREASURE INGRUM MRN: [id number] DOB: 10/06/1940    LOS: 14  REFERRING PROVIDER:  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  Found to have moderate R pneumothorax.  CT placed and admitted for further care.  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   LINES / TUBES: R CT  12/17 >>>12/21 Repeat R CT 12/22>>> OETT 12/24>>>12/25 R [place] dual lumen12/24>>>   CULTURES: MRSA screen 12/18 > negative 12/24 VATS Bx>>>   ANTIBIOTICS: 12/16 Doxycycline (AECOPD) > 12/23  12/24 Zinacef  SIGNIFICANT EVENTS:  12/16 - started on doxy for worsening SOB, decreased sats 12/17 - Acute office visit for above, found to have R basilar 20% PTX 12/19 - residual small apical ptx, no airleak 12/20 - CT on water seal??  Patient took off suction to go to bathroom, lung with tiny residual ptx 12/21 - CT removed 12/22 - repeat 40% ptx, ct replaced 12/24 - R  VATS with resection / stapling of blebs, mechanical pleurodesis & CT placement.  Returned to ICU on vent 12/26 -20% apical PNTx, large air leak, on suction; Tachycardia   12/30 CT to water seal, cont air leak    INTERVAL HISTORY:  No c/o.  Cont to have air leak, CT now to water seal per CVTS.   VITAL SIGNS: Temp:  [97.7 F (36.5 C)-98.1 F (36.7 C)] 97.9 F (36.6 C) (12/31 0300) Pulse Rate:  [77-103] 78  (12/31 0800) Resp:  [17-20] 17  (12/31 0800) BP: (97-120)/(62-71) 106/62 mmHg (12/31 0800) SpO2:  [94 %-99 %] 99 % (12/31 0900) 4 liters   PHYSICAL EXAMINATION: General:  Elderly male, in NAD Neuro: awake and appropriate, MAE  HEENT:  WNL Cardiovascular:  RRR  Lungs: diminished R, few  scattered ronchi, otherwise fairly clear, R CT with ++4/7 air leak, 7/7 with cough  Abdomen:  NT, soft / active bs Ext: No edema, no cyanosis   Lab 08/24/12 0326 08/23/12 0353 08/22/12 0500  NA 136 133* 135  K 3.5 3.5 4.0  CL 98 92* 98  CO2 32 31 30  BUN 18 15 13   CREATININE 0.79 0.74 0.70  GLUCOSE 109* 110* 98    Lab 08/23/12 0353 08/22/12 0500 08/20/12 0427  HGB 10.9* 9.3* 10.1*  HCT 33.3* 29.0* 30.0*  WBC 12.4* 10.0 11.5*  PLT 298 249 196    Intake/Output Summary (Last 24 hours) at 08/24/12 1132 Last data filed at 08/24/12 1100  Gross per 24 hour  Intake   1610 ml  Output     50 ml  Net   1560 ml   CXR:  12/30 >> expanded rt lung - w/ small persistent right apical PTX and no sig change in patchy right sided airspace disease   IMPRESSION:  Spontaneous pneumothorax Initially with spontaneous PTX on admit, resolved and CT removed with re-occurrence.  Required replacement CT on 12/22.  Underwent VATS on 12/24 with bleb stapling and CT placement.  Still has large airleak 12/28 COPD with chronic bronchitis-oxygen dependent - completed  doxycycline for AECOPD 12/23.  Chronic pred @ 20 mg/day.  Currently without active bronchospasm;   PLAN: - CT to water seal per CVTS  but still has Significant air leak, will defer to CVTS. - Cont Prednisone 20 mg -- maint dose  - No change in BD rx  - F/U CXR.  Atrial Fibrillation  Hx of Afib - has been in sinus rhythm during this admit on rhythmol & verapamil (ER).  Mild Hypotension post op 12/24 (SBP 90's). Now resolved  PLAN: - Cont verapamil, rythmol, lopressor, lasix.  Stable on current regiment.  Hyperglycemia In setting of chronic steroid usage.   PLAN: -SSI  Hyponatremia - mild  PLAN:  F/u chem  Lasix. Replace K.  Pain In setting of post op VATS/ Chest tubes  PLAN: -prn analgesia   Alyson Reedy, M.D. [facility] Pulmonary/Critical Care Medicine. Pager: [phone]. After hours pager: [phone].

## 2012-08-25 ENCOUNTER — Inpatient Hospital Stay (HOSPITAL_COMMUNITY): Payer: Medicare Other

## 2012-08-25 LAB — CBC
Hemoglobin: 10 g/dL — ABNORMAL LOW (ref 13.0–17.0)
MCV: 97.8 fL (ref 78.0–100.0)
Platelets: 336 10*3/uL (ref 150–400)
RBC: 3.18 MIL/uL — ABNORMAL LOW (ref 4.22–5.81)
WBC: 12.5 10*3/uL — ABNORMAL HIGH (ref 4.0–10.5)

## 2012-08-25 LAB — PHOSPHORUS: Phosphorus: 2.8 mg/dL (ref 2.3–4.6)

## 2012-08-25 LAB — MAGNESIUM: Magnesium: 2.1 mg/dL (ref 1.5–2.5)

## 2012-08-25 LAB — BASIC METABOLIC PANEL
CO2: 30 mEq/L (ref 19–32)
Chloride: 98 mEq/L (ref 96–112)
Creatinine, Ser: 0.72 mg/dL (ref 0.50–1.35)
Glucose, Bld: 97 mg/dL (ref 70–99)
Sodium: 137 mEq/L (ref 135–145)

## 2012-08-25 NOTE — Progress Notes (Signed)
PULMONARY  / CRITICAL CARE MEDICINE  Name: Mark [name] MRN: [id number] DOB: November 16, 1940    LOS: 15  REFERRING PROVIDER:  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  Found to have moderate R pneumothorax.  CT placed and admitted for further care.  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   LINES / TUBES: R CT  12/17 >>>12/21 Repeat R CT 12/22>>> OETT 12/24>>>12/25 R [place] dual lumen12/24>>>   CULTURES: MRSA screen 12/18 > negative 12/24 VATS Bx>>>   ANTIBIOTICS: 12/16 Doxycycline (AECOPD) > 12/23  12/24 Zinacef  SIGNIFICANT EVENTS:  12/16 - started on doxy for worsening SOB, decreased sats 12/17 - Acute office visit for above, found to have R basilar 20% PTX 12/19 - residual small apical ptx, no airleak 12/20 - CT on water seal??  Patient took off suction to go to bathroom, lung with tiny residual ptx 12/21 - CT removed 12/22 - repeat 40% ptx, ct replaced 12/24 - R  VATS with resection / stapling of blebs, mechanical pleurodesis & CT placement.  Returned to ICU on vent 12/26 -20% apical PNTx, large air leak, on suction; Tachycardia   12/30 CT to water seal, cont air leak    INTERVAL HISTORY:    Cont to have air leak, CT now to water seal per CVTS.  Had precordial pain earlier, relieved by pain med. Feeling stronger with no other new issues. He is pleased with his care. VITAL SIGNS: Temp:  [97.6 F (36.4 C)-98.1 F (36.7 C)] 97.8 F (36.6 C) (01/01 0749) Pulse Rate:  [80-89] 80  (01/01 0300) Resp:  [13-20] 15  (01/01 0300) BP: (103-119)/(64-70) 103/65 mmHg (01/01 0300) SpO2:  [89 %-99 %] 96 % (01/01 0848) FiO2 (%):  [32 %] 32 % (01/01 0848) 4 liters    PHYSICAL EXAMINATION: General:  Elderly male, in NAD Neuro: awake and appropriate, MAE  HEENT:  WNL Cardiovascular:  RRR  Lungs: diminished R, clear on L, R CT with ++4/7 air leak, 7/7 with cough, Fluid fluctuates in tube.  Abdomen:  NT, soft / faint bs Ext: No edema, no cyanosis   Lab 08/25/12 0510 08/24/12 0326 08/23/12 0353  NA 137 136 133*  K 3.9 3.5 3.5  CL 98 98 92*  CO2 30 32 31  BUN 12 18 15   CREATININE 0.72 0.79 0.74  GLUCOSE 97 109* 110*    Lab 08/25/12 0510 08/23/12 0353 08/22/12 0500  HGB 10.0* 10.9* 9.3*  HCT 31.1* 33.3* 29.0*  WBC 12.5* 12.4* 10.0  PLT 336 298 249    Intake/Output Summary (Last 24 hours) at 08/25/12 0936 Last data filed at 08/25/12 0600  Gross per 24 hour  Intake    690 ml  Output    225 ml  Net    465 ml   CXR:  08/25/12-IMPRESSION:  Right-sided chest tubes remains in place, with slight improvement  in approximately 5 - 10% right-sided pneumothorax.  Original Report Authenticated By: Jeronimo Greaves, M.D.  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  Required replacement CT on 12/22.  Underwent VATS on 12/24 with bleb stapling and  CT placement.  Still has large airleak 12/28 COPD with chronic bronchitis-oxygen dependent - completed  doxycycline for AECOPD 12/23.  Chronic pred @ 20 mg/day.  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  PLAN: - CT to water seal per CVTS but still has Significant air leak, will defer to CVTS. - Cont Prednisone 20 mg -- maint dose  - No change in BD rx  - F/U CXR.  Atrial Fibrillation  Hx of Afib - has been in sinus rhythm during this admit on rhythmol & verapamil (ER).  Mild Hypotension post op 12/24 (SBP 90's). Now resolved  Episode of non-specific anterior chest pain AM 08/25/12- watch for  angina. PLAN: - Cont verapamil, rythmol, lopressor, lasix.  Stable on current regiment.  Hyperglycemia In setting of chronic steroid usage.   PLAN: -SSI  Hyponatremia - mild  PLAN:  F/u chem  Lasix. Replace K.  Pain In setting of post op VATS/ Chest tubes  PLAN: -prn analgesia   CD Maple Hudson, MD PCCM p [phone] After hours pager: [phone].

## 2012-08-25 NOTE — Progress Notes (Addendum)
[address]            [place] [address]          [phone]     8 Days Post-Op  Procedure(s) (LRB): VIDEO ASSISTED THORACOSCOPY (Right) RESECTION OF APICAL BLEB (Right) Subjective: Says he feels a little better  Objective  Telemetry sinus, pac's   Temp:  [97.6 F (36.4 C)-98.1 F (36.7 C)] 97.8 F (36.6 C) September 09, 2022 0749) Pulse Rate:  [80-89] 80  Sep 09, 2022 0300) Resp:  [13-20] 15  2022-09-09 0300) BP: (103-119)/(64-70) 103/65 mmHg 2022-09-09 0300) SpO2:  [89 %-99 %] 97 % 09/09/2022 0300)   Intake/Output Summary (Last 24 hours) at 2012/09/09 0846 Last data filed at Sep 09, 2012 0600  Gross per 24 hour  Intake    710 ml  Output    225 ml  Net    485 ml       General appearance: alert, cooperative, fatigued and mild distress Heart: regular rate and rhythm Lungs: fair air exchange Wound: healing ok  Lab Results:  Basename 2012/09/09 0510 08/24/12 0326  NA 137 136  K 3.9 3.5  CL 98 98  CO2 30 32  GLUCOSE 97 109*  BUN 12 18  CREATININE 0.72 0.79  CALCIUM 8.8 8.8  MG 2.1 --  PHOS 2.8 --   No results found for this basename: AST:2,ALT:2,ALKPHOS:2,BILITOT:2,PROT:2,ALBUMIN:2 in the last 72 hours No results found for this basename: LIPASE:2,AMYLASE:2 in the last 72 hours  Basename 2012-09-09 0510 08/23/12 0353  WBC 12.5* 12.4*  NEUTROABS -- --  HGB 10.0* 10.9*  HCT 31.1* 33.3*  MCV 97.8 96.5  PLT 336 298   No results found for this basename: CKTOTAL:4,CKMB:4,TROPONINI:4 in the last 72 hours No components found with this basename: POCBNP:3 No results found for this basename: DDIMER in the last 72 hours No results found for this basename: HGBA1C in the last 72 hours No results found for this basename: CHOL,HDL,LDLCALC,TRIG,CHOLHDL in the last 72 hours No results found for this basename: TSH,T4TOTAL,FREET3,T3FREE,THYROIDAB in the last 72 hours No results found for this basename: VITAMINB12,FOLATE,FERRITIN,TIBC,IRON,RETICCTPCT in the last 72  hours  Medications: Scheduled    . aspirin EC  81 mg Oral Daily  . bisacodyl  10 mg Oral QHS  . budesonide-formoterol  2 puff Inhalation BID  . cefTAZidime (FORTAZ)  IV  1 g Intravenous Q8H  . dextromethorphan-guaiFENesin  1 tablet Oral BID  . enoxaparin  40 mg Subcutaneous Q24H  . feeding supplement  1 Container Oral TID WC  . furosemide  40 mg Oral Daily  . levalbuterol  0.63 mg Nebulization Q6H  . pantoprazole  40 mg Oral Q1200  . predniSONE  20 mg Oral QAC breakfast  . propafenone  225 mg Oral BID  . sodium chloride  10 mL Intravenous Q12H  . sodium chloride  10-40 mL Intracatheter Q12H  . sodium chloride  3 mL Intravenous Q12H  . verapamil  240 mg Oral QHS     Radiology/Studies:   Dg Chest Port 1 View  09-09-12  *RADIOLOGY REPORT*  Clinical Data: Evaluate right-sided pneumothorax.  PORTABLE CHEST - 1 VIEW  Comparison: 1 day prior  Findings: 2 right-sided chest tubes are unchanged in position. Decrease and subpleural component of right-sided pneumothorax.  An approximately 10% residual right apical component is identified with the visceral pleural line 7 mm from the chest wall.  Normal heart size.  A  right-sided PICC line terminates at the cavoatrial junction. No pleural fluid.  Diffuse interstitial thickening.  Patchy areas of atelectasis on the right are similar. Mild volume loss at the left lung base.  IMPRESSION: Right-sided chest tubes remains in place, with slight improvement in approximately 5 - 10% right-sided pneumothorax.   Original Report Authenticated By: Jeronimo Greaves, M.D.    Dg Chest Port 1 View  08/24/2012  *RADIOLOGY REPORT*  Clinical Data: Right pneumothorax.  PORTABLE CHEST - 1 VIEW  Comparison: Chest x-ray 08/23/2012.  Findings: Two right-sided chest tubes remain in position with tips and side ports projecting over the right hemithorax.  Previously noted right-sided pneumothorax is slightly smaller than the prior examination occupying approximately 10 - 15% of  the right hemithorax volume on today's study. There is a right upper extremity PICC with tip terminating in the right atrium. Patchy multifocal interstitial opacities are noted throughout the left base and throughout the right mid and lower lung.  In the right midlung there is a more focal mass-like opacity adjacent to a set of sutures, which likely represents an area of some mild postoperative hemorrhage/edema.  Probable trace left pleural effusion.  No definite evidence of pulmonary edema.  Heart size is normal.  Mediastinal contours are unremarkable.  IMPRESSION: 1.  Support apparatus, as above. 2.  Slight decrease in size of the right pneumothorax compared to yesterday's examination.  Radiographic appearance of the chest is otherwise very similar, as detailed above.   Original Report Authenticated By: Trudie Reed, M.D.    Dg Chest Port 1 View  08/23/2012  *RADIOLOGY REPORT*  Clinical Data: Pneumothorax  PORTABLE CHEST - 1 VIEW  Comparison: Earlier the same day  Findings: 1248 hours.  The two right-sided chest tubes remain in place. Slight increase and right pneumothorax.  Right PICC line remains in place.  Left lung is clear. Telemetry leads overlie the chest.  IMPRESSION: Slight interval increase in right sided pneumothorax.   Original Report Authenticated By: Kennith Center, M.D.    Chest tube on H20 seal- large air leak  Assessment/Plan: S/P Procedure(s) (LRB): VIDEO ASSISTED THORACOSCOPY (Right) RESECTION OF APICAL BLEB (Right)  1 stable from surgical viewpoint with chest tubes in place, will poss be able to D/C with [employer]. Not sure if he is a candidate for bronchial valves. Cont aggressive medical management per pulmonology.     LOS: 15 days    GOLD,[name] 1/1/20148:46 AM   Today still with sig air leak, appears mostly from the posterior tube, if persistent from only posterior tube will consider d/c anterior tube chest tube tomorrow. Mini express is option. Can consider  pulmonary valve if leak persists  I have seen and examined Mark [name] and agree with the above assessment  and plan.  [name] MD Beeper [phone] Office [phone] 08/25/2012 12:39 PM

## 2012-08-26 ENCOUNTER — Inpatient Hospital Stay (HOSPITAL_COMMUNITY): Payer: Medicare Other

## 2012-08-26 DIAGNOSIS — J438 Other emphysema: Secondary | ICD-10-CM

## 2012-08-26 DIAGNOSIS — J439 Emphysema, unspecified: Secondary | ICD-10-CM | POA: Diagnosis present

## 2012-08-26 LAB — COMPREHENSIVE METABOLIC PANEL
ALT: 23 U/L (ref 0–53)
AST: 13 U/L (ref 0–37)
Albumin: 2.7 g/dL — ABNORMAL LOW (ref 3.5–5.2)
Alkaline Phosphatase: 48 U/L (ref 39–117)
BUN: 17 mg/dL (ref 6–23)
CO2: 32 mEq/L (ref 19–32)
Calcium: 9 mg/dL (ref 8.4–10.5)
Chloride: 97 mEq/L (ref 96–112)
Creatinine, Ser: 0.78 mg/dL (ref 0.50–1.35)
GFR calc Af Amer: >90 mL/min (ref >90)
GFR calc non Af Amer: 88 mL/min — ABNORMAL LOW (ref >90)
Glucose, Bld: 101 mg/dL — ABNORMAL HIGH (ref 70–99)
Potassium: 4.2 mEq/L (ref 3.5–5.1)
Sodium: 137 mEq/L (ref 135–145)
Total Bilirubin: 0.2 mg/dL — ABNORMAL LOW (ref 0.3–1.2)
Total Protein: 6.4 g/dL (ref 6.0–8.3)

## 2012-08-26 LAB — BASIC METABOLIC PANEL
BUN: 17 mg/dL (ref 6–23)
CO2: 31 mEq/L (ref 19–32)
Calcium: 8.8 mg/dL (ref 8.4–10.5)
Chloride: 99 mEq/L (ref 96–112)
Creatinine, Ser: 0.74 mg/dL (ref 0.50–1.35)
GFR calc Af Amer: >90 mL/min (ref >90)
GFR calc non Af Amer: 90 mL/min — ABNORMAL LOW (ref >90)
Glucose, Bld: 95 mg/dL (ref 70–99)
Potassium: 3.5 mEq/L (ref 3.5–5.1)
Sodium: 140 mEq/L (ref 135–145)

## 2012-08-26 LAB — CBC
HCT: 32.5 % — ABNORMAL LOW (ref 39.0–52.0)
HCT: 29.8 % — ABNORMAL LOW (ref 39.0–52.0)
Hemoglobin: 10.4 g/dL — ABNORMAL LOW (ref 13.0–17.0)
Hemoglobin: 9.3 g/dL — ABNORMAL LOW (ref 13.0–17.0)
MCH: 31.5 pg (ref 26.0–34.0)
MCH: 30.5 pg (ref 26.0–34.0)
MCHC: 32 g/dL (ref 30.0–36.0)
MCHC: 31.2 g/dL (ref 30.0–36.0)
MCV: 98.5 fL (ref 78.0–100.0)
MCV: 97.7 fL (ref 78.0–100.0)
Platelets: 415 10*3/uL — ABNORMAL HIGH (ref 150–400)
Platelets: 381 10*3/uL (ref 150–400)
RBC: 3.3 MIL/uL — ABNORMAL LOW (ref 4.22–5.81)
RBC: 3.05 MIL/uL — ABNORMAL LOW (ref 4.22–5.81)
RDW: 14.7 % (ref 11.5–15.5)
RDW: 14.8 % (ref 11.5–15.5)
WBC: 13.7 10*3/uL — ABNORMAL HIGH (ref 4.0–10.5)
WBC: 11.1 10*3/uL — ABNORMAL HIGH (ref 4.0–10.5)

## 2012-08-26 LAB — APTT: aPTT: 30 seconds (ref 24–37)

## 2012-08-26 LAB — PROTIME-INR
INR: 1.02 (ref 0.00–1.49)
Prothrombin Time: 13.3 seconds (ref 11.6–15.2)

## 2012-08-26 MED ORDER — POTASSIUM CHLORIDE CRYS ER 20 MEQ PO TBCR
40.0000 meq | EXTENDED_RELEASE_TABLET | Freq: Once | ORAL | Status: AC
  Administered 2012-08-26: 40 meq via ORAL
  Filled 2012-08-26: qty 2

## 2012-08-26 MED ORDER — ASPIRIN EC 81 MG PO TBEC: 81.0000 mg | DELAYED_RELEASE_TABLET | Freq: Every day | ORAL | Status: DC

## 2012-08-26 MED ORDER — ENSURE COMPLETE PO LIQD
237.0000 mL | Freq: Three times a day (TID) | ORAL | Status: DC
  Administered 2012-08-26 – 2012-08-31 (×13): 237 mL via ORAL

## 2012-08-26 NOTE — Progress Notes (Signed)
Anterior CT d/c'd per MD order.  Pt tolerated well.  Remaining CT connected to new [place] per MD request.  Pt go to for CT chest now that CT removed.    Roselie Awkward, RN

## 2012-08-26 NOTE — Progress Notes (Signed)
Patient ID: Mark [name], male   DOB: 1940-09-24, 72 y.o.   MRN: [id number]   Dg Chest Port 1 View  08/26/2012  *RADIOLOGY REPORT*  Clinical Data: Right chest tubes.  Follow-up right pneumothorax.  PORTABLE CHEST - 1 VIEW  Comparison: Yesterday.  Findings: The heart remains normal in size.  Decreased density at both lung bases.  Decreased size of the right pneumothorax, currently estimated at less than 5% of the right hemithorax. Stable right chest tubes and right PICC.  Unremarkable bones.  IMPRESSION:  1.  Decreased size of the right pneumothorax to less than 5% of the right hemithorax. 2.  Decreased bibasilar atelectasis.   Original Report Authenticated By: Beckie Salts, M.D.    Dg Chest Port 1 View  08/25/2012  *RADIOLOGY REPORT*  Clinical Data: Evaluate right-sided pneumothorax.  PORTABLE CHEST - 1 VIEW  Comparison: 1 day prior  Findings: 2 right-sided chest tubes are unchanged in position. Decrease and subpleural component of right-sided pneumothorax.  An approximately 10% residual right apical component is identified with the visceral pleural line 7 mm from the chest wall.  Normal heart size.  A right-sided PICC line terminates at the cavoatrial junction. No pleural fluid.  Diffuse interstitial thickening.  Patchy areas of atelectasis on the right are similar. Mild volume loss at the left lung base.  IMPRESSION: Right-sided chest tubes remains in place, with slight improvement in approximately 5 - 10% right-sided pneumothorax.   Original Report Authenticated By: Jeronimo Greaves, M.D.    Ct Super D Chest Wo Contrast  08/26/2012  *RADIOLOGY REPORT*  Clinical Data:  Persistent air leak from chest tube.  Considering pulmonary valve placement.  Assess bronchial anatomy.  CT CHEST WITHOUT CONTRAST  Technique:  Multidetector CT imaging of the chest was performed using thin slice collimation for electromagnetic bronchoscopy planning purposes, without intravenous contrast.  Comparison:  Chest radiograph 08/26/2012  and CT chest 08/16/2012.  Findings:  Subcutaneous emphysema is seen in the low right neck and anterior right chest wall.  No pathologically enlarged mediastinal, hilar or axillary lymph nodes.  Atherosclerotic calcification of the arterial vasculature, including coronary arteries.  Heart size normal.  No pericardial effusion.  Tiny amount of right pleural fluid.  Moderate right pneumothorax with right chest tube terminating in the anterior mediastinal region.  Diffuse centrilobular emphysema with superimposed airspace opacity in the right lower lobe.  Postoperative changes and associated irregular soft tissue are seen in the right upper lobe, along the fissures.  No left pleural fluid. Postoperative changes are also seen in the lower right hemithorax.  Small amount of dependent debris in the trachea.  Incidental imaging of the upper abdomen shows no acute findings. No worrisome lytic or sclerotic lesions.  IMPRESSION:  1.  Moderate right hydropneumothorax with right chest tube in place, terminating in the anterior mediastinal region. Subcutaneous emphysema along the right chest wall. 2.  Postoperative changes in the right hemithorax with associated soft tissue in the upper right hemithorax. 3.  Centrilobular emphysema with possible superimposed airspace disease in the right lower lobe.  The latter is of unknown chronicity and appears somewhat fibrotic.   Original Report Authenticated By: Leanna Battles, M.D.     Patient continues to have this very significant air leak. I reviewed the radiographic findings and discussed with him placement of intrabronchial valves and attempt to decrease and/or stop the air leak. The risks and options of doing this are discussed with the patient in detail. He is aware of that the pulmonary  valves are approved by the FDA for compassionate use, and that their use would require followup bronchoscopy to remove them in the future. Patient is willing to proceed we'll plan bronchoscopy  under general anesthesia with placement of intrabronchial valves.   [name] MD  Beeper [phone] Office [phone] 08/26/2012 6:15 PM

## 2012-08-26 NOTE — Progress Notes (Addendum)
NUTRITION FOLLOW UP  Intervention:    Change supplement to Ensure Complete PO TID between meals, each supplement provides 350 kcal and 13 grams of protein.  Nutrition Dx:   Increased nutrient needs related to recent procedure as evidenced by estimated nutrition needs.  Goal:   Intake to meet >90% of estimated nutrition needs, unmet.  Monitor:   PO intake, weight trend, labs, supplement tolerance.  Assessment:   Anterior chest tube removed today.  Intake of meals has been good, consuming > 75% of meals.  Only ate 50% of a few meals a few days ago due to constipation.  Appetite is good. Per RN, patient is eating well at meals, but does not drink the Ensure since it is scheduled with meals.  Patient is willing to drink Ensure supplements TID between meals.  Spoke with Doree Fudge, PA regarding current CHO-modified diet order.  Do not feel that patient needs CHO restriction, no history of diabetes.  She will liberalize diet to Regular.    Height: Ht Readings from Last 1 Encounters:  08/10/12 5\' 11"  (1.803 m)    Weight Status:   Wt Readings from Last 1 Encounters:  08/22/12 160 lb 15 oz (73 kg)  Weight down from 167 lb on 12/27.  No more recent weight available.  Re-estimated needs:  Kcal: 2000-2300 Protein: 110-120 gm Fluid: 2-2.3 L  Skin: chest incision  Diet Order: CHO-modified medium calorie with Ensure Pudding TID with meals.     Intake/Output Summary (Last 24 hours) at 08/26/12 1353 Last data filed at 08/26/12 0800  Gross per 24 hour  Intake    750 ml  Output    600 ml  Net    150 ml    Last BM: 1/1   Labs:   Lab 08/26/12 0455 08/25/12 0510 08/24/12 0326  NA 140 137 136  K 3.5 3.9 3.5  CL 99 98 98  CO2 31 30 32  BUN 17 12 18   CREATININE 0.74 0.72 0.79  CALCIUM 8.8 8.8 8.8  MG -- 2.1 --  PHOS -- 2.8 --  GLUCOSE 95 97 109*    CBG (last 3)  No results found for this basename: GLUCAP:3 in the last 72 hours  Scheduled Meds:   . aspirin EC  81  mg Oral Daily  . bisacodyl  10 mg Oral QHS  . budesonide-formoterol  2 puff Inhalation BID  . cefTAZidime (FORTAZ)  IV  1 g Intravenous Q8H  . enoxaparin  40 mg Subcutaneous Q24H  . feeding supplement  1 Container Oral TID WC  . levalbuterol  0.63 mg Nebulization Q6H  . pantoprazole  40 mg Oral Q1200  . predniSONE  20 mg Oral QAC breakfast  . propafenone  225 mg Oral BID  . sodium chloride  10-40 mL Intracatheter Q12H  . verapamil  240 mg Oral QHS    Continuous Infusions: None  Joaquin Courts, RD, LDN, CNSC Pager# [phone] After Hours Pager# [phone]

## 2012-08-26 NOTE — Progress Notes (Signed)
Physical Therapy Treatment Patient Details Name: Mark [name] MRN: [id number] DOB: 1941/05/28 Today's Date: 08/26/2012 Time: 4098-1191 PT Time Calculation (min): 19 min  PT Assessment / Plan / Recommendation Comments on Treatment Session  Pt able to improve overall ambulation with no standing rest breaks and decrease supplemental O2 to 3L.  Pt will need to perform stairs next session.    Follow Up Recommendations  Home health PT;Supervision/Assistance - 24 hour     Equipment Recommendations  None recommended by PT    Frequency Min 3X/week   Plan Discharge plan remains appropriate;Frequency remains appropriate    Precautions / Restrictions Precautions Precautions: Fall Precaution Comments: right chest tube Restrictions Weight Bearing Restrictions: No   Pertinent Vitals/Pain No c/o pain; increase SOB 2/4 with ambulation     Mobility  Bed Mobility Bed Mobility: Sit to Supine Sit to Supine: 5: Supervision;HOB elevated Details for Bed Mobility Assistance: Supervision for safety with lines and chest tube Transfers Transfers: Sit to Stand;Stand to Sit Sit to Stand: 5: Supervision;From chair/3-in-1 Stand to Sit: 5: Supervision;To bed Details for Transfer Assistance: Supervision for safety with cues for hand placement Ambulation/Gait Ambulation/Gait Assistance: 5: Supervision Ambulation Distance (Feet): 300 Feet Assistive device: Rolling walker Ambulation/Gait Assistance Details: Supervision for safety with cues for upright posture and relax shoulders.  Pt able to improve overall ambulation with decrease supplemental O2 to 3L. Gait Pattern: Step-through pattern;Decreased stance time - left Stairs: No Wheelchair Mobility Wheelchair Mobility: No     PT Diagnosis:    PT Problem List:   PT Treatment Interventions:     PT Goals Acute Rehab PT Goals PT Goal Formulation: With patient Time For Goal Achievement: 08/27/12 Potential to Achieve Goals: Good Pt will go Supine/Side  to Sit: Independently PT Goal: Supine/Side to Sit - Progress: Progressing toward goal Pt will go Sit to Stand: Independently PT Goal: Sit to Stand - Progress: Progressing toward goal Pt will Ambulate: 51 - 150 feet;with least restrictive assistive device;with modified independence PT Goal: Ambulate - Progress: Progressing toward goal  Visit Information  Last PT Received On: 08/26/12 Assistance Needed: +1    Subjective Data  Subjective: "I've been up and walking already today." Patient Stated Goal: To go home   Cognition  Overall Cognitive Status: Appears within functional limits for tasks assessed/performed Arousal/Alertness: Awake/alert Orientation Level: Appears intact for tasks assessed Behavior During Session: [facility] for tasks performed    End of Session PT - End of Session Equipment Utilized During Treatment: Gait belt;Oxygen (3L) Activity Tolerance: Patient tolerated treatment well Patient left: in bed;with call bell/phone within reach Nurse Communication: Mobility status   GP     Mark [name] 08/26/2012, 3:44 PM Jake Shark, PT DPT [phone]

## 2012-08-26 NOTE — Progress Notes (Addendum)
                   [address]            [place] [address]          [phone]    9 Days Post-Op Procedure(s) (LRB): VIDEO ASSISTED THORACOSCOPY (Right) RESECTION OF APICAL BLEB (Right)  Subjective: Patient without complaints  Objective: Vital signs in last 24 hours: Temp:  [97.6 F (36.4 C)-98.1 F (36.7 C)] 98.1 F (36.7 C) (01/02 0300) Pulse Rate:  [74-98] 74  (01/02 0300) Cardiac Rhythm:  [-] Normal sinus rhythm (01/02 0300) Resp:  [11-21] 16  (01/02 0300) BP: (108-127)/(55-95) 119/62 mmHg (01/02 0300) SpO2:  [92 %-99 %] 95 % (01/02 0300) FiO2 (%):  [32 %] 32 % (01/01 0848)     Intake/Output from previous day: 01/01 0701 - 01/02 0700 In: 1300 [P.O.:1200; IV Piggyback:100] Out: 1750 [Urine:1650; Chest Tube:100]   Physical Exam:  Cardiovascular: RRR Pulmonary: Mostly clear to auscultation bilaterally; no rales, wheezes, or rhonchi. Abdomen: Soft, non tender, bowel sounds present. Wounds: Clean and dry.  No erythema or signs of infection. Chest Tube: to water seal, +4 air leak  Lab Results: CBC: Basename 08/26/12 0455 08/25/12 0510  WBC 13.7* 12.5*  HGB 10.4* 10.0*  HCT 32.5* 31.1*  PLT 415* 336   BMET:  Basename 08/26/12 0455 08/25/12 0510  NA 140 137  K 3.5 3.9  CL 99 98  CO2 31 30  GLUCOSE 95 97  BUN 17 12  CREATININE 0.74 0.72  CALCIUM 8.8 8.8    PT/INR: No results found for this basename: LABPROT,INR in the last 72 hours ABG:  INR: Will add last result for INR, ABG once components are confirmed Will add last 4 CBG results once components are confirmed  Assessment/Plan:  1. CV - SR. History of afib. On Verapamil CR 240 daily and Rythmol 225 bid (as taken pre op) 2.  Pulmonary - Chest tube with 100 cc of output last 24 hours and with +4 air leak. CXR this am appears to be stable (right ptx appears decreased). Both chest tubes still in place to water seal.Possibly remove anterior chest tube today. On 3 liters via [place] and was  on oxygen prior to admission 3.H and H stable at 10.4 and 32.5 4.Leukocytosis-WBC slightly increase from 12.5 to 13.7. Remains afebrile and no signs of wound infection. On Fortaz Q 8 hours. 5.Supplement potassium  Mark [name],Mark [name] 08/26/2012,7:26 AM    Still with air leak from post chest tube, will d/c anterior tube. Considering placement of pul valves. Will get ct to access bronchial anatomy.  I have seen and examined Mark [name] and agree with the above assessment  and plan.  [name] MD Beeper [phone] Office [phone] 08/26/2012 8:48 AM

## 2012-08-26 NOTE — Progress Notes (Signed)
PULMONARY  / CRITICAL CARE MEDICINE  Name: Mark [name] MRN: [id number] DOB: [birth year]    LOS: 16  REFERRING PROVIDER:  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  Found to have moderate R pneumothorax.  CT placed and admitted for further care.  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   LINES / TUBES: R CT  12/17 >>>12/21 Repeat R CT 12/22>>> ETT 12/24>>>12/25 R [place] dual lumen12/24 >> 12/29 RUE PICC 12/29 >>    CULTURES: MRSA screen 12/18 > negative  ANTIBIOTICS: 12/16 Doxycycline (AECOPD) > 12/23  12/24 Zinacef X 1   SIGNIFICANT EVENTS:  12/16 - started on doxy for worsening SOB, decreased sats 12/17 - Acute office visit for above, found to have R basilar 20% PTX 12/19 - residual small apical ptx, no airleak 12/20 - CT on water seal??  Patient took off suction to go to bathroom, lung with tiny residual ptx 12/21 - CT removed 12/22 - repeat 40% ptx, ct replaced 12/24 - R  VATS with resection / stapling of blebs, mechanical pleurodesis & CT placement.  Returned to ICU on vent 12/26 -20% apical PNTx, large air leak, on suction; Tachycardia   12/30 CT to water seal, cont air leak  1/02 CT chest: Moderate right PTX with right chest tube in  place. Postoperative changes in the right hemithorax with associated soft tissue in the upper right hemithorax. Severe emphysema with possible superimposed airspace disease in the right lower lobe.    INTERVAL HISTORY:    No new complaints. No distress  VITAL SIGNS: Temp:  [97.5 F (36.4 C)-98.1 F (36.7 C)] 97.9 F (36.6 C) (01/02 1442) Pulse Rate:  [74-94] 91  (01/02 1200) Resp:  [11-21] 16  (01/02 1442) BP:  (108-131)/(55-71) 131/64 mmHg (01/02 1442) SpO2:  [94 %-99 %] 94 % (01/02 1442)   PHYSICAL EXAMINATION: General: NAD Neuro: no focal deficits HEENT:  WNL Cardiovascular:  RRR  Lungs: diminished BS throughout, no wheeze, air leak persists  Abdomen:  NT, soft / faint bs Ext: No edema, no cyanosis   Lab 08/26/12 0455 08/25/12 0510 08/24/12 0326  NA 140 137 136  K 3.5 3.9 3.5  CL 99 98 98  CO2 31 30 32  BUN 17 12 18   CREATININE 0.74 0.72 0.79  GLUCOSE 95 97 109*    Lab 08/26/12 0455 08/25/12 0510 08/23/12 0353  HGB 10.4* 10.0* 10.9*  HCT 32.5* 31.1* 33.3*  WBC 13.7* 12.5* 12.4*  PLT 415* 336 298    Intake/Output Summary (Last 24 hours) at 08/26/12 1722 Last data filed at 08/26/12 1700  Gross per 24 hour  Intake    990 ml  Output    700 ml  Net    290 ml   CXR:  NSC R apical ptx  IMPRESSION:  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  Required replacement CT on 12/22.  Underwent VATS on 12/24 with bleb stapling and CT placement.  Still has large airleak 12/28 COPD with chronic bronchitis-oxygen dependent - completed  doxycycline for AECOPD 12/23.  Chronic pred @ 20 mg/day.  Currently without active bronchospasm;  Persistent leak reflects severe emphysema and chronic steroid thinning of connective tissue.  PLAN: - CT to water seal per CVTS but still has Significant air leak, will defer to CVTS. - Cont Prednisone 20 mg -- maint dose  - No change in BD rx  - F/U CXR. -Transfer to 2000 floor  Atrial Fibrillation  Hx of Afib - has been in sinus rhythm during this admit on rhythmol & verapamil (ER).  Mild Hypotension post op 12/24 (SBP 90's). Now resolved  Episode of non-specific anterior chest pain AM 08/25/12- watch for angina. PLAN: - Cont verapamil, rythmol, lopressor, lasix.  Stable on current regiment.  Hyperglycemia, resolved In setting of  chronic steroid usage.   PLAN: -Follow on BMET, no SSI indicated  Hyponatremia, resolved - mild  PLAN:  monitor  Pain In setting of post op VATS/ Chest tubes  PLAN: -prn analgesia    Billy Fischer, MD ; [facility] service Mobile [phone].  After 5:30 PM or weekends, call [phone]

## 2012-08-27 ENCOUNTER — Inpatient Hospital Stay (HOSPITAL_COMMUNITY): Payer: Medicare Other | Admitting: Anesthesiology

## 2012-08-27 ENCOUNTER — Encounter: Payer: Self-pay | Admitting: Cardiothoracic Surgery

## 2012-08-27 ENCOUNTER — Inpatient Hospital Stay (HOSPITAL_COMMUNITY): Payer: Medicare Other

## 2012-08-27 ENCOUNTER — Encounter (HOSPITAL_COMMUNITY): Payer: Self-pay | Admitting: Anesthesiology

## 2012-08-27 ENCOUNTER — Encounter (HOSPITAL_COMMUNITY)
Admission: EM | Disposition: A | Discharge status: Home Health | Payer: Self-pay | Source: Home / Self Care | Attending: Pulmonary Disease

## 2012-08-27 DIAGNOSIS — J930 Spontaneous tension pneumothorax: Secondary | ICD-10-CM

## 2012-08-27 HISTORY — PX: VIDEO BRONCHOSCOPY: SHX5072

## 2012-08-27 LAB — BASIC METABOLIC PANEL
CO2: 31 mEq/L (ref 19–32)
Calcium: 8.7 mg/dL (ref 8.4–10.5)
Chloride: 101 mEq/L (ref 96–112)
Glucose, Bld: 95 mg/dL (ref 70–99)
Sodium: 141 mEq/L (ref 135–145)

## 2012-08-27 LAB — CBC
Hemoglobin: 10.1 g/dL — ABNORMAL LOW (ref 13.0–17.0)
MCH: 31.2 pg (ref 26.0–34.0)
RBC: 3.24 MIL/uL — ABNORMAL LOW (ref 4.22–5.81)

## 2012-08-27 SURGERY — BRONCHOSCOPY, VIDEO-ASSISTED
Anesthesia: General | Site: Chest | Wound class: Clean Contaminated

## 2012-08-27 MED ORDER — LIDOCAINE HCL (CARDIAC) 20 MG/ML IV SOLN
INTRAVENOUS | Status: DC | PRN
  Administered 2012-08-27: 50 mg via INTRAVENOUS

## 2012-08-27 MED ORDER — MIDAZOLAM HCL 2 MG/2ML IJ SOLN: 1.0000 mg | INTRAMUSCULAR | Status: DC | PRN

## 2012-08-27 MED ORDER — OXYCODONE HCL 5 MG PO TABS: 5.0000 mg | ORAL_TABLET | Freq: Once | ORAL | Status: DC | PRN

## 2012-08-27 MED ORDER — NEOSTIGMINE METHYLSULFATE 1 MG/ML IJ SOLN
INTRAMUSCULAR | Status: DC | PRN
  Administered 2012-08-27: 3 mg via INTRAVENOUS

## 2012-08-27 MED ORDER — LACTATED RINGERS IV SOLN
INTRAVENOUS | Status: DC | PRN
  Administered 2012-08-27: 13:00:00 via INTRAVENOUS

## 2012-08-27 MED ORDER — ROCURONIUM BROMIDE 100 MG/10ML IV SOLN
INTRAVENOUS | Status: DC | PRN
  Administered 2012-08-27: 40 mg via INTRAVENOUS

## 2012-08-27 MED ORDER — OXYCODONE HCL 5 MG/5ML PO SOLN: 5.0000 mg | Freq: Once | ORAL | Status: DC | PRN

## 2012-08-27 MED ORDER — FENTANYL CITRATE 0.05 MG/ML IJ SOLN
INTRAMUSCULAR | Status: DC | PRN
  Administered 2012-08-27 (×2): 50 ug via INTRAVENOUS

## 2012-08-27 MED ORDER — SODIUM CHLORIDE 0.9 % IR SOLN
Status: DC | PRN
  Administered 2012-08-27: 1000 mL

## 2012-08-27 MED ORDER — PROMETHAZINE HCL 25 MG/ML IJ SOLN: 6.2500 mg | INTRAMUSCULAR | Status: DC | PRN

## 2012-08-27 MED ORDER — FENTANYL CITRATE 0.05 MG/ML IJ SOLN: 50.0000 ug | Freq: Once | INTRAMUSCULAR | Status: DC

## 2012-08-27 MED ORDER — PROPOFOL 10 MG/ML IV BOLUS
INTRAVENOUS | Status: DC | PRN
  Administered 2012-08-27: 150 mg via INTRAVENOUS

## 2012-08-27 MED ORDER — GLYCOPYRROLATE 0.2 MG/ML IJ SOLN
INTRAMUSCULAR | Status: DC | PRN
  Administered 2012-08-27: 0.6 mg via INTRAVENOUS

## 2012-08-27 MED ORDER — HYDROMORPHONE HCL PF 1 MG/ML IJ SOLN: 0.2500 mg | INTRAMUSCULAR | Status: DC | PRN

## 2012-08-27 SURGICAL SUPPLY — 25 items
BRUSH CYTOL CELLEBRITY 1.5X140 (MISCELLANEOUS) IMPLANT
CANISTER SUCTION 2500CC (MISCELLANEOUS) ×2 IMPLANT
CATH BALLN 4FR (CATHETERS) ×1 IMPLANT
CATH EMB 4FR 80CM (CATHETERS) ×1 IMPLANT
CATH EMB 5FR 80CM (CATHETERS) ×1 IMPLANT
CLOTH BEACON ORANGE TIMEOUT ST (SAFETY) ×2 IMPLANT
CONT SPEC 4OZ CLIKSEAL STRL BL (MISCELLANEOUS) ×2 IMPLANT
COVER TABLE BACK 60X90 (DRAPES) ×2 IMPLANT
FORCEPS BIOP RJ4 1.8 (CUTTING FORCEPS) IMPLANT
GLOVE BIO SURGEON STRL SZ 6.5 (GLOVE) ×4 IMPLANT
KIT AIRWAY SIZING W/B5-23 BALL (VALVE) ×1 IMPLANT
KIT ROOM TURNOVER OR (KITS) ×2 IMPLANT
MARKER SKIN DUAL TIP RULER LAB (MISCELLANEOUS) ×2 IMPLANT
NDL BIOPSY TRANSBRONCH 21G (NEEDLE) IMPLANT
NEEDLE BIOPSY TRANSBRONCH 21G (NEEDLE) IMPLANT
NS IRRIG 1000ML POUR BTL (IV SOLUTION) ×2 IMPLANT
OIL SILICONE PENTAX (PARTS (SERVICE/REPAIRS)) ×2 IMPLANT
SPONGE GAUZE 4X4 12PLY (GAUZE/BANDAGES/DRESSINGS) ×2 IMPLANT
STOPCOCK 4 WAY LG BORE MALE ST (IV SETS) ×2 IMPLANT
SYR 20ML ECCENTRIC (SYRINGE) ×2 IMPLANT
SYR 5ML LL (SYRINGE) ×1 IMPLANT
TOWEL OR 17X24 6PK STRL BLUE (TOWEL DISPOSABLE) ×2 IMPLANT
TRAP SPECIMEN MUCOUS 40CC (MISCELLANEOUS) ×2 IMPLANT
TUBE CONNECTING 12X1/4 (SUCTIONS) ×2 IMPLANT
WATER STERILE IRR 1000ML POUR (IV SOLUTION) ×2 IMPLANT

## 2012-08-27 NOTE — Anesthesia Preprocedure Evaluation (Signed)
Anesthesia Evaluation  Patient identified by MRN, date of birth, ID band Patient awake    Reviewed: Allergy & Precautions, H&P , NPO status , Patient's Chart, lab work & pertinent test results  History of Anesthesia Complications Negative for: history of anesthetic complications  Airway Mallampati: II TM Distance: >3 FB Neck ROM: Full    Dental  (+) Poor Dentition and Dental Advisory Given   Pulmonary shortness of breath, with exertion and at rest, COPD COPD inhaler,  Large leak s/p bleb resection breath sounds clear to auscultation        Cardiovascular + dysrhythmias Atrial Fibrillation Rhythm:Regular Rate:Normal  Hx afib NSR now  Weaned off coumadin normal INR Echo 2011 ef - 55%    Neuro/Psych  Headaches,    GI/Hepatic   Endo/Other    Renal/GU      Musculoskeletal   Abdominal   Peds  Hematology   Anesthesia Other Findings   Reproductive/Obstetrics                           Anesthesia Physical Anesthesia Plan  ASA: III  Anesthesia Plan: General   Post-op Pain Management:    Induction: Intravenous  Airway Management Planned: Oral ETT  Additional Equipment:   Intra-op Plan:   Post-operative Plan: Extubation in OR  Informed Consent: I have reviewed the patients History and Physical, chart, labs and discussed the procedure including the risks, benefits and alternatives for the proposed anesthesia with the patient or authorized representative who has indicated his/her understanding and acceptance.     Plan Discussed with: CRNA and Surgeon  Anesthesia Plan Comments:         Anesthesia Quick Evaluation

## 2012-08-27 NOTE — Transfer of Care (Signed)
Immediate Anesthesia Transfer of Care Note  Patient: Mark [name]  Procedure(s) Performed: Procedure(s) (LRB) with comments: VIDEO BRONCHOSCOPY (N/A) - evaluation of endobronchial valves  Patient Location: PACU  Anesthesia Type:General  Level of Consciousness: awake and alert   Airway & Oxygen Therapy: Patient Spontanous Breathing and Patient connected to face mask oxygen  Post-op Assessment: Report given to PACU RN and Post -op Vital signs reviewed and stable  Post vital signs: Reviewed and stable  Complications: No apparent anesthesia complications

## 2012-08-27 NOTE — Op Note (Signed)
Bronchoscopy Procedure Note [name] [id number] [birth year]  Procedure: Bronchoscopy Indications: Diagnostic evaluation of the airways, Obtain specimens for culture and/or other diagnostic studies, Remove secretions and Sizing in iisolation off  airway leak for possible placement of intrabronchial valves  Procedure Details Consent: Risks of procedure as well as the alternatives and risks of each were explained to the (patient/caregiver).  Consent for procedure obtained. Time Out: Verified patient identification, verified procedure, site/side was marked, verified correct patient position, special equipment/implants available, medications/allergies/relevent history reviewed, required imaging and test results available.  Performed  In preparation for procedure, bronchoscope lubricated and Patient underwent general endotracheal anesthesia. Sedation: General endotracheal anesthesia  Under general endotracheal anesthesia the video bronchoscope was passed through the endotracheal tube significant secretions were noted in the right middle and lower lobes these were suctioned and removed and sent for Gram stain and culture. The procedure was to attempt to isolate the air leak. With a #5 Fogarty balloon in a serial manner an obstructed upper lobe lower lobe and middle lobe bronchi and noted the degree of change in air leak. Thorough examination and multiple attempts we were unable to isolate the airleak to any specific rhonchi or some bronchi, with the suggestion that the airleak was from multiple lobes we decided not to proceed with placement of intra  bronchial valves  Airway entered and the following bronchi were examined: RUL, RML, RLL, LUL and LLL.   Procedures performed: Brushings performed Bronchoscope removed.    Evaluation Hemodynamic Status: BP stable throughout; O2 sats: stable throughout Patient's Current Condition: stable Specimens:  Sent serosanguinous fluid Complications: No apparent  complications Patient did tolerate procedure well.   Mark [name] 08/27/2012

## 2012-08-27 NOTE — Anesthesia Procedure Notes (Signed)
Procedure Name: Intubation Date/Time: 08/27/2012 1:18 PM Performed by: Gwenyth Allegra Pre-anesthesia Checklist: Emergency Drugs available, Patient identified, Timeout performed, Suction available and Patient being monitored Patient Re-evaluated:Patient Re-evaluated prior to inductionOxygen Delivery Method: Circle system utilized Preoxygenation: Pre-oxygenation with 100% oxygen Intubation Type: IV induction Ventilation: Mask ventilation without difficulty and Oral airway inserted - appropriate to patient size Laryngoscope Size: Mac and 4 Grade View: Grade I Tube type: Oral Tube size: 8.5 mm Number of attempts: 1 Airway Equipment and Method: Stylet Secured at: 22 cm Tube secured with: Tape Dental Injury: Teeth and Oropharynx as per pre-operative assessment

## 2012-08-27 NOTE — Progress Notes (Signed)
PT Cancellation Note  Patient Details Name: Mark [name] MRN: [id number] DOB: 1941-02-04   Cancelled Treatment:    Reason Eval/Treat Not Completed: Patient at procedure or test/unavailable. Pt at bronchoscopy. Will attempt treatment tomorrow   Milana Kidney 08/27/2012, 4:20 PM

## 2012-08-27 NOTE — Progress Notes (Signed)
PULMONARY  / CRITICAL CARE MEDICINE  Name: Mark [name] MRN: [id number] DOB: November 06, 1940    LOS: 17  REFERRING PROVIDER:  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  Found to have moderate R pneumothorax.  CT placed and admitted for further care.  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   LINES / TUBES: R CT  12/17 >>>12/21 Repeat R CT 12/22>>> ETT 12/24>>>12/25 R [place] dual lumen12/24 >> 12/29 RUE PICC 12/29 >>    CULTURES: MRSA screen 12/18 > negative  ANTIBIOTICS: 12/16 Doxycycline (AECOPD) > 12/23  12/24 Zinacef X 1 12/26/ Fortaz    SIGNIFICANT EVENTS:  12/16 - started on doxy for worsening SOB, decreased sats 12/17 - Acute office visit for above, found to have R basilar 20% PTX 12/19 - residual small apical ptx, no airleak 12/20 - CT on water seal??  Patient took off suction to go to bathroom, lung with tiny residual ptx 12/21 - CT removed 12/22 - repeat 40% ptx, ct replaced 12/24 - R  VATS with resection / stapling of blebs, mechanical pleurodesis & CT placement.  Returned to ICU on vent 12/26 -20% apical PNTx, large air leak, on suction; Tachycardia   12/30 CT to water seal, cont air leak  1/02 CT chest: Moderate right PTX with right chest tube in  place. Postoperative changes in the right hemithorax with associated soft tissue in the upper right hemithorax. Severe emphysema with possible superimposed airspace disease in the right lower lobe.    INTERVAL HISTORY:    No new complaints. No distress  VITAL SIGNS: Temp:  [97.6 F (36.4 C)-98.2 F (36.8 C)] 97.6 F (36.4 C) (01/03 1430) Pulse Rate:  [71-104] 93  (01/03 1500) Resp:  [17-22] 17  (01/03  1500) BP: (112-143)/(56-79) 124/68 mmHg (01/03 1500) SpO2:  [94 %-99 %] 95 % (01/03 1500)   PHYSICAL EXAMINATION: General: NAD Neuro: no focal deficits HEENT:  WNL Cardiovascular:  RRR  Lungs: diminished BS throughout, no wheeze, air leak persists  Abdomen:  NT, soft / faint bs Ext: No edema, no cyanosis   Lab 08/27/12 0500 08/26/12 2025 08/26/12 0455  NA 141 137 140  K 3.9 4.2 3.5  CL 101 97 99  CO2 31 32 31  BUN 16 17 17   CREATININE 0.64 0.78 0.74  GLUCOSE 95 101* 95    Lab 08/27/12 0500 08/26/12 2025 08/26/12 0455  HGB 10.1* 9.3* 10.4*  HCT 31.7* 29.8* 32.5*  WBC 11.7* 11.1* 13.7*  PLT 392 381 415*    Intake/Output Summary (Last 24 hours) at 08/27/12 1954 Last data filed at 08/27/12 1740  Gross per 24 hour  Intake   1350 ml  Output      0 ml  Net   1350 ml   CXR:  NSC R apical ptx  IMPRESSION:  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  Required replacement CT on 12/22.  Underwent VATS on 12/24 with bleb stapling and CT placement.  Still has large airleak 12/28 COPD with chronic bronchitis-oxygen dependent - completed  doxycycline for AECOPD 12/23.  Chronic pred @ 20 mg/day.  Currently  without active bronchospasm;   Persistent leak reflects severe emphysema and chronic steroid thinning of connective tissue.  PLAN: - CT to water seal per CVTS but still has Significant air leak, will defer to CVTS. - Cont Prednisone 20 mg -- maint dose  - No change in BD rx  - F/U CXR.   Atrial Fibrillation  Hx of Afib - has been in sinus rhythm during this admit on rhythmol & verapamil (ER).  Mild Hypotension post op 12/24 (SBP 90's). Now resolved  Episode of non-specific anterior chest pain AM 08/25/12- watch for angina. PLAN: - Cont verapamil, rythmol, lopressor, lasix.  Stable on current regiment.    Pain In setting of post op VATS/ Chest tubes   PLAN: -prn analgesia    Vanetta Mulders, MD  [facility]  Pager [phone] After 5:30 PM or weekends, call [phone]

## 2012-08-28 ENCOUNTER — Inpatient Hospital Stay (HOSPITAL_COMMUNITY): Payer: Medicare Other

## 2012-08-28 MED ORDER — TIOTROPIUM BROMIDE MONOHYDRATE 18 MCG IN CAPS
18.0000 ug | ORAL_CAPSULE | Freq: Every day | RESPIRATORY_TRACT | Status: DC
  Administered 2012-08-28 – 2012-08-31 (×4): 18 ug via RESPIRATORY_TRACT
  Filled 2012-08-28: qty 5

## 2012-08-28 NOTE — Progress Notes (Addendum)
                    [address]            [name] [address]          [phone]     1 Day Post-Op Procedure(s) (LRB): VIDEO BRONCHOSCOPY (N/A)  Subjective: Feels well, breathing stable.   Objective: Vital signs in last 24 hours: Patient Vitals for the past 24 hrs:  BP Temp Temp src Pulse Resp SpO2  08/28/12 0537 106/68 mmHg 97.8 F (36.6 C) Oral 87  19  97 %  08/28/12 0145 - - - - - 97 %  08/27/12 2110 110/57 mmHg 98.4 F (36.9 C) Oral 99  18  95 %  08/27/12 2056 - - - - - 94 %  08/27/12 1500 124/68 mmHg - - 93  17  95 %  08/27/12 1445 131/69 mmHg - - 86  18  96 %  08/27/12 1430 143/79 mmHg 97.6 F (36.4 C) - 89  22  99 %   Current Weight  08/22/12 160 lb 15 oz (73 kg)     Intake/Output from previous day: 01/03 0701 - 01/04 0700 In: 990 [P.O.:240; I.V.:750] Out: -     PHYSICAL EXAM:  Heart: Irr irr Lungs: Overall clear Wound: Clean and dry Chest tube: 3-4/7 continuous air leak    Lab Results: CBC: Basename 08/27/12 0500 08/26/12 2025  WBC 11.7* 11.1*  HGB 10.1* 9.3*  HCT 31.7* 29.8*  PLT 392 381   BMET:  Basename 08/27/12 0500 08/26/12 2025  NA 141 137  K 3.9 4.2  CL 101 97  CO2 31 32  GLUCOSE 95 101*  BUN 16 17  CREATININE 0.64 0.78  CALCIUM 8.7 9.0    PT/INR:  Basename 08/26/12 2025  LABPROT 13.3  INR 1.02   CXR: stable, small apical ptx, ? Small basilar component as well   Assessment/Plan: S/P Procedure(s) (LRB): VIDEO BRONCHOSCOPY (N/A) Persistent but stable air leak.  EBVs unable to be placed yesterday.  Continue CT to water seal.  ?consider switching CT to [facility]. Pulm- at baseline.  Continue IS/pulm toilet.    LOS: 18 days    COLLINS,GINA H 08/28/2012    Chart reviewed, patient examined, agree with above. CXR looks ok with small ptx. Continue chest tube to water seal for now and let PVT or EBG decide about mini-express Monday.

## 2012-08-28 NOTE — Progress Notes (Signed)
Physical Therapy Treatment Patient Details Name: Mark [name] MRN: [id number] DOB: 19-Feb-1941 Today's Date: 08/28/2012 Time: 4098-1191 PT Time Calculation (min): 14 min  PT Assessment / Plan / Recommendation Comments on Treatment Session  Continues to improve with mobility/ambulation.  Patient with chest tube in place.  Will complete stairs at next session if CT removed.    Follow Up Recommendations  Home health PT;Supervision/Assistance - 24 hour     Does the patient have the potential to tolerate intense rehabilitation     Barriers to Discharge        Equipment Recommendations  None recommended by PT    Recommendations for Other Services    Frequency Min 3X/week   Plan Discharge plan remains appropriate;Frequency remains appropriate    Precautions / Restrictions Precautions Precautions: Fall Precaution Comments: right chest tube Restrictions Weight Bearing Restrictions: No   Pertinent Vitals/Pain O2 sats at 90% with ambulation on 4 l/min O2.    Mobility  Bed Mobility Bed Mobility: Supine to Sit;Sitting - Scoot to Edge of Bed Supine to Sit: 5: Supervision;With rails Sitting - Scoot to Edge of Bed: 5: Supervision;With rail Details for Bed Mobility Assistance: No cues needed. Supervision for safety. Transfers Transfers: Sit to Stand;Stand to Sit Sit to Stand: 5: Supervision;With upper extremity assist;From bed Stand to Sit: 5: Supervision;To bed Details for Transfer Assistance: Verbal cues for safety and hand placement Ambulation/Gait Ambulation/Gait Assistance: 5: Supervision Ambulation Distance (Feet): 350 Feet Assistive device:  (Pushed wheelchair) Ambulation/Gait Assistance Details: Patient with upright posture.  RLE with decreased coordination/control during swing phase - steppage gait.  Verbal reminders for pursed-lip breathing.  Ambulated on 4 l/min O2 with O2 sat at 90% during gait. Gait Pattern: Step-through pattern;Right foot flat;Right steppage Gait  velocity: WFL General Gait Details: O2 on 2 l/min during gait.  O2 sat at 90%.  No rest breaks needed. Stairs: No      PT Goals Acute Rehab PT Goals PT Goal: Supine/Side to Sit - Progress: Progressing toward goal PT Goal: Sit to Stand - Progress: Progressing toward goal PT Goal: Ambulate - Progress: Progressing toward goal  Visit Information  Last PT Received On: 08/28/12 Assistance Needed: +1    Subjective Data  Subjective: "I walked earlier, but I'm ready to go again."   Cognition  Overall Cognitive Status: Appears within functional limits for tasks assessed/performed Arousal/Alertness: Awake/alert Orientation Level: Appears intact for tasks assessed Behavior During Session: [facility] for tasks performed    Balance     End of Session PT - End of Session Equipment Utilized During Treatment: Gait belt;Oxygen Activity Tolerance: Patient tolerated treatment well Patient left: in bed;with call bell/phone within reach (sitting on EOB) Nurse Communication: Mobility status (O2 sat)   GP     Vena Austria 08/28/2012, 3:15 PM Durenda Hurt. Renaldo Fiddler, [facility] Acute Rehab Services Pager [phone]

## 2012-08-28 NOTE — Progress Notes (Signed)
PULMONARY  / CRITICAL CARE MEDICINE  Name: Mark [name] MRN: [id number] DOB: 12/23/1940    LOS: 18  REFERRING PROVIDER:  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  Found to have moderate R pneumothorax.  CT placed and admitted for further care.  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   LINES / TUBES: R CT  12/17 >>>12/21 Repeat R CT 12/22>>> ETT 12/24>>>12/25 R [place] dual lumen12/24 >> 12/29 RUE PICC 12/29 >>    CULTURES: MRSA screen 12/18 > negative  ANTIBIOTICS: 12/16 Doxycycline (AECOPD) > 12/23  12/24 Zinacef X 1 12/26/ Fortaz    SIGNIFICANT EVENTS:  12/16 - started on doxy for worsening SOB, decreased sats 12/17 - Acute office visit for above, found to have R basilar 20% PTX 12/19 - residual small apical ptx, no airleak 12/20 - CT on water seal??  Patient took off suction to go to bathroom, lung with tiny residual ptx 12/21 - CT removed 12/22 - repeat 40% ptx, ct replaced 12/24 - R  VATS with resection / stapling of blebs, mechanical pleurodesis & CT placement.  Returned to ICU on vent 12/26 -20% apical PNTx, large air leak, on suction; Tachycardia   12/30 CT to water seal, cont air leak  1/02 CT chest: Moderate right PTX with right chest tube in  place. Postoperative changes in the right hemithorax with associated soft tissue in the upper right hemithorax. Severe emphysema with possible superimposed airspace disease in the right lower lobe.    INTERVAL HISTORY:    No new complaints. No distress  VITAL SIGNS: Temp:  [97.6 F (36.4 C)-98.4 F (36.9 C)] 97.8 F (36.6 C) (01/04 0537) Pulse Rate:  [86-99] 87  (01/04 0537) Resp:  [17-22] 19  (01/04  0537) BP: (106-143)/(57-79) 106/68 mmHg (01/04 0537) SpO2:  [94 %-99 %] 97 % (01/04 0537) FIO2  3lpm   PHYSICAL EXAMINATION: General: NAD Neuro: no focal deficits HEENT:  WNL Cardiovascular:  RRR  Lungs: diminished BS throughout, no wheeze, air leak persists  Abdomen:  NT, soft / faint bs Ext: No edema, no cyanosis   Lab 08/27/12 0500 08/26/12 2025 08/26/12 0455  NA 141 137 140  K 3.9 4.2 3.5  CL 101 97 99  CO2 31 32 31  BUN 16 17 17   CREATININE 0.64 0.78 0.74  GLUCOSE 95 101* 95    Lab 08/27/12 0500 08/26/12 2025 08/26/12 0455  HGB 10.1* 9.3* 10.4*  HCT 31.7* 29.8* 32.5*  WBC 11.7* 11.1* 13.7*  PLT 392 381 415*    Intake/Output Summary (Last 24 hours) at 08/28/12 1311 Last data filed at 08/28/12 1222  Gross per 24 hour  Intake   1470 ml  Output      0 ml  Net   1470 ml   CXR:  NSC R apical ptx   IMPRESSION:  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  Required replacement CT on 12/22.  Underwent VATS on 12/24 with bleb stapling and CT placement.  Still has large airleak 12/28 COPD with chronic bronchitis-oxygen dependent - completed  doxycycline for AECOPD 12/23.  Chronic pred @  20 mg/day.  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   PLAN: - CT to water seal per CVTS but still has Significant air leak, will defer to CVTS. - Cont Prednisone 20 mg -- maint dose  - Added spiriva 1/4  - F/U CXR.   Atrial Fibrillation  Hx of Afib - has been in sinus rhythm during this admit on rhythmol & verapamil (ER).  Mild Hypotension post op 12/24 (SBP 90's). Now resolved  Episode of non-specific anterior chest pain AM 08/25/12- watch for angina. PLAN: - Cont verapamil, rythmol, lopressor, lasix.  Stable on current regiment.    Pain In setting of post op  VATS/ Chest tubes  PLAN: -prn analgesia   Sandrea Hughs, MD Pulmonary and Critical Care Medicine [facility] Cell [phone]

## 2012-08-28 NOTE — Progress Notes (Signed)
Patient ambulated 250 ft using wheelchair x 1 assist=, 02 at 4L. Tolerated well.

## 2012-08-29 ENCOUNTER — Inpatient Hospital Stay (HOSPITAL_COMMUNITY): Payer: Medicare Other

## 2012-08-29 LAB — CULTURE, RESPIRATORY W GRAM STAIN
Culture: NO GROWTH
Gram Stain: NONE SEEN

## 2012-08-29 NOTE — Progress Notes (Signed)
Pt up ambulating in hallway independently at this time; will cont. To monitor. 

## 2012-08-29 NOTE — Anesthesia Postprocedure Evaluation (Signed)
  Anesthesia Post-op Note  Patient: Mark [name]  Procedure(s) Performed: Procedure(s) (LRB) with comments: VIDEO BRONCHOSCOPY (N/A) - evaluation of endobronchial valves  Patient Location: 2000  Anesthesia Type:General  Level of Consciousness: awake and alert   Airway and Oxygen Therapy: Patient Spontanous Breathing  Post-op Pain: none  Post-op Assessment: Post-op Vital signs reviewed  Post-op Vital Signs: Reviewed  Complications: No apparent anesthesia complications

## 2012-08-29 NOTE — Progress Notes (Addendum)
                    [address]            [place] [address]          [phone]     2 Days Post-Op Procedure(s) (LRB): VIDEO BRONCHOSCOPY (N/A)  Subjective: Walked a good bit this am.  Breathing stable.   Objective: Vital signs in last 24 hours: Patient Vitals for the past 24 hrs:  BP Temp Temp src Pulse Resp SpO2 Weight  08/29/12 0850 - - - - - 96 % -  08/29/12 0354 - - - - - 94 % -  08/29/12 0341 102/58 mmHg 97.8 F (36.6 C) Oral 66  18  95 % 161 lb (73.029 kg)  08/28/12 2106 - - - - - 96 % -  08/28/12 2041 119/52 mmHg 97.6 F (36.4 C) Oral 79  18  98 % -  08/28/12 2004 111/54 mmHg 97.7 F (36.5 C) Oral 85  18  96 % -  08/28/12 1500 - - - - - 90 % -  08/28/12 1438 127/50 mmHg 98.2 F (36.8 C) Oral 88  18  95 % -   Current Weight  08/29/12 161 lb (73.029 kg)     Intake/Output from previous day: 01/04 0701 - 01/05 0700 In: 1060 [P.O.:960; IV Piggyback:100] Out: 550 [Urine:550]    PHYSICAL EXAM:  Heart: RRR Lungs: Clear Wound: Clean and dry Chest tube: + large air leak    Lab Results: CBC: Basename 08/27/12 0500 08/26/12 2025  WBC 11.7* 11.1*  HGB 10.1* 9.3*  HCT 31.7* 29.8*  PLT 392 381   BMET:  Basename 08/27/12 0500 08/26/12 2025  NA 141 137  K 3.9 4.2  CL 101 97  CO2 31 32  GLUCOSE 95 101*  BUN 16 17  CREATININE 0.64 0.78  CALCIUM 8.7 9.0    PT/INR:  Basename 08/26/12 2025  LABPROT 13.3  INR 1.02   CXR: Findings: Mass-like consolidation of the superior segment right  lower lobe with upward retraction of the minor fissure again noted.  Right-sided chest tube in place. Right-sided PICC line at  cavoatrial junction. Small right apical pneumothorax is stable.  Lung is grossly clear. Heart size normal.  IMPRESSION:  Stable small right pneumothorax.     Assessment/Plan: S/P Procedure(s) (LRB): VIDEO BRONCHOSCOPY (N/A) Persistent air leak. Continue CT to water seal. ?consider switching CT to [facility].    Pulm- at baseline. Continue IS/pulm toilet.    LOS: 19 days    COLLINS,GINA H 08/29/2012    Chart reviewed, patient examined, agree with above. He is doing well but still has significant air leak. CXR looks stable with small right ptx. Will discuss further management with Dr. Maren Beach.

## 2012-08-29 NOTE — Progress Notes (Signed)
PULMONARY  / CRITICAL CARE MEDICINE  Name: RAMOND DARNELL MRN: [id number] DOB: 08/03/1941    LOS: 19  REFERRING PROVIDER:  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  Found to have moderate R pneumothorax.  CT placed and admitted for further care.  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   LINES / TUBES: R CT  12/17 >>>12/21 Repeat R CT 12/22>>> ETT 12/24>>>12/25 R [place] dual lumen12/24 >> 12/29 RUE PICC 12/29 >>    CULTURES: MRSA screen 12/18 > negative  ANTIBIOTICS: 12/16 Doxycycline (AECOPD) > 12/23  12/24 Zinacef X 1 12/26/ Fortaz  >>   SIGNIFICANT EVENTS:  12/16 - started on doxy for worsening SOB, decreased sats 12/17 - Acute office visit for above, found to have R basilar 20% PTX 12/19 - residual small apical ptx, no airleak 12/20 - CT on water seal??  Patient took off suction to go to bathroom, lung with tiny residual ptx 12/21 - CT removed 12/22 - repeat 40% ptx, ct replaced 12/24 - R  VATS with resection / stapling of blebs, mechanical pleurodesis & CT placement.  Returned to ICU on vent 12/26 -20% apical PNTx, large air leak, on suction; Tachycardia   12/30 CT to water seal, cont air leak  1/02 CT chest: Moderate right PTX with right chest tube in  place. Postoperative changes in the right hemithorax with associated soft tissue in the upper right hemithorax. Severe emphysema with possible superimposed airspace disease in the right lower lobe.    INTERVAL HISTORY:    No new complaints. No distress  VITAL SIGNS: Temp:  [97.3 F (36.3 C)-97.8 F (36.6 C)] 97.3 F (36.3 C) (01/05 1528) Pulse Rate:  [66-98] 98  (01/05 1528) Resp:  [16-18] 16  (01/05  1528) BP: (102-119)/(52-66) 116/66 mmHg (01/05 1528) SpO2:  [94 %-98 %] 94 % (01/05 1528) Weight:  [161 lb (73.029 kg)] 161 lb (73.029 kg) (01/05 0341) FIO2  3lpm   PHYSICAL EXAMINATION: General: NAD Neuro: no focal deficits HEENT:  WNL Cardiovascular:  RRR  Lungs: diminished BS throughout, no wheeze, air leak persists  Abdomen:  NT, soft / faint bs Ext: No edema, no cyanosis   Lab 08/27/12 0500 08/26/12 2025 08/26/12 0455  NA 141 137 140  K 3.9 4.2 3.5  CL 101 97 99  CO2 31 32 31  BUN 16 17 17   CREATININE 0.64 0.78 0.74  GLUCOSE 95 101* 95    Lab 08/27/12 0500 08/26/12 2025 08/26/12 0455  HGB 10.1* 9.3* 10.4*  HCT 31.7* 29.8* 32.5*  WBC 11.7* 11.1* 13.7*  PLT 392 381 415*    Intake/Output Summary (Last 24 hours) at 08/29/12 1640 Last data filed at 08/29/12 1405  Gross per 24 hour  Intake   1110 ml  Output    300 ml  Net    810 ml   CXR:  1/5 Stable small right pneumothorax.    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  Required replacement CT on 12/22.  Underwent VATS on 12/24 with bleb stapling and CT placement.  Still has large airleak 12/28 COPD with  chronic bronchitis-oxygen dependent - completed  doxycycline for AECOPD 12/23.  Chronic pred @ 20 mg/day.  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   PLAN: - CT > will defer to CVTS. - Cont Prednisone 20 mg -- maint dose  - Added spiriva 1/4  - F/U CXR.   Atrial Fibrillation  Hx of Afib - has been in sinus rhythm during this admit on rhythmol & verapamil (ER).  Mild Hypotension post op 12/24 (SBP 90's). Now resolved  Episode of non-specific anterior chest pain AM 08/25/12- watch for angina. PLAN: - Cont verapamil, rythmol, lopressor, lasix.  Stable on current  regiment.    Pain In setting of post op VATS/ Chest tubes  PLAN: -prn analgesia     Sandrea Hughs, MD Pulmonary and Critical Care Medicine [facility] [phone]

## 2012-08-29 NOTE — Progress Notes (Signed)
Pt ambulated 550 feet independently in hallway at this time; will cont. To monitor.

## 2012-08-29 NOTE — Progress Notes (Signed)
Pt up ambulating in hallway at this time independently; will cont. To monitor.

## 2012-08-30 ENCOUNTER — Other Ambulatory Visit: Payer: Self-pay | Admitting: *Deleted

## 2012-08-30 ENCOUNTER — Inpatient Hospital Stay (HOSPITAL_COMMUNITY): Payer: Medicare Other

## 2012-08-30 ENCOUNTER — Encounter (HOSPITAL_COMMUNITY): Payer: Self-pay | Admitting: Cardiothoracic Surgery

## 2012-08-30 DIAGNOSIS — J939 Pneumothorax, unspecified: Secondary | ICD-10-CM

## 2012-08-30 MED ORDER — GUAIFENESIN ER 600 MG PO TB12
600.0000 mg | ORAL_TABLET | Freq: Two times a day (BID) | ORAL | Status: DC
  Administered 2012-08-30 – 2012-08-31 (×3): 600 mg via ORAL
  Filled 2012-08-30 (×5): qty 1

## 2012-08-30 MED ORDER — TRAMADOL HCL 50 MG PO TABS: 50.0000 mg | ORAL_TABLET | Freq: Four times a day (QID) | ORAL | Status: DC | PRN

## 2012-08-30 MED ORDER — LEVOFLOXACIN 750 MG PO TABS
750.0000 mg | ORAL_TABLET | Freq: Every day | ORAL | Status: DC
  Administered 2012-08-30 – 2012-08-31 (×2): 750 mg via ORAL
  Filled 2012-08-30 (×2): qty 1

## 2012-08-30 NOTE — Progress Notes (Signed)
Physical Therapy Treatment Patient Details Name: Mark [name] MRN: [id number] DOB: 18-Dec-1940 Today's Date: 08/30/2012 Time: 0952-1006 PT Time Calculation (min): 14 min  PT Assessment / Plan / Recommendation Comments on Treatment Session  Patient reports to bathroom on his own and multiple trips down hall pushing wheelchair for equipment management.  May benefit further from skilled PT to work on dynamic high level balance activities.  Otherwise has meet other long term goals (save stairs due to chest tube; to be downgraded to more portable device later today per MD)    Follow Up Recommendations  Home health PT                 Equipment Recommendations  None recommended by PT        Frequency Min 3X/week   Plan Discharge plan remains appropriate    Precautions / Restrictions Precautions Precaution Comments: right chest tube; oxygen dependent   Pertinent Vitals/Pain Denies pain; SpO2 87-89% ambulating on 4L O2 (up to 93% after seated rest)    Mobility  Bed Mobility Supine to Sit: 7: Independent Transfers Sit to Stand: 6: Modified independent (Device/Increase time) Stand to Sit: 6: Modified independent (Device/Increase time) Ambulation/Gait Ambulation/Gait Assistance: 5: Supervision Ambulation Distance (Feet): 300 Feet Assistive device:  (pushing wheelchair) Ambulation/Gait Assistance Details: ambulated with 4L O2 during ambulation with SpO2 87-88% up to 93% seated rest x 30 seconds    Exercises       PT Goals Acute Rehab PT Goals Pt will go Supine/Side to Sit: Independently PT Goal: Supine/Side to Sit - Progress: Met Pt will go Sit to Stand: Independently PT Goal: Sit to Stand - Progress: Met Pt will Ambulate: 51 - 150 feet;with least restrictive assistive device;with modified independence PT Goal: Ambulate - Progress: Progressing toward goal Pt will Go Up / Down Stairs: 1-2 stairs;with supervision;with least restrictive assistive device PT Goal: Up/Down Stairs -  Progress: Progressing toward goal  Visit Information  Last PT Received On: 08/30/12    Subjective Data  Subjective: Just walked to waiting room.  Willing to go again.  Not worried about steps, only have one to get in.   Cognition  Overall Cognitive Status: Appears within functional limits for tasks assessed/performed Arousal/Alertness: Awake/alert Behavior During Session: [facility] for tasks performed    Balance  Dynamic Standing Balance Dynamic Standing - Balance Support: No upper extremity supported Dynamic Standing - Level of Assistance: 5: Stand by assistance Dynamic Standing - Balance Activities: Other (comment) Dynamic Standing - Comments: moving from bed to chair about 3' away carrying chest tube container without loss of balance  End of Session PT - End of Session Activity Tolerance: Patient tolerated treatment well Patient left: in chair;with family/visitor present (MD in room changing dressings)   GP     [facility] 08/30/2012, 10:35 AM Sheran Lawless, PT [phone] 08/30/2012

## 2012-08-30 NOTE — Progress Notes (Signed)
PULMONARY  / CRITICAL CARE MEDICINE  Name: Mark [name] MRN: [id number] DOB: 04/22/1941    LOS: 20  REFERRING PROVIDER:  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  Found to have moderate R pneumothorax.  CT placed and admitted for further care.  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   LINES / TUBES: R CT  12/17 >>>12/21 Repeat R CT 12/22>>> ETT 12/24>>>12/25 R [place] dual lumen12/24 >> 12/29 RUE PICC 12/29 >>    CULTURES: MRSA screen 12/18 > negative  ANTIBIOTICS: 12/16 Doxycycline (AECOPD) > 12/23  12/24 Zinacef X 1 12/26/ Fortaz  >>   SIGNIFICANT EVENTS:  12/16 - started on doxy for worsening SOB, decreased sats 12/17 - Acute office visit for above, found to have R basilar 20% PTX 12/19 - residual small apical ptx, no airleak 12/20 - CT on water seal??  Patient took off suction to go to bathroom, lung with tiny residual ptx 12/21 - CT removed 12/22 - repeat 40% ptx, ct replaced 12/24 - R  VATS with resection / stapling of blebs, mechanical pleurodesis & CT placement.  Returned to ICU on vent 12/26 -20% apical PNTx, large air leak, on suction; Tachycardia   12/30 CT to water seal, cont air leak  1/02 CT chest: Moderate right PTX with right chest tube in place. Postoperative changes in the right hemithorax with associated soft tissue in the upper right hemithorax. Severe emphysema with possible superimposed airspace disease in the right lower lobe.    INTERVAL HISTORY:    No new complaints. No distress Sitting up - mini express  VITAL SIGNS: Temp:  [97.9 F (36.6 C)-98.2 F (36.8 C)] 97.9 F (36.6 C) (01/06 1345) Pulse Rate:  [77-99] 86  (01/06  1345) Resp:  [18] 18  (01/06 1345) BP: (96-123)/(57-68) 96/57 mmHg (01/06 1345) SpO2:  [94 %-97 %] 95 % (01/06 1520) Weight:  [162 lb 3.2 oz (73.573 kg)] 162 lb 3.2 oz (73.573 kg) (01/06 0457)    PHYSICAL EXAMINATION: General: NAD Neuro: no focal deficits HEENT:  WNL Cardiovascular:  RRR  Lungs: diminished BS throughout, no wheeze, air leak persists  Abdomen:  NT, soft / faint bs Ext: No edema, no cyanosis   Lab 08/27/12 0500 08/26/12 2025 08/26/12 0455  NA 141 137 140  K 3.9 4.2 3.5  CL 101 97 99  CO2 31 32 31  BUN 16 17 17   CREATININE 0.64 0.78 0.74  GLUCOSE 95 101* 95    Lab 08/27/12 0500 08/26/12 2025 08/26/12 0455  HGB 10.1* 9.3* 10.4*  HCT 31.7* 29.8* 32.5*  WBC 11.7* 11.1* 13.7*  PLT 392 381 415*    Intake/Output Summary (Last 24 hours) at 08/30/12 1534 Last data filed at 08/30/12 1300  Gross per 24 hour  Intake    480 ml  Output      0 ml  Net    480 ml   CXR:  1/6 Stable small right pneumothorax.    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  Required replacement CT on 12/22.  Underwent VATS on 12/24 with bleb stapling and CT  placement.  Still has large airleak 12/28 COPD with chronic bronchitis-oxygen dependent - completed  doxycycline for AECOPD 12/23.  Chronic pred @ 20 mg/day.  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   PLAN: - dc home planned with mini express per  CVTS. - Cont Prednisone 20 mg -- maint dose  - Added spiriva 1/4     Atrial Fibrillation  Hx of Afib - has been in sinus rhythm during this admit on rhythmol & verapamil (ER).  Mild Hypotension post op 12/24 (SBP 90's). Now resolved  Episode of non-specific anterior chest pain AM 08/25/12- watch for angina. PLAN: - Cont verapamil, rythmol, lopressor,  lasix.  Stable on current regiment.    Pain In setting of post op VATS/ Chest tubes  PLAN: -prn analgesia   Defer dc timing to TCTS  ALVA,RAKESH V.

## 2012-08-30 NOTE — Progress Notes (Signed)
Changed pt's pleurivac to mini express. Pt tolerated well.

## 2012-08-30 NOTE — Progress Notes (Addendum)
                   [address]            [place] [address]          [phone]    3 Days Post-Op Procedure(s) (LRB): VIDEO BRONCHOSCOPY (N/A)  Subjective: Patient without complaints  Objective: Vital signs in last 24 hours: Temp:  [97.3 F (36.3 C)-98.2 F (36.8 C)] 98.2 F (36.8 C) (01/06 0457) Pulse Rate:  [77-99] 77  (01/06 0457) Cardiac Rhythm:  [-] Normal sinus rhythm (01/05 2040) Resp:  [16-18] 18  (01/06 0457) BP: (108-123)/(62-68) 108/62 mmHg (01/06 0457) SpO2:  [94 %-97 %] 97 % (01/06 0457) Weight:  [73.573 kg (162 lb 3.2 oz)] 73.573 kg (162 lb 3.2 oz) (01/06 0457)     Intake/Output from previous day: 01/05 0701 - 01/06 0700 In: 770 [P.O.:720; IV Piggyback:50] Out: 0    Physical Exam:  Cardiovascular: RRR Pulmonary: Mostly clear to auscultation on left;coarse breath sounds on right Abdomen: Soft, non tender, bowel sounds present. Wounds: Clean and dry.  No erythema or signs of infection. Chest Tube: to water seal, +4 air leak  Lab Results: CBC:No results found for this basename: WBC:2,HGB:2,HCT:2,PLT:2 in the last 72 hours BMET: No results found for this basename: NA:2,K:2,CL:2,CO2:2,GLUCOSE:2,BUN:2,CREATININE:2,CALCIUM:2 in the last 72 hours  PT/INR: No results found for this basename: LABPROT,INR in the last 72 hours ABG:  INR: Will add last result for INR, ABG once components are confirmed Will add last 4 CBG results once components are confirmed  Assessment/Plan:  1. CV - SR. History of afib. On Verapamil CR 240 daily and Rythmol 225 bid (as taken pre op) 2.  Pulmonary - Chest tube with scant output last 24 hours and still with +4 air leak. CXR this am appears to be stable (right ptx appears decreased). Possibly change chest tube to a mini express. Dr. Donata Clay to evaluate this am. On 3 liters via [place] and was on oxygen prior to admission 3.Mucinex for cough    ZIMMERMAN,DONIELLE MPA-C 08/30/2012,7:33 AM   L lung remains well  expanded with small basilar pneumothorax. Will convert  pleurovac to Min iexpress and observe- chest tube in good position. Plan to DC home with tube/ Min iexpress system. Not a candidate for another thoracotomy at this time and talc pleurodesis will not be beneficial and may make things worse. Will need HHN services to assist with home chest tube

## 2012-08-31 ENCOUNTER — Inpatient Hospital Stay (HOSPITAL_COMMUNITY): Payer: Medicare Other

## 2012-08-31 ENCOUNTER — Telehealth: Payer: Self-pay | Admitting: Internal Medicine

## 2012-08-31 DIAGNOSIS — Z9889 Other specified postprocedural states: Secondary | ICD-10-CM

## 2012-08-31 MED ORDER — LEVOFLOXACIN 750 MG PO TABS: 750.0000 mg | ORAL_TABLET | Freq: Every day | ORAL | Status: DC

## 2012-08-31 MED ORDER — MOXIFLOXACIN HCL 400 MG PO TABS: 400.0000 mg | ORAL_TABLET | Freq: Every day | ORAL | Status: DC

## 2012-08-31 NOTE — Progress Notes (Signed)
Right Arm PICC Line discontinued as ordered. Line intact at 48 cm. Manual pressure held x 5 minutes. No active bleeding noted. Vaseline gauze pressure dressing applied and secured. Pt. Teaching performed.

## 2012-08-31 NOTE — Discharge Summary (Signed)
Physician Discharge Summary  Patient ID: Mark [name] MRN: [id number] DOB/AGE: 72-Dec-1942 72 y.o.  Admit date: 08/10/2012 Discharge date: 08/31/2012  Admission Diagnoses:  Patient Active Problem List  Diagnosis  . HYPERLIPIDEMIA  . Atrial fibrillation  . ALLERGIC RHINITIS  . COPD with chronic bronchitis-oxygen dependent  . OSTEOPENIA  . WEAKNESS  . TREMOR  . NUMBNESS, HAND  . HEADACHE  . DYSPNEA  . Spontaneous pneumothorax  . Emphysema   Discharge Diagnoses:   Patient Active Problem List  Diagnosis  . HYPERLIPIDEMIA  . Atrial fibrillation  . ALLERGIC RHINITIS  . COPD with chronic bronchitis-oxygen dependent  . OSTEOPENIA  . WEAKNESS  . TREMOR  . HEADACHE  . DYSPNEA  . Spontaneous pneumothorax  . Emphysema  . S/P thoracotomy   Discharged Condition: good  History of Present Illness:   Mr. Mark [name] is a 72 yo male with chronic Atrial Fibrillation and  end stage COPD with chronic home oxygen use.  He presented to the Emergency Department with an acute onset of shortness of breath worsening over the previous 3 days and decreased oxygen saturations.  Chest xray obtained showed the patient to be suffering from a large right sided pneumothorax.  The patient was admitted to he Critical Care service for further intervention.  Hospital Course:   Upon admission he underwent chest tube placement with full re-expansion of his right lung.  He tolerated the procedure without difficulty.  Patient initially did well and his chest tube was removed, with chest xray showing a small right sided apical pneumothorax post removal.  However, he again developed worsening shortness of breath requiring increased oxygen.  Repeat CXR was obtained and showed a recurrent large right sided pneumothorax.  The patient again underwent chest tube placement with improvement of current symptoms and pneumothorax.  It was felt that being the pneumothorax re-occurred he would benefit from a Thoracic consult for  possible VATs procedure.  He was evaluated by Dr. Donata Clay on 08/16/2012 at which time it was felt the patient would benefit from a VATS with stapling of blebs and pleurodesis.  The risks and benefits of the procedure were explained to the patient and he was agreeable to proceed.  Patient underwent CT scan of the chest which confirmed the presence of a blebs in the right lung along with a tension pneumothorax.  It was felt the chest tube in place was most likely non-functioning.  The patient was transferred to the ICU and underwent repeat Chest tube placement with good re-expansion of the lung.  The patient was taken to the operating room on 08/17/2012.  He underwent Right VATS with stapling of blebs and pleurodesis.  He also underwent Mini Thoracotomy for control of air leak present at staple line.  The patient tolerated the procedure well and was transferred back to the SICU intubated and in stable condition.  The patient was extubated the day after surgery.  His anterior chest tube was removed without difficulty.  His respiratory status has continued to improve and has been managed by the Pulmonary service.   His post operative course has been complicated by persistent air leak from his posterior chest tube.  The patient underwent Bronchoscopy for possible placement of bronchial valves, however no specific site could be identified as a source for the air leak.  Therefore, no valves were placed.  This chest tube has been placed to a Mini Express system.  The air leak remains present.  Half of the sutures from his Thoracotomy remain  in place and we will arrange removal at our office for the remaining.  Patient continues to require oxygen at 3L via [place], but he was on oxygen prior to admission.  His chest xray shows a stable right apical and basilar pneumothorax.  The patient is medically stable at this time.  We will plan to discharge the patient in the next 24-48 hours with his chest tube in place.  He will need to  follow up with Dr. Donata Clay on 09/09/2011 at 12:00 with a chest xray prior to his appointment.  He will also need to follow up with Pulmonology as scheduled.         Consults: pulmonary/intensive care  Treatments: surgery:   1. Right VATS (video-assisted thoracoscopic surgery), stapling of  blebs, pleurodesis.  2. Right mini thoracotomy for control of air leak from staple line.  3. Placement of On-Q wound irrigation pain system.  Bronchoscopy:   In preparation for procedure, bronchoscope lubricated and Patient underwent general endotracheal anesthesia.  Sedation: General endotracheal anesthesia Under general endotracheal anesthesia the video bronchoscope was passed through the endotracheal tube significant secretions were noted in the right middle and lower lobes these were suctioned and removed and sent for Gram stain and culture. The procedure was to attempt to isolate the air leak. With a #5 Fogarty balloon in a serial manner an obstructed upper lobe lower lobe and middle lobe bronchi and noted the degree of change in air leak. Thorough examination and multiple attempts we were unable to isolate the air leak to any specific rhonchi or some bronchi, with the suggestion that the air leak was from multiple lobes we decided not to proceed with placement of intra bronchial valves   Disposition:  Home Health  Discharge Orders    Future Appointments: Provider: Department: Dept Phone: Center:   09/08/2012 12:00 PM [name], MD [facility] [place] [phone] [facility]   12/09/2012 10:45 AM Waymon Budge, MD [facility] [phone] None       Medication List     As of 08/31/2012  8:52 AM    STOP taking these medications         doxycycline 100 MG tablet   Commonly known as: VIBRA-TABS      TAKE these medications         acetaminophen 325 MG tablet   Commonly known as: TYLENOL   Take 650 mg by mouth every 6 (six) hours as needed. For  pain      aspirin EC 81 MG tablet   Take 1 tablet (81 mg total) by mouth daily.      budesonide-formoterol 160-4.5 MCG/ACT inhaler   Commonly known as: SYMBICORT   Inhale 2 puffs into the lungs 2 (two) times daily. Rinse mouth      CENTRUM SILVER PO   Take 1 tablet by mouth daily.      EPINEPHrine 0.3 mg/0.3 mL Devi   Commonly known as: EPI-PEN   Inject 0.3 mg into the skin daily as needed. For anaphylaxis      guaiFENesin 600 MG 12 hr tablet   Commonly known as: MUCINEX   Take 1-2 mg by mouth every 12 (twelve) hours as needed. For cough      ipratropium-albuterol 0.5-2.5 (3) MG/3ML Soln   Commonly known as: DUONEB   Take 3 mLs by nebulization 4 (four) times daily as needed.      levofloxacin 750 MG tablet   Commonly known as: LEVAQUIN  Take 1 tablet (750 mg total) by mouth daily.      magnesium oxide 400 MG tablet   Commonly known as: MAG-OX   Take 1 tablet (400 mg total) by mouth daily.      ONE-A-DAY CALCIUM PLUS 500-50-100 MG-MG-UNIT Chew   Generic drug: Calcium-Magnesium-Vitamin D   Chew 1 tablet by mouth 2 (two) times daily.      predniSONE 20 MG tablet   Commonly known as: DELTASONE   Take 20 mg by mouth daily.      PROAIR HFA 108 (90 BASE) MCG/ACT inhaler   Generic drug: albuterol   Inhale 2 puffs into the lungs every 4 (four) hours as needed. For shortness of breath/wheezing      albuterol (2.5 MG/3ML) 0.083% nebulizer solution   Commonly known as: PROVENTIL   Take 3 mLs (2.5 mg total) by nebulization every 6 (six) hours as needed for wheezing.      propafenone 225 MG tablet   Commonly known as: RYTHMOL   Take 225 mg by mouth 2 (two) times daily.      sodium chloride 0.65 % nasal spray   Commonly known as: OCEAN   1 spray by Nasal route 2 (two) times daily.      traMADol 50 MG tablet   Commonly known as: ULTRAM   Take 1-2 tablets (50-100 mg total) by mouth every 6 (six) hours as needed.      verapamil 240 MG CR tablet   Commonly known as:  CALAN-SR   Take 1 tablet (240 mg total) by mouth at bedtime.           Follow-up Information    Follow up with VAN Dinah Beers, MD. (PA/LAT CXR to be taken (at [facility] which is in the same building as Dr. Zenaida Niece Trigt's office) on 09/08/2012 at 11:00 am;Appointment with Dr. Donata Clay is on 09/08/2012 at 12:00 pm)    Contact information:   [address] [employer] [address] [place] [address] [phone]       Follow up with Waymon Budge, MD. (Call for follow up for 4 weeks)    Contact information:   [address] [facility] [place] [address] [phone]          Signed: Lowella Dandy 08/31/2012, 8:52 AM

## 2012-08-31 NOTE — Progress Notes (Signed)
MD Donata Clay removed every other suture on pt's right side. MD states will remove the rest at his follow up visit.

## 2012-08-31 NOTE — Telephone Encounter (Signed)
Pt's wife returned call.  Appt is not needed for 4-6 wks per pt's discharge instructions.  Made appt w/ CY on 09/28/12 @ 3:00 pm.  Pt's wife verbalized understanding & stated nothing further needed at this time.  Antionette Fairy

## 2012-08-31 NOTE — Telephone Encounter (Signed)
ATC NA 

## 2012-08-31 NOTE — Progress Notes (Signed)
Discharge instructions given to pt along with prescriptions. Pt verbalized understanding. Re-dressed pt's chest tube dressing prior to discharge. Pt is stable for discharge with family. Will call for volunteers to wheel pt out.

## 2012-08-31 NOTE — Progress Notes (Addendum)
                   [address]            [place] [address]          [phone]    4 Days Post-Op Procedure(s) (LRB): VIDEO BRONCHOSCOPY (N/A)  Subjective: Patient without complaints  Objective: Vital signs in last 24 hours: Temp:  [97.8 F (36.6 C)-98 F (36.7 C)] 97.8 F (36.6 C) (01/07 0510) Pulse Rate:  [84-100] 84  (01/07 0510) Cardiac Rhythm:  [-] Sinus tachycardia (01/06 2045) Resp:  [18] 18  (01/07 0510) BP: (96-113)/(50-64) 113/64 mmHg (01/07 0510) SpO2:  [94 %-96 %] 96 % (01/07 0510)     Intake/Output from previous day: 01/06 0701 - 01/07 0700 In: 1200 [P.O.:1200] Out: 70 [Chest Tube:70]   Physical Exam:  Cardiovascular: RRR Pulmonary: Mostly clear to auscultation on left;coarse breath sounds on right Abdomen: Soft, non tender, bowel sounds present. Wounds: Clean and dry.  No erythema or signs of infection. Chest Tube: to mini express and with an  air leak  Lab Results: CBC:No results found for this basename: WBC:2,HGB:2,HCT:2,PLT:2 in the last 72 hours BMET: No results found for this basename: NA:2,K:2,CL:2,CO2:2,GLUCOSE:2,BUN:2,CREATININE:2,CALCIUM:2 in the last 72 hours  PT/INR: No results found for this basename: LABPROT,INR in the last 72 hours ABG:  INR: Will add last result for INR, ABG once components are confirmed Will add last 4 CBG results once components are confirmed  Assessment/Plan:  1. CV - SR. History of afib. On Verapamil CR 240 daily and Rythmol 225 bid (as taken pre op) 2.  Pulmonary - Chest tube with 70 cct last 24 hours.CXR this am appears to be relatively stable (right apical and basilar ptx ). Chest tube is to a mini express and has an air leak. Dr. Donata Clay to evaluate this am. On 3 liters via [place] and was on oxygen prior to admission 3.Mucinex for cough 4.Remove sutures 5.Remove PICC line 6.Will discuss discharge disposition with Dr. Donata Clay   ZIMMERMAN,DONIELLE MPA-C 08/31/2012,7:15 AM   patient  examined and medical record reviewed,agree with above note.Every other skin suture removed, incisions healing. Some xantochromic drainage around chest tube site will need daily/prn dressing changes.  Will DC pt to home with mini express, po avelox, mucinex and HHN. Return for office visit with CXR in 1 week VAN TRIGT III,PETER 08/31/2012

## 2012-08-31 NOTE — Progress Notes (Signed)
DC with mini-express is planned for today. OK from our standpoint. Pulm meds - symbicort bid , duonebs q6h prn & prednisone 20 mg Can FU with Dr Maple Hudson in 4-6 wks once surgical issues resolved  Mark [name] V.  [phone]

## 2012-09-01 DIAGNOSIS — J438 Other emphysema: Secondary | ICD-10-CM

## 2012-09-01 NOTE — Anesthesia Postprocedure Evaluation (Signed)
  Anesthesia Post-op Note  Patient: Mark [name]  Procedure(s) Performed: Procedure(s) (LRB) with comments: VIDEO BRONCHOSCOPY (N/A) - evaluation of endobronchial valves  Patient Location: PACU  Anesthesia Type:General  Level of Consciousness: awake and alert   Airway and Oxygen Therapy: Patient Spontanous Breathing  Post-op Pain: mild  Post-op Assessment: Post-op Vital signs reviewed  Post-op Vital Signs: Reviewed and stable  Complications: No apparent anesthesia complications

## 2012-09-03 NOTE — Progress Notes (Signed)
David Simonds, MD ; PCCM service Mobile [phone].  After 5:30 PM or weekends, call [phone]  

## 2012-09-08 ENCOUNTER — Encounter: Payer: Self-pay | Admitting: Cardiothoracic Surgery

## 2012-09-08 ENCOUNTER — Ambulatory Visit (INDEPENDENT_AMBULATORY_CARE_PROVIDER_SITE_OTHER): Payer: Self-pay | Admitting: Cardiothoracic Surgery

## 2012-09-08 ENCOUNTER — Ambulatory Visit
Admission: RE | Admit: 2012-09-08 | Discharge: 2012-09-08 | Disposition: A | Discharge status: Home / Self Care | Payer: Medicare Other | Source: Ambulatory Visit | Attending: Cardiothoracic Surgery | Admitting: Cardiothoracic Surgery

## 2012-09-08 VITALS — BP 124/63 | HR 104 | Temp 97.1°F | Resp 20 | Ht 71.0 in | Wt 162.0 lb

## 2012-09-08 DIAGNOSIS — J438 Other emphysema: Secondary | ICD-10-CM

## 2012-09-08 DIAGNOSIS — Z09 Encounter for follow-up examination after completed treatment for conditions other than malignant neoplasm: Secondary | ICD-10-CM

## 2012-09-08 DIAGNOSIS — J939 Pneumothorax, unspecified: Secondary | ICD-10-CM

## 2012-09-08 DIAGNOSIS — Z9889 Other specified postprocedural states: Secondary | ICD-10-CM

## 2012-09-08 DIAGNOSIS — J439 Emphysema, unspecified: Secondary | ICD-10-CM

## 2012-09-08 DIAGNOSIS — J9383 Other pneumothorax: Secondary | ICD-10-CM

## 2012-09-08 NOTE — Progress Notes (Signed)
PCP is [name], MD Referring Provider is [name], *  Chief Complaint  Patient presents with  . 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 The patient had a persistent large airleak from extremely poor and almost transparent lung tissue which required stapling to remove large blebs. He was discharged home with a right chest tube to a mini express chamber is maintaining his oxygen saturations on 3 L nasal cannula. His chest x-ray today shows a basilar or subpulmonic pneumothorax, slightly increased from that noted in the hospital. He is managing with this. He is proximally 100 cc of drainage into the mini express chamber daily. A home health nurses checking him 3 days a week.  Today I pulled back his chest tube approximately 2 inches as appeared somewhat medial on the chest x-ray. He still has a air leak. The patient has end-stage lung disease and a fairly poor prognosis and the wife understands the situation. Is not a candidate for further surgery of his lung he to its extremely thin and fragile nature from long-term steroids and COPD bullous emphysema  Past Medical History  Diagnosis Date  . Atrial fibrillation   . Hyperlipidemia   . Weakness   . Dyspnea   . Headache   . Osteopenia   . COPD (chronic obstructive pulmonary disease)   . Allergic rhinitis     Past Surgical History  Procedure Date  . Catheter ablation   . Cataract extraction, bilateral   . Appendectomy   . Inguinal hernia repair   . Video assisted thoracoscopy (vats)/thorocotomy 08/17/2012    resection / stapling of blebs, mechanical pleurodesis   . Video assisted thoracoscopy 08/17/2012   Procedure: VIDEO ASSISTED THORACOSCOPY;  Surgeon: [name], MD;  Location: [facility] OR;  Service: Thoracic;  Laterality: Right;  . Resection of apical bleb 08/17/2012    Procedure: RESECTION OF APICAL BLEB;  Surgeon: [name], MD;  Location: [facility] OR;  Service: Thoracic;  Laterality: Right;  stapling of bleb  . Video bronchoscopy 08/27/2012    Procedure: VIDEO BRONCHOSCOPY;  Surgeon: [name], MD;  Location: [facility] OR;  Service: Thoracic;  Laterality: N/A;  evaluation of endobronchial valves    Family History  Problem Relation Age of Onset  . Heart attack Father   . COPD Brother     deceased  . COPD Sister   . COPD Father   . Lung cancer Father     Social History History  Substance Use Topics  . Smoking status: Former Smoker -- 3.0 packs/day    Types: Cigarettes  . Smokeless tobacco: Former Neurosurgeon    Quit date: 09/01/1965  . Alcohol Use: No    Current Outpatient Prescriptions  Medication Sig Dispense Refill  . acetaminophen (TYLENOL) 325 MG tablet Take 650 mg by mouth every 6 (six) hours as needed. For pain      . albuterol (PROAIR HFA) 108 (90 BASE) MCG/ACT inhaler Inhale 2 puffs into the lungs every 4 (four) hours as needed. For shortness of breath/wheezing      . albuterol (PROVENTIL) (2.5 MG/3ML) 0.083% nebulizer solution Take 3 mLs (2.5 mg total) by nebulization every 6 (six) hours as needed  for wheezing.  75 mL  12  . alendronate (FOSAMAX) 70 MG tablet Take 70 mg by mouth every 7 (seven) days. Take with a full glass of water on an empty stomach.      Marland Kitchen aspirin EC 81 MG tablet Take 1 tablet (81 mg total) by mouth daily.  150 tablet  2  . budesonide-formoterol (SYMBICORT) 160-4.5 MCG/ACT inhaler Inhale 2 puffs into the lungs 2 (two) times daily. Rinse mouth  3 Inhaler  3  . Calcium-Magnesium-Vitamin D (ONE-A-DAY CALCIUM PLUS) 500-50-100 MG-MG-UNIT CHEW Chew 1 tablet by mouth 2 (two) times daily.        Marland Kitchen EPINEPHrine (EPI-PEN) 0.3 mg/0.3 mL DEVI Inject 0.3 mg into the  skin daily as needed. For anaphylaxis      . guaiFENesin (MUCINEX) 600 MG 12 hr tablet Take 1-2 mg by mouth every 12 (twelve) hours as needed. For cough      . ipratropium-albuterol (DUONEB) 0.5-2.5 (3) MG/3ML SOLN Take 3 mLs by nebulization 4 (four) times daily as needed.  360 mL  11  . levofloxacin (LEVAQUIN) 750 MG tablet Take 1 tablet (750 mg total) by mouth daily. For 10 days then stop  10 tablet  0  . magnesium oxide (MAG-OX) 400 MG tablet Take 1 tablet (400 mg total) by mouth daily.      . Multiple Vitamins-Minerals (CENTRUM SILVER PO) Take 1 tablet by mouth daily.        . predniSONE (DELTASONE) 20 MG tablet Take 20 mg by mouth daily.      . propafenone (RYTHMOL) 225 MG tablet Take 225 mg by mouth 2 (two) times daily.      . sodium chloride (OCEAN) 0.65 % nasal spray 1 spray by Nasal route 2 (two) times daily.        . traMADol (ULTRAM) 50 MG tablet Take 1-2 tablets (50-100 mg total) by mouth every 6 (six) hours as needed.  40 tablet  0  . verapamil (CALAN-SR) 240 MG CR tablet Take 1 tablet (240 mg total) by mouth at bedtime.  30 tablet  9    Allergies  Allergen Reactions  . Shrimp (Shellfish Allergy)   . Zolpidem Tartrate     Review of Systems patient happy to be at home  BP 124/63  Pulse 104  Temp 97.1 F (36.2 C) (Oral)  Resp 20  Ht 5\' 11"  (1.803 m)  Wt 162 lb (73.483 kg)  BMI 22.59 kg/m2  SpO2 91% Physical Exam Diminished breath sounds bilaterally Airleak noted the remaining rest Heart rate slightly elevated Patient is comfortable in no respiratory distress Diagnostic Tests: Chest x-ray shows a basilar pneumothorax, stable chest tube in place Right chest tube site clean chest tube withdrawn 1-2 inches and then resutured to the skin and a dressing applied Impression: Continue current chest tube drainage at home  Plan: Return with chest x-ray one week continue home health nursing for chest tube maintenance care

## 2012-09-09 NOTE — Discharge Summary (Signed)
patient examined and medical record reviewed,agree with above note. VAN TRIGT III,Kennet Mccort 09/09/2012    

## 2012-09-13 ENCOUNTER — Other Ambulatory Visit: Payer: Self-pay | Admitting: *Deleted

## 2012-09-13 DIAGNOSIS — J449 Chronic obstructive pulmonary disease, unspecified: Secondary | ICD-10-CM

## 2012-09-15 ENCOUNTER — Ambulatory Visit
Admission: RE | Admit: 2012-09-15 | Discharge: 2012-09-15 | Disposition: A | Discharge status: Home / Self Care | Payer: Medicare Other | Source: Ambulatory Visit | Attending: Cardiothoracic Surgery | Admitting: Cardiothoracic Surgery

## 2012-09-15 ENCOUNTER — Ambulatory Visit (INDEPENDENT_AMBULATORY_CARE_PROVIDER_SITE_OTHER): Payer: Self-pay | Admitting: Cardiothoracic Surgery

## 2012-09-15 ENCOUNTER — Other Ambulatory Visit: Payer: Self-pay | Admitting: Internal Medicine

## 2012-09-15 ENCOUNTER — Encounter: Payer: Self-pay | Admitting: Cardiothoracic Surgery

## 2012-09-15 VITALS — BP 126/65 | HR 108 | Resp 20 | Ht 71.0 in | Wt 162.0 lb

## 2012-09-15 DIAGNOSIS — J438 Other emphysema: Secondary | ICD-10-CM

## 2012-09-15 DIAGNOSIS — J449 Chronic obstructive pulmonary disease, unspecified: Secondary | ICD-10-CM

## 2012-09-15 DIAGNOSIS — J9383 Other pneumothorax: Secondary | ICD-10-CM

## 2012-09-15 DIAGNOSIS — Z9889 Other specified postprocedural states: Secondary | ICD-10-CM

## 2012-09-15 DIAGNOSIS — Z09 Encounter for follow-up examination after completed treatment for conditions other than malignant neoplasm: Secondary | ICD-10-CM

## 2012-09-15 MED ORDER — IPRATROPIUM-ALBUTEROL 0.5-2.5 (3) MG/3ML IN SOLN: 3.0000 mL | Freq: Four times a day (QID) | RESPIRATORY_TRACT | Status: DC | PRN

## 2012-09-15 MED ORDER — ALBUTEROL SULFATE (2.5 MG/3ML) 0.083% IN NEBU: 2.5000 mg | INHALATION_SOLUTION | Freq: Four times a day (QID) | RESPIRATORY_TRACT | Status: DC | PRN

## 2012-09-15 NOTE — Progress Notes (Signed)
PCP is [name], MD Referring Provider is [name], *  Chief Complaint  Patient presents with  . Routine Post Op    1 week f/u with CXR Chest tube drainage at home, S/P Rt VATS, stapling of blebs fro recurrent spontaneous pneumothorax      HPI: 72 year old gentleman with end-stage COPD home oxygen returns with a chest tube after having right VATS resection of blebs and persistent air leak at the staple line from his very thin and transparent lungs Maintaining oxygen saturation at 3 L greater than 90% Home health nurse comes twice a week to drain the mini expressed and change the dressing. He has completed his course of oral Levaquin. He is eating well and supplementing his meals with in sure He remains on prednisone 20 mg a day as directed by his medical physicians   Past Medical History  Diagnosis Date  . Atrial fibrillation   . Hyperlipidemia   . Weakness   . Dyspnea   . Headache   . Osteopenia   . COPD (chronic obstructive pulmonary disease)   . Allergic rhinitis     Past Surgical History  Procedure Date  . Catheter ablation   . Cataract extraction, bilateral   . Appendectomy   . Inguinal hernia repair   . Video assisted thoracoscopy (vats)/thorocotomy 08/17/2012    resection / stapling of blebs, mechanical pleurodesis   . Video assisted thoracoscopy 08/17/2012    Procedure: VIDEO ASSISTED THORACOSCOPY;  Surgeon: [name], MD;  Location: [facility] OR;  Service: Thoracic;  Laterality: Right;  . Resection of apical bleb 08/17/2012    Procedure: RESECTION OF APICAL BLEB;  Surgeon: [name], MD;  Location: [facility] OR;  Service: Thoracic;  Laterality: Right;  stapling of bleb  . Video bronchoscopy 08/27/2012    Procedure: VIDEO BRONCHOSCOPY;  Surgeon: [name], MD;  Location: [facility] OR;  Service: Thoracic;  Laterality: N/A;  evaluation of endobronchial valves    Family History  Problem Relation Age of Onset  . Heart attack Father   . COPD  Brother     deceased  . COPD Sister   . COPD Father   . Lung cancer Father     Social History History  Substance Use Topics  . Smoking status: Former Smoker -- 3.0 packs/day    Types: Cigarettes  . Smokeless tobacco: Former Neurosurgeon    Quit date: 09/01/1965  . Alcohol Use: No    Current Outpatient Prescriptions  Medication Sig Dispense Refill  . acetaminophen (TYLENOL) 325 MG tablet Take 650 mg by mouth every 6 (six) hours as needed. For pain      . albuterol (PROAIR HFA) 108 (90 BASE) MCG/ACT inhaler Inhale 2 puffs into the lungs every 4 (four) hours as needed. For shortness of breath/wheezing      . alendronate (FOSAMAX) 70 MG tablet Take 70 mg by mouth every 7 (seven) days. Take with a full glass of water on an empty stomach.      Marland Kitchen aspirin EC 81 MG tablet Take 1 tablet (81 mg total) by mouth daily.  150 tablet  2  . budesonide-formoterol (SYMBICORT) 160-4.5 MCG/ACT inhaler Inhale 2 puffs into the lungs 2 (two) times daily. Rinse mouth  3 Inhaler  3  . Calcium-Magnesium-Vitamin D (ONE-A-DAY CALCIUM PLUS) 500-50-100 MG-MG-UNIT CHEW Chew 1 tablet by mouth 2 (two) times daily.        Marland Kitchen EPINEPHrine (EPI-PEN) 0.3 mg/0.3 mL DEVI Inject 0.3 mg into the skin daily  as needed. For anaphylaxis      . guaiFENesin (MUCINEX) 600 MG 12 hr tablet Take 1-2 mg by mouth every 12 (twelve) hours as needed. For cough      . ipratropium-albuterol (DUONEB) 0.5-2.5 (3) MG/3ML SOLN Take 3 mLs by nebulization 4 (four) times daily as needed.  360 mL  11  . magnesium oxide (MAG-OX) 400 MG tablet Take 1 tablet (400 mg total) by mouth daily.      . Multiple Vitamins-Minerals (CENTRUM SILVER PO) Take 1 tablet by mouth daily.        . predniSONE (DELTASONE) 20 MG tablet Take 20 mg by mouth daily.      . propafenone (RYTHMOL) 225 MG tablet Take 225 mg by mouth 2 (two) times daily.      . traMADol (ULTRAM) 50 MG tablet Take 1-2 tablets (50-100 mg total) by mouth every 6 (six) hours as needed.  40 tablet  0  . verapamil  (CALAN-SR) 240 MG CR tablet Take 1 tablet (240 mg total) by mouth at bedtime.  30 tablet  9    Allergies  Allergen Reactions  . Shrimp (Shellfish Allergy)   . Zolpidem Tartrate     Review of Systems he denies fever some productive cough of whitish material  BP 126/65  Pulse 108  Resp 20  Ht 5\' 11"  (1.803 m)  Wt 162 lb (73.483 kg)  BMI 22.59 kg/m2  SpO2 91% Physical Exam Alert and comfortable On nasal cannula chest tube intact Chest tube site mild erythema no drainage or cellulitis Remainder of the skin sutures from VATS incision are removed, 100 cc of clear yellow fluid was removed from mini express chamber with syringe Chest tube is pulled back 1 inch and secured to the skin with a 0 silk suture, new dressing applied  Diagnostic Tests: Chest x-ray today shows reduced pneumothorax at the right base chest tube in good position  Impression: Persistent air leak, improved pneumothorax following right VATS and resection of blebs in end-stage emphysematous lungs  Plan: Continue chest tube management, return to office in one week with chest x-ray

## 2012-09-15 NOTE — Telephone Encounter (Signed)
Albuterol 2.5mg  use 1 vial 4 times a day  Ipratropium 2.76ml <> use 1 vial 4 times day Faxed over to [place]

## 2012-09-17 ENCOUNTER — Other Ambulatory Visit: Payer: Self-pay | Admitting: *Deleted

## 2012-09-17 DIAGNOSIS — J939 Pneumothorax, unspecified: Secondary | ICD-10-CM

## 2012-09-22 ENCOUNTER — Ambulatory Visit
Admission: RE | Admit: 2012-09-22 | Discharge: 2012-09-22 | Disposition: A | Discharge status: Home / Self Care | Payer: Medicare Other | Source: Ambulatory Visit | Attending: Cardiothoracic Surgery | Admitting: Cardiothoracic Surgery

## 2012-09-22 ENCOUNTER — Ambulatory Visit (INDEPENDENT_AMBULATORY_CARE_PROVIDER_SITE_OTHER): Payer: Medicare Other | Admitting: Cardiothoracic Surgery

## 2012-09-22 ENCOUNTER — Encounter: Payer: Self-pay | Admitting: Cardiothoracic Surgery

## 2012-09-22 VITALS — BP 124/69 | HR 112 | Resp 22 | Ht 71.0 in | Wt 162.0 lb

## 2012-09-22 DIAGNOSIS — Z9889 Other specified postprocedural states: Secondary | ICD-10-CM

## 2012-09-22 DIAGNOSIS — Z09 Encounter for follow-up examination after completed treatment for conditions other than malignant neoplasm: Secondary | ICD-10-CM

## 2012-09-22 DIAGNOSIS — J939 Pneumothorax, unspecified: Secondary | ICD-10-CM

## 2012-09-22 DIAGNOSIS — J438 Other emphysema: Secondary | ICD-10-CM

## 2012-09-22 DIAGNOSIS — J9383 Other pneumothorax: Secondary | ICD-10-CM

## 2012-09-22 NOTE — Progress Notes (Signed)
PCP is [name], MD Referring Provider is [name], *  Chief Complaint  Patient presents with  . Routine Post Op    1 week f/u with CXR, eval persistent air leak S/P Rt VATS and resection of blebs    HPI: Persistent airleak following right VATS and bleb resection for recurrent spontaneous pneumothorax, end stage emphysema on home oxygen Chest tube in place connected to mini express, drainage of xanthochromic fluid approximately 300 mL per week Airleak through mini express is slightly improved Patient coughing up colored phlegm, O2 sats remained greater than 90% on 2 or 3 L per minute nasal cannula home oxygen   Past Medical History  Diagnosis Date  . Atrial fibrillation   . Hyperlipidemia   . Weakness   . Dyspnea   . Headache   . Osteopenia   . COPD (chronic obstructive pulmonary disease)   . Allergic rhinitis     Past Surgical History  Procedure Date  . Catheter ablation   . Cataract extraction, bilateral   . Appendectomy   . Inguinal hernia repair   . Video assisted thoracoscopy (vats)/thorocotomy 08/17/2012    resection / stapling of blebs, mechanical pleurodesis   . Video assisted thoracoscopy 08/17/2012    Procedure: VIDEO ASSISTED THORACOSCOPY;  Surgeon: [name], MD;  Location: [facility] OR;  Service: Thoracic;  Laterality: Right;  . Resection of apical bleb 08/17/2012    Procedure: RESECTION OF APICAL BLEB;  Surgeon: [name], MD;  Location: [facility] OR;  Service: Thoracic;  Laterality: Right;  stapling of bleb  . Video bronchoscopy 08/27/2012    Procedure: VIDEO BRONCHOSCOPY;  Surgeon: [name], MD;  Location: [facility] OR;  Service: Thoracic;  Laterality: N/A;  evaluation of endobronchial valves    Family History  Problem Relation Age of Onset  . Heart attack Father   . COPD Brother     deceased  . COPD Sister   . COPD Father   . Lung cancer Father     Social History History  Substance Use Topics  . Smoking status: Former  Smoker -- 3.0 packs/day    Types: Cigarettes  . Smokeless tobacco: Former Neurosurgeon    Quit date: 09/01/1965  . Alcohol Use: No    Current Outpatient Prescriptions  Medication Sig Dispense Refill  . acetaminophen (TYLENOL) 325 MG tablet Take 650 mg by mouth every 6 (six) hours as needed. For pain      . albuterol (PROAIR HFA) 108 (90 BASE) MCG/ACT inhaler Inhale 2 puffs into the lungs every 4 (four) hours as needed. For shortness of breath/wheezing      . albuterol (PROVENTIL) (2.5 MG/3ML) 0.083% nebulizer solution Take 3 mLs (2.5 mg total) by nebulization every 6 (six) hours as needed for wheezing.  75 mL  12  . alendronate (FOSAMAX) 70 MG tablet Take 70 mg by mouth every 7 (seven) days. Take with a full glass of water on an empty stomach.      Marland Kitchen aspirin EC 81 MG tablet Take 1 tablet (81 mg total) by mouth daily.  150 tablet  2  . budesonide-formoterol (SYMBICORT) 160-4.5 MCG/ACT inhaler Inhale 2 puffs into the lungs 2 (two) times daily. Rinse mouth  3 Inhaler  3  . Calcium-Magnesium-Vitamin D (ONE-A-DAY CALCIUM PLUS) 500-50-100 MG-MG-UNIT CHEW Chew 1 tablet by mouth 2 (two) times daily.        Marland Kitchen EPINEPHrine (EPI-PEN) 0.3 mg/0.3 mL DEVI Inject 0.3 mg into the skin daily as needed. For anaphylaxis      .  guaiFENesin (MUCINEX) 600 MG 12 hr tablet Take 1-2 mg by mouth every 12 (twelve) hours as needed. For cough      . ipratropium-albuterol (DUONEB) 0.5-2.5 (3) MG/3ML SOLN Take 3 mLs by nebulization 4 (four) times daily as needed.  360 mL  11  . magnesium oxide (MAG-OX) 400 MG tablet Take 1 tablet (400 mg total) by mouth daily.      . Multiple Vitamins-Minerals (CENTRUM SILVER PO) Take 1 tablet by mouth daily.        . predniSONE (DELTASONE) 20 MG tablet Take 20 mg by mouth daily.      . propafenone (RYTHMOL) 225 MG tablet Take 225 mg by mouth 2 (two) times daily.      . traMADol (ULTRAM) 50 MG tablet Take 1-2 tablets (50-100 mg total) by mouth every 6 (six) hours as needed.  40 tablet  0  .  verapamil (CALAN-SR) 240 MG CR tablet Take 1 tablet (240 mg total) by mouth at bedtime.  30 tablet  9    Allergies  Allergen Reactions  . Shrimp (Shellfish Allergy)   . Zolpidem Tartrate     Review of Systems patient denies fever  BP 124/69  Pulse 112  Resp 22  Ht 5\' 11"  (1.803 m)  Wt 162 lb (73.483 kg)  BMI 22.59 kg/m2  SpO2 93% Physical Exam Baseline labored breathing Distant breath sounds bilaterally Chest tube site with slight erythema Chest tube advanced 1 inch and resutured to skin with 0 silk suture and dry dressing applied 100 ML's of xanthochromic fluid removed from the expressed chamber  Diagnostic Tests: Chest x-ray shows a very small apical pneumothorax, improved from last week  Impression: Persistent air leak, continue home chest tube drainage system with mini express  Plan: Return in one week for chest x-ray Levaquin 500 mg per day for this colored sputum, erythema around chest tube site

## 2012-09-27 ENCOUNTER — Other Ambulatory Visit: Payer: Self-pay | Admitting: *Deleted

## 2012-09-27 DIAGNOSIS — J449 Chronic obstructive pulmonary disease, unspecified: Secondary | ICD-10-CM

## 2012-09-27 DIAGNOSIS — J939 Pneumothorax, unspecified: Secondary | ICD-10-CM

## 2012-09-28 ENCOUNTER — Encounter: Payer: Self-pay | Admitting: Internal Medicine

## 2012-09-28 ENCOUNTER — Ambulatory Visit (INDEPENDENT_AMBULATORY_CARE_PROVIDER_SITE_OTHER): Payer: Medicare Other | Admitting: Internal Medicine

## 2012-09-28 VITALS — BP 118/64 | HR 115 | Ht 71.0 in | Wt 168.8 lb

## 2012-09-28 DIAGNOSIS — R918 Other nonspecific abnormal finding of lung field: Secondary | ICD-10-CM

## 2012-09-28 DIAGNOSIS — J449 Chronic obstructive pulmonary disease, unspecified: Secondary | ICD-10-CM

## 2012-09-28 DIAGNOSIS — R222 Localized swelling, mass and lump, trunk: Secondary | ICD-10-CM

## 2012-09-28 DIAGNOSIS — J9383 Other pneumothorax: Secondary | ICD-10-CM

## 2012-09-28 MED ORDER — PREDNISONE 5 MG PO TABS: ORAL_TABLET | ORAL | Status: DC

## 2012-09-28 NOTE — Patient Instructions (Addendum)
Try prednisone 20 mg, alternating with 15 (3 x 5 mg), every other day for a week Then try prednisone 15 mg daily  Call after 2 weeks for update and further directions

## 2012-09-28 NOTE — Progress Notes (Signed)
Patient ID: Mark [name], male    DOB: 11-09-1940, 72 y.o.   MRN: [id number]  HPI 01/17/11- 72 yo with respiratory failure/ COPD, chronic AFib. Wife here Last here 11/04/10- Note reviewed Got a cold 2 weeks ago. Initally better. In last week-10 days has had more malaise, cough esp in last 3 days. Last antibiotic 2-3 months ago.  Went back up to 20 mg daily prednisone. Was never able to wean off last year when we tried. He has been on steroids many years..   05/07/11-  50 yo M former 3 PPD smoker with respiratory failure/ COPD, chronic AFib. Wife here No major changes. Uses nebulizer 3-5x/ day, infrequent use of rescue inhaler.Stays on prednisone 20 mg daily. He couldn't function at 15 mg daily.No recent infection. Fought off a mild cold a few weeks ago. He used his standby doxy for that and feels it helped. Daily mucinex. Daily cough with some phlegm. For flu vax today. Oxygen continuous- 1.5 - 2 L/M.  In past few weeks has noted pain down from right hip to ankle- discussed as likely pinched nerve/ compression fx from steroids. Spends much of each day sitting.   11/05/11-70 yo M former 3 PPD smoker with respiratory failure/ COPD/ steroid dependent, chronic AFib. Wife here. PCP Dr [name] Had chest cold at [facility] but finally better in past month. Remains on prednisone 20 mg daily. He has been able to be much more active, feeling better. Up and around more in house on O2 2 L/M with less need for wheelchair and with no acute issues. Denies infection, blood, chest pain. Had injections for back pain.   05/07/12- 28 yo M former 3 PPD smoker with respiratory failure/ COPD/ steroid dependent, chronic AFib.   Wife here.  PCP Dr [name] Denies any SOB, wheezing, cough, or congestion for about the past 2 weeks Remains on oxygen 3 L/[employer].  They plan to fly to [facility] for a family wedding. I filled out the airplane form. They will be taking a portable concentrator. We discussed oxygen, altitude and  exertion. He remains steroid dependent 20 mg prednisone daily.  08/10/12- 39 yo M former 3 PPD smoker with respiratory failure/ COPD/ steroid dependent, chronic AFib.   Wife here.  PCP Dr [name] ACUTE VISIT: since Sunday having a hard time keep in O2 levels up even with O2; no energy and unable to do much activites. Came back in wheelchair and O2 was 86% on 3l/m cont. Had had a cold and started doxycycline yesterday. Denies fever,  chest pain, palpitation, little cough, no blood, no edema. Some pain across upper back and shoulders.  09/28/12- 24 yo M former 3 PPD smoker with respiratory failure/ COPD/ steroid dependent, chronic AFib.   Wife here.  Post Hospital: No more than usual SOB or wheezing since being in hospital prolonged stay 12/17-1/7 for prolonged R spontaneous PTX with chest tube.  Chest tube is still in with a drain, followed by Dr Donata Clay. Breathing is better. Antibiotics are finished. Continues oxygen 2 L. CT chest 08/26/12-  IMPRESSION:  1. Moderate right hydropneumothorax with right chest tube in  place, terminating in the anterior mediastinal region. Subcutaneous  emphysema along the right chest wall.  2. Postoperative changes in the right hemithorax with associated  soft tissue in the upper right hemithorax.  3. Centrilobular emphysema with possible superimposed airspace  disease in the right lower lobe. The latter is of unknown  chronicity and appears somewhat fibrotic.  Original Report Authenticated By: Leanna Battles, M.D.  CXR 09/29/12 IMPRESSION:  1. Stable right-sided chest tube with persistent right-sided  pneumothorax.  2. Persistent right midlung density.  3. Stable severe underlying emphysematous changes.  Original Report Authenticated By: Rudie Meyer, M.D.  Review of Systems- see HPI Constitutional:   No-   weight loss, night sweats, fevers, chills, fatigue, lassitude. HEENT:   No-  headaches, difficulty swallowing, tooth/dental problems, sore throat,        No-  sneezing, itching, ear ache, nasal congestion, post nasal drip,  CV:  No-   chest pain, orthopnea, PND, no recent swelling in lower extremities, no-anasarca, dizziness, palpitations Resp: + shortness of breath with exertion or at rest.              Little cough,  No-  coughing up of blood.              No-   change in color of mucus.  Slight wheezing.   Skin: No-   rash or lesions. GI:  No-   heartburn, indigestion, abdominal pain, nausea, vomiting, GU: No-   dysuria,  MS:  No-   joint pain or swelling.  + back pain- HPI Neuro- nothing unusual  Psych:  No- change in mood or affect. No depression or anxiety.  No memory loss.  Objective:   Physical Exam BP 118/64  Pulse 115  Ht 5\' 11"  (1.803 m)  Wt 168 lb 12.8 oz (76.567 kg)  BMI 23.55 kg/m2  SpO2 96% General- Alert, Oriented, Affect-appropriate, Distress- none acute, wheelchair, O2   2 l/m . Skin- rash-none, lesions- none, excoriation- none. Steroid fragility and echymoses Lymphadenopathy- none Head- atraumatic            Eyes- Gross vision intact, PERRLA, conjunctivae clear secretions            Ears- Hearing, canals normal            Nose- Clear, No-Septal dev, mucus, polyps, erosion, perforation             Throat- Mallampati II , mucosa clear , drainage- none, tonsils- atrophic Neck- flexible , trachea midline, no stridor , thyroid nl, carotid no bruit Chest - symmetrical excursion , unlabored           Heart/CV- almost regular to my exam (no pacemaker) , no murmur , no gallop  , no rub, nl s1 s2                            JVD- none , edema- trace, stasis changes- none, varices- none           Lung- +Very distant bilaterally, w/ diffuse slow expiratory wheeze, +loose cough , dullness-none, rub+ R.+,  Pursed lips.. Right chest tube draining yellow clear fluid. Bilateral breath sounds.           Chest wall-  Abd- Br/ Gen/ Rectal- Not done, not indicated Extrem- cyanosis- none, clubbing, none, atrophy- none, strength-  nl Neuro- grossly intact to observation

## 2012-09-29 ENCOUNTER — Ambulatory Visit
Admission: RE | Admit: 2012-09-29 | Discharge: 2012-09-29 | Disposition: A | Discharge status: Home / Self Care | Payer: Medicare Other | Source: Ambulatory Visit | Attending: Cardiothoracic Surgery | Admitting: Cardiothoracic Surgery

## 2012-09-29 ENCOUNTER — Encounter: Payer: Self-pay | Admitting: Cardiothoracic Surgery

## 2012-09-29 ENCOUNTER — Ambulatory Visit (INDEPENDENT_AMBULATORY_CARE_PROVIDER_SITE_OTHER): Payer: Self-pay | Admitting: Cardiothoracic Surgery

## 2012-09-29 VITALS — BP 106/64 | HR 118 | Resp 16 | Ht 71.0 in | Wt 168.0 lb

## 2012-09-29 DIAGNOSIS — J939 Pneumothorax, unspecified: Secondary | ICD-10-CM

## 2012-09-29 DIAGNOSIS — J9382 Other air leak: Secondary | ICD-10-CM

## 2012-09-29 DIAGNOSIS — Z9889 Other specified postprocedural states: Secondary | ICD-10-CM

## 2012-09-29 DIAGNOSIS — J9383 Other pneumothorax: Secondary | ICD-10-CM

## 2012-09-29 DIAGNOSIS — Z09 Encounter for follow-up examination after completed treatment for conditions other than malignant neoplasm: Secondary | ICD-10-CM

## 2012-09-29 DIAGNOSIS — J449 Chronic obstructive pulmonary disease, unspecified: Secondary | ICD-10-CM

## 2012-09-29 NOTE — Progress Notes (Signed)
PCP is [name], MD Referring Provider is Merwyn Katos, MD  Chief Complaint  Patient presents with  . Routine Post Op    1 week f/u re-eval persistent air leak, mini express in place, S/P Rt VATS and resection of blebs     HPI: Patient returns for going right chest tube with chronic airleak after resection of blebs for severe emphysema and end-stage COPD Minimal productive cough. Prednisone dose being slowly tapered from 20 daily to 20 alternating with 15 mg. Slight white productive cough. No fever. No infection around the tube site. Chest tube still draining significant pleural fluid and use a mini expressed chamber and is not ready for a valve system until the fluid drainage stops. Chest x-ray today shows small apical and basilar spaces no change, chest tube in good position, no infiltrate with some postoperative changes   Past Medical History  Diagnosis Date  . Atrial fibrillation   . Hyperlipidemia   . Weakness   . Dyspnea   . Headache   . Osteopenia   . COPD (chronic obstructive pulmonary disease)   . Allergic rhinitis     Past Surgical History  Procedure Date  . Catheter ablation   . Cataract extraction, bilateral   . Appendectomy   . Inguinal hernia repair   . Video assisted thoracoscopy (vats)/thorocotomy 08/17/2012    resection / stapling of blebs, mechanical pleurodesis   . Video assisted thoracoscopy 08/17/2012    Procedure: VIDEO ASSISTED THORACOSCOPY;  Surgeon: [name], MD;  Location: [facility] OR;  Service: Thoracic;  Laterality: Right;  . Resection of apical bleb 08/17/2012    Procedure: RESECTION OF APICAL BLEB;  Surgeon: [name], MD;  Location: [facility] OR;  Service: Thoracic;  Laterality: Right;  stapling of bleb  . Video bronchoscopy 08/27/2012    Procedure: VIDEO BRONCHOSCOPY;  Surgeon: [name], MD;  Location: [facility] OR;  Service: Thoracic;  Laterality: N/A;  evaluation of endobronchial valves    Family History  Problem Relation  Age of Onset  . Heart attack Father   . COPD Brother     deceased  . COPD Sister   . COPD Father   . Lung cancer Father     Social History History  Substance Use Topics  . Smoking status: Former Smoker -- 3.0 packs/day    Types: Cigarettes  . Smokeless tobacco: Former Neurosurgeon    Quit date: 09/01/1965  . Alcohol Use: No    Current Outpatient Prescriptions  Medication Sig Dispense Refill  . acetaminophen (TYLENOL) 325 MG tablet Take 650 mg by mouth every 6 (six) hours as needed. For pain      . albuterol (PROAIR HFA) 108 (90 BASE) MCG/ACT inhaler Inhale 2 puffs into the lungs every 4 (four) hours as needed. For shortness of breath/wheezing      . albuterol (PROVENTIL) (2.5 MG/3ML) 0.083% nebulizer solution Take 3 mLs (2.5 mg total) by nebulization every 6 (six) hours as needed for wheezing.  75 mL  12  . alendronate (FOSAMAX) 70 MG tablet Take 70 mg by mouth every 7 (seven) days. Take with a full glass of water on an empty stomach.      Marland Kitchen aspirin EC 81 MG tablet Take 1 tablet (81 mg total) by mouth daily.  150 tablet  2  . budesonide-formoterol (SYMBICORT) 160-4.5 MCG/ACT inhaler Inhale 2 puffs into the lungs 2 (two) times daily. Rinse mouth  3 Inhaler  3  . Calcium-Magnesium-Vitamin D (ONE-A-DAY CALCIUM PLUS) 500-50-100  MG-MG-UNIT CHEW Chew 1 tablet by mouth 2 (two) times daily.        Marland Kitchen EPINEPHrine (EPI-PEN) 0.3 mg/0.3 mL DEVI Inject 0.3 mg into the skin daily as needed. For anaphylaxis      . guaiFENesin (MUCINEX) 600 MG 12 hr tablet Take 1-2 mg by mouth every 12 (twelve) hours as needed. For cough      . ipratropium-albuterol (DUONEB) 0.5-2.5 (3) MG/3ML SOLN Take 3 mLs by nebulization 4 (four) times daily as needed.  360 mL  11  . magnesium oxide (MAG-OX) 400 MG tablet Take 1 tablet (400 mg total) by mouth daily.      . Multiple Vitamins-Minerals (CENTRUM SILVER PO) Take 1 tablet by mouth daily.        . predniSONE (DELTASONE) 20 MG tablet Take 20 mg by mouth daily.      . predniSONE  (DELTASONE) 5 MG tablet 3 every other day, or as directed  50 tablet  2  . propafenone (RYTHMOL) 225 MG tablet Take 225 mg by mouth 2 (two) times daily.      . traMADol (ULTRAM) 50 MG tablet Take 1-2 tablets (50-100 mg total) by mouth every 6 (six) hours as needed.  40 tablet  0  . verapamil (CALAN-SR) 240 MG CR tablet Take 1 tablet (240 mg total) by mouth at bedtime.  30 tablet  9    Allergies  Allergen Reactions  . Shrimp (Shellfish Allergy)   . Zolpidem Tartrate     Review of Systems no fever weight stable postthoracotomy pain controlled with tramadol BP 106/64  Pulse 118  Resp 16  Ht 5\' 11"  (1.803 m)  Wt 168 lb (76.204 kg)  BMI 23.43 kg/m2  SpO2 92% Physical Exam Alert and comfortable Breath sounds clear Positive airleak through mini expressed chamber Chest tube site clean and dry new chest tube suture applied  Diagnostic Tests: No change in small apical and right basilar spaces, chest tube in good position  Impression: Continue with home chest tube drainage therapy of airleak  Plan: Return with chest x-ray and review in 2 weeks

## 2012-10-04 ENCOUNTER — Other Ambulatory Visit: Payer: Self-pay | Admitting: *Deleted

## 2012-10-04 DIAGNOSIS — J939 Pneumothorax, unspecified: Secondary | ICD-10-CM

## 2012-10-06 ENCOUNTER — Ambulatory Visit: Payer: Medicare Other | Admitting: Cardiothoracic Surgery

## 2012-10-07 DIAGNOSIS — R918 Other nonspecific abnormal finding of lung field: Secondary | ICD-10-CM | POA: Insufficient documentation

## 2012-10-07 NOTE — Assessment & Plan Note (Signed)
Remains oxygen dependent. Emphysema and chronic bronchitis.  Plan-try reducing prednisone to improve healing of pneumothorax. 20/ alt 15 mg x 7 days, then 15 mg daily. We discussed steroid withdrawal and adrenal insufficiency. He has been on 20 mg of prednisone for a long time.

## 2012-10-07 NOTE — Assessment & Plan Note (Signed)
Persistent pneumothorax with indwelling chest tube draining clear fluid. Management by thoracic surgery.

## 2012-10-07 NOTE — Assessment & Plan Note (Signed)
Right mid lung zone density is seen on chest x-rays and CT scan. Nonspecific, consistent with old inflammatory disease, not active process/neoplasm not ruled out. Diagnostic and therapeutic options for neoplasm would be very limited Plan thoracic surgery is already on board. We will watch this.

## 2012-10-09 LAB — AFB CULTURE WITH SMEAR (NOT AT ARMC): Acid Fast Smear: NONE SEEN

## 2012-10-12 ENCOUNTER — Other Ambulatory Visit: Payer: Self-pay | Admitting: Internal Medicine

## 2012-10-12 ENCOUNTER — Telehealth: Payer: Self-pay | Admitting: Internal Medicine

## 2012-10-12 NOTE — Telephone Encounter (Signed)
Noted  

## 2012-10-12 NOTE — Telephone Encounter (Signed)
I spoke with pt and he stated since cutting down pred 15 mg has caused his SOB to be worse. He stated he is going to stay at this dose for a few more days and see how he dose. He stated this is FYI for [name] he was suppose to call with update on how he was doing from last OV.  Last OV 09/28/12 Pending OV 10/29/12

## 2012-10-13 ENCOUNTER — Other Ambulatory Visit: Payer: Self-pay | Admitting: Cardiothoracic Surgery

## 2012-10-13 ENCOUNTER — Ambulatory Visit
Admission: RE | Admit: 2012-10-13 | Discharge: 2012-10-13 | Disposition: A | Discharge status: Home / Self Care | Payer: Medicare Other | Source: Ambulatory Visit | Attending: Cardiothoracic Surgery | Admitting: Cardiothoracic Surgery

## 2012-10-13 ENCOUNTER — Ambulatory Visit (INDEPENDENT_AMBULATORY_CARE_PROVIDER_SITE_OTHER): Payer: Self-pay | Admitting: Cardiothoracic Surgery

## 2012-10-13 ENCOUNTER — Encounter: Payer: Self-pay | Admitting: Cardiothoracic Surgery

## 2012-10-13 VITALS — BP 116/60 | HR 88 | Resp 18 | Ht 71.0 in | Wt 168.0 lb

## 2012-10-13 DIAGNOSIS — Z09 Encounter for follow-up examination after completed treatment for conditions other than malignant neoplasm: Secondary | ICD-10-CM

## 2012-10-13 DIAGNOSIS — J939 Pneumothorax, unspecified: Secondary | ICD-10-CM

## 2012-10-13 DIAGNOSIS — J9383 Other pneumothorax: Secondary | ICD-10-CM

## 2012-10-13 DIAGNOSIS — J9382 Other air leak: Secondary | ICD-10-CM

## 2012-10-13 NOTE — Patient Instructions (Signed)
Resume prednisone 15 mg daily Take Keflex 1 tablet 3 times a day

## 2012-10-13 NOTE — Progress Notes (Signed)
PCP is [name], MD Referring Provider is Merwyn Katos, MD  Chief Complaint  Patient presents with  . Routine Post Op    2 week f/u with CXR, chest tube drainage therapy of airleak, S/P  Rt VATS and resection of blebs    HPI: The patient being followed for a persistent right airleak from a bleb resection and recurrent spontaneous pneumothorax in the setting of end-stage COPD, home O2. Pleural fluid over the past 4 days has become serosanguineous. He states he had a low-grade fever. The patient states since his prednisone dose was decreased from 15-10 mg daily he is having more shortness of breath. The airleak is persistent on exam. The chest x-ray today shows small basilar and apical pneumothorax, stable, chest tube in good position  Past Medical History  Diagnosis Date  . Atrial fibrillation   . Hyperlipidemia   . Weakness   . Dyspnea   . Headache   . Osteopenia   . COPD (chronic obstructive pulmonary disease)   . Allergic rhinitis     Past Surgical History  Procedure Laterality Date  . Catheter ablation    . Cataract extraction, bilateral    . Appendectomy    . Inguinal hernia repair    . Video assisted thoracoscopy (vats)/thorocotomy  08/17/2012    resection / stapling of blebs, mechanical pleurodesis   . Video assisted thoracoscopy  08/17/2012    Procedure: VIDEO ASSISTED THORACOSCOPY;  Surgeon: [name], MD;  Location: [facility] OR;  Service: Thoracic;  Laterality: Right;  . Resection of apical bleb  08/17/2012    Procedure: RESECTION OF APICAL BLEB;  Surgeon: [name], MD;  Location: [facility] OR;  Service: Thoracic;  Laterality: Right;  stapling of bleb  . Video bronchoscopy  08/27/2012    Procedure: VIDEO BRONCHOSCOPY;  Surgeon: [name], MD;  Location: [facility] OR;  Service: Thoracic;  Laterality: N/A;  evaluation of endobronchial valves    Family History  Problem Relation Age of Onset  . Heart attack Father   . COPD Brother     deceased  . COPD  Sister   . COPD Father   . Lung cancer Father     Social History History  Substance Use Topics  . Smoking status: Former Smoker -- 3.00 packs/day    Types: Cigarettes  . Smokeless tobacco: Former Neurosurgeon    Quit date: 09/01/1965  . Alcohol Use: No    Current Outpatient Prescriptions  Medication Sig Dispense Refill  . acetaminophen (TYLENOL) 325 MG tablet Take 650 mg by mouth every 6 (six) hours as needed. For pain      . albuterol (PROAIR HFA) 108 (90 BASE) MCG/ACT inhaler Inhale 2 puffs into the lungs every 4 (four) hours as needed. For shortness of breath/wheezing      . albuterol (PROVENTIL) (2.5 MG/3ML) 0.083% nebulizer solution Take 3 mLs (2.5 mg total) by nebulization every 6 (six) hours as needed for wheezing.  75 mL  12  . alendronate (FOSAMAX) 70 MG tablet Take 70 mg by mouth every 7 (seven) days. Take with a full glass of water on an empty stomach.      Marland Kitchen aspirin EC 81 MG tablet Take 1 tablet (81 mg total) by mouth daily.  150 tablet  2  . budesonide-formoterol (SYMBICORT) 160-4.5 MCG/ACT inhaler Inhale 2 puffs into the lungs 2 (two) times daily. Rinse mouth  3 Inhaler  3  . Calcium-Magnesium-Vitamin D (ONE-A-DAY CALCIUM PLUS) 500-50-100 MG-MG-UNIT CHEW Chew 1 tablet  by mouth 2 (two) times daily.        Marland Kitchen EPINEPHrine (EPI-PEN) 0.3 mg/0.3 mL DEVI Inject 0.3 mg into the skin daily as needed. For anaphylaxis      . guaiFENesin (MUCINEX) 600 MG 12 hr tablet Take 1-2 mg by mouth every 12 (twelve) hours as needed. For cough      . ipratropium-albuterol (DUONEB) 0.5-2.5 (3) MG/3ML SOLN Take 3 mLs by nebulization 4 (four) times daily as needed.  360 mL  11  . magnesium oxide (MAG-OX) 400 MG tablet Take 1 tablet (400 mg total) by mouth daily.      . Multiple Vitamins-Minerals (CENTRUM SILVER PO) Take 1 tablet by mouth daily.        . predniSONE (DELTASONE) 20 MG tablet Take 15 mg by mouth daily.       . predniSONE (DELTASONE) 5 MG tablet 3 every other day, or as directed  50 tablet  2  .  propafenone (RYTHMOL) 225 MG tablet Take 225 mg by mouth 2 (two) times daily.      . traMADol (ULTRAM) 50 MG tablet Take 1-2 tablets (50-100 mg total) by mouth every 6 (six) hours as needed.  40 tablet  0  . verapamil (CALAN-SR) 240 MG CR tablet Take 1 tablet (240 mg total) by mouth at bedtime.  30 tablet  9  . verapamil (CALAN-SR) 240 MG CR tablet TAKE 1 TABLET BY MOUTH ONCE A DAY  30 tablet  2   No current facility-administered medications for this visit.    Allergies  Allergen Reactions  . Shrimp (Shellfish Allergy)   . Zolpidem Tartrate     Review of Systems serosanguineous pleural fluid drainage  BP 116/60  Pulse 88  Resp 18  Ht 5\' 11"  (1.803 m)  Wt 168 lb (76.204 kg)  BMI 23.44 kg/m2  SpO2 92% Physical Exam Patient slightly more breathless Lungs clear but distant breath sounds Persistent airleak the right chest tube Right chest tube site clean without drainage or cellulitis  Diagnostic Tests: Chest x-ray shows stable small basilar and apical pneumothoraces chest tube in good position  Impression: Change in pleural fluid is serosanguineous. We'll stop his aspirin for 5 days. Change in fluid followed some mild trauma to his tube. We'll cover him with oral Keflex 500 mg 3 times a day and check a pleural fluid culture. He will change his prednisone back to 15 mg a day which should help make him more comfortable which is one of our goals at this point. Return with chest x-ray for followup in one week.

## 2012-10-18 ENCOUNTER — Other Ambulatory Visit: Payer: Self-pay | Admitting: *Deleted

## 2012-10-18 DIAGNOSIS — J939 Pneumothorax, unspecified: Secondary | ICD-10-CM

## 2012-10-19 LAB — BODY FLUID CULTURE
Gram Stain: NONE SEEN
Gram Stain: NONE SEEN

## 2012-10-20 ENCOUNTER — Ambulatory Visit (INDEPENDENT_AMBULATORY_CARE_PROVIDER_SITE_OTHER): Payer: Self-pay | Admitting: Cardiothoracic Surgery

## 2012-10-20 ENCOUNTER — Ambulatory Visit
Admission: RE | Admit: 2012-10-20 | Discharge: 2012-10-20 | Disposition: A | Discharge status: Home / Self Care | Payer: Medicare Other | Source: Ambulatory Visit | Attending: Cardiothoracic Surgery | Admitting: Cardiothoracic Surgery

## 2012-10-20 ENCOUNTER — Encounter: Payer: Self-pay | Admitting: Cardiothoracic Surgery

## 2012-10-20 VITALS — BP 133/61 | HR 106 | Resp 24 | Ht 71.0 in | Wt 168.0 lb

## 2012-10-20 DIAGNOSIS — J939 Pneumothorax, unspecified: Secondary | ICD-10-CM

## 2012-10-20 DIAGNOSIS — J9383 Other pneumothorax: Secondary | ICD-10-CM

## 2012-10-20 DIAGNOSIS — Z09 Encounter for follow-up examination after completed treatment for conditions other than malignant neoplasm: Secondary | ICD-10-CM

## 2012-10-20 DIAGNOSIS — J9382 Other air leak: Secondary | ICD-10-CM

## 2012-10-20 NOTE — Progress Notes (Signed)
PCP is [name], MD Referring Provider is Merwyn Katos, MD  Chief Complaint  Patient presents with  . Routine Post Op    1 week f/u with CXR for persistent airleak of the right chest tube, S/P Rt VATS and bleb resection     HPI: One week followup for chronic indwelling right chest tube for persistent airleak after bleb resection for severe bullous emphysema and recurrent spontaneous tension pneumothorax. Pleural fluid culture positive for a skin organism, gram-negative rod. Patient has completed one week of oral Keflex. We will give another week of oral Levaquin.  Patient states he feels somewhat better after increasing his prednisone dose to 20 mg daily. He denies fever. He still has an air leak to the mini express   Past Medical History  Diagnosis Date  . Atrial fibrillation   . Hyperlipidemia   . Weakness   . Dyspnea   . Headache   . Osteopenia   . COPD (chronic obstructive pulmonary disease)   . Allergic rhinitis     Past Surgical History  Procedure Laterality Date  . Catheter ablation    . Cataract extraction, bilateral    . Appendectomy    . Inguinal hernia repair    . Video assisted thoracoscopy (vats)/thorocotomy  08/17/2012    resection / stapling of blebs, mechanical pleurodesis   . Video assisted thoracoscopy  08/17/2012    Procedure: VIDEO ASSISTED THORACOSCOPY;  Surgeon: [name], MD;  Location: [facility] OR;  Service: Thoracic;  Laterality: Right;  . Resection of apical bleb  08/17/2012    Procedure: RESECTION OF APICAL BLEB;  Surgeon: [name], MD;  Location: [facility] OR;  Service: Thoracic;  Laterality: Right;  stapling of bleb  . Video bronchoscopy  08/27/2012    Procedure: VIDEO BRONCHOSCOPY;  Surgeon: [name], MD;  Location: [facility] OR;  Service: Thoracic;  Laterality: N/A;  evaluation of endobronchial valves    Family History  Problem Relation Age of Onset  . Heart attack Father   . COPD Brother     deceased  . COPD Sister   .  COPD Father   . Lung cancer Father     Social History History  Substance Use Topics  . Smoking status: Former Smoker -- 3.00 packs/day    Types: Cigarettes  . Smokeless tobacco: Former Neurosurgeon    Quit date: 09/01/1965  . Alcohol Use: No    Current Outpatient Prescriptions  Medication Sig Dispense Refill  . albuterol (PROAIR HFA) 108 (90 BASE) MCG/ACT inhaler Inhale 2 puffs into the lungs every 4 (four) hours as needed. For shortness of breath/wheezing      . albuterol (PROVENTIL) (2.5 MG/3ML) 0.083% nebulizer solution Take 3 mLs (2.5 mg total) by nebulization every 6 (six) hours as needed for wheezing.  75 mL  12  . alendronate (FOSAMAX) 70 MG tablet Take 70 mg by mouth every 7 (seven) days. Take with a full glass of water on an empty stomach.      Marland Kitchen aspirin EC 81 MG tablet Take 1 tablet (81 mg total) by mouth daily.  150 tablet  2  . budesonide-formoterol (SYMBICORT) 160-4.5 MCG/ACT inhaler Inhale 2 puffs into the lungs 2 (two) times daily. Rinse mouth  3 Inhaler  3  . Calcium-Magnesium-Vitamin D (ONE-A-DAY CALCIUM PLUS) 500-50-100 MG-MG-UNIT CHEW Chew 1 tablet by mouth 2 (two) times daily.        . cephALEXin (KEFLEX) 500 MG capsule Take 500 mg by mouth 3 (three) times  daily.       Marland Kitchen EPINEPHrine (EPI-PEN) 0.3 mg/0.3 mL DEVI Inject 0.3 mg into the skin daily as needed. For anaphylaxis      . guaiFENesin (MUCINEX) 600 MG 12 hr tablet Take 1-2 mg by mouth every 12 (twelve) hours as needed. For cough      . ipratropium-albuterol (DUONEB) 0.5-2.5 (3) MG/3ML SOLN Take 3 mLs by nebulization 4 (four) times daily as needed.  360 mL  11  . magnesium oxide (MAG-OX) 400 MG tablet Take 1 tablet (400 mg total) by mouth daily.      . Multiple Vitamins-Minerals (CENTRUM SILVER PO) Take 1 tablet by mouth daily.        . predniSONE (DELTASONE) 20 MG tablet Take 20 mg by mouth daily.      . propafenone (RYTHMOL) 225 MG tablet Take 225 mg by mouth 2 (two) times daily.      . traMADol (ULTRAM) 50 MG tablet  Take 1-2 tablets (50-100 mg total) by mouth every 6 (six) hours as needed.  40 tablet  0  . verapamil (CALAN-SR) 240 MG CR tablet Take 1 tablet (240 mg total) by mouth at bedtime.  30 tablet  9   No current facility-administered medications for this visit.    Allergies  Allergen Reactions  . Shrimp (Shellfish Allergy)   . Zolpidem Tartrate     Review of Systems no fever still short of breath  BP 133/61  Pulse 106  Resp 24  Ht 5\' 11"  (1.803 m)  Wt 168 lb (76.204 kg)  BMI 23.44 kg/m2  SpO2 94% Physical Exam Alert and baseline short of breath Chest tube intact suture removed and new silk suture to secure the chest tube was placed under local anesthesia Serosanguineous fluid 200 cc drained from many expressed chamber Coarse rhonchi bilaterally, positive airleak  Diagnostic Tests: Chest x-ray with minimal basilar pneumothorax chest tube in good position no new infiltrate  Impression: Continue another course of oral antibiotics, Levaquin  Plan: Return in one week with chest x-ray

## 2012-10-25 ENCOUNTER — Other Ambulatory Visit: Payer: Self-pay | Admitting: *Deleted

## 2012-10-26 ENCOUNTER — Other Ambulatory Visit: Payer: Self-pay | Admitting: *Deleted

## 2012-10-26 DIAGNOSIS — J9382 Other air leak: Secondary | ICD-10-CM

## 2012-10-27 ENCOUNTER — Encounter: Payer: Self-pay | Admitting: Cardiothoracic Surgery

## 2012-10-27 ENCOUNTER — Ambulatory Visit (INDEPENDENT_AMBULATORY_CARE_PROVIDER_SITE_OTHER): Payer: Self-pay | Admitting: Cardiothoracic Surgery

## 2012-10-27 ENCOUNTER — Ambulatory Visit
Admission: RE | Admit: 2012-10-27 | Discharge: 2012-10-27 | Disposition: A | Discharge status: Home / Self Care | Payer: Medicare Other | Source: Ambulatory Visit | Attending: Cardiothoracic Surgery | Admitting: Cardiothoracic Surgery

## 2012-10-27 VITALS — BP 139/71 | HR 118 | Resp 24 | Ht 71.0 in | Wt 168.0 lb

## 2012-10-27 DIAGNOSIS — J9382 Other air leak: Secondary | ICD-10-CM

## 2012-10-27 DIAGNOSIS — J9383 Other pneumothorax: Secondary | ICD-10-CM

## 2012-10-27 DIAGNOSIS — Z09 Encounter for follow-up examination after completed treatment for conditions other than malignant neoplasm: Secondary | ICD-10-CM

## 2012-10-27 NOTE — Progress Notes (Signed)
PCP is [name], MD Referring Provider is [name], *  Chief Complaint  Patient presents with  . Routine Post Op    1 week f/u with CXR    HPI: One week visit with chest x-ray for persistent right lung airleak following urgent pulmonary resections of blebs and tension pneumothorax. Completing a 3 week course of oral antibiotics for a gram-negative species grown from pleural fluid. Currently feels well, no fever, breathing comfortably on his nasal cannula, airleak is persistent but fluid drainage from chest tube is diminishing. No erythema or infection around the chest tube site.  Chest x-ray today shows improved expansion of the right lung with less of a subpulmonic space, chest tube in good position. No pleural effusion.   Past Medical History  Diagnosis Date  . Atrial fibrillation   . Hyperlipidemia   . Weakness   . Dyspnea   . Headache   . Osteopenia   . COPD (chronic obstructive pulmonary disease)   . Allergic rhinitis     Past Surgical History  Procedure Laterality Date  . Catheter ablation    . Cataract extraction, bilateral    . Appendectomy    . Inguinal hernia repair    . Video assisted thoracoscopy (vats)/thorocotomy  08/17/2012    resection / stapling of blebs, mechanical pleurodesis   . Video assisted thoracoscopy  08/17/2012    Procedure: VIDEO ASSISTED THORACOSCOPY;  Surgeon: [name], MD;  Location: [facility] OR;  Service: Thoracic;  Laterality: Right;  . Resection of apical bleb  08/17/2012    Procedure: RESECTION OF APICAL BLEB;  Surgeon: [name], MD;  Location: [facility] OR;  Service: Thoracic;  Laterality: Right;  stapling of bleb  . Video bronchoscopy  08/27/2012    Procedure: VIDEO BRONCHOSCOPY;  Surgeon: [name], MD;  Location: [facility] OR;  Service: Thoracic;  Laterality: N/A;  evaluation of endobronchial valves    Family History  Problem Relation Age of Onset  . Heart attack Father   . COPD Brother     deceased  . COPD  Sister   . COPD Father   . Lung cancer Father     Social History History  Substance Use Topics  . Smoking status: Former Smoker -- 3.00 packs/day    Types: Cigarettes  . Smokeless tobacco: Former Neurosurgeon    Quit date: 09/01/1965  . Alcohol Use: No    Current Outpatient Prescriptions  Medication Sig Dispense Refill  . albuterol (PROAIR HFA) 108 (90 BASE) MCG/ACT inhaler Inhale 2 puffs into the lungs every 4 (four) hours as needed. For shortness of breath/wheezing      . albuterol (PROVENTIL) (2.5 MG/3ML) 0.083% nebulizer solution Take 3 mLs (2.5 mg total) by nebulization every 6 (six) hours as needed for wheezing.  75 mL  12  . alendronate (FOSAMAX) 70 MG tablet Take 70 mg by mouth every 7 (seven) days. Take with a full glass of water on an empty stomach.      Marland Kitchen aspirin EC 81 MG tablet Take 1 tablet (81 mg total) by mouth daily.  150 tablet  2  . budesonide-formoterol (SYMBICORT) 160-4.5 MCG/ACT inhaler Inhale 2 puffs into the lungs 2 (two) times daily. Rinse mouth  3 Inhaler  3  . Calcium-Magnesium-Vitamin D (ONE-A-DAY CALCIUM PLUS) 500-50-100 MG-MG-UNIT CHEW Chew 1 tablet by mouth 2 (two) times daily.        . cephALEXin (KEFLEX) 500 MG capsule Take 500 mg by mouth 3 (three) times daily.       Marland Kitchen  EPINEPHrine (EPI-PEN) 0.3 mg/0.3 mL DEVI Inject 0.3 mg into the skin daily as needed. For anaphylaxis      . guaiFENesin (MUCINEX) 600 MG 12 hr tablet Take 1-2 mg by mouth every 12 (twelve) hours as needed. For cough      . ipratropium-albuterol (DUONEB) 0.5-2.5 (3) MG/3ML SOLN Take 3 mLs by nebulization 4 (four) times daily as needed.  360 mL  11  . levofloxacin (LEVAQUIN) 500 MG tablet Take 500 mg by mouth daily.       . magnesium oxide (MAG-OX) 400 MG tablet Take 1 tablet (400 mg total) by mouth daily.      . Multiple Vitamins-Minerals (CENTRUM SILVER PO) Take 1 tablet by mouth daily.        . predniSONE (DELTASONE) 20 MG tablet Take 20 mg by mouth daily.      . propafenone (RYTHMOL) 225 MG  tablet Take 225 mg by mouth 2 (two) times daily.      . traMADol (ULTRAM) 50 MG tablet Take 1-2 tablets (50-100 mg total) by mouth every 6 (six) hours as needed.  40 tablet  0  . verapamil (CALAN-SR) 240 MG CR tablet Take 1 tablet (240 mg total) by mouth at bedtime.  30 tablet  9   No current facility-administered medications for this visit.    Allergies  Allergen Reactions  . Shrimp (Shellfish Allergy)   . Zolpidem Tartrate     Review of Systems No fever no fever or productive cough BP 139/71  Pulse 118  Resp 24  Ht 5\' 11"  (1.803 m)  Wt 168 lb (76.204 kg)  BMI 23.44 kg/m2  SpO2 95% Physical Exam Pleasant and comfortable Lungs with distant breath sounds Chest tube with persistent airleak minimal drainage into the mini expressed chamber Heart rhythm regular Chest tube site clean and dry  Diagnostic Tests: Chest x-ray reviewed showing almost full reexpansion of the right lung chest tube in good position  Impression: Controlled airleak with chest tube in place Patient on prednisone 20 mg a day for end-stage COPD Plan: Continue current course. Patient will finish his course of Levaquin. No evidence of active infection. Return for followup in 2 weeks with chest x-ray.

## 2012-10-29 ENCOUNTER — Ambulatory Visit: Payer: Medicare Other | Admitting: Internal Medicine

## 2012-10-30 ENCOUNTER — Inpatient Hospital Stay (HOSPITAL_COMMUNITY): Payer: Medicare Other

## 2012-10-30 ENCOUNTER — Inpatient Hospital Stay (HOSPITAL_COMMUNITY)
Admission: EM | Admit: 2012-10-30 | Discharge: 2012-11-04 | DRG: 855 | Disposition: A | Discharge status: Home / Self Care | Payer: Medicare Other | Attending: Family Medicine | Admitting: Family Medicine

## 2012-10-30 ENCOUNTER — Encounter (HOSPITAL_COMMUNITY): Payer: Self-pay | Admitting: Emergency Medicine

## 2012-10-30 ENCOUNTER — Emergency Department (HOSPITAL_COMMUNITY): Payer: Medicare Other

## 2012-10-30 ENCOUNTER — Telehealth: Payer: Self-pay | Admitting: Thoracic Surgery (Cardiothoracic Vascular Surgery)

## 2012-10-30 DIAGNOSIS — I4891 Unspecified atrial fibrillation: Secondary | ICD-10-CM | POA: Diagnosis present

## 2012-10-30 DIAGNOSIS — Z9889 Other specified postprocedural states: Secondary | ICD-10-CM

## 2012-10-30 DIAGNOSIS — R000 Tachycardia, unspecified: Secondary | ICD-10-CM | POA: Diagnosis present

## 2012-10-30 DIAGNOSIS — R109 Unspecified abdominal pain: Secondary | ICD-10-CM | POA: Diagnosis present

## 2012-10-30 DIAGNOSIS — A419 Sepsis, unspecified organism: Secondary | ICD-10-CM | POA: Diagnosis present

## 2012-10-30 DIAGNOSIS — J449 Chronic obstructive pulmonary disease, unspecified: Secondary | ICD-10-CM

## 2012-10-30 DIAGNOSIS — T85698D Other mechanical complication of other specified internal prosthetic devices, implants and grafts, subsequent encounter: Secondary | ICD-10-CM

## 2012-10-30 DIAGNOSIS — E785 Hyperlipidemia, unspecified: Secondary | ICD-10-CM | POA: Diagnosis present

## 2012-10-30 DIAGNOSIS — Z87891 Personal history of nicotine dependence: Secondary | ICD-10-CM

## 2012-10-30 DIAGNOSIS — D649 Anemia, unspecified: Secondary | ICD-10-CM | POA: Diagnosis present

## 2012-10-30 DIAGNOSIS — J439 Emphysema, unspecified: Secondary | ICD-10-CM | POA: Diagnosis present

## 2012-10-30 DIAGNOSIS — T85698A Other mechanical complication of other specified internal prosthetic devices, implants and grafts, initial encounter: Secondary | ICD-10-CM

## 2012-10-30 DIAGNOSIS — Z9981 Dependence on supplemental oxygen: Secondary | ICD-10-CM

## 2012-10-30 DIAGNOSIS — D72829 Elevated white blood cell count, unspecified: Secondary | ICD-10-CM | POA: Diagnosis present

## 2012-10-30 DIAGNOSIS — J4489 Other specified chronic obstructive pulmonary disease: Secondary | ICD-10-CM | POA: Diagnosis present

## 2012-10-30 LAB — URINALYSIS, ROUTINE W REFLEX MICROSCOPIC
Glucose, UA: NEGATIVE mg/dL
Leukocytes, UA: NEGATIVE
pH: 6 (ref 5.0–8.0)

## 2012-10-30 LAB — CG4 I-STAT (LACTIC ACID): Lactic Acid, Venous: 1.58 mmol/L (ref 0.5–2.2)

## 2012-10-30 LAB — CBC WITH DIFFERENTIAL/PLATELET
Basophils Absolute: 0 10*3/uL (ref 0.0–0.1)
Eosinophils Absolute: 0 10*3/uL (ref 0.0–0.7)
HCT: 41.4 % (ref 39.0–52.0)
Lymphocytes Relative: 11 % — ABNORMAL LOW (ref 12–46)
MCHC: 32.1 g/dL (ref 30.0–36.0)
Monocytes Relative: 12 % (ref 3–12)
Neutro Abs: 19.4 10*3/uL — ABNORMAL HIGH (ref 1.7–7.7)
Platelets: 341 10*3/uL (ref 150–400)
RDW: 15.4 % (ref 11.5–15.5)
WBC: 25.2 10*3/uL — ABNORMAL HIGH (ref 4.0–10.5)

## 2012-10-30 LAB — CBC
HCT: 39.7 % (ref 39.0–52.0)
MCH: 29.4 pg (ref 26.0–34.0)
MCHC: 32.2 g/dL (ref 30.0–36.0)
MCV: 91.1 fL (ref 78.0–100.0)
RDW: 15.5 % (ref 11.5–15.5)

## 2012-10-30 LAB — COMPREHENSIVE METABOLIC PANEL
AST: 12 U/L (ref 0–37)
Albumin: 3.3 g/dL — ABNORMAL LOW (ref 3.5–5.2)
Alkaline Phosphatase: 52 U/L (ref 39–117)
BUN: 21 mg/dL (ref 6–23)
Creatinine, Ser: 0.93 mg/dL (ref 0.50–1.35)
Potassium: 4.5 mEq/L (ref 3.5–5.1)
Total Protein: 7.5 g/dL (ref 6.0–8.3)

## 2012-10-30 LAB — URINE MICROSCOPIC-ADD ON

## 2012-10-30 LAB — CREATININE, SERUM: GFR calc non Af Amer: 70 mL/min — ABNORMAL LOW (ref >90)

## 2012-10-30 LAB — PRO B NATRIURETIC PEPTIDE: Pro B Natriuretic peptide (BNP): 170.2 pg/mL — ABNORMAL HIGH (ref 0–125)

## 2012-10-30 LAB — TROPONIN I: Troponin I: <0.3 ng/mL (ref <0.30)

## 2012-10-30 MED ORDER — ALBUTEROL SULFATE (5 MG/ML) 0.5% IN NEBU
2.5000 mg | INHALATION_SOLUTION | RESPIRATORY_TRACT | Status: DC | PRN
  Administered 2012-10-30 – 2012-11-03 (×4): 2.5 mg via RESPIRATORY_TRACT
  Filled 2012-10-30 (×4): qty 0.5

## 2012-10-30 MED ORDER — TRAMADOL HCL 50 MG PO TABS
50.0000 mg | ORAL_TABLET | Freq: Four times a day (QID) | ORAL | Status: DC | PRN
  Administered 2012-11-02 – 2012-11-03 (×2): 100 mg via ORAL
  Filled 2012-10-30 (×2): qty 2

## 2012-10-30 MED ORDER — IPRATROPIUM BROMIDE 0.02 % IN SOLN
0.5000 mg | RESPIRATORY_TRACT | Status: DC | PRN
  Administered 2012-10-30 – 2012-11-01 (×2): 0.5 mg via RESPIRATORY_TRACT
  Filled 2012-10-30 (×6): qty 2.5

## 2012-10-30 MED ORDER — ASPIRIN EC 81 MG PO TBEC
81.0000 mg | DELAYED_RELEASE_TABLET | Freq: Every day | ORAL | Status: DC
  Administered 2012-10-30 – 2012-11-04 (×6): 81 mg via ORAL
  Filled 2012-10-30 (×6): qty 1

## 2012-10-30 MED ORDER — PREDNISONE 20 MG PO TABS
20.0000 mg | ORAL_TABLET | Freq: Every day | ORAL | Status: DC
  Administered 2012-10-31 – 2012-11-04 (×5): 20 mg via ORAL
  Filled 2012-10-30 (×5): qty 1

## 2012-10-30 MED ORDER — OXYCODONE HCL 5 MG PO TABS: 5.0000 mg | ORAL_TABLET | ORAL | Status: DC | PRN

## 2012-10-30 MED ORDER — ONDANSETRON HCL 4 MG PO TABS: 4.0000 mg | ORAL_TABLET | Freq: Four times a day (QID) | ORAL | Status: DC | PRN

## 2012-10-30 MED ORDER — VERAPAMIL HCL ER 240 MG PO TBCR
240.0000 mg | EXTENDED_RELEASE_TABLET | Freq: Every day | ORAL | Status: DC
  Administered 2012-10-30 – 2012-11-03 (×5): 240 mg via ORAL
  Filled 2012-10-30 (×6): qty 1

## 2012-10-30 MED ORDER — IPRATROPIUM BROMIDE 0.02 % IN SOLN
0.5000 mg | Freq: Four times a day (QID) | RESPIRATORY_TRACT | Status: DC
  Administered 2012-10-31 – 2012-11-04 (×17): 0.5 mg via RESPIRATORY_TRACT
  Filled 2012-10-30 (×15): qty 2.5

## 2012-10-30 MED ORDER — GUAIFENESIN ER 600 MG PO TB12
600.0000 mg | ORAL_TABLET | Freq: Two times a day (BID) | ORAL | Status: DC | PRN
  Filled 2012-10-30: qty 1

## 2012-10-30 MED ORDER — SODIUM CHLORIDE 0.9 % IJ SOLN
3.0000 mL | Freq: Two times a day (BID) | INTRAMUSCULAR | Status: DC
  Administered 2012-10-30: 23:00:00 via INTRAVENOUS
  Administered 2012-10-31: 3 mL via INTRAVENOUS
  Administered 2012-10-31: 22:00:00 via INTRAVENOUS
  Administered 2012-11-01: 3 mL via INTRAVENOUS
  Administered 2012-11-01: 22:00:00 via INTRAVENOUS
  Administered 2012-11-03 – 2012-11-04 (×2): 3 mL via INTRAVENOUS

## 2012-10-30 MED ORDER — PIPERACILLIN-TAZOBACTAM 3.375 G IVPB
3.3750 g | Freq: Three times a day (TID) | INTRAVENOUS | Status: DC
  Administered 2012-10-30 – 2012-11-04 (×14): 3.375 g via INTRAVENOUS
  Filled 2012-10-30 (×16): qty 50

## 2012-10-30 MED ORDER — PIPERACILLIN-TAZOBACTAM 3.375 G IVPB
3.3750 g | Freq: Once | INTRAVENOUS | Status: AC
  Administered 2012-10-30: 3.375 g via INTRAVENOUS
  Filled 2012-10-30: qty 50

## 2012-10-30 MED ORDER — HEPARIN SODIUM (PORCINE) 5000 UNIT/ML IJ SOLN
5000.0000 [IU] | Freq: Three times a day (TID) | INTRAMUSCULAR | Status: DC
  Administered 2012-10-30 – 2012-11-04 (×13): 5000 [IU] via SUBCUTANEOUS
  Filled 2012-10-30 (×17): qty 1

## 2012-10-30 MED ORDER — ALUM & MAG HYDROXIDE-SIMETH 200-200-20 MG/5ML PO SUSP
30.0000 mL | Freq: Once | ORAL | Status: AC
  Administered 2012-10-30: 30 mL via ORAL
  Filled 2012-10-30: qty 30

## 2012-10-30 MED ORDER — VANCOMYCIN HCL IN DEXTROSE 1-5 GM/200ML-% IV SOLN
1000.0000 mg | Freq: Once | INTRAVENOUS | Status: AC
  Administered 2012-10-30: 1000 mg via INTRAVENOUS
  Filled 2012-10-30: qty 200

## 2012-10-30 MED ORDER — BUDESONIDE-FORMOTEROL FUMARATE 160-4.5 MCG/ACT IN AERO
2.0000 | INHALATION_SPRAY | Freq: Two times a day (BID) | RESPIRATORY_TRACT | Status: DC
  Administered 2012-10-30 – 2012-11-04 (×10): 2 via RESPIRATORY_TRACT
  Filled 2012-10-30 (×2): qty 6

## 2012-10-30 MED ORDER — ALBUTEROL SULFATE (5 MG/ML) 0.5% IN NEBU
2.5000 mg | INHALATION_SOLUTION | Freq: Four times a day (QID) | RESPIRATORY_TRACT | Status: DC
  Administered 2012-10-31 – 2012-11-04 (×17): 2.5 mg via RESPIRATORY_TRACT
  Filled 2012-10-30 (×19): qty 0.5

## 2012-10-30 MED ORDER — ONDANSETRON HCL 4 MG/2ML IJ SOLN: 4.0000 mg | Freq: Four times a day (QID) | INTRAMUSCULAR | Status: DC | PRN

## 2012-10-30 MED ORDER — VANCOMYCIN HCL IN DEXTROSE 1-5 GM/200ML-% IV SOLN
1000.0000 mg | Freq: Two times a day (BID) | INTRAVENOUS | Status: DC
  Administered 2012-10-31 – 2012-11-04 (×9): 1000 mg via INTRAVENOUS
  Filled 2012-10-30 (×10): qty 200

## 2012-10-30 MED ORDER — PROPAFENONE HCL 225 MG PO TABS
225.0000 mg | ORAL_TABLET | Freq: Two times a day (BID) | ORAL | Status: DC
  Administered 2012-10-31 – 2012-11-04 (×9): 225 mg via ORAL
  Filled 2012-10-30 (×10): qty 1

## 2012-10-30 MED ORDER — ACETAMINOPHEN 325 MG PO TABS: 650.0000 mg | ORAL_TABLET | Freq: Four times a day (QID) | ORAL | Status: DC | PRN

## 2012-10-30 MED ORDER — ACETAMINOPHEN 650 MG RE SUPP: 650.0000 mg | Freq: Four times a day (QID) | RECTAL | Status: DC | PRN

## 2012-10-30 NOTE — ED Notes (Signed)
Pt knows that urine is needed 

## 2012-10-30 NOTE — Telephone Encounter (Signed)
Mr. Mark [name] wife called saying she thinks his chest tube is "clogged up"  He is not in distress  I advised her to take him to the emergency room

## 2012-10-30 NOTE — Consult Note (Signed)
Reason for Consult:Chest tube Referring Physician: Dr. Casper [name] is an 72 y.o. male.  HPI: 72 yo WM with end stage COPD who has a chronic indwelling CT for a prolonged air leak. He presents with a cc/o fever and SOB.  He had resection of apical blebs by Dr. Donata [name] on 12/24. He later was assessed for endobronchial but the leak could not be isolated. He was discharged with a chest tube and has been followed as an outpatient. He recently had an infection in the space and cultures grew Alcaligenes. He has been on levofloxacin for 3 weeks and finished his course today.  This morning his wife called with concerns that the CT was clogged. She was advised to bring him to the ED for evaluation. She did not think he was febrile. In Ed noted to have temp of 101 and WBC of 25K. Also noted to have increased WOB. He does feel short of breath and c/o abdominal discomfort. He says it hurts along both sides. Regular BMs recently but none today.     Past Medical History  Diagnosis Date  . Atrial fibrillation   . Hyperlipidemia   . Weakness   . Dyspnea   . Headache   . Osteopenia   . COPD (chronic obstructive pulmonary disease)   . Allergic rhinitis     Past Surgical History  Procedure Laterality Date  . Catheter ablation    . Cataract extraction, bilateral    . Appendectomy    . Inguinal hernia repair    . Video assisted thoracoscopy (vats)/thorocotomy  08/17/2012    resection / stapling of blebs, mechanical pleurodesis   . Video assisted thoracoscopy  08/17/2012    Procedure: VIDEO ASSISTED THORACOSCOPY;  Surgeon: Mark Perna, MD;  Location: [facility] OR;  Service: Thoracic;  Laterality: Right;  . Resection of apical bleb  08/17/2012    Procedure: RESECTION OF APICAL BLEB;  Surgeon: Mark Perna, MD;  Location: [facility] OR;  Service: Thoracic;  Laterality: Right;  stapling of bleb  . Video bronchoscopy  08/27/2012    Procedure: VIDEO BRONCHOSCOPY;  Surgeon: Mark Ovens, MD;   Location: [facility] OR;  Service: Thoracic;  Laterality: N/A;  evaluation of endobronchial valves    Family History  Problem Relation Age of Onset  . Heart attack Father   . COPD Brother     deceased  . COPD Sister   . COPD Father   . Lung cancer Father     Social History:  reports that he has quit smoking. His smoking use included Cigarettes. He smoked 3.00 packs per day. He quit smokeless tobacco use about 47 years ago. He reports that he does not drink alcohol or use illicit drugs.  Allergies:  Allergies  Allergen Reactions  . Shrimp (Shellfish Allergy)   . Zolpidem Tartrate     Medications:  Scheduled: . piperacillin-tazobactam (ZOSYN)  IV  3.375 g Intravenous Once  . vancomycin  1,000 mg Intravenous Once    Results for orders placed during the hospital encounter of 10/30/12 (from the past 48 hour(s))  CBC WITH DIFFERENTIAL     Status: Abnormal   Collection Time    10/30/12 12:09 PM      Result Value Range   WBC 25.2 (*) 4.0 - 10.5 K/uL   RBC 4.56  4.22 - 5.81 MIL/uL   Hemoglobin 13.3  13.0 - 17.0 g/dL   HCT 16.1  09.6 - 04.5 %   MCV 90.8  78.0 -  100.0 fL   MCH 29.2  26.0 - 34.0 pg   MCHC 32.1  30.0 - 36.0 g/dL   RDW 16.1  09.6 - 04.5 %   Platelets 341  150 - 400 K/uL   Neutrophils Relative 77  43 - 77 %   Lymphocytes Relative 11 (*) 12 - 46 %   Monocytes Relative 12  3 - 12 %   Eosinophils Relative 0  0 - 5 %   Basophils Relative 0  0 - 1 %   Neutro Abs 19.4 (*) 1.7 - 7.7 K/uL   Lymphs Abs 2.8  0.7 - 4.0 K/uL   Monocytes Absolute 3.0 (*) 0.1 - 1.0 K/uL   Eosinophils Absolute 0.0  0.0 - 0.7 K/uL   Basophils Absolute 0.0  0.0 - 0.1 K/uL   Smear Review MORPHOLOGY UNREMARKABLE    COMPREHENSIVE METABOLIC PANEL     Status: Abnormal   Collection Time    10/30/12 12:09 PM      Result Value Range   Sodium 138  135 - 145 mEq/L   Potassium 4.5  3.5 - 5.1 mEq/L   Chloride 98  96 - 112 mEq/L   CO2 29  19 - 32 mEq/L   Glucose, Bld 133 (*) 70 - 99 mg/dL   BUN 21  6 - 23  mg/dL   Creatinine, Ser 4.09  0.50 - 1.35 mg/dL   Calcium 9.3  8.4 - 81.1 mg/dL   Total Protein 7.5  6.0 - 8.3 g/dL   Albumin 3.3 (*) 3.5 - 5.2 g/dL   AST 12  0 - 37 U/L   ALT 12  0 - 53 U/L   Alkaline Phosphatase 52  39 - 117 U/L   Total Bilirubin 0.5  0.3 - 1.2 mg/dL   GFR calc non Af Amer 82 (*) >90 mL/min   GFR calc Af Amer >90  >90 mL/min   Comment:            The eGFR has been calculated     using the CKD EPI equation.     This calculation has not been     validated in all clinical     situations.     eGFR's persistently     <90 mL/min signify     possible Chronic Kidney Disease.  CG4 I-STAT (LACTIC ACID)     Status: None   Collection Time    10/30/12 12:30 PM      Result Value Range   Lactic Acid, Venous 1.58  0.5 - 2.2 mmol/L  URINALYSIS, ROUTINE W REFLEX MICROSCOPIC     Status: Abnormal   Collection Time    10/30/12  2:29 PM      Result Value Range   Color, Urine YELLOW  YELLOW   APPearance CLEAR  CLEAR   Specific Gravity, Urine 1.021  1.005 - 1.030   pH 6.0  5.0 - 8.0   Glucose, UA NEGATIVE  NEGATIVE mg/dL   Hgb urine dipstick TRACE (*) NEGATIVE   Bilirubin Urine NEGATIVE  NEGATIVE   Ketones, ur NEGATIVE  NEGATIVE mg/dL   Protein, ur 30 (*) NEGATIVE mg/dL   Urobilinogen, UA 0.2  0.0 - 1.0 mg/dL   Nitrite NEGATIVE  NEGATIVE   Leukocytes, UA NEGATIVE  NEGATIVE  URINE MICROSCOPIC-ADD ON     Status: Abnormal   Collection Time    10/30/12  2:29 PM      Result Value Range   Squamous Epithelial / LPF FEW (*) RARE  WBC, UA 0-2  <3 WBC/hpf   RBC / HPF 0-2  <3 RBC/hpf   Bacteria, UA RARE  RARE   Casts GRANULAR CAST (*) NEGATIVE   Urine-Other MUCOUS PRESENT      Dg Chest 2 View  10/30/2012  *RADIOLOGY REPORT*  Clinical Data: Shortness of breath, chest pressure, right chest tube  CHEST - 2 VIEW  Comparison: 10/27/2012  Findings: Indwelling right chest tube positioned medially on the right lower hemithorax.  Small right apical pneumothorax, unchanged.  Chronic  interstitial markings/emphysematous changes.  Right basilar opacity, likely atelectasis.  The heart is normal in size.  Mild degenerative changes of the visualized thoracolumbar spine.  IMPRESSION: Stable right chest tube.  Small right apical pneumothorax, unchanged.   Original Report Authenticated By: Charline Bills, M.D.    BODY FLUID CULTURE 10/12/12  ALCALIGENES SPECIES    Antibiotic Sensitivity Microscan Status   AMIKACIN Sensitive <=16 Final   CEFTAZIDIME Sensitive <=1 Final   CIPROFLOXACIN Sensitive <=1 Final   GENTAMICIN Sensitive <=4 Final   IMIPENEM Sensitive <=4 Final   LEVOFLOXACIN Sensitive <=2 Final   PIP/TAZO Sensitive <=16 Final   TOBRAMYCIN Resistant >8 Final   TRIMETH/SULFA Resistant >2 Final          Review of Systems  Constitutional: Positive for fever, chills and malaise/fatigue.  Respiratory: Positive for shortness of breath.   Cardiovascular: Negative for chest pain.  Gastrointestinal: Positive for abdominal pain. Negative for nausea, vomiting and diarrhea.  Neurological: Positive for weakness.   Blood pressure 134/66, pulse 124, temperature 101.3 F (38.5 C), temperature source Rectal, resp. rate 22, SpO2 93.00%. Physical Exam  Vitals reviewed. Constitutional: He is oriented to person, place, and time.  Elderly, frail, increased WOB  HENT:  Head: Normocephalic and atraumatic.  [place] O2  Neck: No tracheal deviation present.  Cardiovascular: Normal rate and regular rhythm.   Respiratory: No stridor. He has wheezes.  CT site with mild erythema, serous drainage, no air leak  GI: He exhibits distension. There is tenderness (mildly tender diffusely, no peritoneal signs).  Neurological: He is alert and oriented to person, place, and time.  Skin: Skin is warm and dry.    Assessment/Plan: 72 yo with end stage COPD presented with concern for a clogged CT. His CT is functioning well and he does not have an air leak at present.   He was admitted with a fever of  101 and an elevated WBC. The chest is a potential source as he has had an infection there previously. On the other hand the fluid draining is benign in appearance, the chest tube site is as expected for a long term indwelling tube and the CXR does not showany fluid collection. He could just have bronchitis or pneumonia as well.  I am a little concerned about his abdomen. He c/o distention and pain. He is distended on exam but it is otherwise unremarkable. Will order KUB as it did look like his bowel(likely colon) was distended on the CXR(limited visualization)  Agree with broad spectrum antibiotics while awaiting results of studies.   Lidia Clavijo C 10/30/2012, 5:44 PM

## 2012-10-30 NOTE — ED Provider Notes (Signed)
History     CSN: [id number]  Arrival date & time 10/30/12  1015   First MD Initiated Contact with Patient 10/30/12 1027      Chief Complaint  Patient presents with  . Shortness of Breath    (Consider location/radiation/quality/duration/timing/severity/associated sxs/prior treatment) HPI Comments: Patient with PMH of HTN and end stage emphysema who uses oxygen at home presents with SOB since yesterday afternoon after chest tube got "clogged" with thick yellowish drainage. Patient had chest tube placed in December for collapsed lung and was in hospital for 3 weeks. Patient underwent urgent pulmonary resections of blebs for right air leak and and tension pneumothorax. Currently taking 20mg  prednisone daily as well as 3 week course of oral antibiotics for a gram-negative species grown from pleural fluid; currently taking Levaquin. Patient states shortness of breath worsens with movement and improves with rest with associated right sided chest tightness. Patient denies fever, but is afebrile in ED today to Tmax of 101.3. Patient denies headache, LOC, CP, N/V/D, and abdominal pain.  Thoracic surgeon is Kathlee Nations Tright III of [facility]. Last appt was 10/27/12. Pulmonologist is Clinton Young of [facility] who he had f/u with yesterday that was canceled due to weather. PCP Windle Guard.  The history is provided by the patient and the spouse. No language interpreter was used.    Past Medical History  Diagnosis Date  . Atrial fibrillation   . Hyperlipidemia   . Weakness   . Dyspnea   . Headache   . Osteopenia   . COPD (chronic obstructive pulmonary disease)   . Allergic rhinitis     Past Surgical History  Procedure Laterality Date  . Catheter ablation    . Cataract extraction, bilateral    . Appendectomy    . Inguinal hernia repair    . Video assisted thoracoscopy (vats)/thorocotomy  08/17/2012    resection / stapling of blebs, mechanical pleurodesis    . Video assisted thoracoscopy  08/17/2012    Procedure: VIDEO ASSISTED THORACOSCOPY;  Surgeon: [name], MD;  Location: [facility] OR;  Service: Thoracic;  Laterality: Right;  . Resection of apical bleb  08/17/2012    Procedure: RESECTION OF APICAL BLEB;  Surgeon: [name], MD;  Location: [facility] OR;  Service: Thoracic;  Laterality: Right;  stapling of bleb  . Video bronchoscopy  08/27/2012    Procedure: VIDEO BRONCHOSCOPY;  Surgeon: [name], MD;  Location: [facility] OR;  Service: Thoracic;  Laterality: N/A;  evaluation of endobronchial valves    Family History  Problem Relation Age of Onset  . Heart attack Father   . COPD Brother     deceased  . COPD Sister   . COPD Father   . Lung cancer Father     History  Substance Use Topics  . Smoking status: Former Smoker -- 3.00 packs/day    Types: Cigarettes  . Smokeless tobacco: Former Neurosurgeon    Quit date: 09/01/1965  . Alcohol Use: No     Review of Systems  Constitutional: Positive for fever. Negative for chills and diaphoresis.  Eyes: Negative for visual disturbance.  Respiratory: Positive for chest tightness and shortness of breath.   Cardiovascular: Negative for chest pain and leg swelling.  Gastrointestinal: Negative for nausea, vomiting, abdominal pain and diarrhea.  Musculoskeletal: Negative for back pain.  Skin: Negative for color change.  Neurological: Negative for syncope, light-headedness and numbness.  All other systems reviewed and are negative.    Allergies  Shrimp and Zolpidem tartrate  Home Medications   Current Outpatient Rx  Name  Route  Sig  Dispense  Refill  . albuterol (PROAIR HFA) 108 (90 BASE) MCG/ACT inhaler   Inhalation   Inhale 2 puffs into the lungs every 4 (four) hours as needed. For shortness of breath/wheezing         . albuterol (PROVENTIL) (2.5 MG/3ML) 0.083% nebulizer solution   Nebulization   Take 3 mLs (2.5 mg total) by nebulization every 6 (six) hours as needed for wheezing.   75  mL   12   . alendronate (FOSAMAX) 70 MG tablet   Oral   Take 70 mg by mouth every 7 (seven) days. Take with a full glass of water on an empty stomach.         Marland Kitchen aspirin EC 81 MG tablet   Oral   Take 1 tablet (81 mg total) by mouth daily.   150 tablet   2   . budesonide-formoterol (SYMBICORT) 160-4.5 MCG/ACT inhaler   Inhalation   Inhale 2 puffs into the lungs 2 (two) times daily. Rinse mouth   3 Inhaler   3   . Calcium-Magnesium-Vitamin D (ONE-A-DAY CALCIUM PLUS) 500-50-100 MG-MG-UNIT CHEW   Oral   Chew 1 tablet by mouth 2 (two) times daily.           . cephALEXin (KEFLEX) 500 MG capsule   Oral   Take 500 mg by mouth 3 (three) times daily.          Marland Kitchen EPINEPHrine (EPI-PEN) 0.3 mg/0.3 mL DEVI   Subcutaneous   Inject 0.3 mg into the skin daily as needed. For anaphylaxis         . guaiFENesin (MUCINEX) 600 MG 12 hr tablet   Oral   Take 1-2 mg by mouth every 12 (twelve) hours as needed. For cough         . ipratropium-albuterol (DUONEB) 0.5-2.5 (3) MG/3ML SOLN   Nebulization   Take 3 mLs by nebulization 4 (four) times daily as needed.   360 mL   11   . levofloxacin (LEVAQUIN) 500 MG tablet   Oral   Take 500 mg by mouth daily.          . magnesium oxide (MAG-OX) 400 MG tablet   Oral   Take 1 tablet (400 mg total) by mouth daily.         . Multiple Vitamins-Minerals (CENTRUM SILVER PO)   Oral   Take 1 tablet by mouth daily.           . predniSONE (DELTASONE) 20 MG tablet   Oral   Take 20 mg by mouth daily.         . propafenone (RYTHMOL) 225 MG tablet   Oral   Take 225 mg by mouth 2 (two) times daily.         . traMADol (ULTRAM) 50 MG tablet   Oral   Take 1-2 tablets (50-100 mg total) by mouth every 6 (six) hours as needed.   40 tablet   0   . verapamil (CALAN-SR) 240 MG CR tablet   Oral   Take 1 tablet (240 mg total) by mouth at bedtime.   30 tablet   9     BP 118/68  Pulse 123  Temp(Src) 101.3 F (38.5 C) (Rectal)  Resp 22   SpO2 94%  Physical Exam  Nursing note and vitals reviewed. Constitutional: He is oriented to person, place, and time. No distress.  HENT:  Head: Normocephalic and atraumatic.  Eyes: Conjunctivae and EOM are normal. Pupils are equal, round, and reactive to light. No scleral icterus.  Neck: Normal range of motion. Neck supple.  Cardiovascular: Normal rate, regular rhythm, normal heart sounds and intact distal pulses.   Pulmonary/Chest: Effort normal. No respiratory distress. He has no wheezes. He has rhonchi.  Pale yellow drainage appreciated in the pleurovac without blockage  Abdominal: Soft. Bowel sounds are normal. He exhibits distension. He exhibits no mass. There is no tenderness. There is no rebound and no guarding.  Patient states distension is at baseline and was told likely 2/2 predisone  Musculoskeletal: Normal range of motion.  Lymphadenopathy:    He has no cervical adenopathy.  Neurological: He is alert and oriented to person, place, and time.  Skin: Skin is warm and dry. No rash noted. He is not diaphoretic. No erythema.  Chest tube insertion site without erythema, swelling, or drainage  Psychiatric: He has a normal mood and affect. His behavior is normal.    ED Course  Procedures (including critical care time)  Labs Reviewed  CBC WITH DIFFERENTIAL - Abnormal; Notable for the following:    WBC 25.2 (*)    Lymphocytes Relative 11 (*)    Neutro Abs 19.4 (*)    Monocytes Absolute 3.0 (*)    All other components within normal limits  COMPREHENSIVE METABOLIC PANEL - Abnormal; Notable for the following:    Glucose, Bld 133 (*)    Albumin 3.3 (*)    GFR calc non Af Amer 82 (*)    All other components within normal limits  CULTURE, BLOOD (ROUTINE X 2)  CULTURE, BLOOD (ROUTINE X 2)  BODY FLUID CULTURE  URINE CULTURE  URINALYSIS, ROUTINE W REFLEX MICROSCOPIC  CG4 I-STAT (LACTIC ACID)   Dg Chest 2 View  10/30/2012  *RADIOLOGY REPORT*  Clinical Data: Shortness of breath,  chest pressure, right chest tube  CHEST - 2 VIEW  Comparison: 10/27/2012  Findings: Indwelling right chest tube positioned medially on the right lower hemithorax.  Small right apical pneumothorax, unchanged.  Chronic interstitial markings/emphysematous changes.  Right basilar opacity, likely atelectasis.  The heart is normal in size.  Mild degenerative changes of the visualized thoracolumbar spine.  IMPRESSION: Stable right chest tube.  Small right apical pneumothorax, unchanged.   Original Report Authenticated By: Charline Bills, M.D.      Date: 10/30/2012  Rate: 119  Rhythm: sinus tachycardia  QRS Axis: normal  Intervals: normal  ST/T Wave abnormalities: nonspecific ST changes  Conduction Disutrbances:none  Narrative Interpretation: sinus tachycardia with borderline R wave progression in anterior leads; no STEMI or T wave inversion  Old EKG Reviewed: unchanged from 09/09/2009    1. Sepsis       MDM  Patient with hx of end stage emphysema and right lung airleak presents for SOB after his chest tube was blocked. Patient currently on abx prescribed by Dr. Morton Peters, his thoracic surgeon. Patient also followed by [name] of [facility]. W/u today to include CBC, CMP, lactic acid level, UA, EKG, and CXR.  CBC significant for leukocytosis to 25.2. Blood cultures ordered. Lactic acid normal at 1.58 and CXR shows stable R chest tube with a small right apical PTX unchanged from prior. UA pending. Patient feels symptoms have resolved slightly, compared to onset, since chest tube unclogged. Patient will be admitted to the hospital with a diagnosis of sepsis given fever, tachycardia and leukocytosis. Dr. Ethelda Chick has seen the patient with whom  I have discussed this work up and Customer service manager.         Antony Madura, PA-C 10/31/12 [phone]

## 2012-10-30 NOTE — Progress Notes (Signed)
ANTIBIOTIC CONSULT NOTE - INITIAL  Pharmacy Consult for Vancomycin/Zosyn Indication: ?chest tube infection  Allergies  Allergen Reactions  . Shrimp (Shellfish Allergy)   . Zolpidem Tartrate     Patient Measurements:   Adjusted Body Weight: 74.4 kg  Vital Signs: Temp: 101.3 F (38.5 C) (03/08 1038) Temp src: Rectal (03/08 1038) BP: 134/66 mmHg (03/08 1630) Pulse Rate: 124 (03/08 1630) Intake/Output from previous day:   Intake/Output from this shift:    Labs:  Recent Labs  10/30/12 1209  WBC 25.2*  HGB 13.3  PLT 341  CREATININE 0.93   The CrCl is unknown because both a height and weight (above a minimum accepted value) are required for this calculation. No results found for this basename: VANCOTROUGH, VANCOPEAK, VANCORANDOM, GENTTROUGH, GENTPEAK, GENTRANDOM, TOBRATROUGH, TOBRAPEAK, TOBRARND, AMIKACINPEAK, AMIKACINTROU, AMIKACIN,  in the last 72 hours   Microbiology: No results found for this or any previous visit (from the past 720 hour(s)).  Medical History: Past Medical History  Diagnosis Date  . Atrial fibrillation   . Hyperlipidemia   . Weakness   . Dyspnea   . Headache   . Osteopenia   . COPD (chronic obstructive pulmonary disease)   . Allergic rhinitis     Medications:  Scheduled:  . [COMPLETED] alum & mag hydroxide-simeth  30 mL Oral Once  . aspirin EC  81 mg Oral Daily  . budesonide-formoterol  2 puff Inhalation BID  . heparin  5,000 Units Subcutaneous Q8H  . piperacillin-tazobactam (ZOSYN)  IV  3.375 g Intravenous Once  . [START ON 10/31/2012] predniSONE  20 mg Oral Daily  . [START ON 10/31/2012] propafenone  225 mg Oral BID  . sodium chloride  3 mL Intravenous Q12H  . vancomycin  1,000 mg Intravenous Once  . verapamil  240 mg Oral QHS   Assessment: 72 yo male with end stage COPD and chronic indwelling CT for prolonged air leak.  Presents with fever, SOB, and leukocytosis.  Outpatient culture growing alcaligenes.  On levofloxacin for 3 weeks  PTA.  Received Vancomycin 1g in ED at 1819 PM, and Zosyn 3.375g IV at 1638 PM.  Goal of Therapy:  Vancomycin trough level 15-20 mcg/ml  Plan:  1. Vancomycin 1g IV q 12 hrs. 2. Zosyn 3.375 g IV q 8 hrs - infuse over 4 hrs. 3. F/U cultures 4. Vancomycin trough level at steady state as needed.  Gardner Candle 10/30/2012,6:20 PM

## 2012-10-30 NOTE — H&P (Signed)
History and Physical Note Family Medicine Teaching Service Amber M. Hairford, MD Service Pager: [phone]  Mark [name] is an 72 y.o. male.   Chief Complaint: clogged chest tube HPI:  Pt is a 72 yo M with oxygen dependent COPD who is followed by Dr. Alla German for chronic indwelling chest tube s/p right lung airleak following urgent pulmonary resections of blebs and tension pneumothorax. He has had chest tube since December with close outpatient monitoring. Since last night, he reports progressively worsening SOB and that his chest tube was "clogged" with thick drainage. His wife attempted to get it open this morning and was unsuccessful so since he was working hard to breathe, she unhooked the tube. He felt somewhat better at that time. At the recommendation of the CT surgeon on call, he came to the ED for evaluation. He has been on antibiotics for organisms in his pleural fluid in February, most recently Levaquin per CT surgery. He takes prednisone 20mg  daily and has not missed any doses. He denies any recent illnesses, although his wife has had a URI x 1 week.   ED Course: Pt noted to have thick yellow discharge from his chest tube in the ED. Febrile to 102, and has leukocytosis to 29. CXR unremarkable. Blood, urine and pleural fluid cultures ordered and patient was started on Vanc/Zosyn for broad coverage. CT surgery notified and will consult on patient today.   Past Medical History  Diagnosis Date  . Atrial fibrillation   . Hyperlipidemia   . Weakness   . Dyspnea   . Headache   . Osteopenia   . COPD (chronic obstructive pulmonary disease)   . Allergic rhinitis     Past Surgical History  Procedure Laterality Date  . Catheter ablation    . Cataract extraction, bilateral    . Appendectomy    . Inguinal hernia repair    . Video assisted thoracoscopy (vats)/thorocotomy  08/17/2012    resection / stapling of blebs, mechanical pleurodesis   . Video assisted thoracoscopy  08/17/2012   Procedure: VIDEO ASSISTED THORACOSCOPY;  Surgeon: [name], MD;  Location: [facility] OR;  Service: Thoracic;  Laterality: Right;  . Resection of apical bleb  08/17/2012    Procedure: RESECTION OF APICAL BLEB;  Surgeon: [name], MD;  Location: [facility] OR;  Service: Thoracic;  Laterality: Right;  stapling of bleb  . Video bronchoscopy  08/27/2012    Procedure: VIDEO BRONCHOSCOPY;  Surgeon: [name], MD;  Location: [facility] OR;  Service: Thoracic;  Laterality: N/A;  evaluation of endobronchial valves    Family History  Problem Relation Age of Onset  . Heart attack Father   . COPD Brother     deceased  . COPD Sister   . COPD Father   . Lung cancer Father    Social History:  reports that he has quit smoking. His smoking use included Cigarettes. He smoked 3.00 packs per day. He quit smokeless tobacco use about 47 years ago. He reports that he does not drink alcohol or use illicit drugs.  Allergies:  Allergies  Allergen Reactions  . Shrimp (Shellfish Allergy)   . Zolpidem Tartrate      (Not in a hospital admission)  Results for orders placed during the hospital encounter of 10/30/12 (from the past 48 hour(s))  CBC WITH DIFFERENTIAL     Status: Abnormal   Collection Time    10/30/12 12:09 PM      Result Value Range   WBC 25.2 (*)  4.0 - 10.5 K/uL   RBC 4.56  4.22 - 5.81 MIL/uL   Hemoglobin 13.3  13.0 - 17.0 g/dL   HCT 47.8  29.5 - 62.1 %   MCV 90.8  78.0 - 100.0 fL   MCH 29.2  26.0 - 34.0 pg   MCHC 32.1  30.0 - 36.0 g/dL   RDW 30.8  65.7 - 84.6 %   Platelets 341  150 - 400 K/uL   Neutrophils Relative 77  43 - 77 %   Lymphocytes Relative 11 (*) 12 - 46 %   Monocytes Relative 12  3 - 12 %   Eosinophils Relative 0  0 - 5 %   Basophils Relative 0  0 - 1 %   Neutro Abs 19.4 (*) 1.7 - 7.7 K/uL   Lymphs Abs 2.8  0.7 - 4.0 K/uL   Monocytes Absolute 3.0 (*) 0.1 - 1.0 K/uL   Eosinophils Absolute 0.0  0.0 - 0.7 K/uL   Basophils Absolute 0.0  0.0 - 0.1 K/uL   Smear Review  MORPHOLOGY UNREMARKABLE    COMPREHENSIVE METABOLIC PANEL     Status: Abnormal   Collection Time    10/30/12 12:09 PM      Result Value Range   Sodium 138  135 - 145 mEq/L   Potassium 4.5  3.5 - 5.1 mEq/L   Chloride 98  96 - 112 mEq/L   CO2 29  19 - 32 mEq/L   Glucose, Bld 133 (*) 70 - 99 mg/dL   BUN 21  6 - 23 mg/dL   Creatinine, Ser 9.62  0.50 - 1.35 mg/dL   Calcium 9.3  8.4 - 95.2 mg/dL   Total Protein 7.5  6.0 - 8.3 g/dL   Albumin 3.3 (*) 3.5 - 5.2 g/dL   AST 12  0 - 37 U/L   ALT 12  0 - 53 U/L   Alkaline Phosphatase 52  39 - 117 U/L   Total Bilirubin 0.5  0.3 - 1.2 mg/dL   GFR calc non Af Amer 82 (*) >90 mL/min   GFR calc Af Amer >90  >90 mL/min   Comment:            The eGFR has been calculated     using the CKD EPI equation.     This calculation has not been     validated in all clinical     situations.     eGFR's persistently     <90 mL/min signify     possible Chronic Kidney Disease.  CG4 I-STAT (LACTIC ACID)     Status: None   Collection Time    10/30/12 12:30 PM      Result Value Range   Lactic Acid, Venous 1.58  0.5 - 2.2 mmol/L   Dg Chest 2 View  10/30/2012  *RADIOLOGY REPORT*  Clinical Data: Shortness of breath, chest pressure, right chest tube  CHEST - 2 VIEW  Comparison: 10/27/2012  Findings: Indwelling right chest tube positioned medially on the right lower hemithorax.  Small right apical pneumothorax, unchanged.  Chronic interstitial markings/emphysematous changes.  Right basilar opacity, likely atelectasis.  The heart is normal in size.  Mild degenerative changes of the visualized thoracolumbar spine.  IMPRESSION: Stable right chest tube.  Small right apical pneumothorax, unchanged.   Original Report Authenticated By: Charline Bills, M.D.     Review of Systems  Constitutional: Positive for chills (?chills vs being cold due to power outage at home). Negative for fever.  Respiratory:  Positive for shortness of breath. Negative for cough.   Cardiovascular:  Negative for chest pain and palpitations.  Gastrointestinal: Positive for abdominal pain. Negative for nausea and vomiting.  Genitourinary: Negative for dysuria.  Musculoskeletal: Positive for back pain.  Skin: Negative for rash.  Neurological: Negative for dizziness and headaches.    Blood pressure 113/66, pulse 114, temperature 101.3 F (38.5 C), temperature source Rectal, resp. rate 20, SpO2 71.00%. Physical Exam  Constitutional: He is oriented to person, place, and time. He appears well-developed and well-nourished.  Appears uncomfortable  HENT:  Head: Normocephalic and atraumatic.  Mouth/Throat: Oropharynx is clear and moist.  Neck: Normal range of motion.  Cardiovascular: Regular rhythm and normal pulses.  Tachycardia present.   Respiratory: Accessory muscle usage present. He has no decreased breath sounds. He has no wheezes. He has no rales.  Chest tube in place right mid-axillary line. Bandage around tube is saturated with yellow fluid, which wife states is new. There is thick purulent discharge in the tubing. Does not appear tender around the chest tube site. Skin not examined.   GI: Soft. Bowel sounds are normal. There is tenderness (diffuse). There is no rebound.  Musculoskeletal: Normal range of motion. He exhibits no edema and no tenderness.  Lymphadenopathy:    He has no cervical adenopathy.  Neurological: He is alert and oriented to person, place, and time. No cranial nerve deficit. Coordination normal.  Skin: Skin is warm and dry. No rash noted.  Psychiatric: He has a normal mood and affect.     Assessment/Plan 72 yo M with chronic indwelling chest tube admitted with clotted chest tube, dyspnea, fever, leukocytosis  # Sepsis- Patient with fever, leukocytosis, tachypnea, tachycardia and possible source from chest tube. Lactic acid normal. - Admit to telemetry, attending Dr. Leveda Anna - Consult CT surgery for chest tube management - Continue Vancomycin and Zosyn -  Blood, urine and pleural fluid cultures pending - Monitor vitals - Tylenol as needed for fevers  # COPD- Known end stage COPD. - Continue O2 to keep sats >88% - Continue home Symbicort - Duonebs q 6 hours prn - Guaifenesin as needed for cough - Repeat CXR in the AM - Continue Prednisone 20mg  daily  # Abdominal pain- Unknown etiology - Given Maalox in ED which helped some - Last bowel movement yesterday, which was normal - Will get plain film of abd for now and monitor  # Afib- Currently in sinus - Continue home dose of Verapamil - Monitor on telemetry  FEN/GI- NPO until evaluated by CT surgery PPX- Heparin Sub Q Dispo- Pending further work up and clinical improvement   HAIRFORD, AMBER 10/30/2012, 2:53 PM

## 2012-10-30 NOTE — ED Notes (Signed)
Pt has a chest tube that he has had since December for a collapsed lung. States that last night around 1800 the chest tube was stopped up and air was not able to pass through. States he became very short of breath and o2 level was 94% on 2L. Wife removed drainage system and taped a empty water bottle over open end of tube.

## 2012-10-30 NOTE — ED Provider Notes (Signed)
Complains of cough and shortness of breath. Wife states the patient's chest tube was clotted earlier today. Wife treated patient by taking a bottle over the distal end of the chest tube. On exam patient is alert speaks in sentences. No respiratory distress lungs with diffuse rhonchi heart regular rate and rhythm, tachycardic abdomen nondistended nontender chest tube is coming from right lateral chest. Site is clean. There is a small amount of yellow drainage in the pleurevac in light of patient's leukocytosis and respiratory complaint will treat with empiric antibiotics, vancomycin and Zosyn Spoke with Dr. Dorris Fetch who will evaluate patient while in the hospital. Also spoke with family medicine resident physician who will arrange for admission Diagnosis sepsis  Doug Sou, MD 10/30/12 1420

## 2012-10-30 NOTE — ED Notes (Signed)
Pt is trying to void at this time.

## 2012-10-31 ENCOUNTER — Inpatient Hospital Stay (HOSPITAL_COMMUNITY): Payer: Medicare Other

## 2012-10-31 DIAGNOSIS — A419 Sepsis, unspecified organism: Principal | ICD-10-CM

## 2012-10-31 DIAGNOSIS — Z5189 Encounter for other specified aftercare: Secondary | ICD-10-CM

## 2012-10-31 LAB — PHOSPHORUS: Phosphorus: 3 mg/dL (ref 2.3–4.6)

## 2012-10-31 LAB — BASIC METABOLIC PANEL
CO2: 31 mEq/L (ref 19–32)
Calcium: 8.6 mg/dL (ref 8.4–10.5)
Creatinine, Ser: 0.99 mg/dL (ref 0.50–1.35)
GFR calc non Af Amer: 80 mL/min — ABNORMAL LOW (ref >90)
Sodium: 138 mEq/L (ref 135–145)

## 2012-10-31 LAB — PROTIME-INR
INR: 1.34 (ref 0.00–1.49)
Prothrombin Time: 16.3 seconds — ABNORMAL HIGH (ref 11.6–15.2)

## 2012-10-31 LAB — CBC
HCT: 36.8 % — ABNORMAL LOW (ref 39.0–52.0)
Hemoglobin: 11.7 g/dL — ABNORMAL LOW (ref 13.0–17.0)
RBC: 4.07 MIL/uL — ABNORMAL LOW (ref 4.22–5.81)
WBC: 17.8 10*3/uL — ABNORMAL HIGH (ref 4.0–10.5)

## 2012-10-31 LAB — MAGNESIUM: Magnesium: 2.6 mg/dL — ABNORMAL HIGH (ref 1.5–2.5)

## 2012-10-31 LAB — APTT: aPTT: 43 seconds — ABNORMAL HIGH (ref 24–37)

## 2012-10-31 MED ORDER — ALUM & MAG HYDROXIDE-SIMETH 200-200-20 MG/5ML PO SUSP
15.0000 mL | Freq: Four times a day (QID) | ORAL | Status: DC | PRN
  Administered 2012-11-01 – 2012-11-02 (×2): 15 mL via ORAL
  Filled 2012-10-31: qty 30

## 2012-10-31 MED ORDER — POLYETHYLENE GLYCOL 3350 17 G PO PACK
17.0000 g | PACK | Freq: Every day | ORAL | Status: DC
  Administered 2012-10-31 – 2012-11-03 (×4): 17 g via ORAL
  Filled 2012-10-31 (×5): qty 1

## 2012-10-31 NOTE — Progress Notes (Signed)
Seen and examined.  Feels better this morning.  Agree with Dr. Durene Cal.

## 2012-10-31 NOTE — H&P (Signed)
Seen and examined.  Discussed with Dr. Mikel Cella.  Agree with her management and documentation.  Briefly, 72 yo male with chronic indwelling left chest tube presents with SOB and purulent drainage from tube site.  He seems to have two distinct problems. 1. Clogged chest tube, since relieved.  Appreciate CVTS consult 2. Likely infected chest tube site.  We are treating with broad spectrum antibiotics until cultures are available.   Side note.  They are out of power at his home.

## 2012-10-31 NOTE — Progress Notes (Signed)
PGY-2 Daily Progress Note Family Medicine Teaching Service Aldine Contes. Marti Sleigh, MD Service Pager: [phone]   Subjective: States he is breathing much more comfortably today. On home 02 levels. Still feels very weak. Had BM yesterday and took some maalox and states abdominal pain improved by about 25%.   Objective:  Temp:  [98.4 F (36.9 C)-101.3 F (38.5 C)] 98.4 F (36.9 C) (03/09 0436) Pulse Rate:  [93-124] 93 (03/09 0436) Resp:  [17-26] 24 (03/09 0436) BP: (108-134)/(60-84) 108/60 mmHg (03/09 0436) SpO2:  [71 %-100 %] 90 % (03/09 0940) Weight:  [164 lb (74.39 kg)-164 lb 7.4 oz (74.6 kg)] 164 lb 7.4 oz (74.6 kg) (03/09 0436)  Intake/Output Summary (Last 24 hours) at 10/31/12 1012 Last data filed at 10/31/12 0900  Gross per 24 hour  Intake    170 ml  Output    646 ml  Net   -476 ml   Physical exam:  Gen:  NAD resting comfortably in bed with 3L [place] HEENT: dry mucus membranes CV: Regular rate and rhythm, no murmurs rubs or gallops PULM:  Minimal diffuse wheeze.  No crackles. Right chest tube in place, clean bandage, yellow fluid. Slightly tachpneic, on some breaths note supraclavicular slight retraction.  ABD: slightly distended, mild-moderate pain in epigastric area. Normal bowel sounds. soft/nontender/nondistended/normal bowel sounds EXT: No edema Neuro: Alert and oriented x3, grossly normal.   Labs and imaging:   CBC  Recent Labs Lab 10/30/12 1209 10/30/12 1855 10/31/12 0615  WBC 25.2* 24.5* 17.8*  HGB 13.3 12.8* 11.7*  HCT 41.4 39.7 36.8*  PLT 341 313 283   BMET/CMET  Recent Labs Lab 10/30/12 1209 10/30/12 1855 10/31/12 0615  NA 138  --  138  K 4.5  --  3.8  CL 98  --  97  CO2 29  --  31  BUN 21  --  19  CREATININE 0.93 1.04 0.99  CALCIUM 9.3  --  8.6  PROT 7.5  --   --   BILITOT 0.5  --   --   ALKPHOS 52  --   --   ALT 12  --   --   AST 12  --   --   GLUCOSE 133*  --  159*    Dg Chest 2 View  10/30/2012  *RADIOLOGY REPORT*  Clinical Data:  Shortness of breath, chest pressure, right chest tube  CHEST - 2 VIEW  Comparison: 10/27/2012  Findings: Indwelling right chest tube positioned medially on the right lower hemithorax.  Small right apical pneumothorax, unchanged.  Chronic interstitial markings/emphysematous changes.  Right basilar opacity, likely atelectasis.  The heart is normal in size.  Mild degenerative changes of the visualized thoracolumbar spine.  IMPRESSION: Stable right chest tube.  Small right apical pneumothorax, unchanged.   Original Report Authenticated By: Charline Bills, M.D.    Dg Abd 2 Views  10/31/2012  *RADIOLOGY REPORT*  Clinical Data: Abdominal pain.  ABDOMEN - 2 VIEW  Comparison: CTA of the chest, abdomen and pelvis performed 09/12/2003  Findings: The visualized bowel gas pattern is unremarkable. Scattered air and stool filled loops of colon are seen; no abnormal dilatation of small bowel loops is seen to suggest small bowel obstruction.  No free intra-abdominal air is identified on the provided decubitus view.  The visualized osseous structures are within normal limits; the sacroiliac joints are unremarkable in appearance.  The visualized lung bases are essentially clear.  IMPRESSION: Unremarkable bowel gas pattern; no free intra-abdominal air seen.  Original Report Authenticated By: Tonia Ghent, M.D.     I have reviewed the patient's current medications. Scheduled: . albuterol  2.5 mg Nebulization QID  . aspirin EC  81 mg Oral Daily  . budesonide-formoterol  2 puff Inhalation BID  . heparin  5,000 Units Subcutaneous Q8H  . ipratropium  0.5 mg Nebulization QID  . piperacillin-tazobactam (ZOSYN)  IV  3.375 g Intravenous Q8H  . predniSONE  20 mg Oral Daily  . propafenone  225 mg Oral BID  . sodium chloride  3 mL Intravenous Q12H  . vancomycin  1,000 mg Intravenous Q12H  . verapamil  240 mg Oral QHS   Continuous:  ZOX:[id number], acetaminophen, albuterol, guaiFENesin, ipratropium, ondansetron (ZOFRAN)  IV, ondansetron, traMADol  Assessment/Plan:   72 yo M with chronic indwelling chest tube admitted with clotted chest tube, dyspnea, fever, leukocytosis  # Sepsis- Patient with fever, leukocytosis, tachypnea, tachycardia and possible source from chest tube. Lactic acid normal. Chest tube as possible source but CTS thinks unlikely.  -WBC improving and patient afebrile. breathing improved as well.  - Blood, urine and pleural fluid cultures pending - Continue Vancomycin and Zosyn. Will consider deescalating based off cultures but would like at least 48 hours before altering course.  - CT managing chest tube. No comment or orders for suction vs. Water seal. Will follow their note today and call for clarification if needed.  -abdominal pain improving after bowel movement but still with epigastric tenderness. No nausea/vomiting. If does not continue to improve (will trial miralax) would consider lipase and abdominal ultrasound (evaluate gallbladder and pancreas).   # COPD- Known end stage COPD. - Continue O2 to keep sats >88%. On home oxygen.  - Continue home Symbicort - Duonebs q 6 hours prn - Guaifenesin as needed for cough - CXR appears stable to me - Continue Prednisone 20mg  daily  # Abdominal pain- Unknown etiology - Given Maalox in ED which helped some - Last bowel movement 3/8 which gave some relief of pain.  - KUB unremarkable. -schedule miralax given relief with BM  # Afib- Currently in sinus - Continue home dose of Verapamil - Monitor on telemetry  FEN/GI- heart healthy diet PPX- Heparin Sub Q Dispo- Pending further work up and clinical improvement, suspect at least 2 day stay. Ordered PT/OT eval.   Tana Conch, MD PGY2, Family Medicine Teaching Service [phone]

## 2012-11-01 ENCOUNTER — Inpatient Hospital Stay (HOSPITAL_COMMUNITY): Payer: Medicare Other

## 2012-11-01 DIAGNOSIS — J930 Spontaneous tension pneumothorax: Secondary | ICD-10-CM

## 2012-11-01 DIAGNOSIS — J449 Chronic obstructive pulmonary disease, unspecified: Secondary | ICD-10-CM

## 2012-11-01 LAB — URINE CULTURE

## 2012-11-01 MED ORDER — IOHEXOL 300 MG/ML  SOLN
80.0000 mL | Freq: Once | INTRAMUSCULAR | Status: AC | PRN
  Administered 2012-11-01: 80 mL via INTRAVENOUS

## 2012-11-01 NOTE — Progress Notes (Signed)
  Subjective: SOB,fever and WBC better on IV antibiotics I changed out the chest tube which was almost blocked off with fibrinous /protein material Occluded tube prob cause the infection- small empyema noted on CT scan which will resolve with new tube and IV antibiotics  Objective: Vital signs in last 24 hours: Temp:  [98.1 F (36.7 C)-98.7 F (37.1 C)] 98.1 F (36.7 C) (03/10 1423) Pulse Rate:  [72-102] 88 (03/10 1423) Cardiac Rhythm:  [-] Normal sinus rhythm;Sinus tachycardia (03/10 1404) Resp:  [20-22] 20 (03/10 1423) BP: (99-125)/(50-65) 109/65 mmHg (03/10 1423) SpO2:  [78 %-95 %] 92 % (03/10 1600) Weight:  [164 lb 7.4 oz (74.6 kg)] 164 lb 7.4 oz (74.6 kg) (03/10 0500)  Hemodynamic parameters for last 24 hours:  sinus  Intake/Output from previous day: 03/09 0701 - 03/10 0700 In: 1330 [P.O.:1080; IV Piggyback:250] Out: 1313 [Urine:1200; Stool:1; Chest Tube:112] Intake/Output this shift: Total I/O In: 480 [P.O.:480] Out: 501 [Urine:500; Stool:1]  EXAM  Chest tube site clean No air leak thru new tube, small amount drainage  Lab Results:  Recent Labs  10/30/12 1855 10/31/12 0615  WBC 24.5* 17.8*  HGB 12.8* 11.7*  HCT 39.7 36.8*  PLT 313 283   BMET:  Recent Labs  10/30/12 1209 10/30/12 1855 10/31/12 0615  NA 138  --  138  K 4.5  --  3.8  CL 98  --  97  CO2 29  --  31  GLUCOSE 133*  --  159*  BUN 21  --  19  CREATININE 0.93 1.04 0.99  CALCIUM 9.3  --  8.6    PT/INR:  Recent Labs  10/31/12 0615  LABPROT 16.3*  INR 1.34   ABG    Component Value Date/Time   PHART 7.516* 08/18/2012 0828   HCO3 23.1 08/18/2012 0828   TCO2 24 08/18/2012 0828   ACIDBASEDEF 0.8 08/18/2012 0447   O2SAT 99.0 08/18/2012 0828   CBG (last 3)  No results found for this basename: GLUCAP,  in the last 72 hours  Assessment/Plan: S/P   Cont IV antibiotics for 5 days then home on po broad spectrum coverage- will follow as out patient   LOS: 2 days    VAN TRIGT  III,PETER 11/01/2012

## 2012-11-01 NOTE — Consult Note (Signed)
WOC consult Note Reason for Consult: infection at chest tube site. Wound type: chest tube site Wound bed:no real open wound outside of the chest tube entry site. No skin loss around the tube entrance site. Drainage (amount, consistency, odor) none noted at the time of my assessment Periwound:does have some erythema , however pt reports CT in place since Christmas would consider just the position and the continual rubbing of the tubing laying against the skin a possible cause of the redness.  Dressing procedure/placement/frequency:continue routine CT dressings as ordered per CVTS.  Re consult if needed, will not follow at this time. Thanks  Melody [employer], CWOCN [phone])

## 2012-11-01 NOTE — Evaluation (Signed)
Occupational Therapy Evaluation Patient Details Name: Mark [name] MRN: [id number] DOB: 06/19/1941 Today's Date: 11/01/2012 Time: 4098-1191 OT Time Calculation (min): 23 min  OT Assessment / Plan / Recommendation Clinical Impression  PT is a 72 yo male admitted for infection of indwelling chest tube who now has the deficits listed below. Pt is most limited in his activity tolerance and becomes very SOB with activity.  Pt would benfit  from cont OT to review energy conservation education so pt can d/c back home with his wife.    OT Assessment  Patient needs continued OT Services    Follow Up Recommendations  No OT follow up    Barriers to Discharge None    Equipment Recommendations  None recommended by OT    Recommendations for Other Services    Frequency  Min 2X/week    Precautions / Restrictions Precautions Precautions: Fall Precaution Comments: pt also with chest tube. Restrictions Weight Bearing Restrictions: No   Pertinent Vitals/Pain Pt with 4/10 pain between ribs and stomach on the R.  O2 sats ran 89% with any activity at all on 4L O2 [place].    ADL  Eating/Feeding: Performed;Independent Where Assessed - Eating/Feeding: Chair Grooming: Performed;Wash/dry hands;Supervision/safety Where Assessed - Grooming: Supported standing Upper Body Bathing: Simulated;Set up Where Assessed - Upper Body Bathing: Supported sitting Lower Body Bathing: Simulated;Min guard Where Assessed - Lower Body Bathing: Supported sit to stand Upper Body Dressing: Simulated;Set up Where Assessed - Upper Body Dressing: Supported sitting Lower Body Dressing: Performed;Min guard Where Assessed - Lower Body Dressing: Supported sit to Pharmacist, hospital: Performed;Minimal Dentist Method: Sit to stand;Stand pivot Acupuncturist: Bedside commode Toileting - Clothing Manipulation and Hygiene: Performed;Min guard Where Assessed - Glass blower/designer Manipulation and  Hygiene: Standing Transfers/Ambulation Related to ADLs: Pt fatigues so quickly.  O2 sat on 4 L down to 88% with just transferring.   VEry little physical assist needed. ADL Comments: Pt does well with adls he just cannot tolerate much activity w/o several rest breaks b/c of SOB and decreased O2 sats.    OT Diagnosis: Generalized weakness  OT Problem List: Decreased strength;Decreased activity tolerance;Impaired balance (sitting and/or standing);Decreased knowledge of use of DME or AE OT Treatment Interventions: Self-care/ADL training;Energy conservation   OT Goals Acute Rehab OT Goals OT Goal Formulation: With patient Time For Goal Achievement: 11/15/12 Potential to Achieve Goals: Good ADL Goals Pt Will Perform Grooming: with modified independence;Sitting at sink;Standing at sink ADL Goal: Grooming - Progress: Goal set today Pt Will Perform Lower Body Bathing: with modified independence;Sit to stand from chair;Standing at sink;Sitting at sink ADL Goal: Lower Body Bathing - Progress: Goal set today Pt Will Perform Lower Body Dressing: with modified independence;Sit to stand from chair;Sitting, chair;Standing ADL Goal: Lower Body Dressing - Progress: Goal set today Additional ADL Goal #1: Pt will complete all toileting with mod I using comfort height commode. ADL Goal: Additional Goal #1 - Progress: Goal set today Miscellaneous OT Goals Miscellaneous OT Goal #1: Pt wills state and incorporate 3 energy conservation techniques into adl routine so he can tolerate Increased activity during adls. OT Goal: Miscellaneous Goal #1 - Progress: Goal set today  Visit Information  Last OT Received On: 11/01/12 Assistance Needed: +1    Subjective Data  Subjective: " I can do this stuff.  I just can't tolerate much activity." Patient Stated Goal: to breathe better.   Prior Functioning     Home Living Lives With: Spouse Available Help at Discharge:  Available 24 hours/day Type of Home:  House Home Access: Level entry Home Layout: One level;Other (Comment) (1 step into den) [employer] Shower/Tub: Heritage manager Toilet: Handicapped height Home Adaptive Equipment: Walker - rolling;Straight cane;Bedside commode/3-in-1;Shower chair with back;Wheelchair - manual Prior Function Level of Independence: Independent Able to Take Stairs?: Yes Driving: No (drove before christmas) Vocation: Retired Musician: No difficulties Dominant Hand: Right         Vision/Perception Vision - History Baseline Vision: No visual deficits Patient Visual Report: No change from baseline Vision - Assessment Vision Assessment: Vision not tested   [employer] Overall Cognitive Status: Appears within functional limits for tasks assessed/performed Arousal/Alertness: Awake/alert Orientation Level: Oriented X4 / Intact Behavior During Session: WFL for tasks performed Cognition - Other Comments: intact    Extremity/Trunk Assessment Right Upper Extremity Assessment RUE ROM/Strength/Tone: Within functional levels RUE Sensation: WFL - Light Touch RUE Coordination: WFL - gross/fine motor Left Upper Extremity Assessment LUE ROM/Strength/Tone: Within functional levels LUE Sensation: WFL - Light Touch LUE Coordination: WFL - gross/fine motor Trunk Assessment Trunk Assessment: Normal     Mobility Bed Mobility Bed Mobility: Rolling Right;Right Sidelying to Sit;Sitting - Scoot to Edge of Bed Rolling Right: 7: Independent Right Sidelying to Sit: 5: Supervision;With rails Sitting - Scoot to Edge of Bed: 7: Independent Details for Bed Mobility Assistance: Pt moves well in bed.  Pt just fatigues quickly and becomes very SOB. Transfers Transfers: Sit to Stand;Stand to Sit Sit to Stand: 4: Min guard Stand to Sit: 4: Min guard Details for Transfer Assistance: Pt safe with mobilty.  States he has cane and walker at home but has not needed to use them.  May need  walker now.     Exercise     Balance Balance Balance Assessed: No   End of Session OT - End of Session Activity Tolerance: Patient limited by fatigue Patient left: in chair;with call bell/phone within reach Nurse Communication: Mobility status  GO     Hope Budds 11/01/2012, 10:13 AM [phone]

## 2012-11-01 NOTE — Progress Notes (Signed)
Utilization Review Completed.Dowell, Deborah T3/05/2013  

## 2012-11-01 NOTE — Progress Notes (Signed)
FMTS Attending Note Patient seen and examined by me, discussed with Dr Birdie Sons and I agree with his assessment and plan for today.  Of note, patient does not appear to have abdominal tenderness on my exam this morning.  Last BM 2 days ago. Reports more mobility in the bed this morning than on previous days.  Agree with plan to hold antibiotics for the time being and watch clinically, watch WBC count. Paula Compton, MD

## 2012-11-01 NOTE — ED Provider Notes (Signed)
Medical screening examination/treatment/procedure(s) were conducted as a shared visit with non-physician practitioner(s) and myself.  I personally evaluated the patient during the encounter  Doug Sou, MD 11/01/12 [phone]

## 2012-11-01 NOTE — Evaluation (Signed)
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  Patient desats with minimal activity.  Encouraged use of RW at home for safety.  Will benefit from acute PT to maximize independence prior to discharge.  Recommended HHPT - patient declined.  Recommend scooter for mobility (patient desats ambulating 24' today - would use in home as well as to get out of home to appointments, etc.).    PT Assessment  Patient needs continued PT services    Follow Up Recommendations  No PT follow up;Supervision/Assistance - 24 hour    Does the patient have the potential to tolerate intense rehabilitation      Barriers to Discharge None      Equipment Recommendations  Scooter for use in home/out of home for appointments    Recommendations for Other Services     Frequency Min 3X/week    Precautions / Restrictions Precautions Precautions: Fall Precaution Comments: Patient with chronic chest tube Restrictions Weight Bearing Restrictions: No   Pertinent Vitals/Pain O2 sat   92% in bed               78% with ambulation 24'               91% within 2 minutes after ambulation      All above while on O2 at 4 l/min nasal cannula     Mobility  Bed Mobility Bed Mobility: Rolling Left;Left Sidelying to Sit;Sitting - Scoot to Edge of Bed;Sit to Supine Rolling Left: 6: Modified independent (Device/Increase time);With rail Left Sidelying to Sit: 5: Supervision;With rails Sitting - Scoot to Edge of Bed: 7: Independent Sit to Supine: 7: Independent Details for Bed Mobility Assistance: Encouraged patient to roll and use bed rail to come to sitting.   Transfers Transfers: Sit to Stand;Stand to Sit Sit to Stand: 4: Min guard;With  upper extremity assist;From bed Stand to Sit: 4: Min guard;With upper extremity assist;To bed Details for Transfer Assistance: Verbal cues for safe hand placement with use of RW. Ambulation/Gait Ambulation/Gait Assistance: 4: Min guard Ambulation Distance (Feet): 24 Feet Assistive device: Rolling walker Ambulation/Gait Assistance Details: Cues to move slowly.  Verbal cues for safe use of RW.  O2 sats dropped to 78 with ambulation short distance.  Sat EOB, encouraged pursed-lip breathing.  O2 sat increased to 91% within 2 minutes. Gait Pattern: Step-through pattern;Decreased stride length;Trunk flexed Gait velocity: Slow gait speed    Exercises     PT Diagnosis: Difficulty walking;Abnormality of gait;Generalized weakness  PT Problem List: Decreased strength;Decreased activity tolerance;Decreased balance;Decreased mobility;Decreased knowledge of use of DME;Cardiopulmonary status limiting activity PT Treatment Interventions: DME instruction;Gait training;Functional mobility training;Balance training;Patient/family education   PT Goals Acute Rehab PT Goals PT Goal Formulation: With patient/family Time For Goal Achievement: 11/08/12 Potential to Achieve Goals: Good Pt will Roll Supine to Left Side: with modified independence;with rail PT Goal: Rolling Supine to Left Side - Progress: Goal set today Pt will go Supine/Side to Sit: with modified independence;with HOB not 0 degrees (comment degree);with rail PT Goal: Supine/Side to Sit - Progress: Goal set today Pt will go Sit to Stand: with supervision;with upper extremity assist PT Goal: Sit to Stand - Progress: Goal set today Pt will Ambulate: 16 - 50 feet;with supervision;with rolling  walker (with O2 sat at or above 88%.) PT Goal: Ambulate - Progress: Goal set today  Visit Information  Last PT Received On: 11/01/12 Assistance Needed: +1    Subjective Data  Subjective: "I don't think HHPT will help at this point." Patient Stated Goal:  To go home.   Prior Functioning  Home Living Lives With: Spouse Available Help at Discharge: Family;Available 24 hours/day Type of Home: House Home Access: Level entry Home Layout: One level Bathroom Shower/Tub: Health visitor: Handicapped height Bathroom Accessibility: Yes How Accessible: Accessible via walker Home Adaptive Equipment: Walker - rolling;Straight cane;Bedside commode/3-in-1;Shower chair with back;Wheelchair - manual;Hospital bed Prior Function Level of Independence: Independent;Needs assistance Needs Assistance: Bathing;Meal Prep;Light Housekeeping Bath: Minimal (Can take sponge bath on own but takes too long) Meal Prep: Total Light Housekeeping: Total Able to Take Stairs?: No Driving: No Vocation: Retired Comments: Reports he could walk from one room to another, and then had to rest. Communication Communication: No difficulties Dominant Hand: Right    Cognition  Cognition Overall Cognitive Status: Appears within functional limits for tasks assessed/performed Arousal/Alertness: Awake/alert Orientation Level: Oriented X4 / Intact Behavior During Session: [employer] for tasks performed    Extremity/Trunk Assessment Right Upper Extremity Assessment RUE ROM/Strength/Tone: WFL for tasks assessed RUE Sensation: WFL - Light Touch Left Upper Extremity Assessment LUE ROM/Strength/Tone: WFL for tasks assessed LUE Sensation: WFL - Light Touch Right Lower Extremity Assessment RLE ROM/Strength/Tone: Deficits RLE ROM/Strength/Tone Deficits: Strength grossly 4/5 RLE Sensation: WFL - Light Touch RLE Coordination: WFL - gross motor Left Lower Extremity Assessment LLE ROM/Strength/Tone: Deficits LLE ROM/Strength/Tone Deficits: Strength grossly 4/5 LLE Sensation: WFL - Light Touch LLE Coordination: WFL - gross motor Trunk Assessment Trunk Assessment: Normal   Balance Balance Balance Assessed: Yes Static Sitting Balance Static Sitting - Balance Support: No  upper extremity supported;Feet supported Static Sitting - Level of Assistance: 7: Independent Static Sitting - Comment/# of Minutes: 6 minutes Static Standing Balance Static Standing - Balance Support: Bilateral upper extremity supported Static Standing - Level of Assistance: 5: Stand by assistance Static Standing - Comment/# of Minutes: 2 minutes in prep for ambulation.  End of Session PT - End of Session Equipment Utilized During Treatment: Gait belt;Oxygen Activity Tolerance: Patient limited by fatigue;Treatment limited secondary to medical complications (Comment) (Decrease in O2 sat) Patient left: in bed;with call bell/phone within reach;with family/visitor present Nurse Communication: Mobility status  GP     Vena Austria 11/01/2012, 3:51 PM Durenda Hurt. Renaldo Fiddler, [facility] Acute Rehab Services Pager [phone]

## 2012-11-01 NOTE — Progress Notes (Signed)
PGY-1 Daily Progress Note Family Medicine Teaching Service Service Pager: [phone]   Subjective: States is breathing better today and able to sit up now without help. States CT draining better this morning. Abdominal pain better with BM and malox.    Objective:  Temp:  [98.3 F (36.8 C)-99.1 F (37.3 C)] 98.3 F (36.8 C) (03/10 0348) Pulse Rate:  [72-102] 85 (03/10 0557) Resp:  [20-22] 20 (03/10 0557) BP: (99-125)/(50-61) 99/50 mmHg (03/10 0348) SpO2:  [90 %-94 %] 94 % (03/10 0828) Weight:  [164 lb 7.4 oz (74.6 kg)] 164 lb 7.4 oz (74.6 kg) (03/10 0500)  Intake/Output Summary (Last 24 hours) at 11/01/12 0928 Last data filed at 11/01/12 0700  Gross per 24 hour  Intake   1090 ml  Output   1113 ml  Net    -23 ml   Physical exam:  Gen:  NAD resting comfortably in bed with 4L [place] CV: Regular rate and rhythm, no murmurs rubs or gallops PULM:  CTAB.  No crackles. Right chest tube in place, clean bandage, yellow fluid. Slightly tachpneic.  ABD: slightly distended, mild-moderate pain in RLQ. Mild TTP in RLQ. Normal bowel sounds. soft/nondistended/normal bowel sounds EXT: No edema Neuro: Alert, grossly normal.   Labs and imaging:   CBC  Recent Labs Lab 10/30/12 1209 10/30/12 1855 10/31/12 0615  WBC 25.2* 24.5* 17.8*  HGB 13.3 12.8* 11.7*  HCT 41.4 39.7 36.8*  PLT 341 313 283   BMET/CMET  Recent Labs Lab 10/30/12 1209 10/30/12 1855 10/31/12 0615  NA 138  --  138  K 4.5  --  3.8  CL 98  --  97  CO2 29  --  31  BUN 21  --  19  CREATININE 0.93 1.04 0.99  CALCIUM 9.3  --  8.6  PROT 7.5  --   --   BILITOT 0.5  --   --   ALKPHOS 52  --   --   ALT 12  --   --   AST 12  --   --   GLUCOSE 133*  --  159*   BCx in process UCx multiple morphotypes Pleural Fluid Cx NG x 1 day  Dg Chest 2 View  10/30/2012  *RADIOLOGY REPORT*  Clinical Data: Shortness of breath, chest pressure, right chest tube  CHEST - 2 VIEW  Comparison: 10/27/2012  Findings: Indwelling right chest tube  positioned medially on the right lower hemithorax.  Small right apical pneumothorax, unchanged.  Chronic interstitial markings/emphysematous changes.  Right basilar opacity, likely atelectasis.  The heart is normal in size.  Mild degenerative changes of the visualized thoracolumbar spine.  IMPRESSION: Stable right chest tube.  Small right apical pneumothorax, unchanged.   Original Report Authenticated By: Charline Bills, M.D.    Portable Chest 1 View  10/31/2012  *RADIOLOGY REPORT*  Clinical Data: Chest tube  PORTABLE CHEST - 1 VIEW  Comparison: 10/30/2012  Findings: Right basilar chest tube is unchanged.  Small right apical pneumothorax, unchanged.  There is a small right effusion and mild right lower lobe airspace disease, unchanged.  COPD with hyperinflation and scarring in the lungs.  IMPRESSION: Small right apical pneumothorax unchanged.  Small right effusion and right lower lobe airspace disease is unchanged.   Original Report Authenticated By: Janeece Riggers, M.D.    Dg Abd 2 Views  10/31/2012  *RADIOLOGY REPORT*  Clinical Data: Abdominal pain.  ABDOMEN - 2 VIEW  Comparison: CTA of the chest, abdomen and pelvis performed 09/12/2003  Findings: The  visualized bowel gas pattern is unremarkable. Scattered air and stool filled loops of colon are seen; no abnormal dilatation of small bowel loops is seen to suggest small bowel obstruction.  No free intra-abdominal air is identified on the provided decubitus view.  The visualized osseous structures are within normal limits; the sacroiliac joints are unremarkable in appearance.  The visualized lung bases are essentially clear.  IMPRESSION: Unremarkable bowel gas pattern; no free intra-abdominal air seen.   Original Report Authenticated By: Tonia Ghent, M.D.     I have reviewed the patient's current medications. Scheduled: . albuterol  2.5 mg Nebulization QID  . aspirin EC  81 mg Oral Daily  . budesonide-formoterol  2 puff Inhalation BID  . heparin  5,000  Units Subcutaneous Q8H  . ipratropium  0.5 mg Nebulization QID  . piperacillin-tazobactam (ZOSYN)  IV  3.375 g Intravenous Q8H  . polyethylene glycol  17 g Oral Daily  . predniSONE  20 mg Oral Daily  . propafenone  225 mg Oral BID  . sodium chloride  3 mL Intravenous Q12H  . vancomycin  1,000 mg Intravenous Q12H  . verapamil  240 mg Oral QHS   Continuous:  RUE:[id number], acetaminophen, albuterol, alum & mag hydroxide-simeth, guaiFENesin, ipratropium, ondansetron (ZOFRAN) IV, ondansetron, traMADol  Assessment/Plan:   72 yo M with chronic indwelling chest tube admitted with clotted chest tube, dyspnea, fever, leukocytosis  # Sepsis- Patient with fever, leukocytosis, tachypnea, tachycardia and possible source from chest tube. Lactic acid normal. Chest tube as possible source but CTS thinks unlikely.  -WBC improving and patient afebrile. breathing improved as well.  - Blood, urine and pleural fluid cultures all show no source at this time - Continue Vancomycin and Zosyn. Will consider deescalating based off cultures but would like at least 48 hours before altering course. Will need to touch base with CTTS to see if they have antibiotic preference   # COPD- Known end stage COPD. - Continue O2 to keep sats >88%. On home oxygen.  - Continue home Symbicort - Duonebs q 6 hours prn - Guaifenesin as needed for cough - CXR appears stable to me - Continue Prednisone 20mg  daily  # Abdominal pain- Unknown etiology, likely related to consitpation given relief with BM - Given Maalox in ED which helped some - Last bowel movement 3/8 which gave some relief of pain.  - KUB unremarkable. -schedule miralax given relief with BM -consider lipase and abd [place] if pain continues  # Afib- Currently in sinus tach - Continue home dose of Verapamil - Monitor on telemetry  FEN/GI- heart healthy diet PPX- Heparin Sub Q Dispo- Pending further work up and clinical improvement, pending CTTS recs.   Marikay Alar, MD PGY1, Family Medicine Teaching Service [phone]

## 2012-11-02 ENCOUNTER — Encounter: Payer: Self-pay | Admitting: Internal Medicine

## 2012-11-02 ENCOUNTER — Inpatient Hospital Stay (HOSPITAL_COMMUNITY): Payer: Medicare Other

## 2012-11-02 DIAGNOSIS — J9382 Other air leak: Secondary | ICD-10-CM

## 2012-11-02 LAB — CBC
MCV: 88.6 fL (ref 78.0–100.0)
Platelets: 321 10*3/uL (ref 150–400)
RBC: 3.86 MIL/uL — ABNORMAL LOW (ref 4.22–5.81)
RDW: 15.5 % (ref 11.5–15.5)
WBC: 10.4 10*3/uL (ref 4.0–10.5)

## 2012-11-02 LAB — BASIC METABOLIC PANEL
CO2: 31 mEq/L (ref 19–32)
Calcium: 9 mg/dL (ref 8.4–10.5)
Chloride: 101 mEq/L (ref 96–112)
Creatinine, Ser: 0.8 mg/dL (ref 0.50–1.35)
GFR calc Af Amer: >90 mL/min (ref >90)
Sodium: 140 mEq/L (ref 135–145)

## 2012-11-02 LAB — OCCULT BLOOD X 1 CARD TO LAB, STOOL: Fecal Occult Bld: NEGATIVE

## 2012-11-02 MED ORDER — IOHEXOL 300 MG/ML  SOLN
100.0000 mL | Freq: Once | INTRAMUSCULAR | Status: AC | PRN
  Administered 2012-11-02: 80 mL via INTRAVENOUS

## 2012-11-02 MED ORDER — IOHEXOL 300 MG/ML  SOLN
25.0000 mL | INTRAMUSCULAR | Status: AC
  Administered 2012-11-02 (×2): 25 mL via ORAL

## 2012-11-02 NOTE — Progress Notes (Addendum)
FMTS Attending Admission Note: Mark Pagan MD [phone] pager office [phone] I  have seen and examined this patient, reviewed their chart. I have discussed this patient with the resident. I agree with the resident's findings, assessment and care plan.  Today he feels much better except that he has some pain on his right side of the chest,he had chest tube replacement procedure done yesterday,he also got a chest xray this morning to assess his pain and the chest tube,it came back with improved pneumothorax.He has been afebrile over 24 hrs. To continue antibiotic as planned. He also continues to complain of abdominal pain which he has had for about 5 days,he had loose bowel movement this morning,he described the stool as been dark brown,no blood,he had miralax yesterday.At this point due to his health status,I suggested CT abdomen to assess for ischemic colitis,the resident will order imaging.

## 2012-11-02 NOTE — Progress Notes (Signed)
PGY-1 Daily Progress Note Family Medicine Teaching Service Service Pager: [phone]   Subjective: continues to state doing well. Feels like he's breathing better following replacement of chest tube. States belly still hurts and is bigger than usual. States feels like its "gasing up."  Had a BM yesterday at 5 pm and this helps with pain.  Objective:  Temp:  [97.9 F (36.6 C)-98.1 F (36.7 C)] 97.9 F (36.6 C) (03/11 0431) Pulse Rate:  [72-92] 72 (03/11 0431) Resp:  [18-20] 18 (03/11 0431) BP: (109-110)/(59-68) 110/59 mmHg (03/11 0431) SpO2:  [78 %-96 %] 94 % (03/11 0431)  Intake/Output Summary (Last 24 hours) at 11/02/12 0813 Last data filed at 11/02/12 0745  Gross per 24 hour  Intake    483 ml  Output   1826 ml  Net  -1343 ml   Physical exam:  Gen:  NAD resting comfortably in bed with 4L [place] CV: rrr, no mrg PULM:  CTAB, no wheezes or crackles, right chest tube in place, clean bandage, yellow fluid  ABD: slightly distended, mild TTP throughout Neuro: Alert, grossly normal.   Labs and imaging:   CBC  Recent Labs Lab 10/30/12 1855 10/31/12 0615 11/02/12 0805  WBC 24.5* 17.8* 10.4  HGB 12.8* 11.7* 11.0*  HCT 39.7 36.8* 34.2*  PLT 313 283 321   BMET/CMET  Recent Labs Lab 10/30/12 1209 10/30/12 1855 10/31/12 0615 11/02/12 0805  NA 138  --  138 140  K 4.5  --  3.8 3.7  CL 98  --  97 101  CO2 29  --  31 31  BUN 21  --  19 12  CREATININE 0.93 1.04 0.99 0.80  CALCIUM 9.3  --  8.6 9.0  PROT 7.5  --   --   --   BILITOT 0.5  --   --   --   ALKPHOS 52  --   --   --   ALT 12  --   --   --   AST 12  --   --   --   GLUCOSE 133*  --  159* 110*   BCx NGTD UCx multiple morphotypes Pleural Fluid Cx NG x 2 day  Ct Chest W Contrast  11/01/2012 IMPRESSION:  1.  Multiloculated small right base empyema. 2.  Almost complete resolution of the right pneumothorax. 3.  Severe emphysema.   Original Report Authenticated By: Francene Boyers, M.D.     I have reviewed the patient's  current medications. Scheduled: . albuterol  2.5 mg Nebulization QID  . aspirin EC  81 mg Oral Daily  . budesonide-formoterol  2 puff Inhalation BID  . heparin  5,000 Units Subcutaneous Q8H  . ipratropium  0.5 mg Nebulization QID  . piperacillin-tazobactam (ZOSYN)  IV  3.375 g Intravenous Q8H  . polyethylene glycol  17 g Oral Daily  . predniSONE  20 mg Oral Daily  . propafenone  225 mg Oral BID  . sodium chloride  3 mL Intravenous Q12H  . vancomycin  1,000 mg Intravenous Q12H  . verapamil  240 mg Oral QHS   Continuous:  ZHY:[id number], acetaminophen, albuterol, alum & mag hydroxide-simeth, guaiFENesin, ipratropium, ondansetron (ZOFRAN) IV, ondansetron, traMADol  Assessment/Plan:   72 yo M with chronic indwelling chest tube admitted with clotted chest tube, dyspnea, fever, leukocytosis  1. Sepsis: Patient with fever, leukocytosis, tachypnea, tachycardia and likely source from chest tube. Lactic acid normal.   - WBC improved today and patient afebrile, breathing improved as well.  - Blood,  urine and pleural fluid cultures all show no source at this time - Continue Vancomycin and Zosyn. Will consider deescalating based off cultures but would like at least 48 hours before altering course. Will need to touch base with CTTS to see if they have antibiotic preference   2. COPD: Known end stage COPD. - Continue O2 to keep sats >88%. On home oxygen.  - Continue home Symbicort - Duonebs q 6 hours prn - Guaifenesin as needed for cough - Continue Prednisone 20mg  daily  3. Abdominal pain: Unknown etiology, likely related to consitpation given relief with BM, also considering ischemic collitis - will give malox and f/u this afternoon to see if improved - Last bowel movement 3/10 which gave some relief of pain.  - KUB unremarkable-will consider CT abd if still distended this afternoon - schedule miralax given relief with BM  4. Afib: Currently in NSR - Continue home dose of Verapamil and  propafenone - Monitor on telemetry  5. Anemia: patient with Hgb to 11 today. Appears to be near baseline of 11-12. Normal MCV. All other cell lines stable. -question of whether this could be related to chronic disease/COPD -will likely order retic count and peripheral smear, will order FOBT -consider iron studies, though doubt these would yield anything given normal MCV  FEN/GI- heart healthy diet PPX- Heparin Sub Q Dispo- Pending 5 day course of IV antibiotics   Marikay Alar, MD PGY1, Family Medicine Teaching Service [phone]

## 2012-11-02 NOTE — Progress Notes (Signed)
ANTIBIOTIC CONSULT NOTE - FOLLOW UP  Pharmacy Consult for Vancomycin/Zosyn Indication: chest tube infection   Allergies  Allergen Reactions  . Shrimp (Shellfish Allergy)   . Zolpidem Tartrate     Patient Measurements: Height: 5\' 11"  (180.3 cm) (per patient) Weight: 164 lb 7.4 oz (74.6 kg) IBW/kg (Calculated) : 75.3  Vital Signs: Temp: 97.9 F (36.6 C) (03/11 0431) Temp src: Oral (03/11 0431) BP: 110/59 mmHg (03/11 0431) Pulse Rate: 72 (03/11 0431) Intake/Output from previous day: 03/10 0701 - 03/11 0700 In: 483 [P.O.:480; I.V.:3] Out: 1626 [Urine:1625; Stool:1] Intake/Output from this shift: Total I/O In: 240 [P.O.:240] Out: 200 [Urine:200]  Labs:  Recent Labs  10/30/12 1855 10/31/12 0615 11/02/12 0805  WBC 24.5* 17.8* 10.4  HGB 12.8* 11.7* 11.0*  PLT 313 283 321  CREATININE 1.04 0.99 0.80   Estimated Creatinine Clearance: 88.1 ml/min (by C-G formula based on Cr of 0.8). No results found for this basename: VANCOTROUGH, VANCOPEAK, VANCORANDOM, GENTTROUGH, GENTPEAK, GENTRANDOM, TOBRATROUGH, TOBRAPEAK, TOBRARND, AMIKACINPEAK, AMIKACINTROU, AMIKACIN,  in the last 72 hours   Microbiology: Recent Results (from the past 720 hour(s))  CULTURE, BLOOD (ROUTINE X 2)     Status: None   Collection Time    10/30/12  1:55 PM      Result Value Range Status   Specimen Description BLOOD LEFT ARM   Final   Special Requests BOTTLES DRAWN AEROBIC AND ANAEROBIC 10CC   Final   Culture  Setup Time 10/31/2012 00:10   Final   Culture     Final   Value:        BLOOD CULTURE RECEIVED NO GROWTH TO DATE CULTURE WILL BE HELD FOR 5 DAYS BEFORE ISSUING A FINAL NEGATIVE REPORT   Report Status PENDING   Incomplete  CULTURE, BLOOD (ROUTINE X 2)     Status: None   Collection Time    10/30/12  2:00 PM      Result Value Range Status   Specimen Description BLOOD RIGHT ARM   Final   Special Requests BOTTLES DRAWN AEROBIC AND ANAEROBIC 10CC   Final   Culture  Setup Time 10/31/2012 00:10   Final    Culture     Final   Value:        BLOOD CULTURE RECEIVED NO GROWTH TO DATE CULTURE WILL BE HELD FOR 5 DAYS BEFORE ISSUING A FINAL NEGATIVE REPORT   Report Status PENDING   Incomplete  URINE CULTURE     Status: None   Collection Time    10/30/12  2:29 PM      Result Value Range Status   Specimen Description URINE, CLEAN CATCH   Final   Special Requests NONE   Final   Culture  Setup Time 10/31/2012 03:35   Final   Colony Count >=100,000 COLONIES/ML   Final   Culture     Final   Value: Multiple bacterial morphotypes present, none predominant. Suggest appropriate recollection if clinically indicated.   Report Status 11/01/2012 FINAL   Final  BODY FLUID CULTURE     Status: None   Collection Time    10/30/12  5:08 PM      Result Value Range Status   Specimen Description PLEURAL FLUID   Final   Special Requests NONE   Final   Gram Stain     Final   Value: FEW WBC PRESENT, PREDOMINANTLY PMN     NO ORGANISMS SEEN   Culture NO GROWTH 2 DAYS   Final   Report Status PENDING  Incomplete    Anti-infectives   Start     Dose/Rate Route Frequency Ordered Stop   10/31/12 0600  vancomycin (VANCOCIN) IVPB 1000 mg/200 mL premix     1,000 mg 200 mL/hr over 60 Minutes Intravenous Every 12 hours 10/30/12 1838     10/30/12 2200  piperacillin-tazobactam (ZOSYN) IVPB 3.375 g     3.375 g 12.5 mL/hr over 240 Minutes Intravenous 3 times per day 10/30/12 1838     10/30/12 1430  vancomycin (VANCOCIN) IVPB 1000 mg/200 mL premix     1,000 mg 200 mL/hr over 60 Minutes Intravenous  Once 10/30/12 1416 10/30/12 1919   10/30/12 1430  piperacillin-tazobactam (ZOSYN) IVPB 3.375 g     3.375 g 12.5 mL/hr over 240 Minutes Intravenous  Once 10/30/12 1416 10/30/12 2038      Assessment: Fever, leukocytosis, tachypnea, tachycardia 72 yo male with end stage COPD and chronic indwelling CT for prolonged air leak. Had outpatient cultures growing alcaligenes, for which he was on levofloxacin x3 weeks prior to admission.    WBC 10.4 (down from 25.2 max) and afebrile. Renal function stable with SCr 0.80 and CrCl 88.65ml/min.  3.8 Vanco>> 3/8 Zosyn>>  3/8 Pleural fluid>> pending 3/8 BC x 2>> pending 3/8 UCx>> >100000 colonies mult. Bacteria.   Goal of Therapy:  Vancomycin trough level 15-20 mcg/ml  Plan:  Continue vancomycin 1g IV q12h. Trough soon if continued. Continue Zosyn 3.375g IV q8h infused over 4 hours  F/u renal function and cultures  F/u deescalation of antibiotics    Micheline Chapman PharmD Candidate 11/02/2012,10:16 AM

## 2012-11-02 NOTE — Progress Notes (Signed)
  Subjective: Right chest tube [place] without airleak draining small posterior loculated effusion-empyema Afebrile, white count has returned to normal on IV antibiotics Minimal fluid drainage through chest tube Patient remains extremely short of breath with exertion do to underlying COPD. It appears he should be okay for discharge home Thursday on oral Levaquin and I will follow in the office for chest tube management which hopefully can be slowly withdrawn and removed over the next few weeks.  Objective: Vital signs in last 24 hours: Temp:  [97.4 F (36.3 C)-98 F (36.7 C)] 97.4 F (36.3 C) (03/11 1330) Pulse Rate:  [72-98] 98 (03/11 1330) Cardiac Rhythm:  [-] Normal sinus rhythm (03/11 0048) Resp:  [18-20] 20 (03/11 1330) BP: (110-130)/(59-72) 130/72 mmHg (03/11 1330) SpO2:  [1 %-96 %] 94 % (03/11 1330)  Hemodynamic parameters for last 24 hours:   stable afebrile  Intake/Output from previous day: 03/10 0701 - 03/11 0700 In: 483 [P.O.:480; I.V.:3] Out: 1626 [Urine:1625; Stool:1] Intake/Output this shift: Total I/O In: 240 [P.O.:240] Out: 200 [Urine:200]  Exam  No air leak from chest tube  with minimal fluid drainage Lungs clear   Lab Results:  Recent Labs  10/31/12 0615 11/02/12 0805  WBC 17.8* 10.4  HGB 11.7* 11.0*  HCT 36.8* 34.2*  PLT 283 321   BMET:  Recent Labs  10/31/12 0615 11/02/12 0805  NA 138 140  K 3.8 3.7  CL 97 101  CO2 31 31  GLUCOSE 159* 110*  BUN 19 12  CREATININE 0.99 0.80  CALCIUM 8.6 9.0    PT/INR:  Recent Labs  10/31/12 0615  LABPROT 16.3*  INR 1.34   ABG    Component Value Date/Time   PHART 7.516* 08/18/2012 0828   HCO3 23.1 08/18/2012 0828   TCO2 24 08/18/2012 0828   ACIDBASEDEF 0.8 08/18/2012 0447   O2SAT 99.0 08/18/2012 0828   CBG (last 3)  No results found for this basename: GLUCAP,  in the last 72 hours  Assessment/Plan: S/P   Continue current care   convert chest tube to many express at discharge   LOS:  3 days    VAN TRIGT III,PETER 11/02/2012  And and

## 2012-11-02 NOTE — Op Note (Signed)
NAMEMARLO, Mark [name] NO.:  [id number]  MEDICAL RECORD NO.:  [id number]  LOCATION:  [facility]                         FACILITY:  [facility]  PHYSICIAN:  [name], M.D.  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  OPERATION:  Placement of right chest tube.  SURGEON:  [name], M.D.  ANESTHESIA:  Local, 1% lidocaine.  INDICATION:  The patient is a 72 year old gentleman with end-stage COPD on home oxygen, who underwent urgent bleb resection after developing recurrent spontaneous pneumothoraces with some component of tension pneumothorax.  After resecting several large blebs, he had persistent air leak, which did not close with conservative measures, and he was discharged home with a chest tube in place with the pneumothorax minimal less than 5%.  Over the ensuing 8 weeks, he had persistent air leak and a persistent small but steady serous drainage.  Recently, he presented to the emergency department with acute onset of shortness of breath and fever.  It was felt that his chest tube was possibly occluded and in fact, after looking the tube, a protein plug was expressed and there was some drainage of fluid and the patient felt better.  However, he was admitted to the hospital due to an elevated fever and white count for IV antibiotics and observation.  Following his admission, I followed up with the patient visit today and recommended that the chest tube be replaced with a new slightly smaller but  clean chest tube.  A CT scan had been performed, which I reviewed, which showed a possible basilar small empyema right in the area of the previously placed tube, which was now partially obstructed with proteinaceous plugging.  The patient agreed to undergo chest tube exchange under local anesthesia in this hospital room.  Informed consent was obtained.  DESCRIPTION OF PROCEDURE:  The patient's chest was prepped and draped in a sterile field.  A proper time-out was performed after informed consent.  Lidocaine 1% was infiltrated in the chest tube site which was clean and dry.  The suture was cut and removed.  A 32-French chest tube was removed and was full of proteinaceous plugging material.  A clean 28-French [place] chest tube was then inserted in the tract, and then secured to the skin and connected to an underwater seal Pleur-evac system.  Double silk sutures were used to secure the tube and the tube was taped and a sterile dressing was applied.     [name], M.D.     PV/MEDQ  D:  11/01/2012  T:  11/02/2012  Job:  [id number]

## 2012-11-03 ENCOUNTER — Inpatient Hospital Stay (HOSPITAL_COMMUNITY): Payer: Medicare Other

## 2012-11-03 LAB — CBC
HCT: 35.7 % — ABNORMAL LOW (ref 39.0–52.0)
MCH: 28.1 pg (ref 26.0–34.0)
MCV: 88.8 fL (ref 78.0–100.0)
RBC: 4.02 MIL/uL — ABNORMAL LOW (ref 4.22–5.81)
WBC: 12.4 10*3/uL — ABNORMAL HIGH (ref 4.0–10.5)

## 2012-11-03 LAB — BODY FLUID CULTURE: Culture: NO GROWTH

## 2012-11-03 NOTE — Progress Notes (Addendum)
FMTS Attending Admission Note: Janit Pagan MD I  have seen and examined this patient, reviewed their chart. I have discussed this patient with the resident. I agree with the resident's findings, assessment and care plan.

## 2012-11-03 NOTE — Progress Notes (Signed)
PGY-1 Daily Progress Note Family Medicine Teaching Service Service Pager: [phone]   Subjective: continues to do well. States breathing is about the same. Not too much pain associated with CT site. Doesn't feel like he needs any pain meds for this. States belly did well overnight following BM, but that it feels like theres pressure building up now.  Objective:  Temp:  [97.4 F (36.3 C)-97.9 F (36.6 C)] 97.9 F (36.6 C) (03/12 0452) Pulse Rate:  [88-99] 88 (03/12 0452) Resp:  [18-20] 18 (03/12 0452) BP: (116-145)/(62-72) 116/72 mmHg (03/12 0452) SpO2:  [1 %-95 %] 95 % (03/12 0452)  Intake/Output Summary (Last 24 hours) at 11/03/12 0729 Last data filed at 11/03/12 0539  Gross per 24 hour  Intake   1260 ml  Output    201 ml  Net   1059 ml   Physical exam:  Gen:  NAD resting comfortably in bed with 4L [place] CV: rrr, no mrg PULM:  CTAB, no wheezes or crackles, right chest tube in place, clean bandage, yellow fluid  ABD: slightly distended, pain better with pressure Neuro: Alert, grossly normal.   Labs and imaging:   CBC  Recent Labs Lab 10/30/12 1855 10/31/12 0615 11/02/12 0805  WBC 24.5* 17.8* 10.4  HGB 12.8* 11.7* 11.0*  HCT 39.7 36.8* 34.2*  PLT 313 283 321   BMET/CMET  Recent Labs Lab 10/30/12 1209 10/30/12 1855 10/31/12 0615 11/02/12 0805  NA 138  --  138 140  K 4.5  --  3.8 3.7  CL 98  --  97 101  CO2 29  --  31 31  BUN 21  --  19 12  CREATININE 0.93 1.04 0.99 0.80  CALCIUM 9.3  --  8.6 9.0  PROT 7.5  --   --   --   BILITOT 0.5  --   --   --   ALKPHOS 52  --   --   --   ALT 12  --   --   --   AST 12  --   --   --   GLUCOSE 133*  --  159* 110*   BCx NGTD UCx multiple morphotypes Pleural Fluid Cx NG x 3 day  Ct Chest W Contrast  11/01/2012 IMPRESSION:  1.  Multiloculated small right base empyema. 2.  Almost complete resolution of the right pneumothorax. 3.  Severe emphysema.   Original Report Authenticated By: Francene Boyers, M.D.  Dg Chest 2  View  11/03/2012  IMPRESSION: Stable position of the right chest tube and stable small right pneumothorax.  Stable postsurgical changes in the right lung.   Original Report Authenticated By: Richarda Overlie, M.D.    Dg Chest 2 View  11/02/2012 IMPRESSION: No significant change in the minimal right apical pneumothorax compared to prior exam.  Right-sided chest tube is unchanged in position.  Mild right pleural effusion is noted and unchanged.   Original Report Authenticated By: Lupita Raider.,  M.D.      Ct Abdomen Pelvis W Contrast  11/02/2012  IMPRESSION: No acute intra-abdominal or pelvic pathology.   Original Report Authenticated By: Christiana Pellant, M.D.         I have reviewed the patient's current medications. Scheduled: . albuterol  2.5 mg Nebulization QID  . aspirin EC  81 mg Oral Daily  . budesonide-formoterol  2 puff Inhalation BID  . heparin  5,000 Units Subcutaneous Q8H  . ipratropium  0.5 mg Nebulization QID  . piperacillin-tazobactam (ZOSYN)  IV  3.375 g Intravenous Q8H  . polyethylene glycol  17 g Oral Daily  . predniSONE  20 mg Oral Daily  . propafenone  225 mg Oral BID  . sodium chloride  3 mL Intravenous Q12H  . vancomycin  1,000 mg Intravenous Q12H  . verapamil  240 mg Oral QHS   Continuous:  QIO:[id number], acetaminophen, albuterol, alum & mag hydroxide-simeth, guaiFENesin, ipratropium, ondansetron (ZOFRAN) IV, ondansetron, traMADol  Assessment/Plan:   72 yo M with chronic indwelling chest tube admitted with clotted chest tube, dyspnea, fever, leukocytosis  1. Sepsis: Patient with fever, leukocytosis, tachypnea, tachycardia and likely source from chest tube. Lactic acid normal.   - WBC improved today and patient afebrile, breathing improved as well.  - Blood, urine and pleural fluid cultures all show no source at this time - Continue Vancomycin and Zosyn. Will plan to transition to levaquin tomorrow per CT surgery recs. Appreciate their help in this case. -CT  replaced and draining well  2. COPD: Known end stage COPD. - Continue O2 to keep sats >88%. On home oxygen.  - Continue home Symbicort - Duonebs q 6 hours prn - Guaifenesin as needed for cough - Continue Prednisone 20mg  daily  3. Abdominal pain: Unknown etiology, likely related to consitpation given relief with BM - continue malox - Last bowel movement 3/11 which gave some relief of pain.  - KUB unremarkable - CT abd and pelvis unremarkable - schedule miralax given relief with BM  4. Afib: Currently in NSR - Continue home dose of Verapamil and propafenone - Monitor on telemetry  5. Anemia: patient with Hgb to 11 today. Appears to be near baseline of 11-12. Normal MCV. All other cell lines stable. -question of whether this could be related to chronic disease/COPD -FOBT negative -will follow with CBC  FEN/GI- heart healthy diet PPX- Heparin Sub Q Dispo- likely discharge tomorrow with PO levaquin   Marikay Alar, MD PGY1, Family Medicine Teaching Service [phone]

## 2012-11-03 NOTE — Progress Notes (Signed)
Physical Therapy Treatment Patient Details Name: Mark [name] MRN: [id number] DOB: [birth year] Today's Date: 11/03/2012 Time: 4098-1191 PT Time Calculation (min): 20 min  PT Assessment / Plan / Recommendation Comments on Treatment Session  Pt fatigued from being up all AM and going to x-ray, but agreeable to do a little PT.  Pt's o2 sats decreased to 86% with 8-12 feet of gait.  Discussed HHPT again with patient, and he continues to decline service.    Follow Up Recommendations  No PT follow up;Supervision/Assistance - 24 hour     Does the patient have the potential to tolerate intense rehabilitation     Barriers to Discharge        Equipment Recommendations  Other (comment)    Recommendations for Other Services    Frequency Min 3X/week   Plan Discharge plan remains appropriate    Precautions / Restrictions Precautions Precautions: Fall Precaution Comments: Patient with chronic chest tube   Pertinent Vitals/Pain o2 decreased to 86% with gait, increased to 91% within several minutes.    Mobility  Bed Mobility Left Sidelying to Sit: 6: Modified independent (Device/Increase time);With rails;HOB elevated Sitting - Scoot to Edge of Bed: 7: Independent Details for Bed Mobility Assistance: Pt has hospital bed at home that he raises [facility] for bed mobility. Transfers Transfers: Sit to Stand;Stand to Sit Sit to Stand: 5: Supervision;With upper extremity assist;From bed Stand to Sit: 5: Supervision;With upper extremity assist;To chair/3-in-1 Ambulation/Gait Ambulation/Gait Assistance: 4: Min guard Ambulation Distance (Feet): 12 Feet Assistive device: None Ambulation/Gait Assistance Details: Pt ambulated 8' with RW and CGA before sitting, then ambulated without RW 12' with CGA.  Pt states that using RW is more tiring for him.  O2 decreased to 86% with gait. Gait Pattern: Step-through pattern;Decreased stride length General Gait Details: cues for safety    Exercises General Exercises  - Lower Extremity Ankle Circles/Pumps: AROM;Both;20 reps Straight Leg Raises: Strengthening;Both;10 reps;Supine   PT Diagnosis:    PT Problem List:   PT Treatment Interventions:     PT Goals Acute Rehab PT Goals PT Goal: Supine/Side to Sit - Progress: Met PT Goal: Sit to Stand - Progress: Met PT Goal: Ambulate - Progress: Progressing toward goal  Visit Information  Last PT Received On: 11/03/12 Assistance Needed: +1    Subjective Data  Subjective: "I've been dealing with this for a while."   Cognition  Cognition Overall Cognitive Status: Appears within functional limits for tasks assessed/performed Arousal/Alertness: Awake/alert Orientation Level: Appears intact for tasks assessed Behavior During Session: [place] for tasks performed    Balance     End of Session PT - End of Session Equipment Utilized During Treatment: Oxygen Activity Tolerance: Patient limited by fatigue;Treatment limited secondary to medical complications (Comment);Other (comment) (o2 sats, dyspnea) Patient left: in chair;with call bell/phone within reach Nurse Communication: Mobility status;Other (comment) (o2 sats)   GP     Mark [name] 11/03/2012, 10:29 AM

## 2012-11-04 ENCOUNTER — Ambulatory Visit: Payer: Medicare Other | Admitting: Internal Medicine

## 2012-11-04 DIAGNOSIS — J9382 Other air leak: Secondary | ICD-10-CM

## 2012-11-04 LAB — CBC
HCT: 32.4 % — ABNORMAL LOW (ref 39.0–52.0)
Hemoglobin: 10.3 g/dL — ABNORMAL LOW (ref 13.0–17.0)
MCH: 28 pg (ref 26.0–34.0)
MCHC: 31.8 g/dL (ref 30.0–36.0)
MCV: 88 fL (ref 78.0–100.0)

## 2012-11-04 MED ORDER — LEVOFLOXACIN 750 MG PO TABS: 750.0000 mg | ORAL_TABLET | Freq: Every day | ORAL | Status: DC

## 2012-11-04 MED ORDER — ALUM & MAG HYDROXIDE-SIMETH 200-200-20 MG/5ML PO SUSP: 15.0000 mL | Freq: Four times a day (QID) | ORAL | Status: DC | PRN

## 2012-11-04 MED ORDER — OMEPRAZOLE 20 MG PO CPDR: 20.0000 mg | DELAYED_RELEASE_CAPSULE | Freq: Two times a day (BID) | ORAL | Status: AC

## 2012-11-04 MED ORDER — LEVOFLOXACIN 750 MG PO TABS
750.0000 mg | ORAL_TABLET | Freq: Every day | ORAL | Status: DC
  Administered 2012-11-04: 750 mg via ORAL
  Filled 2012-11-04: qty 1

## 2012-11-04 NOTE — Progress Notes (Signed)
PGY-1 Daily Progress Note Family Medicine Teaching Service Service Pager: [phone]   Subjective: continues to do well. Breathing stable. Denies pain. States belly feeling ok though starting to have gas build up.  Objective:  Temp:  [97.6 F (36.4 C)-97.8 F (36.6 C)] 97.8 F (36.6 C) (03/13 0455) Pulse Rate:  [83-102] 83 (03/13 0455) Resp:  [20-21] 20 (03/12 2110) BP: (110-122)/(73-88) 110/73 mmHg (03/13 0455) SpO2:  [91 %-96 %] 94 % (03/13 0854)  Intake/Output Summary (Last 24 hours) at 11/04/12 0934 Last data filed at 11/04/12 0808  Gross per 24 hour  Intake   1380 ml  Output    951 ml  Net    429 ml   Physical exam:  Gen:  NAD resting comfortably in bed with 3L [place] CV: rrr, no mrg PULM:  CTAB, no wheezes or crackles, right chest tube in place, clean bandage, yellow fluid  ABD: slightly distended, no complaint of pain Neuro: Alert, grossly normal.   Labs and imaging:   CBC  Recent Labs Lab 11/02/12 0805 11/03/12 0852 11/04/12 0510  WBC 10.4 12.4* 10.1  HGB 11.0* 11.3* 10.3*  HCT 34.2* 35.7* 32.4*  PLT 321 363 348   BMET/CMET  Recent Labs Lab 10/30/12 1209 10/30/12 1855 10/31/12 0615 11/02/12 0805  NA 138  --  138 140  K 4.5  --  3.8 3.7  CL 98  --  97 101  CO2 29  --  31 31  BUN 21  --  19 12  CREATININE 0.93 1.04 0.99 0.80  CALCIUM 9.3  --  8.6 9.0  PROT 7.5  --   --   --   BILITOT 0.5  --   --   --   ALKPHOS 52  --   --   --   ALT 12  --   --   --   AST 12  --   --   --   GLUCOSE 133*  --  159* 110*   BCx NGTD UCx multiple morphotypes Pleural Fluid Cx NG final  Ct Chest W Contrast  11/01/2012 IMPRESSION:  1.  Multiloculated small right base empyema. 2.  Almost complete resolution of the right pneumothorax. 3.  Severe emphysema.   Original Report Authenticated By: Francene Boyers, M.D.  Dg Chest 2 View  11/03/2012  IMPRESSION: Stable position of the right chest tube and stable small right pneumothorax.  Stable postsurgical changes in the  right lung.   Original Report Authenticated By: Richarda Overlie, M.D.    Dg Chest 2 View  11/02/2012 IMPRESSION: No significant change in the minimal right apical pneumothorax compared to prior exam.  Right-sided chest tube is unchanged in position.  Mild right pleural effusion is noted and unchanged.   Original Report Authenticated By: Lupita Raider.,  M.D.      Ct Abdomen Pelvis W Contrast  11/02/2012  IMPRESSION: No acute intra-abdominal or pelvic pathology.   Original Report Authenticated By: Christiana Pellant, M.D.     I have reviewed the patient's current medications. Scheduled: . albuterol  2.5 mg Nebulization QID  . aspirin EC  81 mg Oral Daily  . budesonide-formoterol  2 puff Inhalation BID  . heparin  5,000 Units Subcutaneous Q8H  . ipratropium  0.5 mg Nebulization QID  . piperacillin-tazobactam (ZOSYN)  IV  3.375 g Intravenous Q8H  . polyethylene glycol  17 g Oral Daily  . predniSONE  20 mg Oral Daily  . propafenone  225 mg Oral BID  .  sodium chloride  3 mL Intravenous Q12H  . vancomycin  1,000 mg Intravenous Q12H  . verapamil  240 mg Oral QHS   Continuous:  ZOX:[id number], acetaminophen, albuterol, alum & mag hydroxide-simeth, guaiFENesin, ipratropium, ondansetron (ZOFRAN) IV, ondansetron, traMADol  Assessment/Plan:   72 yo M with chronic indwelling chest tube admitted with clotted chest tube, dyspnea, fever, leukocytosis  1. Sepsis: Patient with fever, leukocytosis, tachypnea, tachycardia and likely source from chest tube. Lactic acid normal.   - WBC improved today and patient afebrile, breathing improved as well.  - Blood, urine and pleural fluid cultures all show no source at this time - will transition to PO levaquin today - CT replaced and draining well  2. COPD: Known end stage COPD. - Continue O2 to keep sats >88%. On home oxygen.  - Continue home Symbicort - Duonebs q 6 hours prn - Guaifenesin as needed for cough - Continue Prednisone 20mg  daily  3.  Abdominal pain: Unknown etiology, possibly related to IBS - continue malox - Last bowel movement 3/11 which gave some relief of pain.  - KUB unremarkable - CT abd and pelvis unremarkable - schedule miralax given relief with BM - patient requests PPI, will start at discharge  4. Afib: Currently in NSR - Continue home dose of Verapamil and propafenone - Monitor on telemetry  5. Anemia: patient with Hgb to 11 today. Appears to be near baseline of 11-12. Normal MCV. All other cell lines stable. -question of whether this could be related to chronic disease/COPD -FOBT negative -will follow with CBC  FEN/GI- heart healthy diet PPX- Heparin Sub Q Dispo- discharge today   Marikay Alar, MD PGY1, Family Medicine Teaching Service [phone]

## 2012-11-04 NOTE — Progress Notes (Signed)
FMTS Attending Admission Note: Mark [name] I  have seen and examined this patient, reviewed their chart. I have discussed this patient with the resident. I agree with the resident's findings, assessment and care plan.  NB: Ensure follow up with gastroenterologist for his bowel issue as well as his health maintenance (Colonoscopy).To also follow up with his PMD for is mild anemia.

## 2012-11-04 NOTE — Care Management Note (Signed)
    Page 1 of 1   11/04/2012     1:52:11 PM   CARE MANAGEMENT NOTE 11/04/2012  Patient:  Mark [name], Mark [name]   Account Number:  [id number]  Date Initiated:  11/03/2012  Documentation initiated by:  AMERSON,JULIE  Subjective/Objective Assessment:   PT ADM ON 10/30/12 WITH SEPSIS, OBSTRUCTED CHEST TUBE.  PTA, PT RESIDED AT HOME WITH WIFE.  HE IS ON CHRONIC HOME OXYGEN, AND HAS HX OF [facility] FOR HH CARE.     Action/Plan:   MET WITH PT TO DISCUSS HOME NEEDS.  PT STATES HE FEELS HE WOULD NEED HHRN TO FOLLOW BACK UP WITH HIM AT DC.  WILL ASK MD FOR ORDER FOR HHRN.   Anticipated DC Date:  11/04/2012   Anticipated DC Plan:  HOME W HOME HEALTH SERVICES      DC Planning Services  CM consult      [facility] Choice  HOME HEALTH   Choice offered to / List presented to:  C-1 Patient        HH arranged  HH-1 RN      [facility] agency  [facility]   Status of service:  Completed, signed off Medicare Important Message given?   (If response is "NO", the following Medicare IM given date [place] will be blank) Date Medicare IM given:   Date Additional Medicare IM given:    Discharge Disposition:  HOME W HOME HEALTH SERVICES  Per UR Regulation:  Reviewed for med. necessity/level of care/duration of stay  If discussed at Long Length of Stay Meetings, dates discussed:    Comments:  11/04/12 JULIE AMERSON,RN,BSN [phone] PT FOR DC HOME TODAY WITH WIFE.  REFERRAL TO [facility] FOR HHRN FOLLOW UP.  START OF CARE 24-48H POST DC.

## 2012-11-04 NOTE — Progress Notes (Addendum)
11/04/2012 1:32 PM Nursing note Dr. Donata Clay switched chest tube on pt. From [place] back to mini express this am. Dressing, mini express canister and site assessed prior to discharge. No air leak noted at this time.  Discharge avs form, medications already taken today and those due this evening given and explained to patient and wife. Location of called in RX and discontinued medications reviewed. Follow up appointments and when to call MD reviewed. Chest tube site care reviewed. Pt. And wife verbalized understanding of chest tube care, dressing care, and site management.  Confirmation pt. HHRN setup with [facility] per prior arrangements prior to discharge home as well as adequate oxygen supply at home.  Pt. Placed on home o2 tank at 3L per home regimen prior to discharge. D/c iv line. D/c tele. D/c home per orders.  Flippin, Blanchard Kelch

## 2012-11-04 NOTE — Discharge Summary (Signed)
Physician Discharge Summary  Patient ID: Mark [name] MRN: [id number] DOB: 22-Jan-1941 Age: 72 y.o.  Admit date: 10/30/2012 Discharge date: 11/04/2012 Admitting Physician: Sanjuana Letters, MD  PCP: [name], MD  Consultants: CT surgery, Dr. Donata Clay  Discharge Diagnosis: Principal Problem:   Sepsis Active Problems:   HYPERLIPIDEMIA   COPD with chronic bronchitis-oxygen dependent   Emphysema   S/P thoracotomy   Clotted chest tube    Hospital Course 72 yo M with chronic indwelling chest tube admitted with clotted chest tube, dyspnea, fever, leukocytosis   1. Sepsis: Patient with fever, leukocytosis, tachypnea, tachycardia and likely source from chest tube. Lactic acid normal. Obtained cultures of blood, urine, and pleural fluid that revealed no source. CT chest revealed empyema. Patient was started on vanc and zosyn and had chest tube replaced as it was clotted with fibrinous material. Had improvement in WBC, fever, and respiratory status with this. Patient was transitioned to PO levaquin and tolerated this well. Patient was stable at time of discharge.  2. COPD: Known end stage COPD. Continued on O2 and was on home level at time of discharge. Continued home symbicort. Additionally duonebs q6 hours prn and guaifenesin and prednisone.   3. Abdominal pain: abdominal pain associated with bloating relieved by BM and maalox/ KUB unremarkable and CT abd/pelvis unremarkable. Had scheduled miralax. Started on PPI at discharge. Felt that this is likely related to some component of IBS.   4. Afib: In NSR during admission. Continued home verapamil and propafenone.    5. Anemia: patient with Hgb to 11. Appears to be near baseline of 11-12. Normal MCV. All other cell lines stable. Likely related to chronic disease and COPD. FOBT negative.  Problem List 1. Sepsis 2. Clotted chest tube 3. Abdominal pain 4. afib 5. Anemia 6. COPD          Discharge PE   Filed Vitals:    11/04/12 0455  BP: 110/73  Pulse: 83  Temp: 97.8 F (36.6 C)  Resp:    Gen: NAD resting comfortably in bed with 3L [place]  CV: rrr, no mrg  PULM: CTAB, no wheezes or crackles, right chest tube in place, clean bandage, yellow fluid  ABD: slightly distended, no complaint of pain  Neuro: Alert, grossly normal.    Procedures/Imaging:  Dg Chest 2 View  11/03/2012  *RADIOLOGY REPORT*  Clinical Data: Shortness of breath.  Chest tube.  CHEST - 2 VIEW  Comparison: 11/02/2012  Findings: There is a stable small right apical pneumothorax. Stable postsurgical changes in the right lung.  Stable blunting at the right lung base.  The right chest tube remains at the right lung base and unchanged in position.  Left lung is clear without pneumothorax.  Stable appearance of the heart and mediastinum.  IMPRESSION: Stable position of the right chest tube and stable small right pneumothorax.  Stable postsurgical changes in the right lung.   Original Report Authenticated By: Richarda Overlie, M.D.    Dg Chest 2 View  11/02/2012  *RADIOLOGY REPORT*  Clinical Data: Shortness of breath.  CHEST - 2 VIEW  Comparison: October 31, 2012.  Findings: Cardiomediastinal silhouette appears normal.  Right-sided chest tube is unchanged in position.  Minimal right apical pneumothorax is unchanged compared to prior exam.  Continued irregular density is noted in right upper lobe consistent with scarring or subsegmental atelectasis.  No change is seen involving mild right pleural effusion compared to prior exam.  No significant abnormality seen in left lung.  IMPRESSION:  No significant change in the minimal right apical pneumothorax compared to prior exam.  Right-sided chest tube is unchanged in position.  Mild right pleural effusion is noted and unchanged.   Original Report Authenticated By: Lupita Raider.,  M.D.    Dg Chest 2 View  10/30/2012  *RADIOLOGY REPORT*  Clinical Data: Shortness of breath, chest pressure, right chest tube  CHEST - 2 VIEW   Comparison: 10/27/2012  Findings: Indwelling right chest tube positioned medially on the right lower hemithorax.  Small right apical pneumothorax, unchanged.  Chronic interstitial markings/emphysematous changes.  Right basilar opacity, likely atelectasis.  The heart is normal in size.  Mild degenerative changes of the visualized thoracolumbar spine.  IMPRESSION: Stable right chest tube.  Small right apical pneumothorax, unchanged.   Original Report Authenticated By: Charline Bills, M.D.    Dg Chest 2 View  10/27/2012  *RADIOLOGY REPORT*  Clinical Data: Chest tube, shortness of breath  CHEST - 2 VIEW  Comparison: 10/20/2012  Findings: Right thoracostomy tube noted. Normal heart size, mediastinal contours, and pulmonary vascularity. Emphysematous and bronchitic changes with stable postoperative scarring right lung. Residual apical, lateral and basilar right pneumothorax unchanged. Minimal scarring left mid lung. No acute infiltrate, pleural effusion or pneumothorax. No acute osseous findings.  IMPRESSION: COPD with postsurgical changes of the right hemithorax and minimal left lung scarring. Persistent small right pneumothorax despite right thoracostomy tube.   Original Report Authenticated By: Ulyses Southward, M.D.    Ct Chest W Contrast  11/01/2012  *RADIOLOGY REPORT*  Clinical Data: Fever. Leukocytosis. Shortness of breath.  CT CHEST WITH CONTRAST  Technique:  Multidetector CT imaging of the chest was performed following the standard protocol during bolus administration of intravenous contrast.  Contrast: 80mL OMNIPAQUE IOHEXOL 300 MG/ML  SOLN  Comparison: Chest x-ray dated 10/31/2012 and chest CT dated 08/26/2012  Findings: There is fluid loculated posteriorly and laterally at the right lung base particularly around the right chest tube with just inside the rib cage. There are multiple bubbles of air in this multi-loculated fluid consistent with a small empyema.  The pneumothorax has almost completely resolved.   The patient does have severe emphysema.  There is also slight accentuation of the chronic interstitial disease at the right lung base posteriorly.  Heart size and vascularity are normal. The chest tube tip is slightly retracted as compared to the prior study.  IMPRESSION:  1.  Multiloculated small right base empyema. 2.  Almost complete resolution of the right pneumothorax. 3.  Severe emphysema.   Original Report Authenticated By: Francene Boyers, M.D.    Ct Abdomen Pelvis W Contrast  11/02/2012  *RADIOLOGY REPORT*  Clinical Data: Abdominal pain, melena  CT ABDOMEN AND PELVIS WITH CONTRAST  Technique:  Multidetector CT imaging of the abdomen and pelvis was performed following the standard protocol during bolus administration of intravenous contrast.  Contrast: 80mL OMNIPAQUE IOHEXOL 300 MG/ML  SOLN  Comparison: 09/12/2003, chest radiograph 11/02/2012  Findings: Diffuse and degenerative changes are noted.  Trace right sided pneumothorax and bilateral trace pleural effusions persist. Right-sided chest tube remains in place, terminating at the anteromedial right lung but incompletely imaged on the axial views. Minimal bibasilar dependent probable atelectasis and/or scarring noted.  Low density renal cortical lesions are identified bilaterally, many too small to characterize but likely cysts.  1.9 cm left upper renal pole cortical cyst identified image 32.  Adrenal glands, liver, gallbladder, spleen, and pancreas are normal.  Moderate atherosclerotic aortic calcification without aneurysm.  The appendix is surgically  absent.  No bowel wall thickening, focal segmental dilatation, or abnormal air-fluid level. The bladder is normal.  Coarse prostatic calcifications noted.  No free air or fluid.  No lymphadenopathy.  Lumbar spine degenerative change identified.  IMPRESSION: No acute intra-abdominal or pelvic pathology.   Original Report Authenticated By: Christiana Pellant, M.D.    Portable Chest 1 View  10/31/2012   *RADIOLOGY REPORT*  Clinical Data: Chest tube  PORTABLE CHEST - 1 VIEW  Comparison: 10/30/2012  Findings: Right basilar chest tube is unchanged.  Small right apical pneumothorax, unchanged.  There is a small right effusion and mild right lower lobe airspace disease, unchanged.  COPD with hyperinflation and scarring in the lungs.  IMPRESSION: Small right apical pneumothorax unchanged.  Small right effusion and right lower lobe airspace disease is unchanged.   Original Report Authenticated By: Janeece Riggers, M.D.    Dg Abd 2 Views  10/31/2012  *RADIOLOGY REPORT*  Clinical Data: Abdominal pain.  ABDOMEN - 2 VIEW  Comparison: CTA of the chest, abdomen and pelvis performed 09/12/2003  Findings: The visualized bowel gas pattern is unremarkable. Scattered air and stool filled loops of colon are seen; no abnormal dilatation of small bowel loops is seen to suggest small bowel obstruction.  No free intra-abdominal air is identified on the provided decubitus view.  The visualized osseous structures are within normal limits; the sacroiliac joints are unremarkable in appearance.  The visualized lung bases are essentially clear.  IMPRESSION: Unremarkable bowel gas pattern; no free intra-abdominal air seen.   Original Report Authenticated By: Tonia Ghent, M.D.     Labs  CBC  Recent Labs Lab 11/02/12 0805 11/03/12 0852 11/04/12 0510  WBC 10.4 12.4* 10.1  HGB 11.0* 11.3* 10.3*  HCT 34.2* 35.7* 32.4*  PLT 321 363 348   BMET  Recent Labs Lab 10/30/12 1209 10/30/12 1855 10/31/12 0615 11/02/12 0805  NA 138  --  138 140  K 4.5  --  3.8 3.7  CL 98  --  97 101  CO2 29  --  31 31  BUN 21  --  19 12  CREATININE 0.93 1.04 0.99 0.80  CALCIUM 9.3  --  8.6 9.0  PROT 7.5  --   --   --   BILITOT 0.5  --   --   --   ALKPHOS 52  --   --   --   ALT 12  --   --   --   AST 12  --   --   --   GLUCOSE 133*  --  159* 110*   Results for orders placed during the hospital encounter of 10/30/12 (from the past 72  hour(s))  CBC     Status: Abnormal   Collection Time    11/02/12  8:05 AM      Result Value Range   WBC 10.4  4.0 - 10.5 K/uL   RBC 3.86 (*) 4.22 - 5.81 MIL/uL   Hemoglobin 11.0 (*) 13.0 - 17.0 g/dL   HCT 40.9 (*) 81.1 - 91.4 %   MCV 88.6  78.0 - 100.0 fL   MCH 28.5  26.0 - 34.0 pg   MCHC 32.2  30.0 - 36.0 g/dL   RDW 78.2  95.6 - 21.3 %   Platelets 321  150 - 400 K/uL  BASIC METABOLIC PANEL     Status: Abnormal   Collection Time    11/02/12  8:05 AM      Result Value Range  Sodium 140  135 - 145 mEq/L   Potassium 3.7  3.5 - 5.1 mEq/L   Chloride 101  96 - 112 mEq/L   CO2 31  19 - 32 mEq/L   Glucose, Bld 110 (*) 70 - 99 mg/dL   BUN 12  6 - 23 mg/dL   Creatinine, Ser 4.54  0.50 - 1.35 mg/dL   Calcium 9.0  8.4 - 09.8 mg/dL   GFR calc non Af Amer 87 (*) >90 mL/min   GFR calc Af Amer >90  >90 mL/min   Comment:            The eGFR has been calculated     using the CKD EPI equation.     This calculation has not been     validated in all clinical     situations.     eGFR's persistently     <90 mL/min signify     possible Chronic Kidney Disease.  OCCULT BLOOD X 1 CARD TO LAB, STOOL     Status: None   Collection Time    11/02/12  2:20 PM      Result Value Range   Fecal Occult Bld NEGATIVE  NEGATIVE  CBC     Status: Abnormal   Collection Time    11/03/12  8:52 AM      Result Value Range   WBC 12.4 (*) 4.0 - 10.5 K/uL   RBC 4.02 (*) 4.22 - 5.81 MIL/uL   Hemoglobin 11.3 (*) 13.0 - 17.0 g/dL   HCT 11.9 (*) 14.7 - 82.9 %   MCV 88.8  78.0 - 100.0 fL   MCH 28.1  26.0 - 34.0 pg   MCHC 31.7  30.0 - 36.0 g/dL   RDW 56.2  13.0 - 86.5 %   Platelets 363  150 - 400 K/uL  CBC     Status: Abnormal   Collection Time    11/04/12  5:10 AM      Result Value Range   WBC 10.1  4.0 - 10.5 K/uL   RBC 3.68 (*) 4.22 - 5.81 MIL/uL   Hemoglobin 10.3 (*) 13.0 - 17.0 g/dL   HCT 78.4 (*) 69.6 - 29.5 %   MCV 88.0  78.0 - 100.0 fL   MCH 28.0  26.0 - 34.0 pg   MCHC 31.8  30.0 - 36.0 g/dL   RDW  28.4 (*) 13.2 - 15.5 %   Platelets 348  150 - 400 K/uL       Patient condition at time of discharge/disposition: stable  Disposition-home   Follow up issues: 1. Finish course of levaquin and follow-up with Dr. Donata Clay for management of CT 2. Anemia, recheck cbc to ensure that Hgb is stable  Discharge follow up:  Follow-up Information   Schedule an appointment as soon as possible for a visit with [name], MD.   Contact information:   [address] [place] [address] [phone]       Follow up with VAN Dinah Beers, MD. (please call to see when you are supposed to follow-up)    Contact information:   [address] [place] [address] [phone]       Future Appointments Provider Department Dept Phone   11/12/2012 2:00 PM [name], MD [facility] [phone]   12/09/2012 10:45 AM Waymon Budge, MD [facility] [phone]     Discharge Instructions: Please refer to Patient Instructions section of EMR  for full details.  Patient was counseled important signs and symptoms that should prompt return to medical care, changes in medications, dietary instructions, activity restrictions, and follow up appointments.   Discharge Medications   Medication List    STOP taking these medications       cephALEXin 500 MG capsule  Commonly known as:  KEFLEX      TAKE these medications       alendronate 70 MG tablet  Commonly known as:  FOSAMAX  Take 70 mg by mouth every 7 (seven) days. Takes on Tuesday  Take with a full glass of water on an empty stomach.     alum & mag hydroxide-simeth 200-200-20 MG/5ML suspension  Commonly known as:  MAALOX/MYLANTA  Take 15 mLs by mouth every 6 (six) hours as needed.     aspirin EC 81 MG tablet  Take 1 tablet (81 mg total) by mouth daily.     budesonide-formoterol 160-4.5 MCG/ACT inhaler  Commonly known as:  SYMBICORT  Inhale 2 puffs  into the lungs 2 (two) times daily. Rinse mouth     CENTRUM SILVER PO  Take 1 tablet by mouth daily.     EPINEPHrine 0.3 mg/0.3 mL Devi  Commonly known as:  EPI-PEN  Inject 0.3 mg into the skin daily as needed. For anaphylaxis     guaiFENesin 600 MG 12 hr tablet  Commonly known as:  MUCINEX  Take 1-2 mg by mouth every 12 (twelve) hours as needed. For cough     ipratropium-albuterol 0.5-2.5 (3) MG/3ML Soln  Commonly known as:  DUONEB  Take 3 mLs by nebulization every 6 (six) hours as needed (for shortness of breath or wheezing).     levofloxacin 750 MG tablet  Commonly known as:  LEVAQUIN  Take 1 tablet (750 mg total) by mouth daily.     magnesium oxide 400 MG tablet  Commonly known as:  MAG-OX  Take 1 tablet (400 mg total) by mouth daily.     omeprazole 20 MG capsule  Commonly known as:  PRILOSEC  Take 1 capsule (20 mg total) by mouth 2 (two) times daily.     ONE-A-DAY CALCIUM PLUS 500-50-100 MG-MG-UNIT Chew  Generic drug:  Calcium-Magnesium-Vitamin D  Chew 1 tablet by mouth 2 (two) times daily.     predniSONE 20 MG tablet  Commonly known as:  DELTASONE  Take 20 mg by mouth daily.     PROAIR HFA 108 (90 BASE) MCG/ACT inhaler  Generic drug:  albuterol  Inhale 2 puffs into the lungs every 4 (four) hours as needed. For shortness of breath/wheezing     albuterol (2.5 MG/3ML) 0.083% nebulizer solution  Commonly known as:  PROVENTIL  Take 3 mLs (2.5 mg total) by nebulization every 6 (six) hours as needed for wheezing.     propafenone 225 MG tablet  Commonly known as:  RYTHMOL  Take 225 mg by mouth 2 (two) times daily.     traMADol 50 MG tablet  Commonly known as:  ULTRAM  Take 50-100 mg by mouth every 6 (six) hours as needed for pain.     verapamil 240 MG CR tablet  Commonly known as:  CALAN-SR  Take 1 tablet (240 mg total) by mouth at bedtime.     vitamin B-12 250 MCG tablet  Commonly known as:  CYANOCOBALAMIN  Take 250 mcg by mouth daily.       Marikay Alar, MD of [facility] 11/05/2012 11:59 AM

## 2012-11-04 NOTE — Progress Notes (Signed)
  Subjective: Afebrile No chest tube drainage CXR stable Ready for DC home on po Levaquin Objective: Vital signs in last 24 hours: Temp:  [97.6 F (36.4 C)-97.8 F (36.6 C)] 97.8 F (36.6 C) (03/13 0455) Pulse Rate:  [83-102] 83 (03/13 0455) Cardiac Rhythm:  [-] Normal sinus rhythm;Heart block (03/13 0740) Resp:  [20-21] 20 (03/12 2110) BP: (110-122)/(73-88) 110/73 mmHg (03/13 0455) SpO2:  [91 %-96 %] 92 % (03/13 0455)  Hemodynamic parameters for last 24 hours:   nsr Intake/Output from previous day: 03/12 0701 - 03/13 0700 In: 1620 [P.O.:1320; IV Piggyback:300] Out: 951 [Urine:950; Stool:1] Intake/Output this shift: Total I/O In: 120 [P.O.:120] Out: -   Lungs clear  Lab Results:  Recent Labs  11/03/12 0852 11/04/12 0510  WBC 12.4* 10.1  HGB 11.3* 10.3*  HCT 35.7* 32.4*  PLT 363 348   BMET:  Recent Labs  11/02/12 0805  NA 140  K 3.7  CL 101  CO2 31  GLUCOSE 110*  BUN 12  CREATININE 0.80  CALCIUM 9.0    PT/INR: No results found for this basename: LABPROT, INR,  in the last 72 hours ABG    Component Value Date/Time   PHART 7.516* 08/18/2012 0828   HCO3 23.1 08/18/2012 0828   TCO2 24 08/18/2012 0828   ACIDBASEDEF 0.8 08/18/2012 0447   O2SAT 99.0 08/18/2012 0828   CBG (last 3)  No results found for this basename: GLUCAP,  in the last 72 hours  Assessment/Plan: S/P   Plan for discharge: see discharge orders   LOS: 5 days    VAN TRIGT III,PETER 11/04/2012

## 2012-11-05 DIAGNOSIS — A419 Sepsis, unspecified organism: Secondary | ICD-10-CM | POA: Diagnosis present

## 2012-11-05 NOTE — Discharge Summary (Signed)
FMTS Attending Admission Note: Mark Eniola,MD Patient was seen and examined by dr Mark [name], I reviewed their chart. I have discussed this patient with the resident. I agree with the resident's findings, assessment and care plan.

## 2012-11-08 LAB — CULTURE, BLOOD (ROUTINE X 2): Culture: NO GROWTH

## 2012-11-09 ENCOUNTER — Encounter: Payer: Self-pay | Admitting: Internal Medicine

## 2012-11-09 ENCOUNTER — Ambulatory Visit (INDEPENDENT_AMBULATORY_CARE_PROVIDER_SITE_OTHER): Payer: Medicare Other | Admitting: Internal Medicine

## 2012-11-09 VITALS — BP 124/76 | HR 98 | Ht 71.0 in | Wt 169.6 lb

## 2012-11-09 DIAGNOSIS — J9383 Other pneumothorax: Secondary | ICD-10-CM

## 2012-11-09 DIAGNOSIS — J449 Chronic obstructive pulmonary disease, unspecified: Secondary | ICD-10-CM

## 2012-11-09 DIAGNOSIS — J309 Allergic rhinitis, unspecified: Secondary | ICD-10-CM

## 2012-11-09 DIAGNOSIS — J3089 Other allergic rhinitis: Secondary | ICD-10-CM

## 2012-11-09 NOTE — Patient Instructions (Addendum)
Suggest otc decongestant like phenylephrine/ Sudafed-PE  Keep appointment with Dr Maren Beach this week and discuss function of chest tube with him.  Ok to continue present meds. Please call as needed.

## 2012-11-09 NOTE — Progress Notes (Signed)
Patient ID: Mark [name], male    DOB: 11-09-1940, 72 y.o.   MRN: [id number]  HPI 01/17/11- 72 yo with respiratory failure/ COPD, chronic AFib. Wife here Last here 11/04/10- Note reviewed Got a cold 2 weeks ago. Initally better. In last week-10 days has had more malaise, cough esp in last 3 days. Last antibiotic 2-3 months ago.  Went back up to 20 mg daily prednisone. Was never able to wean off last year when we tried. He has been on steroids many years..   05/07/11-  50 yo M former 3 PPD smoker with respiratory failure/ COPD, chronic AFib. Wife here No major changes. Uses nebulizer 3-5x/ day, infrequent use of rescue inhaler.Stays on prednisone 20 mg daily. He couldn't function at 15 mg daily.No recent infection. Fought off a mild cold a few weeks ago. He used his standby doxy for that and feels it helped. Daily mucinex. Daily cough with some phlegm. For flu vax today. Oxygen continuous- 1.5 - 2 L/M.  In past few weeks has noted pain down from right hip to ankle- discussed as likely pinched nerve/ compression fx from steroids. Spends much of each day sitting.   11/05/11-70 yo M former 3 PPD smoker with respiratory failure/ COPD/ steroid dependent, chronic AFib. Wife here. PCP Dr [name] Had chest cold at [facility] but finally better in past month. Remains on prednisone 20 mg daily. He has been able to be much more active, feeling better. Up and around more in house on O2 2 L/M with less need for wheelchair and with no acute issues. Denies infection, blood, chest pain. Had injections for back pain.   05/07/12- 28 yo M former 3 PPD smoker with respiratory failure/ COPD/ steroid dependent, chronic AFib.   Wife here.  PCP Dr [name] Denies any SOB, wheezing, cough, or congestion for about the past 2 weeks Remains on oxygen 3 L/[employer].  They plan to fly to [facility] for a family wedding. I filled out the airplane form. They will be taking a portable concentrator. We discussed oxygen, altitude and  exertion. He remains steroid dependent 20 mg prednisone daily.  08/10/12- 39 yo M former 3 PPD smoker with respiratory failure/ COPD/ steroid dependent, chronic AFib.   Wife here.  PCP Dr [name] ACUTE VISIT: since Sunday having a hard time keep in O2 levels up even with O2; no energy and unable to do much activites. Came back in wheelchair and O2 was 86% on 3l/m cont. Had had a cold and started doxycycline yesterday. Denies fever,  chest pain, palpitation, little cough, no blood, no edema. Some pain across upper back and shoulders.  09/28/12- 24 yo M former 3 PPD smoker with respiratory failure/ COPD/ steroid dependent, chronic AFib.   Wife here.  Post Hospital: No more than usual SOB or wheezing since being in hospital prolonged stay 12/17-1/7 for prolonged R spontaneous PTX with chest tube.  Chest tube is still in with a drain, followed by Dr Donata Clay. Breathing is better. Antibiotics are finished. Continues oxygen 2 L. CT chest 08/26/12-  IMPRESSION:  1. Moderate right hydropneumothorax with right chest tube in  place, terminating in the anterior mediastinal region. Subcutaneous  emphysema along the right chest wall.  2. Postoperative changes in the right hemithorax with associated  soft tissue in the upper right hemithorax.  3. Centrilobular emphysema with possible superimposed airspace  disease in the right lower lobe. The latter is of unknown  chronicity and appears somewhat fibrotic.  Original Report Authenticated By: Leanna Battles, M.D. CXR 09/29/12 IMPRESSION:  1. Stable right-sided chest tube with persistent right-sided  pneumothorax.  2. Persistent right midlung density.  3. Stable severe underlying emphysematous changes.  Original Report Authenticated By: Rudie Meyer, M.D.  11/09/12- 31 yo M former 3 PPD smoker with respiratory failure/ COPD/ steroid dependent, chronic AFib.   Wife here.  FOLLOWS FOR: has had a hard time since last OV; was recently in hospital, admitted through  ED on 10/30/2012 when chest tube clotted. Fluid culture from tube was negative. He was treated for sepsis. Still feels weak/short of breath but pain is controlled. Little cough now and no fever or sweat CXR 11/03/12 IMPRESSION:  Stable position of the right chest tube and stable small right  pneumothorax.  Stable postsurgical changes in the right lung.  Original Report Authenticated By: Richarda Overlie, M.D.   Review of Systems- see HPI Constitutional:   No-   weight loss, night sweats, fevers, chills, fatigue, lassitude. HEENT:   No-  headaches, difficulty swallowing, tooth/dental problems, sore throat,       No-  sneezing, itching, ear ache, nasal congestion, post nasal drip,  CV:  No-   chest pain, orthopnea, PND, no recent swelling in lower extremities, no-anasarca, dizziness, palpitations Resp: + shortness of breath with exertion or at rest.              Little cough,  No-  coughing up of blood.              No-   change in color of mucus.  Slight wheezing.   Skin: No-   rash or lesions. GI:  No-   heartburn, indigestion, abdominal pain, nausea, vomiting, GU:  MS:  No-   joint pain or swelling.  + back pain- HPI Neuro- nothing unusual  Psych:  No- change in mood or affect. No depression or anxiety.  No memory loss.  Objective:   Physical Exam BP 124/76  Pulse 98  Ht 5\' 11"  (1.803 m)  Wt 169 lb 9.6 oz (76.93 kg)  BMI 23.66 kg/m2  SpO2 97%  General- Alert, Oriented, Affect-appropriate, Distress- none acute, wheelchair, O2   2 l/m . Skin- rash-none, lesions- none, excoriation- none. Steroid fragility and echymoses Lymphadenopathy- none Head- atraumatic            Eyes- Gross vision intact, PERRLA, conjunctivae clear secretions            Ears- Hearing, canals normal            Nose- Clear, No-Septal dev, mucus, polyps, erosion, perforation             Throat- Mallampati II , mucosa clear , drainage- none, tonsils- atrophic Neck- flexible , trachea midline, no stridor , thyroid nl,  carotid no bruit Chest - symmetrical excursion , unlabored           Heart/CV- almost regular to my exam (no pacemaker) , no murmur , no gallop  , no rub, nl s1 s2                            JVD- none , edema- trace, stasis changes- none, varices- none           Lung- +Very distant bilaterally, w/ diffuse slow expiratory wheeze, +loose cough , dullness-none, rub+ R.+,  Pursed lips.Marland Kitchen+Right [facility] chest tube draining yellow clear fluid. Bilateral breath sounds.  Chest wall-  Abd- Br/ Gen/ Rectal- Not done, not indicated Extrem- cyanosis- none, clubbing, none, atrophy- none, strength- nl Neuro- grossly intact to observation

## 2012-11-10 ENCOUNTER — Other Ambulatory Visit: Payer: Self-pay | Admitting: *Deleted

## 2012-11-10 DIAGNOSIS — J939 Pneumothorax, unspecified: Secondary | ICD-10-CM

## 2012-11-12 ENCOUNTER — Ambulatory Visit
Admission: RE | Admit: 2012-11-12 | Discharge: 2012-11-12 | Disposition: A | Discharge status: Home / Self Care | Payer: Medicare Other | Source: Ambulatory Visit | Attending: Cardiothoracic Surgery | Admitting: Cardiothoracic Surgery

## 2012-11-12 ENCOUNTER — Ambulatory Visit (INDEPENDENT_AMBULATORY_CARE_PROVIDER_SITE_OTHER): Payer: Self-pay | Admitting: Cardiothoracic Surgery

## 2012-11-12 VITALS — BP 129/81 | HR 124 | Resp 20 | Ht 71.0 in | Wt 169.0 lb

## 2012-11-12 DIAGNOSIS — J9383 Other pneumothorax: Secondary | ICD-10-CM

## 2012-11-12 DIAGNOSIS — J939 Pneumothorax, unspecified: Secondary | ICD-10-CM

## 2012-11-12 DIAGNOSIS — Z09 Encounter for follow-up examination after completed treatment for conditions other than malignant neoplasm: Secondary | ICD-10-CM

## 2012-11-12 DIAGNOSIS — J449 Chronic obstructive pulmonary disease, unspecified: Secondary | ICD-10-CM

## 2012-11-12 DIAGNOSIS — J9382 Other air leak: Secondary | ICD-10-CM

## 2012-11-12 NOTE — Progress Notes (Signed)
PCP is [name], MD Referring Provider is [name], *  Chief Complaint  Patient presents with  . Routine Post Op    2 week f/u with CXR    HPI: Severe COPD with chronic indwelling chest tube following baths with bleb resection and persistent airleak now resolved  Recent admission for probable empyema due to obstructed chest tube treated with replacement of chest tube and Levaquin  No airleak from current chest tube, minimal clear drainage. No fever. Patient completing oral course of Levaquin.   Past Medical History  Diagnosis Date  . Atrial fibrillation   . Hyperlipidemia   . Weakness   . Dyspnea   . Headache   . Osteopenia   . COPD (chronic obstructive pulmonary disease)   . Allergic rhinitis     Past Surgical History  Procedure Laterality Date  . Catheter ablation    . Cataract extraction, bilateral    . Appendectomy    . Inguinal hernia repair    . Video assisted thoracoscopy (vats)/thorocotomy  08/17/2012    resection / stapling of blebs, mechanical pleurodesis   . Video assisted thoracoscopy  08/17/2012    Procedure: VIDEO ASSISTED THORACOSCOPY;  Surgeon: [name], MD;  Location: [facility] OR;  Service: Thoracic;  Laterality: Right;  . Resection of apical bleb  08/17/2012    Procedure: RESECTION OF APICAL BLEB;  Surgeon: [name], MD;  Location: [facility] OR;  Service: Thoracic;  Laterality: Right;  stapling of bleb  . Video bronchoscopy  08/27/2012    Procedure: VIDEO BRONCHOSCOPY;  Surgeon: [name], MD;  Location: [facility] OR;  Service: Thoracic;  Laterality: N/A;  evaluation of endobronchial valves    Family History  Problem Relation Age of Onset  . Heart attack Father   . COPD Brother     deceased  . COPD Sister   . COPD Father   . Lung cancer Father     Social History History  Substance Use Topics  . Smoking status: Former Smoker -- 3.00 packs/day    Types: Cigarettes  . Smokeless tobacco: Former Neurosurgeon    Quit date:  09/01/1965  . Alcohol Use: No    Current Outpatient Prescriptions  Medication Sig Dispense Refill  . albuterol (PROAIR HFA) 108 (90 BASE) MCG/ACT inhaler Inhale 2 puffs into the lungs every 4 (four) hours as needed. For shortness of breath/wheezing      . albuterol (PROVENTIL) (2.5 MG/3ML) 0.083% nebulizer solution Take 3 mLs (2.5 mg total) by nebulization every 6 (six) hours as needed for wheezing.  75 mL  12  . alendronate (FOSAMAX) 70 MG tablet Take 70 mg by mouth every 7 (seven) days. Takes on Tuesday Take with a full glass of water on an empty stomach.      Marland Kitchen alum & mag hydroxide-simeth (MAALOX/MYLANTA) 200-200-20 MG/5ML suspension Take 15 mLs by mouth every 6 (six) hours as needed.  780 mL  1  . aspirin EC 81 MG tablet Take 1 tablet (81 mg total) by mouth daily.  150 tablet  2  . budesonide-formoterol (SYMBICORT) 160-4.5 MCG/ACT inhaler Inhale 2 puffs into the lungs 2 (two) times daily. Rinse mouth  3 Inhaler  3  . Calcium-Magnesium-Vitamin D (ONE-A-DAY CALCIUM PLUS) 500-50-100 MG-MG-UNIT CHEW Chew 1 tablet by mouth 2 (two) times daily.        Marland Kitchen EPINEPHrine (EPI-PEN) 0.3 mg/0.3 mL DEVI Inject 0.3 mg into the skin daily as needed. For anaphylaxis      . guaiFENesin (  MUCINEX) 600 MG 12 hr tablet Take 1-2 mg by mouth every 12 (twelve) hours as needed. For cough      . ipratropium-albuterol (DUONEB) 0.5-2.5 (3) MG/3ML SOLN Take 3 mLs by nebulization every 6 (six) hours as needed (for shortness of breath or wheezing).       Marland Kitchen levofloxacin (LEVAQUIN) 750 MG tablet Take 1 tablet (750 mg total) by mouth daily.  10 tablet  0  . magnesium oxide (MAG-OX) 400 MG tablet Take 1 tablet (400 mg total) by mouth daily.      . Multiple Vitamins-Minerals (CENTRUM SILVER PO) Take 1 tablet by mouth daily.        Marland Kitchen omeprazole (PRILOSEC) 20 MG capsule Take 1 capsule (20 mg total) by mouth 2 (two) times daily.  60 capsule  1  . predniSONE (DELTASONE) 20 MG tablet Take 20 mg by mouth daily.      . propafenone  (RYTHMOL) 225 MG tablet Take 225 mg by mouth 2 (two) times daily.      . traMADol (ULTRAM) 50 MG tablet Take 50-100 mg by mouth every 6 (six) hours as needed for pain.      . verapamil (CALAN-SR) 240 MG CR tablet Take 1 tablet (240 mg total) by mouth at bedtime.  30 tablet  9  . vitamin B-12 (CYANOCOBALAMIN) 250 MCG tablet Take 250 mcg by mouth daily.       No current facility-administered medications for this visit.    Allergies  Allergen Reactions  . Shrimp (Shellfish Allergy)   . Zolpidem Tartrate     Review of Systems no fever baseline breathing continues on home oxygen  BP 129/81  Pulse 124  Resp 20  Ht 5\' 11"  (1.803 m)  Wt 169 lb (76.658 kg)  BMI 23.58 kg/m2  SpO2 92% Physical Exam Breath sounds clear equal Chest tube site mildly erythematous no purulence No airleak from chest tube Chest tube advanced 3-4 cm and then resutured, connected to mini expressed chamber  Diagnostic Tests:  Chest x-ray shows no pneumothorax, no subpulmonic space Impression: Sealed airleak, chest tube advanced  because of recent empyema  Plan: Continue with interval advancement chest tube until removed. Patient will finish a prolonged course of oral antibiotic but no evidence of recurrent empyema. Returned office in 10 days to 2 weeks

## 2012-11-13 NOTE — Assessment & Plan Note (Signed)
We discussed phenylephrine as a decongestant but cautioned about stimulant effect and his heart.

## 2012-11-13 NOTE — Assessment & Plan Note (Signed)
Being followed by T-sgy/ Dr Maren Beach. Tube continues to drain.

## 2012-11-13 NOTE — Assessment & Plan Note (Signed)
Chronic hypoxic respiratory failure. He continues oxygen 3 L

## 2012-11-16 DIAGNOSIS — J441 Chronic obstructive pulmonary disease with (acute) exacerbation: Secondary | ICD-10-CM

## 2012-12-06 ENCOUNTER — Other Ambulatory Visit: Payer: Self-pay | Admitting: *Deleted

## 2012-12-06 DIAGNOSIS — R918 Other nonspecific abnormal finding of lung field: Secondary | ICD-10-CM

## 2012-12-08 ENCOUNTER — Ambulatory Visit (INDEPENDENT_AMBULATORY_CARE_PROVIDER_SITE_OTHER): Payer: Medicare Other | Admitting: Cardiothoracic Surgery

## 2012-12-08 ENCOUNTER — Ambulatory Visit
Admission: RE | Admit: 2012-12-08 | Discharge: 2012-12-08 | Disposition: A | Discharge status: Home / Self Care | Payer: Medicare Other | Source: Ambulatory Visit | Attending: Cardiothoracic Surgery | Admitting: Cardiothoracic Surgery

## 2012-12-08 ENCOUNTER — Encounter: Payer: Self-pay | Admitting: Cardiothoracic Surgery

## 2012-12-08 VITALS — BP 124/66 | HR 103 | Resp 20 | Ht 71.0 in | Wt 169.0 lb

## 2012-12-08 DIAGNOSIS — J9383 Other pneumothorax: Secondary | ICD-10-CM

## 2012-12-08 DIAGNOSIS — R918 Other nonspecific abnormal finding of lung field: Secondary | ICD-10-CM

## 2012-12-08 DIAGNOSIS — J449 Chronic obstructive pulmonary disease, unspecified: Secondary | ICD-10-CM

## 2012-12-08 DIAGNOSIS — J9382 Other air leak: Secondary | ICD-10-CM

## 2012-12-08 DIAGNOSIS — J4489 Other specified chronic obstructive pulmonary disease: Secondary | ICD-10-CM

## 2012-12-08 DIAGNOSIS — J939 Pneumothorax, unspecified: Secondary | ICD-10-CM

## 2012-12-08 DIAGNOSIS — Z09 Encounter for follow-up examination after completed treatment for conditions other than malignant neoplasm: Secondary | ICD-10-CM

## 2012-12-08 NOTE — Progress Notes (Signed)
PCP is [name], MD Referring Provider is [name], *  Chief Complaint  Patient presents with  . Routine Post Op    2 week f/u with cxr    HPI: Followup 4 indwelling right pleural tube for prolonged airleak after VATS and bleb resection. Minimal drainage no air leak from chest tube over the past 2 weeks. Chest x-ray shows right chest tube almost out and only and the chest wall. Right lung is expanded with some volume loss of the right base. No fever no productive cough he is off antibiotics currently. Remains on home oxygen with marginal pulmonary status  Past Medical History  Diagnosis Date  . Atrial fibrillation   . Hyperlipidemia   . Weakness   . Dyspnea   . Headache   . Osteopenia   . COPD (chronic obstructive pulmonary disease)   . Allergic rhinitis     Past Surgical History  Procedure Laterality Date  . Catheter ablation    . Cataract extraction, bilateral    . Appendectomy    . Inguinal hernia repair    . Video assisted thoracoscopy (vats)/thorocotomy  08/17/2012    resection / stapling of blebs, mechanical pleurodesis   . Video assisted thoracoscopy  08/17/2012    Procedure: VIDEO ASSISTED THORACOSCOPY;  Surgeon: [name], MD;  Location: [facility] OR;  Service: Thoracic;  Laterality: Right;  . Resection of apical bleb  08/17/2012    Procedure: RESECTION OF APICAL BLEB;  Surgeon: [name], MD;  Location: [facility] OR;  Service: Thoracic;  Laterality: Right;  stapling of bleb  . Video bronchoscopy  08/27/2012    Procedure: VIDEO BRONCHOSCOPY;  Surgeon: [name], MD;  Location: [facility] OR;  Service: Thoracic;  Laterality: N/A;  evaluation of endobronchial valves    Family History  Problem Relation Age of Onset  . Heart attack Father   . COPD Brother     deceased  . COPD Sister   . COPD Father   . Lung cancer Father     Social History History  Substance Use Topics  . Smoking status: Former Smoker -- 3.00 packs/day    Types:  Cigarettes  . Smokeless tobacco: Former Neurosurgeon    Quit date: 09/01/1965  . Alcohol Use: No    Current Outpatient Prescriptions  Medication Sig Dispense Refill  . albuterol (PROAIR HFA) 108 (90 BASE) MCG/ACT inhaler Inhale 2 puffs into the lungs every 4 (four) hours as needed. For shortness of breath/wheezing      . albuterol (PROVENTIL) (2.5 MG/3ML) 0.083% nebulizer solution Take 3 mLs (2.5 mg total) by nebulization every 6 (six) hours as needed for wheezing.  75 mL  12  . alendronate (FOSAMAX) 70 MG tablet Take 70 mg by mouth every 7 (seven) days. Takes on Tuesday Take with a full glass of water on an empty stomach.      Marland Kitchen alum & mag hydroxide-simeth (MAALOX/MYLANTA) 200-200-20 MG/5ML suspension Take 15 mLs by mouth every 6 (six) hours as needed.  780 mL  1  . aspirin EC 81 MG tablet Take 1 tablet (81 mg total) by mouth daily.  150 tablet  2  . budesonide-formoterol (SYMBICORT) 160-4.5 MCG/ACT inhaler Inhale 2 puffs into the lungs 2 (two) times daily. Rinse mouth  3 Inhaler  3  . Calcium-Magnesium-Vitamin D (ONE-A-DAY CALCIUM PLUS) 500-50-100 MG-MG-UNIT CHEW Chew 1 tablet by mouth 2 (two) times daily.        Marland Kitchen EPINEPHrine (EPI-PEN) 0.3 mg/0.3 mL DEVI Inject 0.3  mg into the skin daily as needed. For anaphylaxis      . guaiFENesin (MUCINEX) 600 MG 12 hr tablet Take 1-2 mg by mouth every 12 (twelve) hours as needed. For cough      . ipratropium-albuterol (DUONEB) 0.5-2.5 (3) MG/3ML SOLN Take 3 mLs by nebulization every 6 (six) hours as needed (for shortness of breath or wheezing).       . magnesium oxide (MAG-OX) 400 MG tablet Take 1 tablet (400 mg total) by mouth daily.      . Multiple Vitamins-Minerals (CENTRUM SILVER PO) Take 1 tablet by mouth daily.        Marland Kitchen omeprazole (PRILOSEC) 20 MG capsule Take 1 capsule (20 mg total) by mouth 2 (two) times daily.  60 capsule  1  . predniSONE (DELTASONE) 20 MG tablet Take 20 mg by mouth daily.      . propafenone (RYTHMOL) 225 MG tablet Take 225 mg by mouth  2 (two) times daily.      . traMADol (ULTRAM) 50 MG tablet Take 50-100 mg by mouth every 6 (six) hours as needed for pain.      . verapamil (CALAN-SR) 240 MG CR tablet Take 1 tablet (240 mg total) by mouth at bedtime.  30 tablet  9  . vitamin B-12 (CYANOCOBALAMIN) 250 MCG tablet Take 250 mcg by mouth daily.       No current facility-administered medications for this visit.    Allergies  Allergen Reactions  . Shrimp (Shellfish Allergy)   . Zolpidem Tartrate     Review of Systems no fever or productive cough  BP 124/66  Pulse 103  Resp 20  Ht 5\' 11"  (1.803 m)  Wt 169 lb (76.658 kg)  BMI 23.58 kg/m2  SpO2 92% Physical Exam Breath sounds distant but equal Chest tube has been retracted in his only and the chest walls minimal drainage and no airleak Chest tube site clean after chest tube is removed and site packed with saline wet-to-dry  Diagnostic Tests: Chest x-ray shows no pneumothorax  Impression: Chest tube is removed. The chest tube site is superficial less than 2 cm deep. No evidence of air exiting the thorax. Plan: Will pack the chest tube site with wet-to-dry dressings for the next 5 days. Return for followup chest x-ray in 2 weeks.

## 2012-12-08 NOTE — Patient Instructions (Signed)
Packed chest tube site with 2 x 2 gauze for the next 5 days

## 2012-12-09 ENCOUNTER — Ambulatory Visit: Payer: Medicare Other | Admitting: Internal Medicine

## 2012-12-14 ENCOUNTER — Ambulatory Visit (INDEPENDENT_AMBULATORY_CARE_PROVIDER_SITE_OTHER): Payer: Medicare Other | Admitting: Internal Medicine

## 2012-12-14 ENCOUNTER — Encounter: Payer: Self-pay | Admitting: Internal Medicine

## 2012-12-14 VITALS — BP 111/63 | HR 100 | Ht 70.5 in | Wt 168.6 lb

## 2012-12-14 DIAGNOSIS — I4891 Unspecified atrial fibrillation: Secondary | ICD-10-CM

## 2012-12-14 DIAGNOSIS — R200 Anesthesia of skin: Secondary | ICD-10-CM | POA: Insufficient documentation

## 2012-12-14 DIAGNOSIS — R209 Unspecified disturbances of skin sensation: Secondary | ICD-10-CM

## 2012-12-14 NOTE — Assessment & Plan Note (Signed)
Holding sinus rhythm. We discussed the value of aspirin. I am not sure of the. We will decrease it from 7 days a week-3 days a week

## 2012-12-14 NOTE — Assessment & Plan Note (Signed)
The patient has recurring tingling in his right hand. It has gone on for months. Is not likely a thromboembolic event. He does have some weakness in something or wasting. I suspect that this is carpal tunnel. The event that the symptoms do not abate, he is recommended to follow up with his PCP

## 2012-12-14 NOTE — Patient Instructions (Addendum)
Your physician wants you to follow-up in: 1 year with Dr Klein.  You will receive a reminder letter in the mail two months in advance. If you don't receive a letter, please call our office to schedule the follow-up appointment.  

## 2012-12-14 NOTE — Progress Notes (Signed)
Patient Care Team: [name], MD as PCP - General (Family Medicine)   HPI  Mark [name] is a 72 y.o. male Seen in followup after a long hiatus for paroxysmal atrial fibrillation in the setting of severe COPD. He is taking propafenone in the past  Is a history of a bleb resection via VATS complicated by prolonged air leak. Chest tube was removed last week  Cardiac line he is relatively stable. He notes that his heart rate goes from the 90/100 range--130/140 range when he stands but this is no different from what it has been over once.  He also has complaints of tingling in his right hand. It is notable upon awakening it also occurs periodically during the day and has been going on for months. There are no other focal neurological signs. Past Medical History  Diagnosis Date  . Atrial fibrillation   . Hyperlipidemia   . Weakness   . Dyspnea   . Headache   . Osteopenia   . COPD (chronic obstructive pulmonary disease)   . Allergic rhinitis     Past Surgical History  Procedure Laterality Date  . Catheter ablation    . Cataract extraction, bilateral    . Appendectomy    . Inguinal hernia repair    . Video assisted thoracoscopy (vats)/thorocotomy  08/17/2012    resection / stapling of blebs, mechanical pleurodesis   . Video assisted thoracoscopy  08/17/2012    Procedure: VIDEO ASSISTED THORACOSCOPY;  Surgeon: [name], MD;  Location: [facility] OR;  Service: Thoracic;  Laterality: Right;  . Resection of apical bleb  08/17/2012    Procedure: RESECTION OF APICAL BLEB;  Surgeon: [name], MD;  Location: [facility] OR;  Service: Thoracic;  Laterality: Right;  stapling of bleb  . Video bronchoscopy  08/27/2012    Procedure: VIDEO BRONCHOSCOPY;  Surgeon: [name], MD;  Location: [facility] OR;  Service: Thoracic;  Laterality: N/A;  evaluation of endobronchial valves    Current Outpatient Prescriptions  Medication Sig Dispense Refill  . albuterol (PROVENTIL) (2.5 MG/3ML) 0.083%  nebulizer solution Take 3 mLs (2.5 mg total) by nebulization every 6 (six) hours as needed for wheezing.  75 mL  12  . alendronate (FOSAMAX) 70 MG tablet Take 70 mg by mouth every 7 (seven) days. Takes on Tuesday Take with a full glass of water on an empty stomach.      Marland Kitchen aspirin EC 81 MG tablet Take 1 tablet (81 mg total) by mouth daily.  150 tablet  2  . budesonide-formoterol (SYMBICORT) 160-4.5 MCG/ACT inhaler Inhale 2 puffs into the lungs 2 (two) times daily. Rinse mouth  3 Inhaler  3  . Calcium-Magnesium-Vitamin D (ONE-A-DAY CALCIUM PLUS) 500-50-100 MG-MG-UNIT CHEW Chew 1 tablet by mouth 2 (two) times daily.        Marland Kitchen EPINEPHrine (EPI-PEN) 0.3 mg/0.3 mL DEVI Inject 0.3 mg into the skin daily as needed. For anaphylaxis      . guaiFENesin (MUCINEX) 600 MG 12 hr tablet Take 1-2 mg by mouth every 12 (twelve) hours as needed. For cough      . ipratropium-albuterol (DUONEB) 0.5-2.5 (3) MG/3ML SOLN Take 3 mLs by nebulization every 6 (six) hours as needed (for shortness of breath or wheezing).       . magnesium oxide (MAG-OX) 400 MG tablet Take 1 tablet (400 mg total) by mouth daily.      . Multiple Vitamins-Minerals (CENTRUM SILVER PO) Take 1 tablet by mouth daily.        Marland Kitchen  omeprazole (PRILOSEC) 20 MG capsule Take 1 capsule (20 mg total) by mouth 2 (two) times daily.  60 capsule  1  . predniSONE (DELTASONE) 20 MG tablet Take 20 mg by mouth daily.      . propafenone (RYTHMOL) 225 MG tablet Take 225 mg by mouth 2 (two) times daily.      . traMADol (ULTRAM) 50 MG tablet Take 50-100 mg by mouth every 6 (six) hours as needed for pain.      . verapamil (CALAN-SR) 240 MG CR tablet Take 1 tablet (240 mg total) by mouth at bedtime.  30 tablet  9  . vitamin B-12 (CYANOCOBALAMIN) 250 MCG tablet Take 250 mcg by mouth daily.       No current facility-administered medications for this visit.    Allergies  Allergen Reactions  . Shrimp (Shellfish Allergy)   . Zolpidem Tartrate     Review of Systems negative  except from HPI and PMH  Physical Exam BP 111/63  Pulse 100  Ht 5' 10.5" (1.791 m)  Wt 168 lb 9.6 oz (76.476 kg)  BMI 23.84 kg/m2 Well developed and well nourished using oxygen HENT normal E scleral and icterus clear Neck Supple JVP <10 Decreased breath sounds occasional wheeze Rapid but Regular rate and rhythm, no murmurs gallops or rub Soft with active bowel sounds No clubbing cyanosis none Edema Alert and oriented,  There is some amount of weakness in his right hand is with him opposition as well as finger extension. There is some thenar wasting Skin Warm and Dry  ECG demonstrates sinus rhythm at 103 Intervals 13/09/34 Axis is 45 RSR prime Rare PAC  Assessment and  Plan

## 2012-12-20 ENCOUNTER — Other Ambulatory Visit: Payer: Self-pay | Admitting: *Deleted

## 2012-12-20 DIAGNOSIS — J9383 Other pneumothorax: Secondary | ICD-10-CM

## 2012-12-22 ENCOUNTER — Ambulatory Visit (INDEPENDENT_AMBULATORY_CARE_PROVIDER_SITE_OTHER): Payer: Medicare Other | Admitting: Cardiothoracic Surgery

## 2012-12-22 ENCOUNTER — Ambulatory Visit
Admission: RE | Admit: 2012-12-22 | Discharge: 2012-12-22 | Disposition: A | Discharge status: Home / Self Care | Payer: Medicare Other | Source: Ambulatory Visit | Attending: Cardiothoracic Surgery | Admitting: Cardiothoracic Surgery

## 2012-12-22 ENCOUNTER — Encounter: Payer: Self-pay | Admitting: Cardiothoracic Surgery

## 2012-12-22 VITALS — BP 124/66 | HR 115 | Resp 18 | Ht 71.0 in | Wt 169.0 lb

## 2012-12-22 DIAGNOSIS — IMO0002 Reserved for concepts with insufficient information to code with codable children: Secondary | ICD-10-CM

## 2012-12-22 DIAGNOSIS — J9383 Other pneumothorax: Secondary | ICD-10-CM

## 2012-12-22 DIAGNOSIS — J939 Pneumothorax, unspecified: Secondary | ICD-10-CM

## 2012-12-22 DIAGNOSIS — Z09 Encounter for follow-up examination after completed treatment for conditions other than malignant neoplasm: Secondary | ICD-10-CM

## 2012-12-22 DIAGNOSIS — J9382 Other air leak: Secondary | ICD-10-CM

## 2012-12-22 DIAGNOSIS — Z9889 Other specified postprocedural states: Secondary | ICD-10-CM

## 2012-12-22 NOTE — Progress Notes (Signed)
PCP is [name], MD Referring Provider is [name], *  Chief Complaint  Patient presents with  . Routine Post Op    with cxr after chest tube removal at last visit    HPI: 2 week followup to check right chest wound Prolonged chest tube drainage after bleb resection for spontaneous recurrent pneumothorax Chest tube site required dressing changes and oral antibiotics which have been now completed Chest tube site now has been healed completely Chest x-ray shows no pneumothorax with some fine loss of the right base following lung resection  patient's symptoms are optimally controlled at this time  Past Medical History  Diagnosis Date  . Atrial fibrillation   . Hyperlipidemia   . Weakness   . Dyspnea   . Headache   . Osteopenia   . COPD (chronic obstructive pulmonary disease)   . Allergic rhinitis     Past Surgical History  Procedure Laterality Date  . Catheter ablation    . Cataract extraction, bilateral    . Appendectomy    . Inguinal hernia repair    . Video assisted thoracoscopy (vats)/thorocotomy  08/17/2012    resection / stapling of blebs, mechanical pleurodesis   . Video assisted thoracoscopy  08/17/2012    Procedure: VIDEO ASSISTED THORACOSCOPY;  Surgeon: [name], MD;  Location: [facility] OR;  Service: Thoracic;  Laterality: Right;  . Resection of apical bleb  08/17/2012    Procedure: RESECTION OF APICAL BLEB;  Surgeon: [name], MD;  Location: [facility] OR;  Service: Thoracic;  Laterality: Right;  stapling of bleb  . Video bronchoscopy  08/27/2012    Procedure: VIDEO BRONCHOSCOPY;  Surgeon: [name], MD;  Location: [facility] OR;  Service: Thoracic;  Laterality: N/A;  evaluation of endobronchial valves    Family History  Problem Relation Age of Onset  . Heart attack Father   . COPD Brother     deceased  . COPD Sister   . COPD Father   . Lung cancer Father     Social History History  Substance Use Topics  . Smoking status: Former  Smoker -- 3.00 packs/day    Types: Cigarettes  . Smokeless tobacco: Former Neurosurgeon    Quit date: 09/01/1965  . Alcohol Use: No    Current Outpatient Prescriptions  Medication Sig Dispense Refill  . albuterol (PROVENTIL) (2.5 MG/3ML) 0.083% nebulizer solution Take 3 mLs (2.5 mg total) by nebulization every 6 (six) hours as needed for wheezing.  75 mL  12  . alendronate (FOSAMAX) 70 MG tablet Take 70 mg by mouth every 7 (seven) days. Takes on Tuesday Take with a full glass of water on an empty stomach.      Marland Kitchen aspirin EC 81 MG tablet Take 1 tablet (81 mg total) by mouth daily.  150 tablet  2  . budesonide-formoterol (SYMBICORT) 160-4.5 MCG/ACT inhaler Inhale 2 puffs into the lungs 2 (two) times daily. Rinse mouth  3 Inhaler  3  . Calcium-Magnesium-Vitamin D (ONE-A-DAY CALCIUM PLUS) 500-50-100 MG-MG-UNIT CHEW Chew 1 tablet by mouth 2 (two) times daily.        Marland Kitchen EPINEPHrine (EPI-PEN) 0.3 mg/0.3 mL DEVI Inject 0.3 mg into the skin daily as needed. For anaphylaxis      . guaiFENesin (MUCINEX) 600 MG 12 hr tablet Take 1-2 mg by mouth every 12 (twelve) hours as needed. For cough      . ipratropium-albuterol (DUONEB) 0.5-2.5 (3) MG/3ML SOLN Take 3 mLs by nebulization every 6 (six) hours as  needed (for shortness of breath or wheezing).       . magnesium oxide (MAG-OX) 400 MG tablet Take 1 tablet (400 mg total) by mouth daily.      . Multiple Vitamins-Minerals (CENTRUM SILVER PO) Take 1 tablet by mouth daily.        Marland Kitchen omeprazole (PRILOSEC) 20 MG capsule Take 1 capsule (20 mg total) by mouth 2 (two) times daily.  60 capsule  1  . predniSONE (DELTASONE) 20 MG tablet Take 20 mg by mouth daily.      . propafenone (RYTHMOL) 225 MG tablet Take 225 mg by mouth 2 (two) times daily.      . traMADol (ULTRAM) 50 MG tablet Take 50-100 mg by mouth every 6 (six) hours as needed for pain.      . verapamil (CALAN-SR) 240 MG CR tablet Take 1 tablet (240 mg total) by mouth at bedtime.  30 tablet  9  . vitamin B-12  (CYANOCOBALAMIN) 250 MCG tablet Take 250 mcg by mouth daily.       No current facility-administered medications for this visit.    Allergies  Allergen Reactions  . Shrimp (Shellfish Allergy)   . Zolpidem Tartrate     Review of Systems no fever no productive cough no chest pain no ankle edema   BP 124/66  Pulse 115  Resp 18  Ht 5\' 11"  (1.803 m)  Wt 169 lb (76.658 kg)  BMI 23.58 kg/m2  SpO2 92% Physical Exam  Patient slightly tachycardic but breathing comfortably All surgical incision is healed Breath sounds clear bilaterally All surgical incision is healed  Diagnostic Tests: Chest x-ray without pneumothorax  Impression: Healed following VATS and chest tube for prolonged airleak  Plan: Return as needed Chest tube site needs only routine skin care

## 2012-12-24 ENCOUNTER — Ambulatory Visit: Payer: Medicare Other | Admitting: Internal Medicine

## 2012-12-29 ENCOUNTER — Ambulatory Visit: Payer: Medicare Other | Admitting: Cardiothoracic Surgery

## 2013-01-01 ENCOUNTER — Other Ambulatory Visit (HOSPITAL_COMMUNITY): Payer: Self-pay | Admitting: Family Medicine

## 2013-01-04 ENCOUNTER — Other Ambulatory Visit: Payer: Self-pay | Admitting: Internal Medicine

## 2013-01-15 ENCOUNTER — Other Ambulatory Visit: Payer: Self-pay | Admitting: Internal Medicine

## 2013-02-09 ENCOUNTER — Encounter: Payer: Self-pay | Admitting: Internal Medicine

## 2013-02-09 ENCOUNTER — Ambulatory Visit (INDEPENDENT_AMBULATORY_CARE_PROVIDER_SITE_OTHER): Payer: Medicare Other | Admitting: Internal Medicine

## 2013-02-09 VITALS — BP 110/70 | HR 73 | Ht 71.0 in | Wt 172.3 lb

## 2013-02-09 DIAGNOSIS — J439 Emphysema, unspecified: Secondary | ICD-10-CM

## 2013-02-09 DIAGNOSIS — J438 Other emphysema: Secondary | ICD-10-CM

## 2013-02-09 MED ORDER — ALBUTEROL SULFATE HFA 108 (90 BASE) MCG/ACT IN AERS: 2.0000 | INHALATION_SPRAY | RESPIRATORY_TRACT | Status: DC | PRN

## 2013-02-09 MED ORDER — AZITHROMYCIN 250 MG PO TABS: ORAL_TABLET | ORAL | Status: DC

## 2013-02-09 MED ORDER — PREDNISONE 20 MG PO TABS: 20.0000 mg | ORAL_TABLET | Freq: Every day | ORAL | Status: DC

## 2013-02-09 NOTE — Patient Instructions (Addendum)
Refills sent for albuterol HFA rescue inhaler and for prednisone  Please call as needed  Script for Z pak antibiotic to hold

## 2013-02-09 NOTE — Progress Notes (Signed)
Patient ID: Mark [name], male    DOB: 11-09-1940, 72 y.o.   MRN: [id number]  HPI 01/17/11- 72 yo with respiratory failure/ COPD, chronic AFib. Wife here Last here 11/04/10- Note reviewed Got a cold 2 weeks ago. Initally better. In last week-10 days has had more malaise, cough esp in last 3 days. Last antibiotic 2-3 months ago.  Went back up to 20 mg daily prednisone. Was never able to wean off last year when we tried. He has been on steroids many years..   05/07/11-  50 yo M former 3 PPD smoker with respiratory failure/ COPD, chronic AFib. Wife here No major changes. Uses nebulizer 3-5x/ day, infrequent use of rescue inhaler.Stays on prednisone 20 mg daily. He couldn't function at 15 mg daily.No recent infection. Fought off a mild cold a few weeks ago. He used his standby doxy for that and feels it helped. Daily mucinex. Daily cough with some phlegm. For flu vax today. Oxygen continuous- 1.5 - 2 L/M.  In past few weeks has noted pain down from right hip to ankle- discussed as likely pinched nerve/ compression fx from steroids. Spends much of each day sitting.   11/05/11-70 yo M former 3 PPD smoker with respiratory failure/ COPD/ steroid dependent, chronic AFib. Wife here. PCP Dr [name] Had chest cold at [facility] but finally better in past month. Remains on prednisone 20 mg daily. He has been able to be much more active, feeling better. Up and around more in house on O2 2 L/M with less need for wheelchair and with no acute issues. Denies infection, blood, chest pain. Had injections for back pain.   05/07/12- 28 yo M former 3 PPD smoker with respiratory failure/ COPD/ steroid dependent, chronic AFib.   Wife here.  PCP Dr [name] Denies any SOB, wheezing, cough, or congestion for about the past 2 weeks Remains on oxygen 3 L/[employer].  They plan to fly to [facility] for a family wedding. I filled out the airplane form. They will be taking a portable concentrator. We discussed oxygen, altitude and  exertion. He remains steroid dependent 20 mg prednisone daily.  08/10/12- 39 yo M former 3 PPD smoker with respiratory failure/ COPD/ steroid dependent, chronic AFib.   Wife here.  PCP Dr [name] ACUTE VISIT: since Sunday having a hard time keep in O2 levels up even with O2; no energy and unable to do much activites. Came back in wheelchair and O2 was 86% on 3l/m cont. Had had a cold and started doxycycline yesterday. Denies fever,  chest pain, palpitation, little cough, no blood, no edema. Some pain across upper back and shoulders.  09/28/12- 24 yo M former 3 PPD smoker with respiratory failure/ COPD/ steroid dependent, chronic AFib.   Wife here.  Post Hospital: No more than usual SOB or wheezing since being in hospital prolonged stay 12/17-1/7 for prolonged R spontaneous PTX with chest tube.  Chest tube is still in with a drain, followed by Dr Donata Clay. Breathing is better. Antibiotics are finished. Continues oxygen 2 L. CT chest 08/26/12-  IMPRESSION:  1. Moderate right hydropneumothorax with right chest tube in  place, terminating in the anterior mediastinal region. Subcutaneous  emphysema along the right chest wall.  2. Postoperative changes in the right hemithorax with associated  soft tissue in the upper right hemithorax.  3. Centrilobular emphysema with possible superimposed airspace  disease in the right lower lobe. The latter is of unknown  chronicity and appears somewhat fibrotic.  Original Report Authenticated By: Leanna Battles, M.D. CXR 09/29/12 IMPRESSION:  1. Stable right-sided chest tube with persistent right-sided  pneumothorax.  2. Persistent right midlung density.  3. Stable severe underlying emphysematous changes.  Original Report Authenticated By: Rudie Meyer, M.D.  11/09/12- 59 yo M former 3 PPD smoker with respiratory failure/ COPD/ steroid dependent, chronic AFib.   Wife here.  FOLLOWS FOR: has had a hard time since last OV; was recently in hospital, admitted through  ED on 10/30/2012 when chest tube clotted. Fluid culture from tube was negative. He was treated for sepsis. Still feels weak/short of breath but pain is controlled. Little cough now and no fever or sweat CXR 11/03/12 IMPRESSION:  Stable position of the right chest tube and stable small right  pneumothorax.  Stable postsurgical changes in the right lung.  Original Report Authenticated By: Richarda Overlie, M.D.  6/181/4-  75 yo M former 3 PPD smoker with respiratory failure/ COPD/ steroid dependent, chronic AFib.   Wife here.  FOLLOWS FOR: continues to have SOB and gives out sooner Right chest tube has finally been removed and he says he is doing better O2 2-3 L/[employer]. Unable to wean prednisone below 20 mg daily CXR 12/22/12 IMPRESSION:  Interval removal of right chest tube. No evidence for residual  right-sided pneumothorax.  Postsurgical change in the right midlung with scarring noted at the  right base.  Original Report Authenticated By: Kennith Center, M.D.  Review of Systems- see HPI Constitutional:   No-   weight loss, night sweats, fevers, chills, fatigue, lassitude. HEENT:   No-  headaches, difficulty swallowing, tooth/dental problems, sore throat,       No-  sneezing, itching, ear ache, nasal congestion, post nasal drip,  CV:  No-   chest pain, orthopnea, PND, no recent swelling in lower extremities, no-anasarca, dizziness, palpitations Resp: + shortness of breath with exertion or at rest.              Little cough,  No-  coughing up of blood.              No-   change in color of mucus.  Slight wheezing.   Skin: No-   rash or lesions. GI:  No-   heartburn, indigestion, abdominal pain, nausea, vomiting, GU:  MS:  No-   joint pain or swelling.   Neuro- nothing unusual  Psych:  No- change in mood or affect. No depression or anxiety.  No memory loss.  Objective:   Physical Exam  General- Alert, Oriented, Affect-appropriate, Distress- none acute, wheelchair, O2   2.5 l/m . Skin-  rash-none, lesions- none, excoriation- none. Steroid fragility and echymoses Lymphadenopathy- none Head- atraumatic            Eyes- Gross vision intact, PERRLA, conjunctivae clear secretions            Ears- Hearing, canals normal            Nose- Clear, No-Septal dev, mucus, polyps, erosion, perforation             Throat- Mallampati II , mucosa clear , drainage- none, tonsils- atrophic Neck- flexible , trachea midline, no stridor , thyroid nl, carotid no bruit Chest - symmetrical excursion , unlabored           Heart/CV- almost regular to my exam (no pacemaker) , no murmur , no gallop  , no rub, nl s1 s2  JVD- none , edema- trace, stasis changes- none, varices- none           Lung- +Very distant bilaterally, w/ diffuse slow expiratory wheeze, +loose cough , dullness-none, rub-none,  Pursed lips. Bilateral breath sounds.           Chest wall-  Abd- Br/ Gen/ Rectal- Not done, not indicated Extrem- cyanosis- none, clubbing, none, atrophy- none, strength- nl Neuro- grossly intact to observation

## 2013-02-26 ENCOUNTER — Encounter: Payer: Self-pay | Admitting: Internal Medicine

## 2013-02-26 NOTE — Assessment & Plan Note (Signed)
Now at baseline with chronic hypoxic respiratory failure and chronic steroid dependence. We discussed his steroids again.

## 2013-03-03 ENCOUNTER — Other Ambulatory Visit: Payer: Self-pay | Admitting: Internal Medicine

## 2013-05-10 ENCOUNTER — Telehealth: Payer: Self-pay | Admitting: Internal Medicine

## 2013-05-10 MED ORDER — BUDESONIDE-FORMOTEROL FUMARATE 160-4.5 MCG/ACT IN AERO: 2.0000 | INHALATION_SPRAY | Freq: Two times a day (BID) | RESPIRATORY_TRACT | Status: DC

## 2013-05-10 NOTE — Telephone Encounter (Signed)
Spoke with pt and advised that rx for Symbicort was sent to [facility].

## 2013-05-25 ENCOUNTER — Other Ambulatory Visit: Payer: Self-pay | Admitting: Internal Medicine

## 2013-06-14 ENCOUNTER — Ambulatory Visit: Payer: Medicare Other | Admitting: Internal Medicine

## 2013-06-16 ENCOUNTER — Encounter: Payer: Self-pay | Admitting: Internal Medicine

## 2013-06-16 ENCOUNTER — Ambulatory Visit (INDEPENDENT_AMBULATORY_CARE_PROVIDER_SITE_OTHER): Payer: Medicare Other | Admitting: Internal Medicine

## 2013-06-16 VITALS — BP 138/76 | HR 117 | Ht 71.0 in | Wt 169.4 lb

## 2013-06-16 DIAGNOSIS — J9610 Chronic respiratory failure, unspecified whether with hypoxia or hypercapnia: Secondary | ICD-10-CM

## 2013-06-16 DIAGNOSIS — J449 Chronic obstructive pulmonary disease, unspecified: Secondary | ICD-10-CM

## 2013-06-16 DIAGNOSIS — Z23 Encounter for immunization: Secondary | ICD-10-CM

## 2013-06-16 MED ORDER — COMPRESSOR/NEBULIZER MISC: Status: AC

## 2013-06-16 MED ORDER — ALBUTEROL SULFATE (2.5 MG/3ML) 0.083% IN NEBU: 2.5000 mg | INHALATION_SOLUTION | RESPIRATORY_TRACT | Status: DC | PRN

## 2013-06-16 NOTE — Progress Notes (Signed)
Patient ID: Mark [name], male    DOB: 11-09-1940, 72 y.o.   MRN: [id number]  HPI 01/17/11- 72 yo with respiratory failure/ COPD, chronic AFib. Wife here Last here 11/04/10- Note reviewed Got a cold 2 weeks ago. Initally better. In last week-10 days has had more malaise, cough esp in last 3 days. Last antibiotic 2-3 months ago.  Went back up to 20 mg daily prednisone. Was never able to wean off last year when we tried. He has been on steroids many years..   05/07/11-  72 yo M former 3 PPD smoker with respiratory failure/ COPD, chronic AFib. Wife here No major changes. Uses nebulizer 3-5x/ day, infrequent use of rescue inhaler.Stays on prednisone 20 mg daily. He couldn't function at 15 mg daily.No recent infection. Fought off a mild cold a few weeks ago. He used his standby doxy for that and feels it helped. Daily mucinex. Daily cough with some phlegm. For flu vax today. Oxygen continuous- 1.5 - 2 L/M.  In past few weeks has noted pain down from right hip to ankle- discussed as likely pinched nerve/ compression fx from steroids. Spends much of each day sitting.   11/05/11-72 yo M former 3 PPD smoker with respiratory failure/ COPD/ steroid dependent, chronic AFib. Wife here. PCP Dr [name] Had chest cold at [facility] but finally better in past month. Remains on prednisone 20 mg daily. He has been able to be much more active, feeling better. Up and around more in house on O2 2 L/M with less need for wheelchair and with no acute issues. Denies infection, blood, chest pain. Had injections for back pain.   05/07/12- 72 yo M former 3 PPD smoker with respiratory failure/ COPD/ steroid dependent, chronic AFib.   Wife here.  PCP Dr [name] Denies any SOB, wheezing, cough, or congestion for about the past 2 weeks Remains on oxygen 3 L/[employer].  They plan to fly to [facility] for a family wedding. I filled out the airplane form. They will be taking a portable concentrator. We discussed oxygen, altitude and  exertion. He remains steroid dependent 20 mg prednisone daily.  08/10/12- 72 yo M former 3 PPD smoker with respiratory failure/ COPD/ steroid dependent, chronic AFib.   Wife here.  PCP Dr [name] ACUTE VISIT: since Sunday having a hard time keep in O2 levels up even with O2; no energy and unable to do much activites. Came back in wheelchair and O2 was 86% on 3l/m cont. Had had a cold and started doxycycline yesterday. Denies fever,  chest pain, palpitation, little cough, no blood, no edema. Some pain across upper back and shoulders.  09/28/12- 72 yo M former 3 PPD smoker with respiratory failure/ COPD/ steroid dependent, chronic AFib.   Wife here.  Post Hospital: No more than usual SOB or wheezing since being in hospital prolonged stay 12/17-1/7 for prolonged R spontaneous PTX with chest tube.  Chest tube is still in with a drain, followed by Dr Donata Clay. Breathing is better. Antibiotics are finished. Continues oxygen 2 L. CT chest 08/26/12-  IMPRESSION:  1. Moderate right hydropneumothorax with right chest tube in  place, terminating in the anterior mediastinal region. Subcutaneous  emphysema along the right chest wall.  2. Postoperative changes in the right hemithorax with associated  soft tissue in the upper right hemithorax.  3. Centrilobular emphysema with possible superimposed airspace  disease in the right lower lobe. The latter is of unknown  chronicity and appears somewhat fibrotic.  Original Report Authenticated By: Leanna Battles, M.D. CXR 09/29/12 IMPRESSION:  1. Stable right-sided chest tube with persistent right-sided  pneumothorax.  2. Persistent right midlung density.  3. Stable severe underlying emphysematous changes.  Original Report Authenticated By: Rudie Meyer, M.D.  11/09/12- 74 yo M former 3 PPD smoker with respiratory failure/ COPD/ steroid dependent, chronic AFib.   Wife here.  FOLLOWS FOR: has had a hard time since last OV; was recently in hospital, admitted through  ED on 10/30/2012 when chest tube clotted. Fluid culture from tube was negative. He was treated for sepsis. Still feels weak/short of breath but pain is controlled. Little cough now and no fever or sweat CXR 11/03/12 IMPRESSION:  Stable position of the right chest tube and stable small right  pneumothorax.  Stable postsurgical changes in the right lung.  Original Report Authenticated By: Richarda Overlie, M.D.  6/181/4-  72 yo M former 3 PPD smoker with respiratory failure/ COPD/ steroid dependent, chronic AFib.   Wife here.  FOLLOWS FOR: continues to have SOB and gives out sooner Right chest tube has finally been removed and he says he is doing better O2 2-3 L/[employer]. Unable to wean prednisone below 20 mg daily CXR 12/22/12 IMPRESSION:  Interval removal of right chest tube. No evidence for residual  right-sided pneumothorax.  Postsurgical change in the right midlung with scarring noted at the  right base.  Original Report Authenticated By: Kennith Center, M.D.  06/16/13-  72 yo M former 3 PPD smoker with respiratory failure/ COPD/ steroid dependent, chronic AFib.   Wife here.  follows for-4 month rtn.  Pt c/o SOB constantly, yellow mucous brought out by clearing throat.  Some sinus congestion. Needs nebulizer machine/[employer]. Continues prednisone 20 mg daily long-term. Oxygen 2 L continuous. Had flu shot  Review of Systems- see HPI Constitutional:   No-   weight loss, night sweats, fevers, chills, fatigue, lassitude. HEENT:   No-  headaches, difficulty swallowing, tooth/dental problems, sore throat,       No-  sneezing, itching, ear ache, nasal congestion, post nasal drip,  CV:  No-   chest pain, orthopnea, PND, no recent swelling in lower extremities, no-anasarca, dizziness,                    +palpitations Resp: + shortness of breath with exertion or at rest.              Little cough,  No-  coughing up of blood.              No-   change in color of mucus.  Slight wheezing.   Skin: No-   rash or  lesions. GI:  No-   heartburn, indigestion, abdominal pain, nausea, vomiting, GU:  MS:  No-   joint pain or swelling.   Neuro- nothing unusual  Psych:  No- change in mood or affect. No depression or anxiety.  No memory loss.  Objective:   Physical Exam  General- Alert, Oriented, Affect-appropriate, Distress- none acute, walker, O2   2.5 l/m . Skin- rash-none, lesions- none, excoriation- none. Steroid fragility and echymoses Lymphadenopathy- none Head- atraumatic            Eyes- Gross vision intact, PERRLA, conjunctivae clear secretions            Ears- Hearing, canals normal            Nose- Clear, No-Septal dev, mucus, polyps, erosion, perforation  Throat- Mallampati II , mucosa clear , drainage- none, tonsils- atrophic Neck- flexible , trachea midline, no stridor , thyroid nl, carotid no bruit Chest - symmetrical excursion , unlabored           Heart/CV-+ IRR/ chronic AFib (no pacemaker) , no murmur , no gallop  , no rub, nl s1 s2                            JVD- none , edema- trace, stasis changes- none, varices- none           Lung- +Very distant bilaterally, w/ diffuse slow expiratory wheeze, +loose cough , dullness-none, rub-none,  +Pursed lips. Bilateral breath sounds.           Chest wall-  Abd- Br/ Gen/ Rectal- Not done, not indicated Extrem- cyanosis- none, clubbing, none, atrophy- none, strength- nl Neuro- grossly intact to observation

## 2013-06-16 NOTE — Patient Instructions (Addendum)
Pneumonia vaccine conjugate Prevnar-13  We can continue O2 2-3L/Min/[facility]  Order- DME [facility] - replacement nebulizer compressor machine     Dx COPD, chronic respiratory failure                                 Change nebulizer medication to albuterol- script printed

## 2013-07-02 NOTE — Assessment & Plan Note (Signed)
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

## 2013-07-04 ENCOUNTER — Telehealth: Payer: Self-pay | Admitting: Internal Medicine

## 2013-07-04 MED ORDER — AZITHROMYCIN 250 MG PO TABS: ORAL_TABLET | ORAL | Status: DC

## 2013-07-04 NOTE — Telephone Encounter (Signed)
Ok to call in Z pak to hold

## 2013-07-04 NOTE — Telephone Encounter (Signed)
Pt's wife is aware that we will be sending this rx. Nothing further was needed.

## 2013-07-04 NOTE — Telephone Encounter (Signed)
I spoke with pt. He the reports the coughing has subsided, no more wheezing nor chest tx. He is asking for ZPAK to be called in to have on hand if needed. Please advise [name] thanks  Allergies  Allergen Reactions  . Shrimp [Shellfish Allergy]   . Zolpidem Tartrate

## 2013-07-04 NOTE — Telephone Encounter (Signed)
Copied from Questionnaire:  Mark [name] - Questionnaire Submission ','[facility] Detail >>          Questionnaire Submission  Edmon Magid  MRN: [id number] DOB: February 02, 1941     Pt Home: [phone]     Sent: Mon July 04, 2013 9:34 AM   Entered: [phone]                           Current View: Showing all answers Show Only Relevant Answers    Legend: Scores, Non-relevant Questions     Patient Responses     Chl Mychart After Visit Questionnaire    Question 07/04/2013 9:34 AM   How are you feeling after your recent visit?    Does the recommended course of treatment seem to be helping your symptoms? about the same since discontinuing Ipratropium bromide nebulizer   Are you experiencing any side effects from your recommended treatment? no   is there anything else you would like to ask your physician? I have had a cold with coughing and some chest congestion. I have finished the z-pack and feeling better. I need a refill of the anitbiotic to have available.         Message    Patient Questionnaire Submission   --------------------------------      Questionnaire: Questionnaire      Question: How are you feeling after your recent visit?   Answer:       Question: Does the recommended course of treatment seem to be helping your symptoms?   Answer: about the same since discontinuing Ipratropium bromide nebulizer      Question: Are you experiencing any side effects from your recommended treatment?   Answer: no      Question: is there anything else you would like to ask your physician?   Answer: I have had a cold with coughing and some chest congestion. I have finished the z-pack and feeling better. I need a refill of the anitbiotic to have available.      ----- Message -----   From: Waymon Budge, MD   Sent: 07/02/2013 9:06 PM   To: [place] [facility]   Subject: Questionnaire       To ensure we are providing you the highest quality healthcare, we'd  like to know how you are feeling after your recent visit. At your earliest convenience, please complete the brief follow-up assessment by clicking the Task: Questionnaire link listed above.      Thank you for your time in helping [place] improve our services and for partnering with [place] in your wellness and care.      Sincerely,      Your Care Team

## 2013-07-27 IMAGING — CR DG CHEST 2V
2 series · 2 of 2 positions shown · non-contrast
Comparison: 09/08/2012

CLINICAL DATA: Cough, right chest tube.

CHEST - 2 VIEW

[w chest lat]
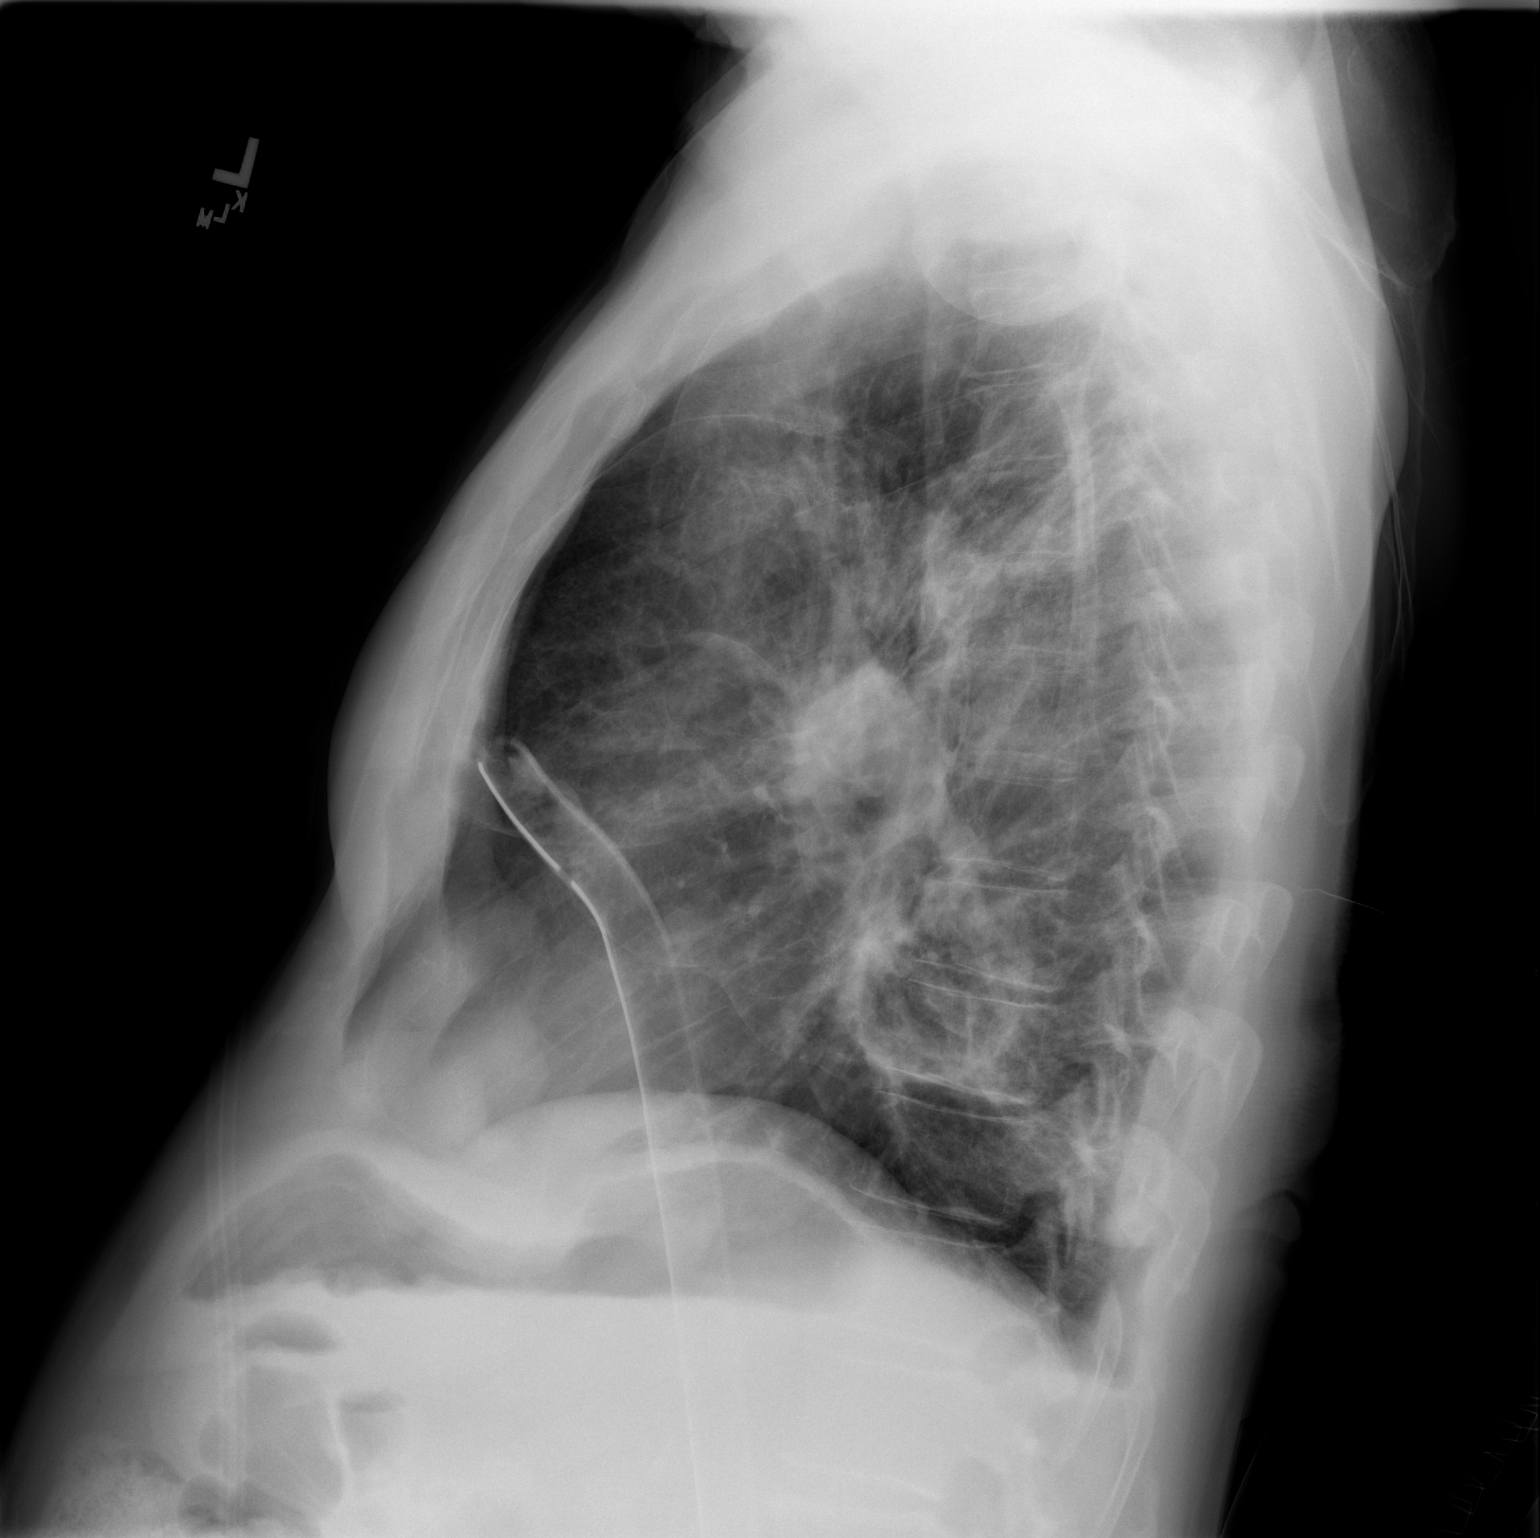

[w chest ap]
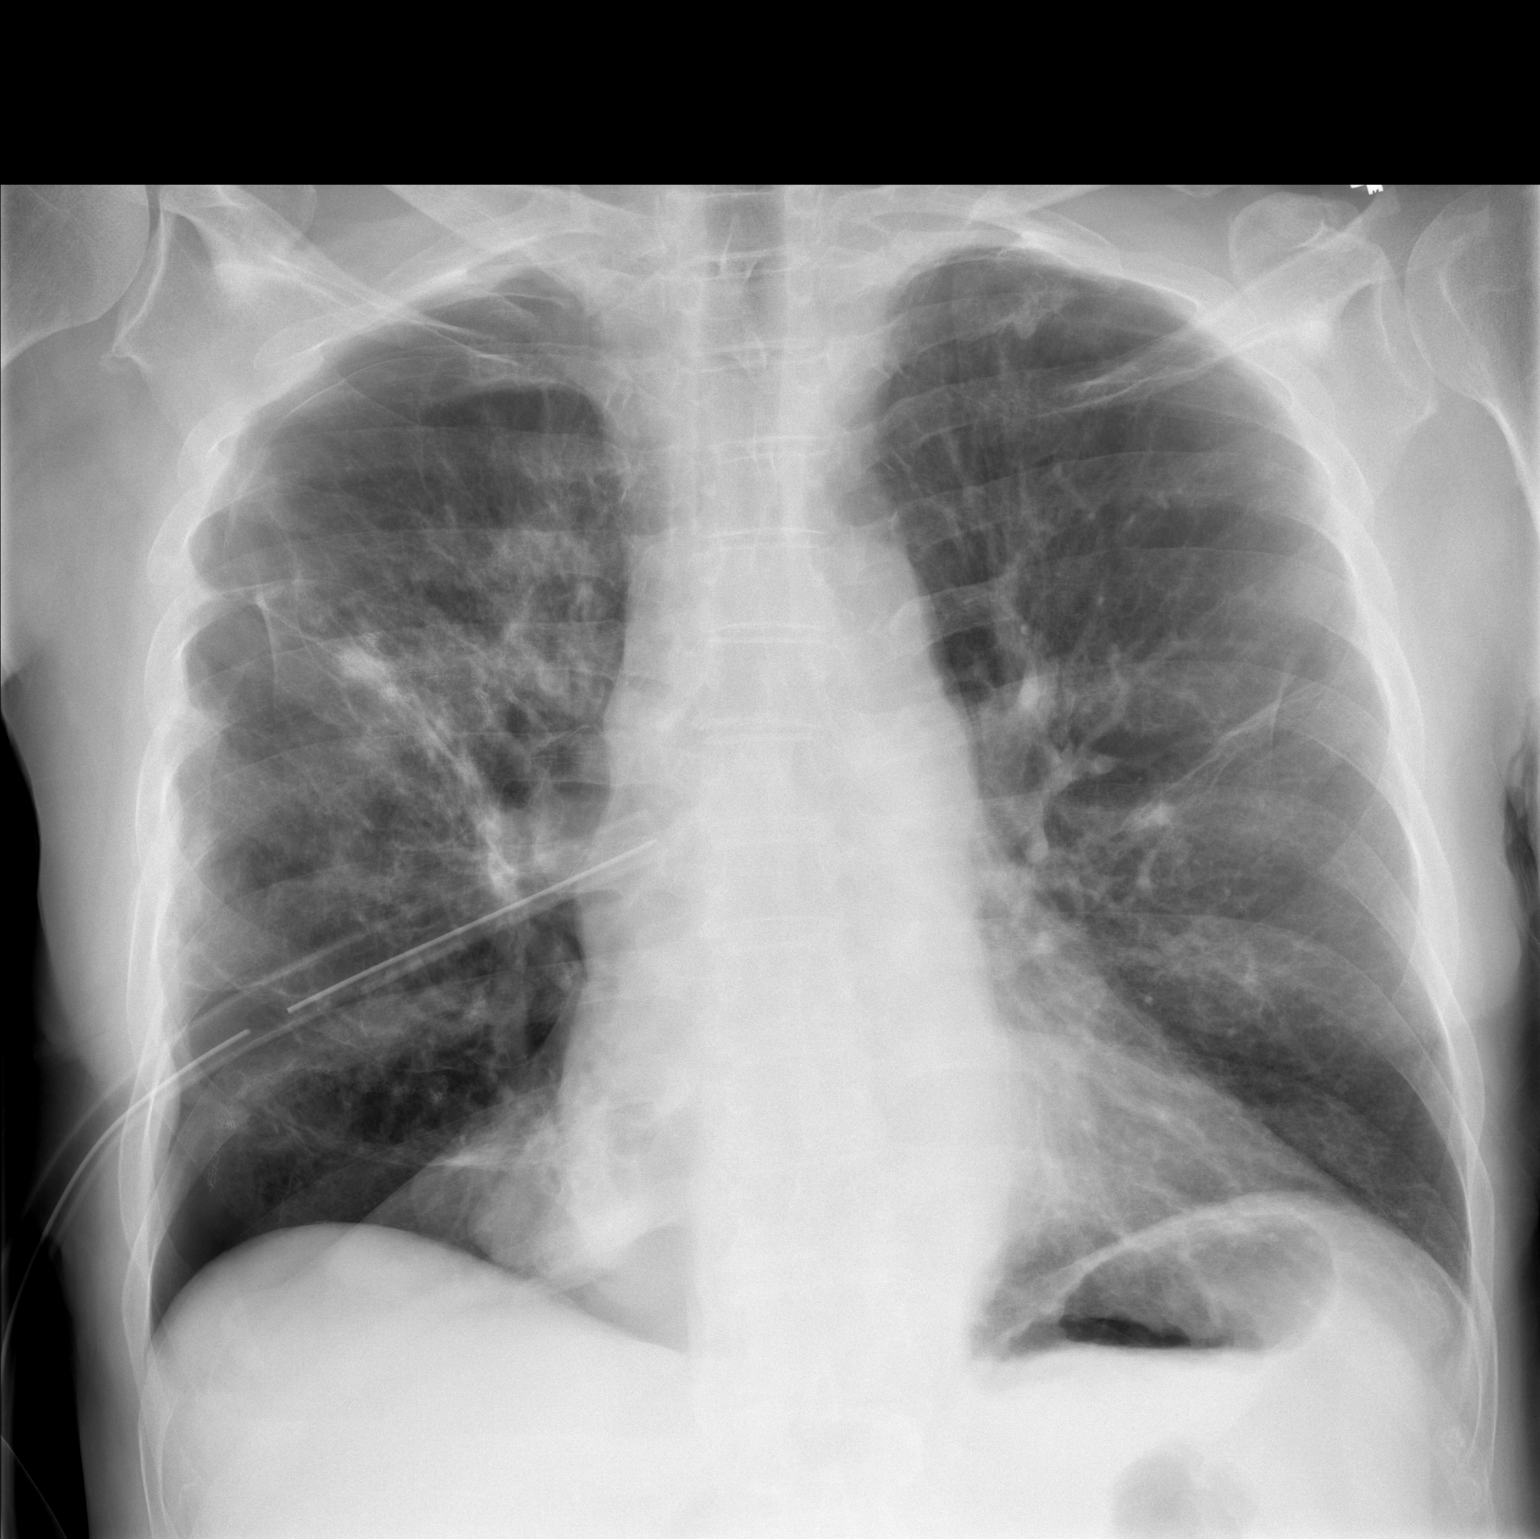

[2 of 2 positions shown; findings below may reference images not displayed]

FINDINGS: The right chest tube remains in stable position.
Postoperative changes on the right.  Moderate right pneumothorax
now noted.  The basilar component has decreased, but there is now a
new apical component.  Overall volume is likely similar.

Chronic changes in the lungs.  Hyperinflation/COPD.  Heart is
mildly enlarged.  No effusions.
IMPRESSION: Moderate sized right pneumothorax, likely not significantly changed
overall with decreasing basilar component but new apical component.

## 2013-08-03 IMAGING — CR DG CHEST 2V
2 series · 2 of 2 positions shown · non-contrast
Comparison: 09/08/2012 and 09/15/2012.

CLINICAL DATA: Shortness of breath.  Follow-up pneumothorax.

CHEST - 2 VIEW

[w chest lat]
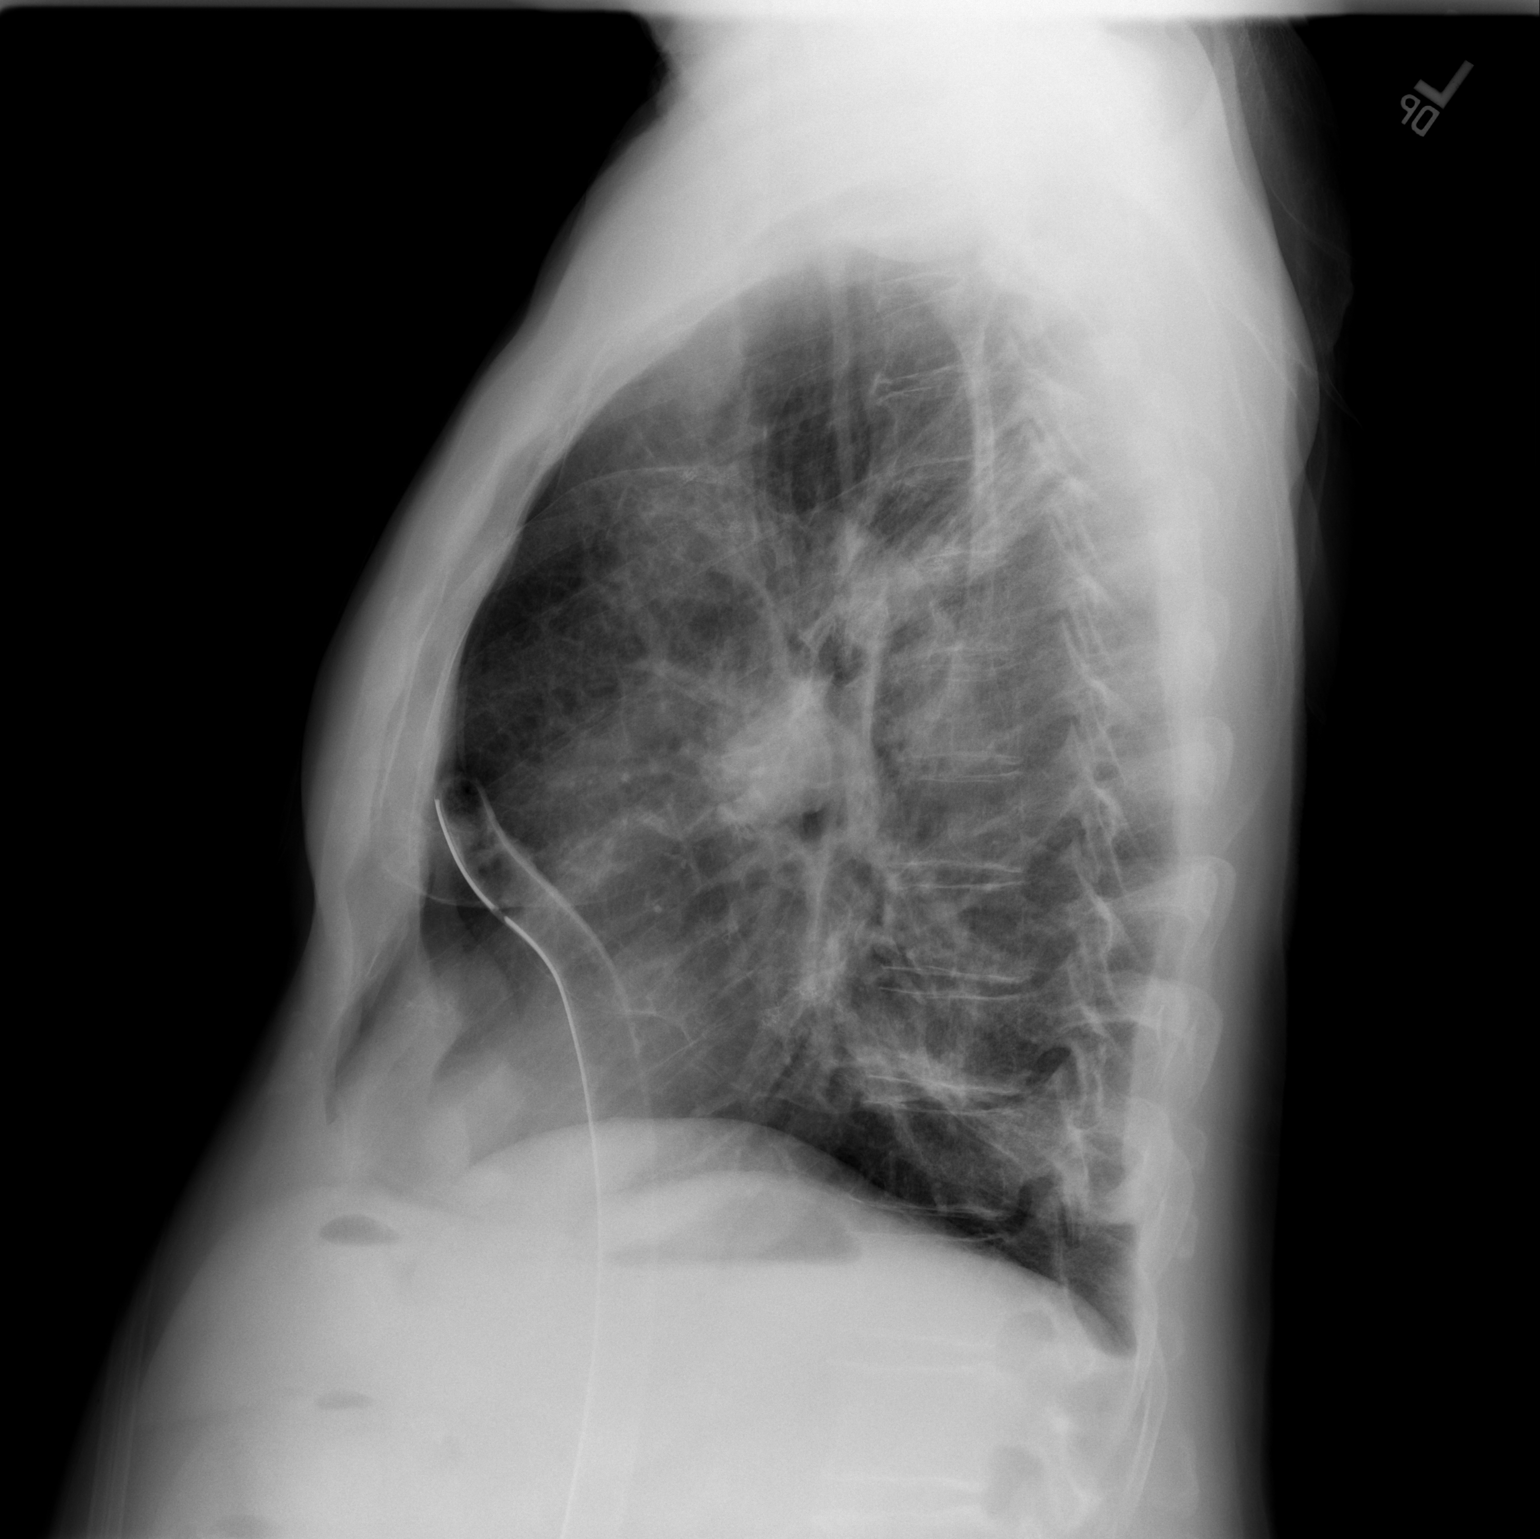

[w chest ap]
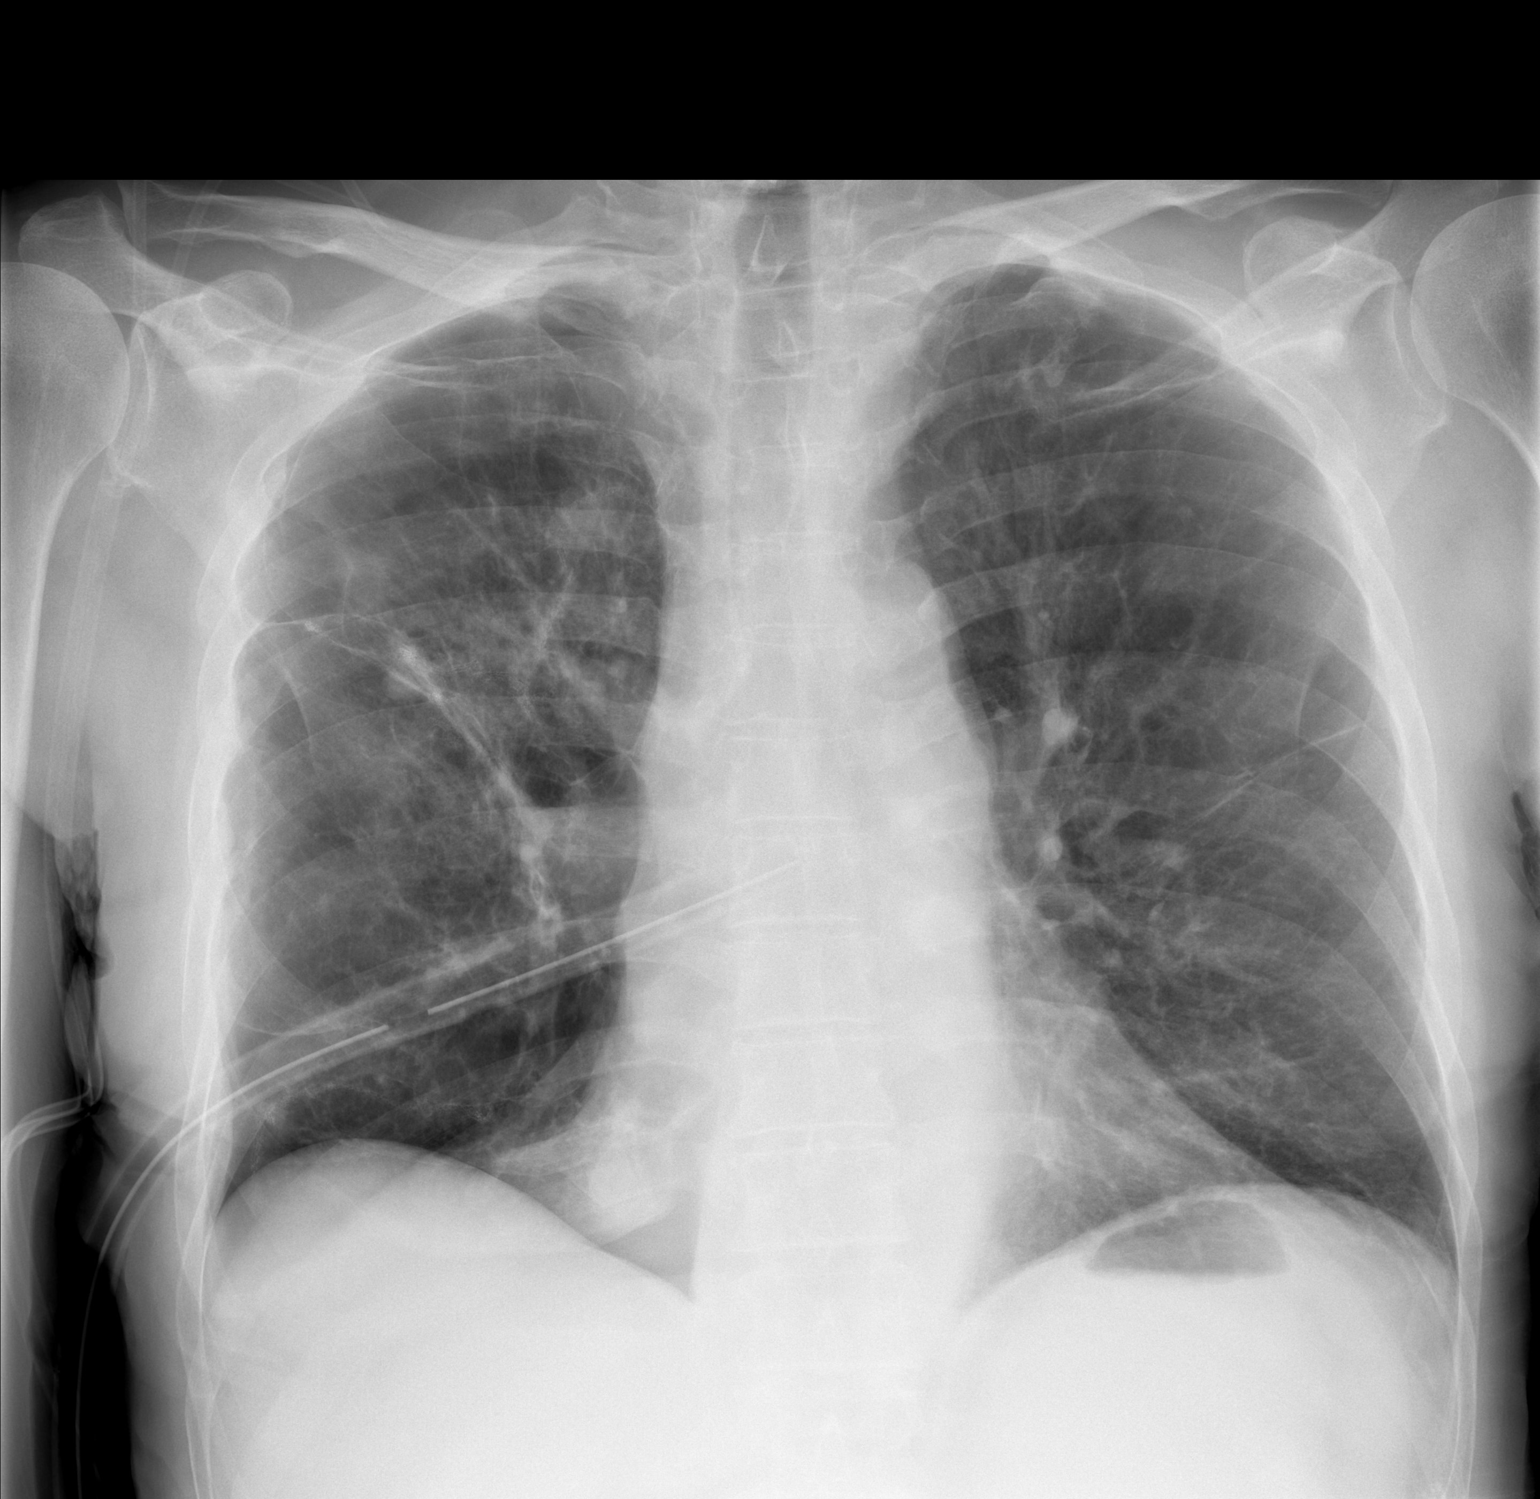

[2 of 2 positions shown; findings below may reference images not displayed]

FINDINGS: Right chest tube remains in place.  Right pneumothorax
has slightly improved with a small apical component remaining.
There is no significant basilar component.  Underlying perihilar
airspace disease on the right has mildly improved.  The left lung
appears unchanged.  Heart size and mediastinal contours are stable.
There is no significant pleural effusion.
IMPRESSION: Slight improvement in right apical pneumothorax and right perihilar
airspace disease.

## 2013-08-11 ENCOUNTER — Ambulatory Visit (INDEPENDENT_AMBULATORY_CARE_PROVIDER_SITE_OTHER)
Admission: RE | Admit: 2013-08-11 | Discharge: 2013-08-11 | Disposition: A | Discharge status: Home / Self Care | Payer: Medicare Other | Source: Ambulatory Visit | Attending: Internal Medicine | Admitting: Internal Medicine

## 2013-08-11 ENCOUNTER — Ambulatory Visit (INDEPENDENT_AMBULATORY_CARE_PROVIDER_SITE_OTHER): Payer: Medicare Other | Admitting: Internal Medicine

## 2013-08-11 ENCOUNTER — Encounter: Payer: Self-pay | Admitting: Internal Medicine

## 2013-08-11 VITALS — BP 140/72 | HR 125 | Temp 98.2°F | Ht 71.0 in | Wt 169.6 lb

## 2013-08-11 DIAGNOSIS — J449 Chronic obstructive pulmonary disease, unspecified: Secondary | ICD-10-CM

## 2013-08-11 MED ORDER — AMOXICILLIN-POT CLAVULANATE 875-125 MG PO TABS: 1.0000 | ORAL_TABLET | Freq: Two times a day (BID) | ORAL | Status: DC

## 2013-08-11 MED ORDER — METHYLPREDNISOLONE ACETATE 80 MG/ML IJ SUSP
80.0000 mg | Freq: Once | INTRAMUSCULAR | Status: AC
  Administered 2013-08-11: 80 mg via INTRAMUSCULAR

## 2013-08-11 NOTE — Patient Instructions (Signed)
Finish the Z pak antibiotic, then start script for augmentin (sent)   Order CXR   Dx acute exacerbation of COPD  Depo 80  Prednisone- use your 20 mg pills     2 daily (40 mg) x 3 days, 1 and a half daily (30 mg) x 3 days, then back to your usual 20 mg daily

## 2013-08-11 NOTE — Progress Notes (Signed)
Patient ID: Mark [name], male    DOB: 11-09-1940, 72 y.o.   MRN: [id number]  HPI 01/17/11- 72 yo with respiratory failure/ COPD, chronic AFib. Wife here Last here 11/04/10- Note reviewed Got a cold 2 weeks ago. Initally better. In last week-10 days has had more malaise, cough esp in last 3 days. Last antibiotic 2-3 months ago.  Went back up to 20 mg daily prednisone. Was never able to wean off last year when we tried. He has been on steroids many years..   05/07/11-  50 yo M former 3 PPD smoker with respiratory failure/ COPD, chronic AFib. Wife here No major changes. Uses nebulizer 3-5x/ day, infrequent use of rescue inhaler.Stays on prednisone 20 mg daily. He couldn't function at 15 mg daily.No recent infection. Fought off a mild cold a few weeks ago. He used his standby doxy for that and feels it helped. Daily mucinex. Daily cough with some phlegm. For flu vax today. Oxygen continuous- 1.5 - 2 L/M.  In past few weeks has noted pain down from right hip to ankle- discussed as likely pinched nerve/ compression fx from steroids. Spends much of each day sitting.   11/05/11-70 yo M former 3 PPD smoker with respiratory failure/ COPD/ steroid dependent, chronic AFib. Wife here. PCP Dr [name] Had chest cold at [facility] but finally better in past month. Remains on prednisone 20 mg daily. He has been able to be much more active, feeling better. Up and around more in house on O2 2 L/M with less need for wheelchair and with no acute issues. Denies infection, blood, chest pain. Had injections for back pain.   05/07/12- 28 yo M former 3 PPD smoker with respiratory failure/ COPD/ steroid dependent, chronic AFib.   Wife here.  PCP Dr [name] Denies any SOB, wheezing, cough, or congestion for about the past 2 weeks Remains on oxygen 3 L/[facility].  They plan to fly to [facility] for a family wedding. I filled out the airplane form. They will be taking a portable concentrator. We discussed oxygen, altitude and  exertion. He remains steroid dependent 20 mg prednisone daily.  08/10/12- 39 yo M former 3 PPD smoker with respiratory failure/ COPD/ steroid dependent, chronic AFib.   Wife here.  PCP Dr [name] ACUTE VISIT: since Sunday having a hard time keep in O2 levels up even with O2; no energy and unable to do much activites. Came back in wheelchair and O2 was 86% on 3l/m cont. Had had a cold and started doxycycline yesterday. Denies fever,  chest pain, palpitation, little cough, no blood, no edema. Some pain across upper back and shoulders.  09/28/12- 24 yo M former 3 PPD smoker with respiratory failure/ COPD/ steroid dependent, chronic AFib.   Wife here.  Post Hospital: No more than usual SOB or wheezing since being in hospital prolonged stay 12/17-1/7 for prolonged R spontaneous PTX with chest tube.  Chest tube is still in with a drain, followed by Dr Donata Clay. Breathing is better. Antibiotics are finished. Continues oxygen 2 L. CT chest 08/26/12-  IMPRESSION:  1. Moderate right hydropneumothorax with right chest tube in  place, terminating in the anterior mediastinal region. Subcutaneous  emphysema along the right chest wall.  2. Postoperative changes in the right hemithorax with associated  soft tissue in the upper right hemithorax.  3. Centrilobular emphysema with possible superimposed airspace  disease in the right lower lobe. The latter is of unknown  chronicity and appears somewhat fibrotic.  Original Report Authenticated By: Leanna Battles, M.D. CXR 09/29/12 IMPRESSION:  1. Stable right-sided chest tube with persistent right-sided  pneumothorax.  2. Persistent right midlung density.  3. Stable severe underlying emphysematous changes.  Original Report Authenticated By: Rudie Meyer, M.D.  11/09/12- 75 yo M former 3 PPD smoker with respiratory failure/ COPD/ steroid dependent, chronic AFib.   Wife here.  FOLLOWS FOR: has had a hard time since last OV; was recently in hospital, admitted through  ED on 10/30/2012 when chest tube clotted. Fluid culture from tube was negative. He was treated for sepsis. Still feels weak/short of breath but pain is controlled. Little cough now and no fever or sweat CXR 11/03/12 IMPRESSION:  Stable position of the right chest tube and stable small right  pneumothorax.  Stable postsurgical changes in the right lung.  Original Report Authenticated By: Richarda Overlie, M.D.  6/181/4-  16 yo M former 3 PPD smoker with respiratory failure/ COPD/ steroid dependent, chronic AFib.   Wife here.  FOLLOWS FOR: continues to have SOB and gives out sooner Right chest tube has finally been removed and he says he is doing better O2 2-3 L/[facility]. Unable to wean prednisone below 20 mg daily CXR 12/22/12 IMPRESSION:  Interval removal of right chest tube. No evidence for residual  right-sided pneumothorax.  Postsurgical change in the right midlung with scarring noted at the  right base.  Original Report Authenticated By: Kennith Center, M.D.  06/16/13-  16 yo M former 3 PPD smoker with respiratory failure/ COPD/ steroid dependent, chronic AFib.   Wife here.  follows for-4 month rtn.  Pt c/o SOB constantly, yellow mucous brought out by clearing throat.  Some sinus congestion. Needs nebulizer machine/[facility]. Continues prednisone 20 mg daily long-term. Oxygen 2 L continuous. Had flu shot  08/11/13-72 yo M former 3 PPD smoker with respiratory failure/ COPD/ steroid dependent, chronic AFib.   Wife here.  Pt c/o increased SOB, fatigue, chest pain, productive cough with yellow phlegm  x 4 days.  Pt is currently taking zpak on day 3. Had caught a cold one week ago. Feels weak, coughing yellow/white. Nebulizer machine is broken. Using his home oxygen flow pressure to drive his nebulizer medication, used for 5 times daily. Chronic prednisone 20 mg daily. He understands issues of osteoporosis, immunosuppression, adrenal insufficiency. CXR 08/11/13-  IMPRESSION:  No acute cardiopulmonary  disease. No change from the prior study.  Review of Systems- see HPI Constitutional:   No-   weight loss, night sweats, fevers, chills, fatigue, lassitude. HEENT:   No-  headaches, difficulty swallowing, tooth/dental problems, sore throat,       No-  sneezing, itching, ear ache, nasal congestion, post nasal drip,  CV:  No-   chest pain, orthopnea, PND, no recent swelling in lower extremities, no-anasarca, dizziness,                                   +palpitations Resp: + shortness of breath with exertion or at rest.              +cough,  No-  coughing up of blood.              No-   change in color of mucus.  Slight wheezing.   Skin: No-   rash or lesions. GI:  No-   heartburn, indigestion, abdominal pain, nausea, vomiting, GU:  MS:  No-   joint pain or swelling.  Neuro- nothing unusual  Psych:  No- change in mood or affect. No depression or anxiety.  No memory loss.  Objective:   Physical Exam  General- Alert, Oriented, Affect-appropriate, Distress- none acute, walker, O2   2. l/m . Skin- rash-none, lesions- none, excoriation- none. Steroid fragility and echymoses Lymphadenopathy- none Head- atraumatic            Eyes- Gross vision intact, PERRLA, conjunctivae clear secretions            Ears- Hearing, canals normal            Nose- Clear, No-Septal dev, mucus, polyps, erosion, perforation             Throat- Mallampati II , mucosa clear , drainage- none, tonsils- atrophic Neck- flexible , trachea midline, no stridor , thyroid nl, carotid no bruit Chest - symmetrical excursion , unlabored           Heart/CV-+ IRR/ chronic AFib (no pacemaker) , no murmur , no gallop  , no rub, nl s1 s2                            JVD- none , edema- trace, stasis changes- none, varices- none           Lung- +Very distant bilaterally, w/ diffuse slow expiratory wheeze, +rattling cough , dullness-none, rub-none,  +Pursed lips. Bilateral breath sounds.           Chest wall-  Abd- Br/ Gen/ Rectal- Not  done, not indicated Extrem- +power wheelchair Neuro- grossly intact to observation

## 2013-08-12 ENCOUNTER — Telehealth: Payer: Self-pay | Admitting: Internal Medicine

## 2013-08-12 ENCOUNTER — Other Ambulatory Visit: Payer: Self-pay | Admitting: Internal Medicine

## 2013-08-12 MED ORDER — PREDNISONE 20 MG PO TABS: 20.0000 mg | ORAL_TABLET | Freq: Every day | ORAL | Status: DC

## 2013-08-12 NOTE — Telephone Encounter (Signed)
Pt aware RX has been sent. Nothing further needed 

## 2013-08-26 ENCOUNTER — Telehealth: Payer: Self-pay | Admitting: Internal Medicine

## 2013-08-26 MED ORDER — CEFDINIR 300 MG PO CAPS: 300.0000 mg | ORAL_CAPSULE | Freq: Two times a day (BID) | ORAL | Status: DC

## 2013-08-26 NOTE — Telephone Encounter (Signed)
Called and spoke with pt and he stated that he has been having a cough with  Yellow sputum x 2 wks.  Having headaches and nasal congestion.  Low grade fever.  CY please advise of further recs for the pt.    Last seen on 08/11/2013 for fever and cough Next ov--10/18/2013  Allergies  Allergen Reactions  . Shrimp [Shellfish Allergy]   . Zolpidem Tartrate      Current Outpatient Prescriptions on File Prior to Visit  Medication Sig Dispense Refill  . albuterol (PROAIR HFA) 108 (90 BASE) MCG/ACT inhaler Inhale 2 puffs into the lungs every 4 (four) hours as needed for wheezing or shortness of breath. For shortness of breath/wheezing  1 Inhaler  prn  . albuterol (PROVENTIL) (2.5 MG/3ML) 0.083% nebulizer solution Take 3 mLs (2.5 mg total) by nebulization every 4 (four) hours as needed for wheezing or shortness of breath.  75 mL  12  . alendronate (FOSAMAX) 70 MG tablet Take 70 mg by mouth every 7 (seven) days. Takes on Tuesday Take with a full glass of water on an empty stomach.      Marland Kitchen. amoxicillin-clavulanate (AUGMENTIN) 875-125 MG per tablet Take 1 tablet by mouth 2 (two) times daily.  20 tablet  0  . aspirin EC 81 MG tablet Take 81 mg by mouth every other day.   150 tablet  2  . azithromycin (ZITHROMAX) 250 MG tablet 2 today then one daily- antibiotic  6 tablet  0  . budesonide-formoterol (SYMBICORT) 160-4.5 MCG/ACT inhaler Inhale 2 puffs into the lungs 2 (two) times daily. Rinse mouth  3 Inhaler  3  . Calcium-Magnesium-Vitamin D (ONE-A-DAY CALCIUM PLUS) 500-50-100 MG-MG-UNIT CHEW Chew 1 tablet by mouth 2 (two) times daily.        Marland Kitchen. EPINEPHrine (EPI-PEN) 0.3 mg/0.3 mL DEVI Inject 0.3 mg into the skin daily as needed. For anaphylaxis      . guaiFENesin (MUCINEX) 600 MG 12 hr tablet Take 1-2 mg by mouth every 12 (twelve) hours as needed. For cough      . magnesium oxide (MAG-OX) 400 MG tablet Take 1 tablet (400 mg total) by mouth daily.      . Multiple Vitamins-Minerals (CENTRUM SILVER PO) Take 1  tablet by mouth daily.        . Nebulizers (COMPRESSOR/NEBULIZER) MISC Use as directed  1 each  0  . omeprazole (PRILOSEC) 20 MG capsule Take 1 capsule (20 mg total) by mouth 2 (two) times daily.  60 capsule  1  . predniSONE (DELTASONE) 20 MG tablet TAKE 1 TABLET (20 MG TOTAL) BY MOUTH DAILY.  30 tablet  5  . propafenone (RYTHMOL) 225 MG tablet TAKE ONE TABLET BY MOUTH TWICE DAILY  60 tablet  11  . Sodium Bicarbonate POWD       . verapamil (CALAN-SR) 240 MG CR tablet TAKE 1 TABLET BY MOUTH EVERY DAY  30 tablet  2   No current facility-administered medications on file prior to visit.

## 2013-08-26 NOTE — Telephone Encounter (Signed)
Called, spoke with pt. Informed him of below recs per Dr. Maple HudsonYoung.  He verbalized understanding of this and is ok with having abx called in.  He is aware this was sent to [facility] and is to call back if symptoms do not improve or worsen and seek emergency care if needed.

## 2013-08-26 NOTE — Telephone Encounter (Signed)
Schedule is tight today. Suggest Rx cefdinir 300 mg, # 14, 1 twice daily. Extra fluids and Mucinex DM otc may help.

## 2013-09-01 NOTE — Assessment & Plan Note (Signed)
Plan-chest x-ray, Augmentin, slow prednisone taper, Depo-Medrol

## 2013-09-08 ENCOUNTER — Other Ambulatory Visit: Payer: Self-pay | Admitting: Internal Medicine

## 2013-10-03 ENCOUNTER — Telehealth: Payer: Self-pay | Admitting: Internal Medicine

## 2013-10-03 MED ORDER — ALBUTEROL SULFATE (2.5 MG/3ML) 0.083% IN NEBU: 2.5000 mg | INHALATION_SOLUTION | RESPIRATORY_TRACT | Status: DC

## 2013-10-03 NOTE — Telephone Encounter (Signed)
Rx has been sent to [facility].

## 2013-10-04 ENCOUNTER — Telehealth: Payer: Self-pay | Admitting: Internal Medicine

## 2013-10-04 NOTE — Telephone Encounter (Signed)
Error.Mark [name] ° °

## 2013-10-18 ENCOUNTER — Ambulatory Visit: Payer: Medicare Other | Admitting: Internal Medicine

## 2013-11-16 ENCOUNTER — Encounter: Payer: Self-pay | Admitting: Internal Medicine

## 2013-11-16 ENCOUNTER — Ambulatory Visit (INDEPENDENT_AMBULATORY_CARE_PROVIDER_SITE_OTHER): Payer: Medicare Other | Admitting: Internal Medicine

## 2013-11-16 VITALS — BP 136/78 | HR 91 | Ht 71.0 in | Wt 173.2 lb

## 2013-11-16 DIAGNOSIS — J302 Other seasonal allergic rhinitis: Secondary | ICD-10-CM

## 2013-11-16 DIAGNOSIS — J309 Allergic rhinitis, unspecified: Secondary | ICD-10-CM

## 2013-11-16 DIAGNOSIS — J449 Chronic obstructive pulmonary disease, unspecified: Secondary | ICD-10-CM

## 2013-11-16 DIAGNOSIS — J3089 Other allergic rhinitis: Secondary | ICD-10-CM

## 2013-11-16 NOTE — Patient Instructions (Signed)
We can continue present treatments  Please call as needed

## 2013-11-16 NOTE — Assessment & Plan Note (Addendum)
Getting around with mobility scooter, portable O2. Very limited Looks better at this visit.

## 2013-11-16 NOTE — Assessment & Plan Note (Signed)
Acceptable sniffing so far this year

## 2013-11-16 NOTE — Progress Notes (Signed)
Patient ID: Mark [name], male    DOB: 03/08/1941, 73 y.o.   MRN: [id number]  HPI 01/17/11- 73 yo with respiratory failure/ COPD, chronic AFib. Wife here Last here 11/04/10- Note reviewed Got a cold 2 weeks ago. Initally better. In last week-10 days has had more malaise, cough esp in last 3 days. Last antibiotic 2-3 months ago.  Went back up to 20 mg daily prednisone. Was never able to wean off last year when we tried. He has been on steroids many years..   05/07/11-  73 yo M former 3 PPD smoker with respiratory failure/ COPD, chronic AFib. Wife here No major changes. Uses nebulizer 3-5x/ day, infrequent use of rescue inhaler.Stays on prednisone 20 mg daily. He couldn't function at 15 mg daily.No recent infection. Fought off a mild cold a few weeks ago. He used his standby doxy for that and feels it helped. Daily mucinex. Daily cough with some phlegm. For flu vax today. Oxygen continuous- 1.5 - 2 L/M.  In past few weeks has noted pain down from right hip to ankle- discussed as likely pinched nerve/ compression fx from steroids. Spends much of each day sitting.   11/05/11-70 yo M former 3 PPD smoker with respiratory failure/ COPD/ steroid dependent, chronic AFib. Wife here. PCP Dr Jeannetta NapElkins [facility] but finally better in past month. Remains on prednisone 20 mg daily. He has been able to be much more active, feeling better. Up and around more in house on O2 2 L/M with less need for wheelchair and with no acute issues. Denies infection, blood, chest pain. Had injections for back pain.   05/07/12- 73 yo M former 3 PPD smoker with respiratory failure/ COPD/ steroid dependent, chronic AFib.   Wife here.  PCP Dr Jeannetta NapElkins Denies any SOB, wheezing, cough, or congestion for about the past 2 weeks Remains on oxygen 3 L/[employer].  [place]. I filled out the airplane form. They will be taking a portable concentrator. We discussed oxygen, altitude and  exertion. He remains steroid dependent 20 mg prednisone daily.  08/10/12- 89 yo M former 3 PPD smoker with respiratory failure/ COPD/ steroid dependent, chronic AFib.   Wife here.  PCP Dr Jeannetta NapElkins ACUTE VISIT: since Sunday having a hard time keep in O2 levels up even with O2; no energy and unable to do much activites. Came back in wheelchair and O2 was 86% on 3l/m cont. Had had a cold and started doxycycline yesterday. Denies fever,  chest pain, palpitation, little cough, no blood, no edema. Some pain across upper back and shoulders.  09/28/12- 73 yo M former 3 PPD smoker with respiratory failure/ COPD/ steroid dependent, chronic AFib.   Wife here.  Post Hospital: No more than usual SOB or wheezing since being in hospital prolonged stay 12/17-1/7 for prolonged R spontaneous PTX with chest tube.  Chest tube is still in with a drain, followed by Dr Donata ClayVan Trigt. Breathing is better. Antibiotics are finished. Continues oxygen 2 L. CT chest 08/26/12-  IMPRESSION:  1. Moderate right hydropneumothorax with right chest tube in  place, terminating in the anterior mediastinal region. Subcutaneous  emphysema along the right chest wall.  2. Postoperative changes in the right hemithorax with associated  soft tissue in the upper right hemithorax.  3. Centrilobular emphysema with possible superimposed airspace  disease in the right lower lobe. The latter is of unknown  chronicity and appears somewhat fibrotic.  Original Report Authenticated By: Leanna Battles, M.D. CXR 09/29/12 IMPRESSION:  1. Stable right-sided chest tube with persistent right-sided  pneumothorax.  2. Persistent right midlung density.  3. Stable severe underlying emphysematous changes.  Original Report Authenticated By: Rudie Meyer, M.D.  11/09/12- 15 yo M former 3 PPD smoker with respiratory failure/ COPD/ steroid dependent, chronic AFib.   Wife here.  FOLLOWS FOR: has had a hard time since last OV; was recently in hospital, admitted through  ED on 10/30/2012 when chest tube clotted. Fluid culture from tube was negative. He was treated for sepsis. Still feels weak/short of breath but pain is controlled. Little cough now and no fever or sweat CXR 11/03/12 IMPRESSION:  Stable position of the right chest tube and stable small right  pneumothorax.  Stable postsurgical changes in the right lung.  Original Report Authenticated By: Richarda Overlie, M.D.  6/181/4-  4 yo M former 3 PPD smoker with respiratory failure/ COPD/ steroid dependent, chronic AFib.   Wife here.  FOLLOWS FOR: continues to have SOB and gives out sooner Right chest tube has finally been removed and he says he is doing better O2 2-3 L/[employer]. Unable to wean prednisone below 20 mg daily CXR 12/22/12 IMPRESSION:  Interval removal of right chest tube. No evidence for residual  right-sided pneumothorax.  Postsurgical change in the right midlung with scarring noted at the  right base.  Original Report Authenticated By: Kennith Center, M.D.  06/16/13-  80 yo M former 3 PPD smoker with respiratory failure/ COPD/ steroid dependent, chronic AFib.   Wife here.  follows for-4 month rtn.  Pt c/o SOB constantly, yellow mucous brought out by clearing throat.  Some sinus congestion. Needs nebulizer machine/[employer]. Continues prednisone 20 mg daily long-term. Oxygen 2 L continuous. Had flu shot  08/11/13-72 yo M former 3 PPD smoker with respiratory failure/ COPD/ steroid dependent, chronic AFib.   Wife here.  Pt c/o increased SOB, fatigue, chest pain, productive cough with yellow phlegm  x 4 days.  Pt is currently taking zpak on day 3. Had caught a cold one week ago. Feels weak, coughing yellow/white. Nebulizer machine is broken. Using his home oxygen flow pressure to drive his nebulizer medication, used for 5 times daily. Chronic prednisone 20 mg daily. He understands issues of osteoporosis, immunosuppression, adrenal insufficiency. CXR 08/11/13-  IMPRESSION:  No acute cardiopulmonary  disease. No change from the prior study.  11/16/13- 34 yo M former 3 PPD smoker with respiratory failure/ COPD/ steroid dependent, chronic AFib.   Wife here. FOLLOWS FOR: since having mobile scooter patient is able to get around more and states his breathing is doing well.  No recent infection.  Often some wheeze- took neb earlier. O2 2-3L/M/ [employer]. Maintenance prednisone 20 mg/ day-chronic.   Review of Systems- see HPI Constitutional:   No-   weight loss, night sweats, fevers, chills, fatigue, lassitude. HEENT:   No-  headaches, difficulty swallowing, tooth/dental problems, sore throat,       No-  sneezing, itching, ear ache, nasal congestion, post nasal drip,  CV:  No-   chest pain, orthopnea, PND, no recent swelling in lower extremities, no-anasarca,                      dizziness, +palpitations Resp: + shortness of breath with exertion or at rest.              +cough,  No-  coughing up of blood.  No-   change in color of mucus.  +Slight wheezing.   Skin: No-   rash or lesions. GI:  No-   heartburn, indigestion, abdominal pain, nausea, vomiting, GU:  MS:  No-   joint pain or swelling.   Neuro- nothing unusual  Psych:  No- change in mood or affect. No depression or anxiety.  No memory loss.  Objective:   Physical Exam  General- Alert, Oriented, Affect-appropriate, Distress- none acute, walker, O2 2. l/m . Skin- rash-none, lesions- none, excoriation- none. Steroid fragility and echymoses Lymphadenopathy- none Head- atraumatic            Eyes- Gross vision intact, PERRLA, conjunctivae clear secretions            Ears- Hearing, canals normal            Nose- Clear, No-Septal dev, mucus, polyps, erosion, perforation             Throat- Mallampati II , mucosa clear , drainage- none, tonsils- atrophic Neck- flexible , trachea midline, no stridor , thyroid nl, carotid no bruit Chest - symmetrical excursion , unlabored           Heart/CV-+ IRR/ chronic AFib (no pacemaker) , no  murmur , no gallop  , no rub, nl s1 s2                            JVD- none , edema- trace, stasis changes- none, varices- none           Lung- +Very distant bilaterally, w/ diffuse slow expiratory wheeze,  Cough-none ,                         dullness-none, rub-none,  +Pursed lips. Bilateral breath sounds.           Chest wall-  Abd- Br/ Gen/ Rectal- Not done, not indicated Extrem- +power wheelchair Neuro- grossly intact to observation

## 2013-12-08 ENCOUNTER — Other Ambulatory Visit: Payer: Self-pay | Admitting: Internal Medicine

## 2014-01-02 ENCOUNTER — Other Ambulatory Visit: Payer: Self-pay | Admitting: Internal Medicine

## 2014-01-04 ENCOUNTER — Other Ambulatory Visit: Payer: Self-pay | Admitting: Internal Medicine

## 2014-01-11 ENCOUNTER — Ambulatory Visit (INDEPENDENT_AMBULATORY_CARE_PROVIDER_SITE_OTHER): Payer: Medicare Other | Admitting: Internal Medicine

## 2014-01-11 ENCOUNTER — Encounter: Payer: Self-pay | Admitting: Internal Medicine

## 2014-01-11 VITALS — BP 154/76 | HR 96 | Ht 71.0 in | Wt 168.0 lb

## 2014-01-11 DIAGNOSIS — I480 Paroxysmal atrial fibrillation: Secondary | ICD-10-CM

## 2014-01-11 DIAGNOSIS — I4891 Unspecified atrial fibrillation: Secondary | ICD-10-CM

## 2014-01-11 MED ORDER — FUROSEMIDE 20 MG PO TABS: 20.0000 mg | ORAL_TABLET | ORAL | Status: DC

## 2014-01-11 NOTE — Patient Instructions (Signed)
Your physician has recommended you make the following change in your medication:  1) Start Lasix 20 mg every other day  Your physician has recommended that you wear an event monitor. Event monitors are medical devices that record the heart's electrical activity. Doctors most often us these monitors to diagnose arrhythmias. Arrhythmias are problems with the speed or rhythm of the heartbeat. The monitor is a small, portable device. You can wear one while you do your normal daily activities. This is usually used to diagnose what is causing palpitations/syncope (passing out).  Your physician recommends that you schedule a follow-up appointment in: 5 weeks with Dr. Graciela HusbandsKlein.

## 2014-01-11 NOTE — Progress Notes (Signed)
Patient Care Team: Mark MaskWilson Oliver Elkins, MD as PCP - General (Family Medicine)   HPI  Mark [name] is a 73 y.o. male Seen in followup  for paroxysmal atrial fibrillation in the setting of severe COPD. He has taken propafenone in the past   His complaints of ongoing worsening dyspnea. He does have some peripheral edema.  In the past we discussed anticoagulation and he has been reluctant to undertake anticoagulants and has actually decreased his aspirin dose down to 81 mg every other day because of bruising  He has a history of a bleb resection via VATS complicated by prolonged air leak.   He has also had spells where in his O2 sat machine he is reported heart rates into the 40s. This is associated with increasing fatigue. He has not had problems with chest pain.  Past Medical History  Diagnosis Date  . Atrial fibrillation   . Hyperlipidemia   . Weakness   . Dyspnea   . Headache(784.0)   . Osteopenia   . COPD (chronic obstructive pulmonary disease)   . Allergic rhinitis     Past Surgical History  Procedure Laterality Date  . Catheter ablation    . Cataract extraction, bilateral    . Appendectomy    . Inguinal hernia repair    . Video assisted thoracoscopy (vats)/thorocotomy  08/17/2012    resection / stapling of blebs, mechanical pleurodesis   . Video assisted thoracoscopy  08/17/2012    Procedure: VIDEO ASSISTED THORACOSCOPY;  Surgeon: Kerin PernaPeter Van Trigt, MD;  [facility];  Service: Thoracic;  Laterality: Right;  . Resection of apical bleb  08/17/2012    Procedure: RESECTION OF APICAL BLEB;  Surgeon: Kerin PernaPeter Van Trigt, MD;  [facility];  Service: Thoracic;  Laterality: Right;  stapling of bleb  . Video bronchoscopy  08/27/2012    Procedure: VIDEO BRONCHOSCOPY;  Surgeon: Delight OvensEdward B Gerhardt, MD;  [facility];  Service: Thoracic;  Laterality: N/A;  evaluation of endobronchial valves    Current Outpatient Prescriptions  Medication Sig Dispense Refill  .  albuterol (PROAIR HFA) 108 (90 BASE) MCG/ACT inhaler Inhale 2 puffs into the lungs every 4 (four) hours as needed for wheezing or shortness of breath. For shortness of breath/wheezing  1 Inhaler  prn  . albuterol (PROVENTIL) (2.5 MG/3ML) 0.083% nebulizer solution Take 3 mLs (2.5 mg total) by nebulization every 4 (four) hours.  120 mL  5  . alendronate (FOSAMAX) 70 MG tablet Take 70 mg by mouth every 7 (seven) days. Takes on Tuesday Take with a full glass of water on an empty stomach.      Marland Kitchen. aspirin EC 81 MG tablet Take 81 mg by mouth every other day.   150 tablet  2  . budesonide-formoterol (SYMBICORT) 160-4.5 MCG/ACT inhaler Inhale 2 puffs into the lungs 2 (two) times daily. Rinse mouth  3 Inhaler  3  . Calcium-Magnesium-Vitamin D (ONE-A-DAY CALCIUM PLUS) 500-50-100 MG-MG-UNIT CHEW Chew 1 tablet by mouth 2 (two) times daily.        Marland Kitchen. EPINEPHrine (EPI-PEN) 0.3 mg/0.3 mL DEVI Inject 0.3 mg into the skin daily as needed. For anaphylaxis      . guaiFENesin (MUCINEX) 600 MG 12 hr tablet Take 1-2 mg by mouth every 12 (twelve) hours as needed. For cough      . magnesium oxide (MAG-OX) 400 MG tablet Take 1 tablet (400 mg total) by mouth daily.      . Multiple Vitamins-Minerals (CENTRUM SILVER  PO) Take 1 tablet by mouth daily.        . Nebulizers (COMPRESSOR/NEBULIZER) MISC Use as directed  1 each  0  . omeprazole (PRILOSEC) 20 MG capsule Take 1 capsule (20 mg total) by mouth 2 (two) times daily.  60 capsule  1  . predniSONE (DELTASONE) 20 MG tablet TAKE 1 TABLET (20 MG TOTAL) BY MOUTH DAILY.  30 tablet  5  . propafenone (RYTHMOL) 225 MG tablet TAKE ONE TABLET BY MOUTH TWICE DAILY  60 tablet  11  . Sodium Bicarbonate POWD       . verapamil (CALAN-SR) 240 MG CR tablet TAKE 1 TABLET BY MOUTH EVERY DAY  30 tablet  0   No current facility-administered medications for this visit.    Allergies  Allergen Reactions  . Shrimp [Shellfish Allergy]   . Zolpidem Tartrate     Review of Systems negative except  from HPI and PMH  Physical Exam BP 154/76  Pulse 96  Ht 5\' 11"  (1.803 m)  Wt 168 lb (76.204 kg)  BMI 23.44 kg/m2 Well developed and well nourished in moderate respiratory distress wearing oxygen  eral and icterus clear Neck Supple JVP flat; carotids brisk and full  markedly diminished breath sounds  Regular rate and rhythm, no murmurs gallops or rub Soft with active bowel sounds No clubbing cyanosis tr Edema Alert and oriented, grossly normal motor and sensory function Skin Warm and Dry   ECG demonstrates a sinus rhythm at 93 with intervals 14/09/36 Otherwise normal   Assessment and  Plan  Oxygen-dependent COPD  Atrial fibrillation  Brady palpitations  Hypertension  Peripheral edema  I wonder whether some of his dyspnea may not be an aggravation of his COPD by HFpEF  we will begin a low-dose diuretic Lasix 20 mg every other day; we'll need to check a metabolic profile in a few weeks.   I also discussed anticoagulation and alternatives to aspirin. She will consider this.  We will plan to use an event recorder to try to identify what is the cause of the heart rates recorded in the 40s. The event that the event recorder does not work, we will need to consider an implantable loop recorder.

## 2014-01-12 ENCOUNTER — Encounter: Payer: Self-pay | Admitting: *Deleted

## 2014-01-12 ENCOUNTER — Encounter (INDEPENDENT_AMBULATORY_CARE_PROVIDER_SITE_OTHER): Payer: Medicare Other

## 2014-01-12 DIAGNOSIS — I480 Paroxysmal atrial fibrillation: Secondary | ICD-10-CM

## 2014-01-12 DIAGNOSIS — I4891 Unspecified atrial fibrillation: Secondary | ICD-10-CM

## 2014-01-12 NOTE — Progress Notes (Signed)
Patient ID: Mark [name], male   DOB: 06/15/1941, 73 y.o.   MRN: [id number] E[employer]io verite 30 day cardiac event monitor applied to patient.

## 2014-01-29 ENCOUNTER — Other Ambulatory Visit: Payer: Self-pay | Admitting: Internal Medicine

## 2014-02-13 ENCOUNTER — Other Ambulatory Visit: Payer: Self-pay | Admitting: Internal Medicine

## 2014-02-21 ENCOUNTER — Encounter: Payer: Self-pay | Admitting: Internal Medicine

## 2014-02-21 ENCOUNTER — Ambulatory Visit (INDEPENDENT_AMBULATORY_CARE_PROVIDER_SITE_OTHER): Payer: Medicare Other | Admitting: Internal Medicine

## 2014-02-21 VITALS — BP 136/70 | HR 110 | Ht 71.0 in | Wt 168.0 lb

## 2014-02-21 DIAGNOSIS — I498 Other specified cardiac arrhythmias: Secondary | ICD-10-CM

## 2014-02-21 DIAGNOSIS — I480 Paroxysmal atrial fibrillation: Secondary | ICD-10-CM

## 2014-02-21 DIAGNOSIS — R001 Bradycardia, unspecified: Secondary | ICD-10-CM

## 2014-02-21 DIAGNOSIS — I4891 Unspecified atrial fibrillation: Secondary | ICD-10-CM

## 2014-02-21 LAB — CBC WITH DIFFERENTIAL/PLATELET
Basophils Absolute: 0 10*3/uL (ref 0.0–0.1)
Basophils Relative: 0.1 % (ref 0.0–3.0)
EOS PCT: 0.2 % (ref 0.0–5.0)
Eosinophils Absolute: 0 10*3/uL (ref 0.0–0.7)
HCT: 37.6 % — ABNORMAL LOW (ref 39.0–52.0)
HEMOGLOBIN: 11.9 g/dL — AB (ref 13.0–17.0)
Lymphocytes Relative: 18.7 % (ref 12.0–46.0)
Lymphs Abs: 2.4 10*3/uL (ref 0.7–4.0)
MCHC: 31.6 g/dL (ref 30.0–36.0)
MCV: 92.3 fl (ref 78.0–100.0)
MONOS PCT: 5.8 % (ref 3.0–12.0)
Monocytes Absolute: 0.7 10*3/uL (ref 0.1–1.0)
Neutro Abs: 9.6 10*3/uL — ABNORMAL HIGH (ref 1.4–7.7)
Neutrophils Relative %: 75.2 % (ref 43.0–77.0)
PLATELETS: 314 10*3/uL (ref 150.0–400.0)
RBC: 4.08 Mil/uL — ABNORMAL LOW (ref 4.22–5.81)
RDW: 16.2 % — ABNORMAL HIGH (ref 11.5–15.5)
WBC: 12.8 10*3/uL — ABNORMAL HIGH (ref 4.0–10.5)

## 2014-02-21 LAB — BASIC METABOLIC PANEL
BUN: 19 mg/dL (ref 6–23)
CHLORIDE: 101 meq/L (ref 96–112)
CO2: 31 mEq/L (ref 19–32)
Calcium: 9.2 mg/dL (ref 8.4–10.5)
Creatinine, Ser: 1.5 mg/dL (ref 0.4–1.5)
GFR: 47.24 mL/min — AB (ref >60.00)
Glucose, Bld: 169 mg/dL — ABNORMAL HIGH (ref 70–99)
POTASSIUM: 4.2 meq/L (ref 3.5–5.1)
SODIUM: 138 meq/L (ref 135–145)

## 2014-02-21 LAB — TSH: TSH: 0.76 u[IU]/mL (ref 0.35–4.50)

## 2014-02-21 NOTE — Patient Instructions (Addendum)
Your physician recommends that you continue on your current medications as directed. Please refer to the Current Medication list given to you today.  Lab today: BMET, CBCD, TSH  Your physician wants you to follow-up in: 6 months with Dr. Graciela HusbandsKlein. You will receive a reminder letter in the mail two months in advance. If you don't receive a letter, please call our office to schedule the follow-up appointment.

## 2014-02-21 NOTE — Progress Notes (Signed)
Patient Care Team: Kaleen MaskWilson Oliver Elkins, MD as PCP - General (Family Medicine)   HPI  Mark [name] is a 73 y.o. male Seen in followup for paroxysmal atrial fibrillation in the setting of severe COPD. He has taken propafenone in the past  His complaints of ongoing worsening dyspnea. He does have some peripheral edema.  He improved with the introduction of a low-dose diuretic we will decrease in edema and improved dyspnea.  In the past we discussed anticoagulation and he has been reluctant to undertake anticoagulants and has actually decreased his aspirin dose down to 81 mg every other day because of bruising   He   had spells where in his O2 sat machine he is reported heart rates into the 40s. This is associated with increasing fatigue.  The continued use of an event recorder to try to identify what was causing the measure her heart rate of 40. Recordings demonstrated only sinus rhythm with some PVCs and a pattern of bigeminy. He also had some PVCs but no tracking..   Past Medical History  Diagnosis Date  . Atrial fibrillation   . Hyperlipidemia   . Weakness   . Dyspnea   . Headache(784.0)   . Osteopenia   . COPD (chronic obstructive pulmonary disease)   . Allergic rhinitis     Past Surgical History  Procedure Laterality Date  . Catheter ablation    . Cataract extraction, bilateral    . Appendectomy    . Inguinal hernia repair    . Video assisted thoracoscopy (vats)/thorocotomy  08/17/2012    resection / stapling of blebs, mechanical pleurodesis   . Video assisted thoracoscopy  08/17/2012    Procedure: VIDEO ASSISTED THORACOSCOPY;  Surgeon: Kerin PernaPeter Van Trigt, MD;  [facility];  Service: Thoracic;  Laterality: Right;  . Resection of apical bleb  08/17/2012    Procedure: RESECTION OF APICAL BLEB;  Surgeon: Kerin PernaPeter Van Trigt, MD;  [facility];  Service: Thoracic;  Laterality: Right;  stapling of bleb  . Video bronchoscopy  08/27/2012    Procedure: VIDEO  BRONCHOSCOPY;  Surgeon: Delight OvensEdward B Gerhardt, MD;  [facility];  Service: Thoracic;  Laterality: N/A;  evaluation of endobronchial valves    Current Outpatient Prescriptions  Medication Sig Dispense Refill  . albuterol (PROAIR HFA) 108 (90 BASE) MCG/ACT inhaler Inhale 2 puffs into the lungs every 4 (four) hours as needed for wheezing or shortness of breath. For shortness of breath/wheezing  1 Inhaler  prn  . albuterol (PROVENTIL) (2.5 MG/3ML) 0.083% nebulizer solution Take 3 mLs (2.5 mg total) by nebulization every 4 (four) hours.  120 mL  5  . alendronate (FOSAMAX) 70 MG tablet Take 70 mg by mouth every 7 (seven) days. Takes on Tuesday Take with a full glass of water on an empty stomach.      Marland Kitchen. aspirin EC 81 MG tablet Take 81 mg by mouth every other day.   150 tablet  2  . budesonide-formoterol (SYMBICORT) 160-4.5 MCG/ACT inhaler Inhale 2 puffs into the lungs 2 (two) times daily. Rinse mouth  3 Inhaler  3  . Calcium-Magnesium-Vitamin D (ONE-A-DAY CALCIUM PLUS) 500-50-100 MG-MG-UNIT CHEW Chew 1 tablet by mouth 2 (two) times daily.        Marland Kitchen. EPINEPHrine (EPI-PEN) 0.3 mg/0.3 mL DEVI Inject 0.3 mg into the skin daily as needed. For anaphylaxis      . furosemide (LASIX) 20 MG tablet Take 1 tablet (20 mg total) by mouth every other  day.  15 tablet  2  . guaiFENesin (MUCINEX) 600 MG 12 hr tablet Take 1-2 mg by mouth every 12 (twelve) hours as needed. For cough      . magnesium oxide (MAG-OX) 400 MG tablet Take 1 tablet (400 mg total) by mouth daily.      . Multiple Vitamins-Minerals (CENTRUM SILVER PO) Take 1 tablet by mouth daily.        . Nebulizers (COMPRESSOR/NEBULIZER) MISC Use as directed  1 each  0  . omeprazole (PRILOSEC) 20 MG capsule Take 1 capsule (20 mg total) by mouth 2 (two) times daily.  60 capsule  1  . predniSONE (DELTASONE) 20 MG tablet TAKE 1 TABLET (20 MG TOTAL) BY MOUTH DAILY.  30 tablet  5  . propafenone (RYTHMOL) 225 MG tablet TAKE ONE TABLET BY MOUTH TWICE A DAY  60 tablet  5    . Sodium Bicarbonate POWD       . verapamil (CALAN-SR) 240 MG CR tablet TAKE 1 TABLET BY MOUTH EVERY DAY  30 tablet  0   No current facility-administered medications for this visit.    Allergies  Allergen Reactions  . Shrimp [Shellfish Allergy]   . Zolpidem Tartrate     Review of Systems negative except from HPI and PMH  Physical Exam BP 136/70  Pulse 110  Ht 5\' 11"  (1.803 m)  Wt 168 lb (76.204 kg)  BMI 23.44 kg/m2 Well developed and well nourished wearing oxygen in mod resp   distress HENT normal E scleral and icterus clear supple Clear to ausculation Rapid but Regular rate and rhythm, no murmurs gallops or rub Soft with active bowel sounds No clubbing cyanosis  Edema Alert and oriented, grossly normal motor and sensory function Skin Warm and Dry  Event recorder demonstrated PVCs and PACs with no significant arrhythmia  Assessment and  Plan  HFpEF   Oxygen-dependent COPD   PVCs/PACs   Sinus tachycardia   We will continue him on a low-dose diuretics.   We will check a metabolic profile follow the introduction of loop diuretic   With a sinus tachycardia, we'll exclude secondary causes including anemia and hyperthyroidism.   He has tolerated low-dose beta-blockade in the former propafenone which he takes for his atrial fibrillation. It is sinus tachycardia persists I wonder whether he may not benefit him augmented beta-blockade. The question for Dr. Maple HudsonYoung as does he think the tachycardia is secondary to his sympathomimetic nebulizers which might prompt a change in therapy, respiratory decompensation which would suggest  a more aggressive strategy; I don't think a primary arrhythmia; he is to see Dr. Maple HudsonYoung next month

## 2014-03-23 ENCOUNTER — Encounter: Payer: Self-pay | Admitting: Internal Medicine

## 2014-03-23 ENCOUNTER — Ambulatory Visit (INDEPENDENT_AMBULATORY_CARE_PROVIDER_SITE_OTHER): Payer: Medicare Other | Admitting: Internal Medicine

## 2014-03-23 VITALS — BP 120/60 | HR 91 | Ht 71.0 in | Wt 165.0 lb

## 2014-03-23 DIAGNOSIS — J449 Chronic obstructive pulmonary disease, unspecified: Secondary | ICD-10-CM

## 2014-03-23 DIAGNOSIS — J9383 Other pneumothorax: Secondary | ICD-10-CM

## 2014-03-23 NOTE — Progress Notes (Signed)
Patient ID: Mark [name], male    DOB: 11-09-1940, 73 y.o.   MRN: [id number]  HPI 01/17/11- 73 yo with respiratory failure/ COPD, chronic AFib. Wife here Last here 11/04/10- Note reviewed Got a cold 2 weeks ago. Initally better. In last week-10 days has had more malaise, cough esp in last 3 days. Last antibiotic 2-3 months ago.  Went back up to 20 mg daily prednisone. Was never able to wean off last year when we tried. He has been on steroids many years..   05/07/11-  50 yo M former 3 PPD smoker with respiratory failure/ COPD, chronic AFib. Wife here No major changes. Uses nebulizer 3-5x/ day, infrequent use of rescue inhaler.Stays on prednisone 20 mg daily. He couldn't function at 15 mg daily.No recent infection. Fought off a mild cold a few weeks ago. He used his standby doxy for that and feels it helped. Daily mucinex. Daily cough with some phlegm. For flu vax today. Oxygen continuous- 1.5 - 2 L/M.  In past few weeks has noted pain down from right hip to ankle- discussed as likely pinched nerve/ compression fx from steroids. Spends much of each day sitting.   11/05/11-70 yo M former 3 PPD smoker with respiratory failure/ COPD/ steroid dependent, chronic AFib. Wife here. PCP Dr [name] Had chest cold at [facility] but finally better in past month. Remains on prednisone 20 mg daily. He has been able to be much more active, feeling better. Up and around more in house on O2 2 L/M with less need for wheelchair and with no acute issues. Denies infection, blood, chest pain. Had injections for back pain.   05/07/12- 28 yo M former 3 PPD smoker with respiratory failure/ COPD/ steroid dependent, chronic AFib.   Wife here.  PCP Dr [name] Denies any SOB, wheezing, cough, or congestion for about the past 2 weeks Remains on oxygen 3 L/[employer].  They plan to fly to [facility] for a family wedding. I filled out the airplane form. They will be taking a portable concentrator. We discussed oxygen, altitude and  exertion. He remains steroid dependent 20 mg prednisone daily.  08/10/12- 39 yo M former 3 PPD smoker with respiratory failure/ COPD/ steroid dependent, chronic AFib.   Wife here.  PCP Dr [name] ACUTE VISIT: since Sunday having a hard time keep in O2 levels up even with O2; no energy and unable to do much activites. Came back in wheelchair and O2 was 86% on 3l/m cont. Had had a cold and started doxycycline yesterday. Denies fever,  chest pain, palpitation, little cough, no blood, no edema. Some pain across upper back and shoulders.  09/28/12- 24 yo M former 3 PPD smoker with respiratory failure/ COPD/ steroid dependent, chronic AFib.   Wife here.  Post Hospital: No more than usual SOB or wheezing since being in hospital prolonged stay 12/17-1/7 for prolonged R spontaneous PTX with chest tube.  Chest tube is still in with a drain, followed by Dr Donata Clay. Breathing is better. Antibiotics are finished. Continues oxygen 2 L. CT chest 08/26/12-  IMPRESSION:  1. Moderate right hydropneumothorax with right chest tube in  place, terminating in the anterior mediastinal region. Subcutaneous  emphysema along the right chest wall.  2. Postoperative changes in the right hemithorax with associated  soft tissue in the upper right hemithorax.  3. Centrilobular emphysema with possible superimposed airspace  disease in the right lower lobe. The latter is of unknown  chronicity and appears somewhat fibrotic.  Original Report Authenticated By: Leanna BattlesMelinda Blietz, M.D. CXR 09/29/12 IMPRESSION:  1. Stable right-sided chest tube with persistent right-sided  pneumothorax.  2. Persistent right midlung density.  3. Stable severe underlying emphysematous changes.  Original Report Authenticated By: Rudie MeyerP. Gallerani, M.D.  11/09/12- 73 yo M former 3 PPD smoker with respiratory failure/ COPD/ steroid dependent, chronic AFib.   Wife here.  FOLLOWS FOR: has had a hard time since last OV; was recently in hospital, admitted through  ED on 10/30/2012 when chest tube clotted. Fluid culture from tube was negative. He was treated for sepsis. Still feels weak/short of breath but pain is controlled. Little cough now and no fever or sweat CXR 11/03/12 IMPRESSION:  Stable position of the right chest tube and stable small right  pneumothorax.  Stable postsurgical changes in the right lung.  Original Report Authenticated By: Richarda OverlieAdam Henn, M.D.  6/181/449-  72 yo M former 3 PPD smoker with respiratory failure/ COPD/ steroid dependent, chronic AFib.   Wife here.  FOLLOWS FOR: continues to have SOB and gives out sooner Right chest tube has finally been removed and he says he is doing better O2 2-3 L/[employer]. Unable to wean prednisone below 20 mg daily CXR 12/22/12 IMPRESSION:  Interval removal of right chest tube. No evidence for residual  right-sided pneumothorax.  Postsurgical change in the right midlung with scarring noted at the  right base.  Original Report Authenticated By: Kennith CenterEric Mansell, M.D.  06/16/13-  73 yo M former 3 PPD smoker with respiratory failure/ COPD/ steroid dependent, chronic AFib.   Wife here.  follows for-4 month rtn.  Pt c/o SOB constantly, yellow mucous brought out by clearing throat.  Some sinus congestion. Needs nebulizer machine/[employer]. Continues prednisone 20 mg daily long-term. Oxygen 2 L continuous. Had flu shot  08/11/13-72 yo M former 3 PPD smoker with respiratory failure/ COPD/ steroid dependent, chronic AFib.   Wife here.  Pt c/o increased SOB, fatigue, chest pain, productive cough with yellow phlegm  x 4 days.  Pt is currently taking zpak on day 3. Had caught a cold one week ago. Feels weak, coughing yellow/white. Nebulizer machine is broken. Using his home oxygen flow pressure to drive his nebulizer medication, used for 5 times daily. Chronic prednisone 20 mg daily. He understands issues of osteoporosis, immunosuppression, adrenal insufficiency. CXR 08/11/13-  IMPRESSION:  No acute cardiopulmonary  disease. No change from the prior study.  11/16/13- 73 yo M former 3 PPD smoker with respiratory failure/ COPD/ steroid dependent, chronic AFib.   Wife here. FOLLOWS FOR: since having mobile scooter patient is able to get around more and states his breathing is doing well.  No recent infection.  Often some wheeze- took neb earlier. O2 2-3L/M/ [employer]. Maintenance prednisone 20 mg/ day-chronic.   03/23/14- 73 yo M former 3 PPD smoker with respiratory failure/ COPD/ steroid dependent, chronic AFib.   Wife here FOLLOWS FOR:  breathing has been fair per pt.  he stated that he is just not able to do anything.  He thinks he is gradually declining, which is what we would expect for his disease progression. No acute event. Occasional ankle edema has been better controlled.  O2 2-3L/M/ [employer]. Maintenance prednisone 20 mg/ day-chronic.   Review of Systems- see HPI Constitutional:   No-   weight loss, night sweats, fevers, chills, fatigue, lassitude. HEENT:   No-  headaches, difficulty swallowing, tooth/dental problems, sore throat,       No-  sneezing, itching, ear ache, nasal congestion, post nasal  drip,  CV:  No-   chest pain, orthopnea, PND, no recent swelling in lower extremities, no-anasarca, dizziness, +palpitations Resp: + shortness of breath with exertion or at rest.              +cough,  No-  coughing up of blood.              No-   change in color of mucus.  +Slight wheezing.   Skin: No-   rash or lesions. GI:  No-   heartburn, indigestion, abdominal pain, nausea, vomiting, GU:  MS:  No-   joint pain or swelling.   Neuro- nothing unusual  Psych:  No- change in mood or affect. No depression or anxiety.  No memory loss.  Objective:   Physical Exam General- Alert, Oriented, Affect-appropriate, Distress- none acute, walker, O2 2. l/m . Skin- rash-none, lesions- none, excoriation- none. Steroid fragility and echymoses Lymphadenopathy- none Head- atraumatic            Eyes- Gross vision  intact, PERRLA, conjunctivae clear secretions            Ears- Hearing, canals normal            Nose- Clear, No-Septal dev, mucus, polyps, erosion, perforation             Throat- Mallampati II , mucosa clear , drainage- none, tonsils- atrophic Neck- flexible , trachea midline, no stridor , thyroid nl, carotid no bruit Chest - symmetrical excursion , unlabored           Heart/CV-+ IRR/ chronic AFib (no pacemaker) , no murmur , no gallop , no rub, nl s1 s2                            JVD- none , edema- trace, stasis changes- none, varices- none           Lung- +Very distant bilaterally, w/ diffuse slow expiratory wheeze,  Cough-none ,  dullness-none, rub-none,  +Pursed lips. Bilateral breath sounds.           Chest wall-  Abd- Br/ Gen/ Rectal- Not done, not indicated Extrem- +power wheelchair Neuro- grossly intact to observation

## 2014-03-23 NOTE — Patient Instructions (Signed)
Ok to see if Symbicort is worth the cost:   Try using it regularly twice daily for a week, then stop for 2 weeks, then restart it. See if you see any difference.

## 2014-04-01 ENCOUNTER — Other Ambulatory Visit: Payer: Self-pay | Admitting: Internal Medicine

## 2014-04-03 ENCOUNTER — Other Ambulatory Visit: Payer: Self-pay

## 2014-04-03 MED ORDER — PROPAFENONE HCL 225 MG PO TABS: ORAL_TABLET | ORAL | Status: DC

## 2014-06-26 ENCOUNTER — Telehealth: Payer: Self-pay | Admitting: Internal Medicine

## 2014-06-26 MED ORDER — BUDESONIDE-FORMOTEROL FUMARATE 160-4.5 MCG/ACT IN AERO: 2.0000 | INHALATION_SPRAY | Freq: Two times a day (BID) | RESPIRATORY_TRACT | Status: DC

## 2014-06-26 NOTE — Telephone Encounter (Signed)
Pt aware Rx has been sent as requested and reminded to keep follow up appt on 07-24-14 with CY. Nothing more needed at this time.

## 2014-07-02 NOTE — Assessment & Plan Note (Signed)
He is not sure Symbicort makes any difference. He will try without to assess cost effectiveness. Medication discussion.

## 2014-07-02 NOTE — Assessment & Plan Note (Signed)
No recurrence. 

## 2014-07-12 ENCOUNTER — Other Ambulatory Visit: Payer: Self-pay | Admitting: Internal Medicine

## 2014-07-16 ENCOUNTER — Other Ambulatory Visit: Payer: Self-pay | Admitting: Internal Medicine

## 2014-07-24 ENCOUNTER — Ambulatory Visit (INDEPENDENT_AMBULATORY_CARE_PROVIDER_SITE_OTHER)
Admission: RE | Admit: 2014-07-24 | Discharge: 2014-07-24 | Disposition: A | Discharge status: Home / Self Care | Payer: Medicare Other | Source: Ambulatory Visit | Attending: Internal Medicine | Admitting: Internal Medicine

## 2014-07-24 ENCOUNTER — Ambulatory Visit: Payer: Medicare Other | Admitting: Internal Medicine

## 2014-07-24 ENCOUNTER — Encounter: Payer: Self-pay | Admitting: Internal Medicine

## 2014-07-24 VITALS — BP 110/60 | HR 88 | Ht 71.0 in | Wt 165.0 lb

## 2014-07-24 DIAGNOSIS — J449 Chronic obstructive pulmonary disease, unspecified: Secondary | ICD-10-CM

## 2014-07-24 MED ORDER — AZITHROMYCIN 250 MG PO TABS: ORAL_TABLET | ORAL | Status: DC

## 2014-07-24 MED ORDER — EPINEPHRINE 0.3 MG/0.3ML IJ SOAJ: 0.3000 mg | Freq: Once | INTRAMUSCULAR | Status: DC

## 2014-07-24 MED ORDER — UMECLIDINIUM-VILANTEROL 62.5-25 MCG/INH IN AEPB: 1.0000 | INHALATION_SPRAY | Freq: Every day | RESPIRATORY_TRACT | Status: DC

## 2014-07-24 NOTE — Patient Instructions (Signed)
Script to refill Epipen sent   Script for Zpak to hold in case of infection  Sample x 2 Anoro  1 puff, once daily       Try this instead of Symbicort for comparison  Order CXR   Dx COPD mixed type with exacerbation

## 2014-07-24 NOTE — Progress Notes (Signed)
Patient ID: Mark [name], male    DOB: 11-09-1940, 73 y.o.   MRN: [id number]  HPI 01/17/11- 73 yo with respiratory failure/ COPD, chronic AFib. Wife here Last here 11/04/10- Note reviewed Got a cold 2 weeks ago. Initally better. In last week-10 days has had more malaise, cough esp in last 3 days. Last antibiotic 2-3 months ago.  Went back up to 20 mg daily prednisone. Was never able to wean off last year when we tried. He has been on steroids many years..   05/07/11-  50 yo M former 3 PPD smoker with respiratory failure/ COPD, chronic AFib. Wife here No major changes. Uses nebulizer 3-5x/ day, infrequent use of rescue inhaler.Stays on prednisone 20 mg daily. He couldn't function at 15 mg daily.No recent infection. Fought off a mild cold a few weeks ago. He used his standby doxy for that and feels it helped. Daily mucinex. Daily cough with some phlegm. For flu vax today. Oxygen continuous- 1.5 - 2 L/M.  In past few weeks has noted pain down from right hip to ankle- discussed as likely pinched nerve/ compression fx from steroids. Spends much of each day sitting.   11/05/11-70 yo M former 3 PPD smoker with respiratory failure/ COPD/ steroid dependent, chronic AFib. Wife here. PCP Dr [name] Had chest cold at [facility] but finally better in past month. Remains on prednisone 20 mg daily. He has been able to be much more active, feeling better. Up and around more in house on O2 2 L/M with less need for wheelchair and with no acute issues. Denies infection, blood, chest pain. Had injections for back pain.   05/07/12- 28 yo M former 3 PPD smoker with respiratory failure/ COPD/ steroid dependent, chronic AFib.   Wife here.  PCP Dr [name] Denies any SOB, wheezing, cough, or congestion for about the past 2 weeks Remains on oxygen 3 L/[employer].  They plan to fly to [facility] for a family wedding. I filled out the airplane form. They will be taking a portable concentrator. We discussed oxygen, altitude and  exertion. He remains steroid dependent 20 mg prednisone daily.  08/10/12- 39 yo M former 3 PPD smoker with respiratory failure/ COPD/ steroid dependent, chronic AFib.   Wife here.  PCP Dr [name] ACUTE VISIT: since Sunday having a hard time keep in O2 levels up even with O2; no energy and unable to do much activites. Came back in wheelchair and O2 was 86% on 3l/m cont. Had had a cold and started doxycycline yesterday. Denies fever,  chest pain, palpitation, little cough, no blood, no edema. Some pain across upper back and shoulders.  09/28/12- 24 yo M former 3 PPD smoker with respiratory failure/ COPD/ steroid dependent, chronic AFib.   Wife here.  Post Hospital: No more than usual SOB or wheezing since being in hospital prolonged stay 12/17-1/7 for prolonged R spontaneous PTX with chest tube.  Chest tube is still in with a drain, followed by Dr Donata Clay. Breathing is better. Antibiotics are finished. Continues oxygen 2 L. CT chest 08/26/12-  IMPRESSION:  1. Moderate right hydropneumothorax with right chest tube in  place, terminating in the anterior mediastinal region. Subcutaneous  emphysema along the right chest wall.  2. Postoperative changes in the right hemithorax with associated  soft tissue in the upper right hemithorax.  3. Centrilobular emphysema with possible superimposed airspace  disease in the right lower lobe. The latter is of unknown  chronicity and appears somewhat fibrotic.  Original Report Authenticated By: Leanna BattlesMelinda Blietz, M.D. CXR 09/29/12 IMPRESSION:  1. Stable right-sided chest tube with persistent right-sided  pneumothorax.  2. Persistent right midlung density.  3. Stable severe underlying emphysematous changes.  Original Report Authenticated By: Rudie MeyerP. Gallerani, M.D.  11/09/12- 89 yo M former 3 PPD smoker with respiratory failure/ COPD/ steroid dependent, chronic AFib.   Wife here.  FOLLOWS FOR: has had a hard time since last OV; was recently in hospital, admitted through  ED on 10/30/2012 when chest tube clotted. Fluid culture from tube was negative. He was treated for sepsis. Still feels weak/short of breath but pain is controlled. Little cough now and no fever or sweat CXR 11/03/12 IMPRESSION:  Stable position of the right chest tube and stable small right  pneumothorax.  Stable postsurgical changes in the right lung.  Original Report Authenticated By: Richarda OverlieAdam Henn, M.D.  6/181/454-  72 yo M former 3 PPD smoker with respiratory failure/ COPD/ steroid dependent, chronic AFib.   Wife here.  FOLLOWS FOR: continues to have SOB and gives out sooner Right chest tube has finally been removed and he says he is doing better O2 2-3 L/[employer]. Unable to wean prednisone below 20 mg daily CXR 12/22/12 IMPRESSION:  Interval removal of right chest tube. No evidence for residual  right-sided pneumothorax.  Postsurgical change in the right midlung with scarring noted at the  right base.  Original Report Authenticated By: Kennith CenterEric Mansell, M.D.  06/16/13-  73 yo M former 3 PPD smoker with respiratory failure/ COPD/ steroid dependent, chronic AFib.   Wife here.  follows for-4 month rtn.  Pt c/o SOB constantly, yellow mucous brought out by clearing throat.  Some sinus congestion. Needs nebulizer machine/[employer]. Continues prednisone 20 mg daily long-term. Oxygen 2 L continuous. Had flu shot  08/11/13-72 yo M former 3 PPD smoker with respiratory failure/ COPD/ steroid dependent, chronic AFib.   Wife here.  Pt c/o increased SOB, fatigue, chest pain, productive cough with yellow phlegm  x 4 days.  Pt is currently taking zpak on day 3. Had caught a cold one week ago. Feels weak, coughing yellow/white. Nebulizer machine is broken. Using his home oxygen flow pressure to drive his nebulizer medication, used for 5 times daily. Chronic prednisone 20 mg daily. He understands issues of osteoporosis, immunosuppression, adrenal insufficiency. CXR 08/11/13-  IMPRESSION:  No acute cardiopulmonary  disease. No change from the prior study.  11/16/13- 73 yo M former 3 PPD smoker with respiratory failure/ COPD/ steroid dependent, chronic AFib.   Wife here. FOLLOWS FOR: since having mobile scooter patient is able to get around more and states his breathing is doing well.  No recent infection.  Often some wheeze- took neb earlier. O2 2-3L/M/ [employer]. Maintenance prednisone 20 mg/ day-chronic.   03/23/14- 73 yo M former 3 PPD smoker with respiratory failure/ COPD/ steroid dependent, chronic AFib.   Wife here FOLLOWS FOR:  breathing has been fair per pt.  he stated that he is just not able to do anything.  He thinks he is gradually declining, which is what we would expect for his disease progression. No acute event. Occasional ankle edema has been better controlled.  O2 2-3L/M/ [employer]. Maintenance prednisone 20 mg/ day-chronic.   07/24/14- 73 yo M former 3 PPD smoker with respiratory failure/ COPD/ steroid dependent, chronic AFib.   Wife here FOLLOWS FOR: Not breathing as well as he would like. Continues O2 2-3L 24/7; DME is [employer]    Review of Systems- see HPI Constitutional:  No-   weight loss, night sweats, fevers, chills, fatigue, lassitude. HEENT:   No-  headaches, difficulty swallowing, tooth/dental problems, sore throat,       No-  sneezing, itching, ear ache, nasal congestion, post nasal drip,  CV:  No-   chest pain, orthopnea, PND, no recent swelling in lower extremities, no-anasarca, dizziness, +palpitations Resp: + shortness of breath with exertion or at rest.              +cough,  No-  coughing up of blood.              No-   change in color of mucus.  +Slight wheezing.   Skin: No-   rash or lesions. GI:  No-   heartburn, indigestion, abdominal pain, nausea, vomiting, GU:  MS:  No-   joint pain or swelling.   Neuro- nothing unusual  Psych:  No- change in mood or affect. No depression or anxiety.  No memory loss.  Objective:   Physical Exam General- Alert, Oriented,  Affect-appropriate, Distress- none acute, walker, O2 2. l/m . Skin- rash-none, lesions- none, excoriation- none. Steroid fragility and echymoses Lymphadenopathy- none Head- atraumatic            Eyes- Gross vision intact, PERRLA, conjunctivae clear secretions            Ears- Hearing, canals normal            Nose- Clear, No-Septal dev, mucus, polyps, erosion, perforation             Throat- Mallampati II , mucosa clear , drainage- none, tonsils- atrophic Neck- flexible , trachea midline, no stridor , thyroid nl, carotid no bruit Chest - symmetrical excursion , unlabored           Heart/CV-+ IRR/ chronic AFib (no pacemaker) , no murmur , no gallop , no rub, nl s1 s2                            JVD- none , edema- trace, stasis changes- none, varices- none           Lung- +Very distant bilaterally, w/ diffuse slow expiratory wheeze,  Cough-none ,  dullness-none, rub-none,  +Pursed lips. Bilateral breath sounds.           Chest wall-  Abd- Br/ Gen/ Rectal- Not done, not indicated Extrem- +power wheelchair Neuro- grossly intact to observation

## 2014-08-07 ENCOUNTER — Telehealth: Payer: Self-pay | Admitting: Internal Medicine

## 2014-08-07 MED ORDER — UMECLIDINIUM-VILANTEROL 62.5-25 MCG/INH IN AEPB: 1.0000 | INHALATION_SPRAY | Freq: Every day | RESPIRATORY_TRACT | Status: DC

## 2014-08-07 NOTE — Telephone Encounter (Signed)
Per 07/24/14: Patient Instructions       Script to refill Epipen sent  Script for Zpak to hold in case of infection Sample x 2 Anoro  1 puff, once daily       Try this instead of Symbicort for comparison Order CXR   Dx COPD mixed type with exacerbation  --  Called spoke with pt. He reports the anoro has helped a lot with his breathing. He can tell a big difference. Wants RX sent in. I have done so. Nothing further needed

## 2014-10-29 ENCOUNTER — Other Ambulatory Visit: Payer: Self-pay | Admitting: Internal Medicine

## 2014-11-16 ENCOUNTER — Telehealth: Payer: Self-pay | Admitting: Internal Medicine

## 2014-11-16 MED ORDER — AMOXICILLIN-POT CLAVULANATE 500-125 MG PO TABS: 1.0000 | ORAL_TABLET | Freq: Two times a day (BID) | ORAL | Status: DC

## 2014-11-16 NOTE — Telephone Encounter (Signed)
Per CY: okay to change to Augmentin 500mg  1 po BID #14.  Thanks.  Called spoke with patient and advised of change in abx.  Pt okay with this change and voiced his understanding.  He is aware to contact the office if symptoms do not improve or they worsen.  Rx sent to verified pharmacy.

## 2014-11-16 NOTE — Telephone Encounter (Signed)
Spoke with pt. Reports coughing with production of yellow mucus, SOB, chest tightness. Onset was this morning. Denies fever. Would like something called in.  Allergies  Allergen Reactions  . Shrimp [Shellfish Allergy]   . Zolpidem Tartrate     CY - please advise. Thanks.

## 2014-11-16 NOTE — Telephone Encounter (Signed)
Pt cb to see what CY is going to do, please cb at number listed

## 2014-11-16 NOTE — Telephone Encounter (Signed)
Suggest Z pak, Mucinex DM 

## 2014-11-16 NOTE — Telephone Encounter (Signed)
Drug to drug interaction with zpak and Rythmol with risk of causing dysrhythmias.   CY please advise.

## 2014-11-16 NOTE — Telephone Encounter (Signed)
Pt has called back. Advised him that we are waiting on CY's response.

## 2014-11-22 ENCOUNTER — Encounter: Payer: Self-pay | Admitting: Internal Medicine

## 2014-11-22 ENCOUNTER — Ambulatory Visit (INDEPENDENT_AMBULATORY_CARE_PROVIDER_SITE_OTHER)
Admission: RE | Admit: 2014-11-22 | Discharge: 2014-11-22 | Disposition: A | Discharge status: Home / Self Care | Payer: Medicare Other | Source: Ambulatory Visit | Attending: Internal Medicine | Admitting: Internal Medicine

## 2014-11-22 ENCOUNTER — Ambulatory Visit (INDEPENDENT_AMBULATORY_CARE_PROVIDER_SITE_OTHER): Payer: Medicare Other | Admitting: Internal Medicine

## 2014-11-22 VITALS — BP 120/74 | HR 87 | Ht 71.0 in | Wt 168.4 lb

## 2014-11-22 DIAGNOSIS — J441 Chronic obstructive pulmonary disease with (acute) exacerbation: Secondary | ICD-10-CM

## 2014-11-22 DIAGNOSIS — I482 Chronic atrial fibrillation, unspecified: Secondary | ICD-10-CM

## 2014-11-22 DIAGNOSIS — J449 Chronic obstructive pulmonary disease, unspecified: Secondary | ICD-10-CM

## 2014-11-22 MED ORDER — UMECLIDINIUM-VILANTEROL 62.5-25 MCG/INH IN AEPB: 1.0000 | INHALATION_SPRAY | Freq: Every day | RESPIRATORY_TRACT | Status: DC

## 2014-11-22 MED ORDER — AMOXICILLIN 500 MG PO CAPS: 500.0000 mg | ORAL_CAPSULE | Freq: Three times a day (TID) | ORAL | Status: DC

## 2014-11-22 NOTE — Progress Notes (Signed)
Quick Note:  Called and spoke to pt. Informed pt of the results and recs per CY. Pt verbalized understanding and denied any further questions or concerns at this time. ______ 

## 2014-11-22 NOTE — Patient Instructions (Signed)
Script sent for amoxacillin to hold  You can keep the oxygen set at 3L  Order - CXR  Dx COPD with acute exacerbation

## 2014-11-22 NOTE — Progress Notes (Signed)
Patient ID: Mark [name], male    DOB: 01/21/1941, 74 y.o.   MRN: [id number]  HPI 01/17/11- 74 yo with respiratory failure/ COPD, chronic AFib. Wife here Last here 11/04/10- Note reviewed Got a cold 2 weeks ago. Initally better. In last week-10 days has had more malaise, cough esp in last 3 days. Last antibiotic 2-3 months ago.  Went back up to 20 mg daily prednisone. Was never able to wean off last year when we tried. He has been on steroids many years..   05/07/11-  74 yo M former 3 PPD smoker with respiratory failure/ COPD, chronic AFib. Wife here No major changes. Uses nebulizer 3-5x/ day, infrequent use of rescue inhaler.Stays on prednisone 20 mg daily. He couldn't function at 15 mg daily.No recent infection. Fought off a mild cold a few weeks ago. He used his standby doxy for that and feels it helped. Daily mucinex. Daily cough with some phlegm. For flu vax today. Oxygen continuous- 1.5 - 2 L/M.  In past few weeks has noted pain down from right hip to ankle- discussed as likely pinched nerve/ compression fx from steroids. Spends much of each day sitting.   11/05/11-70 yo M former 3 PPD smoker with respiratory failure/ COPD/ steroid dependent, chronic AFib. Wife here. PCP Dr Jeannetta [facility]kins [facility] finally better in past month. Remains on prednisone 20 mg daily. He has been able to be much more active, feeling better. Up and around more in house on O2 2 L/M with less need for wheelchair and with no acute issues. Denies infection, blood, chest pain. Had injections for back pain.   05/07/12- 74 yo M former 3 PPD smoker with respiratory failure/ COPD/ steroid dependent, chronic AFib.   Wife here.  PCP Dr Jeannetta NapElkins Denies any SOB, wheezing, cough, or congestion for about the past 2 weeks Remains on oxygen 3 L/[employer].  [place]. I filled out the airplane form. They will be taking a portable concentrator. We discussed oxygen, altitude and  exertion. He remains steroid dependent 20 mg prednisone daily.  08/10/12- 74 yo M former 3 PPD smoker with respiratory failure/ COPD/ steroid dependent, chronic AFib.   Wife here.  PCP Dr Jeannetta NapElkins ACUTE VISIT: since Sunday having a hard time keep in O2 levels up even with O2; no energy and unable to do much activites. Came back in wheelchair and O2 was 86% on 3l/m cont. Had had a cold and started doxycycline yesterday. Denies fever,  chest pain, palpitation, little cough, no blood, no edema. Some pain across upper back and shoulders.  09/28/12- 74 yo M former 3 PPD smoker with respiratory failure/ COPD/ steroid dependent, chronic AFib.   Wife here.  Post Hospital: No more than usual SOB or wheezing since being in hospital prolonged stay 12/17-1/7 for prolonged R spontaneous PTX with chest tube.  Chest tube is still in with a drain, followed by Dr Donata ClayVan Trigt. Breathing is better. Antibiotics are finished. Continues oxygen 2 L. CT chest 08/26/12-  IMPRESSION:  1. Moderate right hydropneumothorax with right chest tube in  place, terminating in the anterior mediastinal region. Subcutaneous  emphysema along the right chest wall.  2. Postoperative changes in the right hemithorax with associated  soft tissue in the upper right hemithorax.  3. Centrilobular emphysema with possible superimposed airspace  disease in the right lower lobe. The latter is of unknown  chronicity and appears somewhat fibrotic.  Original Report Authenticated By: Leanna BattlesMelinda Blietz, M.D. CXR 09/29/12 IMPRESSION:  1. Stable right-sided chest tube with persistent right-sided  pneumothorax.  2. Persistent right midlung density.  3. Stable severe underlying emphysematous changes.  Original Report Authenticated By: Rudie MeyerP. Gallerani, M.D.  11/09/12- 74 yo M former 3 PPD smoker with respiratory failure/ COPD/ steroid dependent, chronic AFib.   Wife here.  FOLLOWS FOR: has had a hard time since last OV; was recently in hospital, admitted through  ED on 10/30/2012 when chest tube clotted. Fluid culture from tube was negative. He was treated for sepsis. Still feels weak/short of breath but pain is controlled. Little cough now and no fever or sweat CXR 11/03/12 IMPRESSION:  Stable position of the right chest tube and stable small right  pneumothorax.  Stable postsurgical changes in the right lung.  Original Report Authenticated By: Richarda OverlieAdam Henn, M.D.  6/181/473-  72 yo M former 3 PPD smoker with respiratory failure/ COPD/ steroid dependent, chronic AFib.   Wife here.  FOLLOWS FOR: continues to have SOB and gives out sooner Right chest tube has finally been removed and he says he is doing better O2 2-3 L/[employer]. Unable to wean prednisone below 20 mg daily CXR 12/22/12 IMPRESSION:  Interval removal of right chest tube. No evidence for residual  right-sided pneumothorax.  Postsurgical change in the right midlung with scarring noted at the  right base.  Original Report Authenticated By: Kennith CenterEric Mansell, M.D.  06/16/13-  74 yo M former 3 PPD smoker with respiratory failure/ COPD/ steroid dependent, chronic AFib.   Wife here.  follows for-4 month rtn.  Pt c/o SOB constantly, yellow mucous brought out by clearing throat.  Some sinus congestion. Needs nebulizer machine/[employer]. Continues prednisone 20 mg daily long-term. Oxygen 2 L continuous. Had flu shot  08/11/13-72 yo M former 3 PPD smoker with respiratory failure/ COPD/ steroid dependent, chronic AFib.   Wife here.  Pt c/o increased SOB, fatigue, chest pain, productive cough with yellow phlegm  x 4 days.  Pt is currently taking zpak on day 3. Had caught a cold one week ago. Feels weak, coughing yellow/white. Nebulizer machine is broken. Using his home oxygen flow pressure to drive his nebulizer medication, used for 5 times daily. Chronic prednisone 20 mg daily. He understands issues of osteoporosis, immunosuppression, adrenal insufficiency. CXR 08/11/13-  IMPRESSION:  No acute cardiopulmonary  disease. No change from the prior study.  11/16/13- 74 yo M former 3 PPD smoker with respiratory failure/ COPD/ steroid dependent, chronic AFib.   Wife here. FOLLOWS FOR: since having mobile scooter patient is able to get around more and states his breathing is doing well.  No recent infection.  Often some wheeze- took neb earlier. O2 2-3L/M/ [employer]. Maintenance prednisone 20 mg/ day-chronic.   03/23/14- 74 yo M former 3 PPD smoker with respiratory failure/ COPD/ steroid dependent, chronic AFib.   Wife here FOLLOWS FOR:  breathing has been fair per pt.  he stated that he is just not able to do anything.  He thinks he is gradually declining, which is what we would expect for his disease progression. No acute event. Occasional ankle edema has been better controlled.  O2 2-3L/M/ [employer]. Maintenance prednisone 20 mg/ day-chronic.   07/24/14- 74 yo M former 3 PPD smoker with respiratory failure/ COPD/ steroid dependent, chronic AFib.   Wife here FOLLOWS FOR: Not breathing as well as he would like. Continues O2 2-3L 24/7; DME is [employer]  11/22/14- 74 yo M former 3 PPD smoker with  respiratory failure/ COPD/ steroid dependent, chronic AFib.    FOLLOWS FOR: Continues to have SOB often; using O2 all the time O2 2-3L/M/ [employer]. Anoro refill script sent He feels his exercise tolerance is slowly declining. Does not blame pollen season. Morning cough with clear phlegm. Using nebulizer up to 3 times daily for modest benefit. No acute events.  Review of Systems- see HPI Constitutional:   No-   weight loss, night sweats, fevers, chills, fatigue, lassitude. HEENT:   No-  headaches, difficulty swallowing, tooth/dental problems, sore throat,       No-  sneezing, itching, ear ache, nasal congestion, post nasal drip,  CV:  No-   chest pain, orthopnea, PND, no recent swelling in lower extremities, no-anasarca, dizziness, +palpitations Resp: + shortness of breath with exertion or at rest.              +cough,  No-   coughing up of blood.              No-   change in color of mucus.  +Slight wheezing.   Skin: No-   rash or lesions. GI:  No-   heartburn, indigestion, abdominal pain, nausea, vomiting, GU:  MS:  No-   joint pain or swelling.   Neuro- nothing unusual  Psych:  No- change in mood or affect. No depression or anxiety.  No memory loss.  Objective:   Physical Exam General- Alert, Oriented, Affect-appropriate, Distress- none acute, +power chair, O2 2.5 l/m . Sat 98% Skin- rash-none, lesions- none, excoriation- none. Steroid fragility and echymoses Lymphadenopathy- none Head- atraumatic            Eyes- Gross vision intact, PERRLA, conjunctivae clear secretions            Ears- Hearing, canals normal            Nose- Clear, No-Septal dev, mucus, polyps, erosion, perforation             Throat- Mallampati II , mucosa clear , drainage- none, tonsils- atrophic Neck- flexible , trachea midline, no stridor , thyroid nl, carotid no bruit Chest - symmetrical excursion , unlabored           Heart/CV-+ IRR/ chronic AFib (no pacemaker) , no murmur , no gallop , no rub, nl s1 s2                            JVD- none , edema- trace, stasis changes- none, varices- none           Lung- +Very distant bilaterally, w/ diffuse slow expiratory wheeze,  Cough-none ,  dullness-none, rub-none,  +Pursed lips. Bilateral breath sounds.           Chest wall-  Abd- Br/ Gen/ Rectal- Not done, not indicated Extrem- +power wheelchair Neuro- grossly intact to observation

## 2014-11-24 ENCOUNTER — Telehealth: Payer: Self-pay | Admitting: Internal Medicine

## 2014-11-24 NOTE — Telephone Encounter (Signed)
Spoke with Malanya at [facility]- made her aware that the patient has already completed the augmentin and the amox was written on 3/30.  He is not taking 2 abx at one time.  Nothing further needed.

## 2014-11-26 NOTE — Assessment & Plan Note (Signed)
Chronic, ventricular response rate controlled. Managed by cardiology.

## 2014-11-26 NOTE — Assessment & Plan Note (Signed)
No dramatic change. Okay to set oxygen at 3 L for baseline. I agreed to give antibiotic to hold with discussion.CXR, Continue Anoro inhaler

## 2014-12-01 ENCOUNTER — Other Ambulatory Visit: Payer: Self-pay | Admitting: *Deleted

## 2014-12-01 MED ORDER — AMOXICILLIN 500 MG PO CAPS
500.0000 mg | ORAL_CAPSULE | Freq: Three times a day (TID) | ORAL | Status: DC
Start: 2014-12-01 — End: 2015-01-12

## 2014-12-01 NOTE — Progress Notes (Signed)
Amoxicillin prescription was submitted to patient's mail order.  [facility] is not able to fill rx under 30 day supply. Called patient and notified him that rx would be [facility]. Nothing further needed.

## 2015-01-01 ENCOUNTER — Other Ambulatory Visit: Payer: Self-pay | Admitting: Internal Medicine

## 2015-01-01 ENCOUNTER — Other Ambulatory Visit: Payer: Self-pay | Admitting: Family Medicine

## 2015-01-01 DIAGNOSIS — R410 Disorientation, unspecified: Secondary | ICD-10-CM

## 2015-01-02 ENCOUNTER — Other Ambulatory Visit: Payer: Self-pay | Admitting: Family Medicine

## 2015-01-02 DIAGNOSIS — R410 Disorientation, unspecified: Secondary | ICD-10-CM

## 2015-01-11 ENCOUNTER — Ambulatory Visit
Admission: RE | Admit: 2015-01-11 | Discharge: 2015-01-11 | Disposition: A | Discharge status: Home / Self Care | Payer: Medicare Other | Source: Ambulatory Visit | Attending: Family Medicine | Admitting: Family Medicine

## 2015-01-11 DIAGNOSIS — R410 Disorientation, unspecified: Secondary | ICD-10-CM

## 2015-01-11 NOTE — Progress Notes (Signed)
Cardiology Office Note   Date:  01/12/2015   ID:  Mark DakinGrady J [name], DOB 04/20/1941, MRN [id number]  PCP:  Kaleen MaskELKINS,WILSON OLIVER, MD  Cardiologist:  Dr. Sherryl MangesSteven Klein     Chief Complaint  Patient presents with  . Atrial Fibrillation     History of Present Illness: Mark [name] is a 74 y.o. male with a hx of with a hx of PAF, severe COPD, HL.  He is controlled on Propafenone.  He has refused anticoagulation in the past.  He last saw Dr. Sherryl MangesSteven Klein 6/15.    He is here today with his wife. He uses a motorized scooter. He is on chronic O2. He came in today for routine follow-up. However, about 3 weeks ago, he had symptoms of aphasia while at a restaurant. This lasted for a few minutes. Since that time he's had periods of confusion. He has a hard time using his phone or figuring out how to use the remote control. He denies any unilateral weakness.  He saw his primary care physician yesterday. An MRI was arranged. He has not yet heard the results. 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 I reviewed this with his primary care physician who had planned to send him to neurology. The patient is fairly sedentary. He has dyspnea with minimal activities which is chronic and stable. He is NYHA 3-3b. He denies orthopnea, PND or edema. He denies chest pain or syncope. He has occasional episodes of rapid heartbeats.   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 Acute and subacute RIGHT hemisphere ischemia as described. L ICA appears unremarkable.  Tiny focus of chronic hemorrhage RIGHT frontal subcortical white matter could be acute or chronic Atrophy with chronic microvascular ischemic change.  Event Monitor 5/15 PVCs/PACs  Echo 1/11 EF  55-60%, normal wall motion, trivial AI, atrial septal lipomatous hypertrophy, PASP 35   Past Medical History  Diagnosis Date  . Atrial fibrillation   . Hyperlipidemia   . Weakness   . Dyspnea   . Headache(784.0)   . Osteopenia   . COPD (chronic obstructive pulmonary disease)   . Allergic rhinitis     Past Surgical History  Procedure Laterality Date  . Catheter ablation    . Cataract extraction, bilateral    . Appendectomy    . Inguinal hernia repair    . Video assisted thoracoscopy (vats)/thorocotomy  08/17/2012    resection / stapling of blebs, mechanical pleurodesis   . Video assisted thoracoscopy  08/17/2012    Procedure: VIDEO ASSISTED THORACOSCOPY;  Surgeon: Kerin PernaPeter Van Trigt, MD;  [facility];  Service: Thoracic;  Laterality: Right;  . Resection of apical bleb  08/17/2012    Procedure: RESECTION OF APICAL BLEB;  Surgeon: Kerin PernaPeter Van Trigt, MD;  [facility];  Service: Thoracic;  Laterality: Right;  stapling of bleb  . Video bronchoscopy  08/27/2012    Procedure: VIDEO BRONCHOSCOPY;  Surgeon: Delight OvensEdward B Gerhardt, MD;  [facility];  Service: Thoracic;  Laterality: N/A;  evaluation of endobronchial valves     Current Outpatient Prescriptions  Medication Sig Dispense Refill  . albuterol (PROAIR HFA) 108 (90 BASE) MCG/ACT inhaler Inhale 2 puffs into the lungs every 4 (four) hours as needed for wheezing or shortness of breath. For shortness of breath/wheezing 1  Inhaler prn  . albuterol (PROVENTIL) (2.5 MG/3ML) 0.083% nebulizer solution USE ONE VIAL VIA NEBULIZER 4 TIMES DAILY 120 mL 4  . alendronate (FOSAMAX) 70 MG tablet Take 70 mg by mouth every 7 (seven) days. Takes on Tuesday Take with a full glass of water on an empty stomach.    . Calcium-Magnesium-Vitamin D (ONE-A-DAY CALCIUM PLUS) 500-50-100 MG-MG-UNIT CHEW Chew 1 tablet by mouth 2 (two) times daily.      Marland Kitchen. EPINEPHrine 0.3 mg/0.3 mL IJ SOAJ injection Inject 0.3 mLs (0.3 mg total) into the muscle once. For severe  shortness of breath 1 Device prn  . furosemide (LASIX) 20 MG tablet TAKE 1 TABLET (20 MG TOTAL) BY MOUTH EVERY OTHER DAY. 15 tablet 6  . guaiFENesin (MUCINEX) 600 MG 12 hr tablet Take 1-2 mg by mouth every 12 (twelve) hours as needed. For cough    . magnesium oxide (MAG-OX) 400 MG tablet Take 1 tablet (400 mg total) by mouth daily.    . Multiple Vitamins-Minerals (CENTRUM SILVER PO) Take 1 tablet by mouth daily.      . Nebulizers (COMPRESSOR/NEBULIZER) MISC Use as directed 1 each 0  . omeprazole (PRILOSEC) 20 MG capsule Take 1 capsule (20 mg total) by mouth 2 (two) times daily. 60 capsule 1  . predniSONE (DELTASONE) 20 MG tablet TAKE 1 TABLET (20 MG TOTAL) BY MOUTH DAILY. 30 tablet 5  . propafenone (RYTHMOL) 225 MG tablet TAKE ONE TABLET BY MOUTH TWICE A DAY 180 tablet 1  . Umeclidinium-Vilanterol (ANORO ELLIPTA) 62.5-25 MCG/INH AEPB Inhale 1 puff into the lungs daily. RINSE MOUTH WELL AFTER USE 3 each 3  . verapamil (CALAN-SR) 240 MG CR tablet TAKE 1 TABLET BY MOUTH EVERY DAY 30 tablet 6  . apixaban (ELIQUIS) 5 MG TABS tablet Take 1 tablet (5 mg total) by mouth 2 (two) times daily. 60 tablet 11   No current facility-administered medications for this visit.    Allergies:   Shrimp and Zolpidem tartrate    Social History:  The patient  reports that he has quit smoking. His smoking use included Cigarettes. He smoked 3.00 packs per day. He quit smokeless tobacco use about 49 years ago. He reports that he does not drink alcohol or use illicit drugs.   Family History:  The patient's family history includes COPD in his brother, father, and sister; Heart attack in his father; Hypertension in his brother; Lung cancer in his father; Stroke in his mother.    ROS:   Please see the history of present illness.   Review of Systems  Constitution: Positive for chills.  Cardiovascular: Positive for dyspnea on exertion.  Respiratory: Positive for wheezing.   Hematologic/Lymphatic: Bruises/bleeds easily.    Gastrointestinal: Positive for constipation.  Psychiatric/Behavioral: Positive for depression.  All other systems reviewed and are negative.    PHYSICAL EXAM: VS:  BP 110/61 mmHg  Pulse 76  Ht 5\' 11"  (1.803 m)  Wt 164 lb (74.39 kg)  BMI 22.88 kg/m2  SpO2 94%    Wt Readings from Last 3 Encounters:  01/12/15 164 lb (74.39 kg)  11/22/14 168 lb 6.4 oz (76.386 kg)  07/24/14 165 lb (74.844 kg)     GEN:  Chronically ill-appearing male sitting in a motorized scooter in no acute distress HEENT: normal Neck: no JVD at 90 , no masses Cardiac:  Normal S1/S2, RRR; no murmur ,  no rubs or gallops, no edema   Respiratory:  Decreased breath sounds bilaterally, no wheezing, rhonchi or rales. GI: soft,  nontender, nondistended, + BS MS: no deformity or atrophy Skin: warm and dry  Neuro:  CNs II-XII intact, no focal deficits noted. Psych: Normal affect   EKG:  EKG is ordered today.  It demonstrates:   NSR, HR 76, normal axis, low voltage, QTc 438 ms  Recent Labs: 02/21/2014: BUN 19; Creatinine 1.5; Hemoglobin 11.9*; Platelets 314.0; Potassium 4.2; Sodium 138; TSH 0.76    Lipid Panel No results found for: CHOL, TRIG, HDL, CHOLHDL, VLDL, LDLCALC, LDLDIRECT    ASSESSMENT AND PLAN:  CVA (cerebral vascular accident):  I reviewed the patient's case today with his primary care physician as well as Dr. [name]. Dr. [name] spoke to Dr. Pearlean Brownie. It was noted that the patient's aphasia would come from a left brain event. Findings on MRI are all right-sided. Because of the occluded right ICA, recommendation was to pursue anticoagulation. He will need further workup for his aphasia. I reviewed this all with the patient today as well as his wife. He understands the need for anticoagulation. An echocardiogram will be obtained. I will defer performing carotid Dopplers to the neurologist.  We will arrange an appointment with Neuro in the next couple of weeks.   Paroxysmal atrial fibrillation:  Maintaining NSR.  His CHADS2-VASc=3 (age > 53, stroke).  We will place him on anticoagulant Rx as outlined above.  His last Creatinine was 1.5.  He is > 60 kg and < 63 years old.  He is appropriate for Eliquis 5 mg.  We will stop his ASA with the start of Eliquis.  Obtain BMET, CBC.  Refer to the CVRR clinic as Eliquis is a new medication for him.  COPD with chronic bronchitis-oxygen dependent:  FU with Pulmonology as planned.    Current medicines are reviewed at length with the patient today.  Concerns regarding medicines are as outlined above.  The following changes have been made:    Stop ASA  Start Eliquis 5 mg Twice daily    Labs/ tests ordered today include:  Orders Placed This Encounter  Procedures  . Basic Metabolic Panel (BMET)  . CBC w/Diff  . Ambulatory referral to Neurology  . EKG 12-Lead  . Echocardiogram    Disposition:   FU with Dr. Sherryl Manges 2-3 mos.   Signed, Brynda Rim, MHS 01/12/2015 1:08 PM    [facility] [address] [place], [place], [place]  [address] Phone: [phone]; Fax: [phone]

## 2015-01-12 ENCOUNTER — Encounter: Payer: Self-pay | Admitting: Physician Assistant

## 2015-01-12 ENCOUNTER — Telehealth: Payer: Self-pay | Admitting: *Deleted

## 2015-01-12 ENCOUNTER — Ambulatory Visit (INDEPENDENT_AMBULATORY_CARE_PROVIDER_SITE_OTHER): Payer: Medicare Other | Admitting: Physician Assistant

## 2015-01-12 VITALS — BP 110/61 | HR 76 | Ht 71.0 in | Wt 164.0 lb

## 2015-01-12 DIAGNOSIS — I639 Cerebral infarction, unspecified: Secondary | ICD-10-CM

## 2015-01-12 DIAGNOSIS — I480 Paroxysmal atrial fibrillation: Secondary | ICD-10-CM

## 2015-01-12 DIAGNOSIS — E785 Hyperlipidemia, unspecified: Secondary | ICD-10-CM | POA: Diagnosis not present

## 2015-01-12 DIAGNOSIS — J449 Chronic obstructive pulmonary disease, unspecified: Secondary | ICD-10-CM | POA: Diagnosis not present

## 2015-01-12 LAB — BASIC METABOLIC PANEL
BUN: 18 mg/dL (ref 6–23)
CHLORIDE: 98 meq/L (ref 96–112)
CO2: 33 meq/L — AB (ref 19–32)
CREATININE: 0.84 mg/dL (ref 0.40–1.50)
Calcium: 9.2 mg/dL (ref 8.4–10.5)
GFR: 94.84 mL/min (ref >60.00)
Glucose, Bld: 105 mg/dL — ABNORMAL HIGH (ref 70–99)
Potassium: 3.9 mEq/L (ref 3.5–5.1)
Sodium: 138 mEq/L (ref 135–145)

## 2015-01-12 LAB — CBC WITH DIFFERENTIAL/PLATELET
Basophils Absolute: 0 10*3/uL (ref 0.0–0.1)
Basophils Relative: 0.3 % (ref 0.0–3.0)
EOS PCT: 1.1 % (ref 0.0–5.0)
Eosinophils Absolute: 0.2 10*3/uL (ref 0.0–0.7)
HCT: 41.3 % (ref 39.0–52.0)
Hemoglobin: 13.7 g/dL (ref 13.0–17.0)
Lymphocytes Relative: 11.7 % — ABNORMAL LOW (ref 12.0–46.0)
Lymphs Abs: 2.1 10*3/uL (ref 0.7–4.0)
MCHC: 33.2 g/dL (ref 30.0–36.0)
MCV: 91.2 fl (ref 78.0–100.0)
MONOS PCT: 4.3 % (ref 3.0–12.0)
Monocytes Absolute: 0.8 10*3/uL (ref 0.1–1.0)
NEUTROS PCT: 82.6 % — AB (ref 43.0–77.0)
Neutro Abs: 14.9 10*3/uL — ABNORMAL HIGH (ref 1.4–7.7)
Platelets: 269 10*3/uL (ref 150.0–400.0)
RBC: 4.52 Mil/uL (ref 4.22–5.81)
RDW: 15.8 % — ABNORMAL HIGH (ref 11.5–15.5)
WBC: 18.1 10*3/uL (ref 4.0–10.5)

## 2015-01-12 MED ORDER — APIXABAN 5 MG PO TABS: 5.0000 mg | ORAL_TABLET | Freq: Two times a day (BID) | ORAL | Status: DC

## 2015-01-12 NOTE — Telephone Encounter (Signed)
Wife notified of lab results. Pt advised WBC may be eleavated due to chronic steroid use for COPD. Pt denies c/o cough, dysuria, rash or fever. Pt advised to f/u w/PCP in regards to WBC elevated. Labs faxed to PCP today.

## 2015-01-12 NOTE — Telephone Encounter (Signed)
Called the patient to schedule a one month follow up appointment for Eliquis.  He was started on Eliquis today (01/12/15) and the scheduler scheduled him for today instead of 1 month from today. Left a callback number to call the Clinic to reschedule for 1 month.

## 2015-01-12 NOTE — Patient Instructions (Signed)
Medication Instructions:  1. STOP ASPRIN 2. START ELIQUIS 5 MG 1 TAB TWICE DAILY; MAKE SURE TO SPACE OUT EVERY 12 HOURS  Labwork: 1. TODAY; BMET, CBC W/DIFF  Testing/Procedures: Your physician has requested that you have an echocardiogram. Echocardiography is a painless test that uses sound waves to create images of your heart. It provides your doctor with information about the size and shape of your heart and how well your heart's chambers and valves are working. This procedure takes approximately one hour. There are no restrictions for this procedure.   Follow-Up: YOU WILL NEED TO FOLLOW UP WITH DR. Graciela HusbandsKLEIN IN 2-3 MONTHS  Any Other Special Instructions Will Be Listed Below (If Applicable). 1. YOU ARE BEING REFERRED TO DR. Pearlean BrownieSETHI ; NEUROLOGY;   2. YOU WILL NEED TO SEE OUR ANTICOAGULATION CLINIC IN 4 WEEKS ; NEW START ON ELQUIS

## 2015-01-18 ENCOUNTER — Other Ambulatory Visit: Payer: Self-pay

## 2015-01-18 ENCOUNTER — Ambulatory Visit (HOSPITAL_COMMUNITY): Payer: Medicare Other | Attending: Internal Medicine

## 2015-01-18 DIAGNOSIS — I639 Cerebral infarction, unspecified: Secondary | ICD-10-CM | POA: Insufficient documentation

## 2015-01-18 DIAGNOSIS — I4891 Unspecified atrial fibrillation: Secondary | ICD-10-CM

## 2015-01-19 ENCOUNTER — Encounter: Payer: Self-pay | Admitting: Physician Assistant

## 2015-01-19 ENCOUNTER — Telehealth: Payer: Self-pay | Admitting: *Deleted

## 2015-01-19 NOTE — Telephone Encounter (Signed)
S/w pt's wife Maria AntoniaLorena, [place]. Wife notified of echo results by phone with verbal undersatnding.

## 2015-01-23 ENCOUNTER — Ambulatory Visit (INDEPENDENT_AMBULATORY_CARE_PROVIDER_SITE_OTHER): Payer: Medicare Other | Admitting: Neurology

## 2015-01-23 ENCOUNTER — Telehealth: Payer: Self-pay | Admitting: *Deleted

## 2015-01-23 ENCOUNTER — Encounter: Payer: Self-pay | Admitting: Neurology

## 2015-01-23 VITALS — BP 117/70 | HR 81 | Ht 73.0 in | Wt 170.0 lb

## 2015-01-23 DIAGNOSIS — I480 Paroxysmal atrial fibrillation: Secondary | ICD-10-CM | POA: Diagnosis not present

## 2015-01-23 DIAGNOSIS — Z8673 Personal history of transient ischemic attack (TIA), and cerebral infarction without residual deficits: Secondary | ICD-10-CM | POA: Insufficient documentation

## 2015-01-23 DIAGNOSIS — I639 Cerebral infarction, unspecified: Secondary | ICD-10-CM | POA: Diagnosis not present

## 2015-01-23 DIAGNOSIS — R739 Hyperglycemia, unspecified: Secondary | ICD-10-CM

## 2015-01-23 DIAGNOSIS — I63411 Cerebral infarction due to embolism of right middle cerebral artery: Secondary | ICD-10-CM | POA: Diagnosis not present

## 2015-01-23 NOTE — Telephone Encounter (Signed)
Left vm re: medication to take prior to upcoming MRI is available at front desk for pick up, must bring photo ID to pick up, instructions on packet. Also informed patient that Carollee HerterShannon will call with date/time of MRI. Left this caller's name, number for any questions.

## 2015-01-23 NOTE — Patient Instructions (Signed)
-   continue eliquis for stroke prevention - check A1C level, bad cholesterol for stroke work up - check MRI of the brain vessels to look for the brain vasculature - Follow up with your primary care physician for stroke risk factor modification. Recommend maintain blood pressure goal <130/80, diabetes with hemoglobin A1c goal below 6.5% and lipids with LDL cholesterol goal below 70 mg/dL.  - follow up with cardiology for afib - gradually increase the physical activity - No restriction for driving, but recommend to drive with family members on board for 2-3 times initially. If both parties feel comfortable of your driving, you can drive alone after. However, you are recommended to drive during the day not at night, no long distance and drive in familiar roads. - follow up in 2 months.

## 2015-01-23 NOTE — Progress Notes (Signed)
NEUROLOGY CLINIC NEW PATIENT NOTE  NAME: Mark [name] DOB: 08-10-1941 REFERRING PHYSICIAN: [name], *  I saw Mark [name] as a new consult in the neurovascular clinic today regarding  Chief Complaint  Patient presents with  . Cerebrovascular Accident    new patient, rm 2, wife  .  HPI: Mark [name] is a 74 y.o. male with PMH of PAF not on AC, severe COPD with O2 dependent who presents as a new patient for a stroke.   He was accompanied by wife. They stated that about one month ago, he was in a restaurant and had sudden onset word finding difficulties for 3-4 min and resolved. However, after that, wife found him to have intermittent confusion with remote control, dial phone numbers, slow in response to questions and mess up with date or time. Denies any weakness, numbness, LOC, dizziness, swallowing difficulty or seizure. He went to see PCP 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 He was seen by cardiology on 01/12/15 and had 2D ehco which was unremarkable. He was put on eliquis. He was referred here for further evaluation.  He was found to have PAF in 2012 when he was admitted for pneumonia, and was put on coumadin briefly. However, he developed bruise at his arms and he optioned to be off AC. He was on ASA since then. He also had aflutter and s/p ablation. He was told that his aflutter was cured but still has afib.  He still has severe COPD and needs home O2, he is following with [name] and on steroids.   He is a former smoker, quit many years ago. Occasionally drink and denies illicit drugs.  Past Medical History  Diagnosis Date  . Atrial fibrillation   . Hyperlipidemia   . Weakness   . Dyspnea   . Headache(784.0)   . Osteopenia   . COPD (chronic obstructive pulmonary disease)   . Allergic rhinitis   . History of echocardiogram     Echo 5/16:  EF 55-60%, no RWMA, Gr 1 DD  . Incontinence of  urine     and stool  . Memory loss   . Confusion   . Numbness     bilateral forearms   Past Surgical History  Procedure Laterality Date  . Catheter ablation    . Cataract extraction, bilateral    . Appendectomy  1958  . Inguinal hernia repair  1975, 1992  . Video assisted thoracoscopy (vats)/thorocotomy  08/17/2012    resection / stapling of blebs, mechanical pleurodesis   . Video assisted thoracoscopy  08/17/2012    Procedure: VIDEO ASSISTED THORACOSCOPY;  Surgeon: [name], MD;  Location: [facility] OR;  Service: Thoracic;  Laterality: Right;  . Resection of apical bleb  08/17/2012    Procedure: RESECTION OF APICAL BLEB;  Surgeon: [name], MD;  Location: [facility] OR;  Service: Thoracic;  Laterality: Right;  stapling of bleb  . Video bronchoscopy  08/27/2012    Procedure: VIDEO BRONCHOSCOPY;  Surgeon: [name], MD;  Location: [facility] OR;  Service: Thoracic;  Laterality: N/A;  evaluation of endobronchial valves   Family History  Problem Relation Age of Onset  . Heart attack Father   . COPD Father   . Lung cancer Father   . COPD Brother     deceased  . COPD Sister   . Stroke Mother   . Hypertension Brother  Current Outpatient Prescriptions  Medication Sig Dispense Refill  . albuterol (PROAIR HFA) 108 (90 BASE) MCG/ACT inhaler Inhale 2 puffs into the lungs every 4 (four) hours as needed for wheezing or shortness of breath. For shortness of breath/wheezing 1 Inhaler prn  . albuterol (PROVENTIL) (2.5 MG/3ML) 0.083% nebulizer solution USE ONE VIAL VIA NEBULIZER 4 TIMES DAILY 120 mL 4  . alendronate (FOSAMAX) 70 MG tablet Take 70 mg by mouth every 7 (seven) days. Takes on Tuesday Take with a full glass of water on an empty stomach.    Marland Kitchen apixaban (ELIQUIS) 5 MG TABS tablet Take 1 tablet (5 mg total) by mouth 2 (two) times daily. 60 tablet 11  . Calcium-Magnesium-Vitamin D (ONE-A-DAY CALCIUM PLUS) 500-50-100 MG-MG-UNIT CHEW Chew 1 tablet by mouth 2 (two) times daily.      Marland Kitchen  EPINEPHrine 0.3 mg/0.3 mL IJ SOAJ injection Inject 0.3 mLs (0.3 mg total) into the muscle once. For severe shortness of breath 1 Device prn  . furosemide (LASIX) 20 MG tablet TAKE 1 TABLET (20 MG TOTAL) BY MOUTH EVERY OTHER DAY. 15 tablet 6  . guaiFENesin (MUCINEX) 600 MG 12 hr tablet Take 1-2 mg by mouth every 12 (twelve) hours as needed. For cough    . magnesium oxide (MAG-OX) 400 MG tablet Take 1 tablet (400 mg total) by mouth daily.    . Multiple Vitamins-Minerals (CENTRUM SILVER PO) Take 1 tablet by mouth daily.      . Nebulizers (COMPRESSOR/NEBULIZER) MISC Use as directed 1 each 0  . omeprazole (PRILOSEC) 20 MG capsule Take 1 capsule (20 mg total) by mouth 2 (two) times daily. 60 capsule 1  . predniSONE (DELTASONE) 20 MG tablet TAKE 1 TABLET (20 MG TOTAL) BY MOUTH DAILY. 30 tablet 5  . propafenone (RYTHMOL) 225 MG tablet TAKE ONE TABLET BY MOUTH TWICE A DAY 180 tablet 1  . Umeclidinium-Vilanterol (ANORO ELLIPTA) 62.5-25 MCG/INH AEPB Inhale 1 puff into the lungs daily. RINSE MOUTH WELL AFTER USE 3 each 3  . verapamil (CALAN-SR) 240 MG CR tablet TAKE 1 TABLET BY MOUTH EVERY DAY 30 tablet 6   No current facility-administered medications for this visit.   Allergies  Allergen Reactions  . Shrimp [Shellfish Allergy]   . Zolpidem Tartrate     disoriented   History   Social History  . Marital Status: Married    Spouse Name: Windy Kalata  . Number of Children: 6  . Years of Education: 8   Occupational History  . Retired     Air traffic controller   Social History Main Topics  . Smoking status: Former Smoker -- 3.00 packs/day    Types: Cigarettes  . Smokeless tobacco: Former Neurosurgeon    Quit date: 09/01/1965  . Alcohol Use: No  . Drug Use: No  . Sexual Activity: Not on file   Other Topics Concern  . Not on file   Social History Narrative   Married, 6 children   Right handed   Caffeine use - soda 2 daily    Review of Systems Full 14 system review of systems performed  and notable only for those listed, all others are neg:  Constitutional:  Fever/chills, fatigue Cardiovascular:  Ear/Nose/Throat:   Skin:  Eyes:  Blurry vision Respiratory:  SOB, wheezing Gastroitestinal:  Incontinence Genitourinary: Incontinence, impotence Hematology/Lymphatic:  Easy bruising Endocrine: Feeling cold, increased thirst Musculoskeletal:  Cramps Allergy/Immunology:  Runny nose, frequent infections Neurological:  Memory loss, confusion, numbness, difficulty swallowing, tremor Psychiatric: Decreased energy Sleep:  Physical Exam  Filed Vitals:   01/23/15 0917  BP: 117/70  Pulse: 81    General - Well nourished, well developed, on portable O2, no acute distress.  Ophthalmologic - phone and not visualized due to eye movement.  Cardiovascular - Regular rate and rhythm, no afib rhythm.   Mental Status -  Level of arousal and orientation to time, place, and person were intact. Language including expression, naming, repetition, comprehension, reading, and writing was assessed and found intact. Attention span and concentration were normal. Recent and remote memory were intact on registration 3/3, delayed recall 2/3. Fund of Knowledge was assessed and was intact.  Cranial Nerves II - XII - II - Visual field intact OU. III, IV, VI - Extraocular movements intact. V - Facial sensation intact bilaterally. VII - Facial movement intact bilaterally. VIII - Hearing & vestibular intact bilaterally. X - Palate elevates symmetrically. XI - Chin turning & shoulder shrug intact bilaterally. XII - Tongue protrusion intact.  Motor Strength - The patient's strength was normal in all extremities and pronator drift was absent.  Bulk was normal and fasciculations were absent.   Motor Tone - Muscle tone was assessed at the neck and appendages and was normal.  Reflexes - The patient's reflexes were symmetrical in all extremities and he had no pathological reflexes.  Sensory - Light  touch, temperature/pinprick were assessed and were normal.    Coordination - The patient had normal movements in the hands with no ataxia or dysmetria.  Tremor was absent.  Gait and Station - not tested and pt in wheelchair.   Imaging  I have personally reviewed the radiological images below and agree with the radiology interpretations.  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 Acute and subacute RIGHT hemisphere ischemia as described. Tiny focus of chronic hemorrhage RIGHT frontal subcortical white matter could be acute or chronic Atrophy with chronic microvascular ischemic change.  2D echo -01/18/15  - Left ventricle: The cavity size was normal. Wall thickness was normal. Systolic function was normal. The estimated ejection fraction was in the range of 55% to 60%. Wall motion was normal; there were no regional wall motion abnormalities. Doppler parameters are consistent with abnormal left ventricular relaxation (grade 1 diastolic dysfunction). The E/e&' ratio is between 8-15, suggesting indeterminate LV filling pressure. - Left atrium: The atrium was normal in size Impressions: - Compared to the prior echo in 2011, there has been no change in LVEF.  Lab Review none   Assessment and Plan:   In summary, Mark [name] is a 74 y.o. male with PMH of paroxysmal A. fib not on AC, severe COPD requiring home oxygen presents  as a new patient for right MCA infarct. Etiology consistent with embolic pattern secondary to A. fib not on anticoagulation. 2-D echo normal EF. He was put on eliquis for stroke prevention. There is concern for right ICA occlusion on MRI, will need further workup with vessel imaging, MRA head and neck. Will also need a stroke labs.   - continue eliquis for stroke prevention - check A1C, LDL and TSH for stroke work up - check MRA head and neck - Follow up with your primary care physician for stroke risk factor modification.  Recommend maintain blood pressure goal <130/80, diabetes with hemoglobin A1c goal below 6.5% and lipids with LDL cholesterol goal below 70 mg/dL.  - RTC in 2 months.  I recommend aggressive blood pressure control with a goal <130/80 mm Hg.  Lipids should  be managed intensively, with a goal LDL < 70 mg/dL.  I encouraged the patient to discuss these important issues with his primary care physician.  I counseled the patient on measures to reduce stroke risk, including the importance of medication compliance, risk factor control, exercise, healthy diet, and avoidance of smoking.  I reviewed stroke warning signs and symptoms and appropriate actions to take if such occurs.   Thank you very much for the opportunity to participate in the care of this patient.  Please do not hesitate to call if any questions or concerns arise.  Orders Placed This Encounter  Procedures  . MR MRA HEAD WO CONTRAST    Standing Status: Future     Number of Occurrences:      Standing Expiration Date: 03/26/2016    Order Specific Question:  Reason for Exam (SYMPTOM  OR DIAGNOSIS REQUIRED)    Answer:  stroke    Order Specific Question:  Preferred imaging location?    Answer:  Internal    Order Specific Question:  Does the patient have a pacemaker or implanted devices?    Answer:  No    Order Specific Question:  What is the patient's sedation requirement?    Answer:  No Sedation  . MR Angiogram Neck W Wo Contrast    Wt 170/claus/pt has oxygen-will bring/no hx of neck or head sx/no hx of heart,eye or ear sx/yes metal in eyes-not since orbits in epic 01-11-15/no bullets or shrapnel/no stents or hx of ca/no penile implants/no kidney or liver/no lupus or ra/no nsaids/nkda to iv dye/no hx of htn or diab/mcr and bcbs/plm and pt with epic order called in by dawn    Standing Status: Future     Number of Occurrences:      Standing Expiration Date: 03/24/2016    Order Specific Question:  Reason for Exam (SYMPTOM  OR DIAGNOSIS REQUIRED)     Answer:  stroke    Order Specific Question:  Preferred imaging location?    Answer:  Internal    Order Specific Question:  Does the patient have a pacemaker or implanted devices?    Answer:  No    Order Specific Question:  What is the patient's sedation requirement?    Answer:  No Sedation  . TSH + free T4  . Lipid panel  . Hemoglobin A1c    No orders of the defined types were placed in this encounter.    Patient Instructions  - continue eliquis for stroke prevention - check A1C level, bad cholesterol for stroke work up - check MRI of the brain vessels to look for the brain vasculature - Follow up with your primary care physician for stroke risk factor modification. Recommend maintain blood pressure goal <130/80, diabetes with hemoglobin A1c goal below 6.5% and lipids with LDL cholesterol goal below 70 mg/dL.  - follow up with cardiology for afib - gradually increase the physical activity - No restriction for driving, but recommend to drive with family members on board for 2-3 times initially. If both parties feel comfortable of your driving, you can drive alone after. However, you are recommended to drive during the day not at night, no long distance and drive in familiar roads. - follow up in 2 months.   Marvel PlanJindong Tallulah Hosman, MD PhD [facility] [address], [address] [place], [place] [address] [phone]

## 2015-01-24 ENCOUNTER — Other Ambulatory Visit: Payer: Self-pay | Admitting: Internal Medicine

## 2015-01-24 LAB — LIPID PANEL
CHOL/HDL RATIO: 4.6 ratio (ref 0.0–5.0)
CHOLESTEROL TOTAL: 245 mg/dL — AB (ref 100–199)
HDL: 53 mg/dL (ref >39)
LDL CALC: 145 mg/dL — AB (ref 0–99)
Triglycerides: 233 mg/dL — ABNORMAL HIGH (ref 0–149)
VLDL Cholesterol Cal: 47 mg/dL — ABNORMAL HIGH (ref 5–40)

## 2015-01-24 LAB — TSH+FREE T4
Free T4: 1.2 ng/dL (ref 0.82–1.77)
TSH: 1.96 u[IU]/mL (ref 0.450–4.500)

## 2015-01-24 LAB — HEMOGLOBIN A1C
ESTIMATED AVERAGE GLUCOSE: 140 mg/dL
Hgb A1c MFr Bld: 6.5 % — ABNORMAL HIGH (ref 4.8–5.6)

## 2015-01-25 ENCOUNTER — Other Ambulatory Visit: Payer: Self-pay | Admitting: Neurology

## 2015-01-25 DIAGNOSIS — E785 Hyperlipidemia, unspecified: Secondary | ICD-10-CM

## 2015-01-25 MED ORDER — ATORVASTATIN CALCIUM 20 MG PO TABS: 20.0000 mg | ORAL_TABLET | Freq: Every day | ORAL | Status: DC

## 2015-01-31 ENCOUNTER — Telehealth: Payer: Self-pay | Admitting: Internal Medicine

## 2015-01-31 DIAGNOSIS — J449 Chronic obstructive pulmonary disease, unspecified: Secondary | ICD-10-CM

## 2015-01-31 NOTE — Telephone Encounter (Signed)
atc pt, line was answered and several buttons were pushed, and line was ended.  atc pt back, line busy.  Wcb.

## 2015-02-01 NOTE — Telephone Encounter (Signed)
Called and spoke to pt. Pt stated he needs a home concentrator. When asking the pt questions regarding his O2 (difficult to understand pt)--line was disconnected.  Called pt back and left message to return call.

## 2015-02-02 NOTE — Telephone Encounter (Signed)
We need to clarify if he is asking for a Home O2 concentrator instead of tanks, or a portable O2 concentrator.

## 2015-02-02 NOTE — Telephone Encounter (Signed)
Patient would like to have a home concentrator.  Patient uses 3L all the time.

## 2015-02-02 NOTE — Telephone Encounter (Signed)
Called and spoke to pt. Pt stated he was never given a home concentrator. Pt currently has three 100 pound tanks that will last about a week before they need replaced. Pt also has a few smaller more portable tanks he travels with. Pt is requesting a home concentrator just for home use as it is cumbersome with the large tanks and the DME coming out to replace them.   CY please advise. Thanks.

## 2015-02-02 NOTE — Telephone Encounter (Signed)
Thank you. Ok to order his DME evaluate for home O2 concentrator 3L/ min for his dx COPD mixed type, chronic hypoxic respiratory failure

## 2015-02-02 NOTE — Telephone Encounter (Signed)
Order entered for Home Concentrator. Nothing further needed.

## 2015-02-07 ENCOUNTER — Ambulatory Visit
Admission: RE | Admit: 2015-02-07 | Discharge: 2015-02-07 | Disposition: A | Discharge status: Home / Self Care | Payer: Medicare Other | Source: Ambulatory Visit | Attending: Neurology | Admitting: Neurology

## 2015-02-07 DIAGNOSIS — I639 Cerebral infarction, unspecified: Secondary | ICD-10-CM

## 2015-02-07 MED ORDER — GADOBENATE DIMEGLUMINE 529 MG/ML IV SOLN
15.0000 mL | Freq: Once | INTRAVENOUS | Status: AC | PRN
  Administered 2015-02-07: 15 mL via INTRAVENOUS

## 2015-02-12 ENCOUNTER — Ambulatory Visit (INDEPENDENT_AMBULATORY_CARE_PROVIDER_SITE_OTHER): Payer: Medicare Other | Admitting: *Deleted

## 2015-02-12 DIAGNOSIS — I480 Paroxysmal atrial fibrillation: Secondary | ICD-10-CM | POA: Diagnosis not present

## 2015-02-12 LAB — CBC
HCT: 41.1 % (ref 39.0–52.0)
HEMOGLOBIN: 13.3 g/dL (ref 13.0–17.0)
MCHC: 32.3 g/dL (ref 30.0–36.0)
MCV: 94.8 fl (ref 78.0–100.0)
Platelets: 232 10*3/uL (ref 150.0–400.0)
RBC: 4.34 Mil/uL (ref 4.22–5.81)
RDW: 16.2 % — ABNORMAL HIGH (ref 11.5–15.5)
WBC: 14.9 10*3/uL — ABNORMAL HIGH (ref 4.0–10.5)

## 2015-02-12 LAB — BASIC METABOLIC PANEL
BUN: 19 mg/dL (ref 6–23)
CALCIUM: 9.8 mg/dL (ref 8.4–10.5)
CO2: 35 meq/L — AB (ref 19–32)
Chloride: 100 mEq/L (ref 96–112)
Creatinine, Ser: 0.93 mg/dL (ref 0.40–1.50)
GFR: 84.31 mL/min (ref >60.00)
Glucose, Bld: 92 mg/dL (ref 70–99)
Potassium: 3.6 mEq/L (ref 3.5–5.1)
Sodium: 139 mEq/L (ref 135–145)

## 2015-02-12 NOTE — Progress Notes (Signed)
Pt was started on Eliquis 5mg s BID for Afib on 01/12/15.    Reviewed patients medication list.  Pt is not  currently on any combined P-gp and strong CYP3A4 inhibitors/inducers (ketoconazole, traconazole, ritonavir, carbamazepine, phenytoin, rifampin, St. John's wort).  Reviewed labs.  SCr 0.93,  Weight  78 Kg, Age 74 yrs old.  Dose appropriate  based on specified criteria, will continue Eliquis 5mg s BID.   Hgb and HCT 13.3/41.1.   A full discussion of the nature of anticoagulants has been carried out.  A benefit/risk analysis has been presented to the patient, so that they understand the justification for choosing anticoagulation with Eliquis at this time.  The need for compliance is stressed.  Pt is aware to take the medication twice daily.  Side effects of potential bleeding are discussed, including unusual colored urine or stools, coughing up blood or coffee ground emesis, nose bleeds or serious fall or head trauma.  Discussed signs and symptoms of stroke. The patient should avoid any OTC items containing aspirin or ibuprofen.  Avoid alcohol consumption.   Call if any signs of abnormal bleeding.  Discussed financial obligations and resolved any difficulty in obtaining medication.  Follow up with MD as directed.

## 2015-02-13 ENCOUNTER — Telehealth: Payer: Self-pay | Admitting: *Deleted

## 2015-02-13 NOTE — Telephone Encounter (Signed)
DPR on file to s/w wife. Wife notified of lab results reviewed by Dr. Excell Seltzer. Wife verbalized understanding to results given today by phone. Wife asked me about the MRI that was done. I explained that I cannot give results since we did not order MRI and that she will have to call the office that did order the MRI. Wife said ok and thank you.

## 2015-02-15 ENCOUNTER — Telehealth: Payer: Self-pay | Admitting: Neurology

## 2015-02-15 NOTE — Telephone Encounter (Signed)
Patient's wife(Lorena) called requesting MRI results. Please call and advise. Patient can be reached at [phone].

## 2015-02-15 NOTE — Telephone Encounter (Signed)
Dr Pearlean Brownie,  Dr Roda Shutters ordered MRI, MRA done on 02/07/15. Results are in, abnormal. Please call results to wife, Mark [name] [phone] or let me know what to tell her.  Thank you, Ambrose Pancoast

## 2015-02-19 NOTE — Telephone Encounter (Signed)
i spoke to pt and wife and gave results and answered questions. Advised continue present treatment plan

## 2015-03-26 ENCOUNTER — Encounter: Payer: Self-pay | Admitting: Internal Medicine

## 2015-03-26 ENCOUNTER — Ambulatory Visit (INDEPENDENT_AMBULATORY_CARE_PROVIDER_SITE_OTHER): Payer: Medicare Other | Admitting: Internal Medicine

## 2015-03-26 VITALS — BP 126/70 | HR 89 | Ht 71.0 in | Wt 169.4 lb

## 2015-03-26 DIAGNOSIS — J9383 Other pneumothorax: Secondary | ICD-10-CM | POA: Diagnosis not present

## 2015-03-26 DIAGNOSIS — I639 Cerebral infarction, unspecified: Secondary | ICD-10-CM | POA: Diagnosis not present

## 2015-03-26 DIAGNOSIS — J449 Chronic obstructive pulmonary disease, unspecified: Secondary | ICD-10-CM

## 2015-03-26 NOTE — Progress Notes (Signed)
Patient ID: RAYON MCCHRISTIAN, male    DOB: 1941-05-14, 74 y.o.   MRN: [id number]  HPI 01/17/11- 71 yo with respiratory failure/ COPD, chronic AFib. Wife here Last here 11/04/10- Note reviewed Got a cold 2 weeks ago. Initally better. In last week-10 days has had more malaise, cough esp in last 3 days. Last antibiotic 2-3 months ago.  Went back up to 20 mg daily prednisone. Was never able to wean off last year when we tried. He has been on steroids many years..   05/07/11-  48 yo M former 3 PPD smoker with respiratory failure/ COPD, chronic AFib. Wife here No major changes. Uses nebulizer 3-5x/ day, infrequent use of rescue inhaler.Stays on prednisone 20 mg daily. He couldn't function at 15 mg daily.No recent infection. Fought off a mild cold a few weeks ago. He used his standby doxy for that and feels it helped. Daily mucinex. Daily cough with some phlegm. For flu vax today. Oxygen continuous- 1.5 - 2 L/M.  In past few weeks has noted pain down from right hip to ankle- discussed as likely pinched nerve/ compression fx from steroids. Spends much of each day sitting.   11/05/11-70 yo M former 3 PPD smoker with respiratory failure/ COPD/ steroid dependent, chronic AFib. Wife here. PCP Dr [name] Had chest cold at [facility] but finally better in past month. Remains on prednisone 20 mg daily. He has been able to be much more active, feeling better. Up and around more in house on O2 2 L/M with less need for wheelchair and with no acute issues. Denies infection, blood, chest pain. Had injections for back pain.   05/07/12- 71 yo M former 3 PPD smoker with respiratory failure/ COPD/ steroid dependent, chronic AFib.   Wife here.  PCP Dr [name] Denies any SOB, wheezing, cough, or congestion for about the past 2 weeks Remains on oxygen 3 L/[employer].  They plan to fly to [facility] for a family wedding. I filled out the airplane form. They will be taking a portable concentrator. We discussed oxygen, altitude and  exertion. He remains steroid dependent 20 mg prednisone daily.  08/10/12- 40 yo M former 3 PPD smoker with respiratory failure/ COPD/ steroid dependent, chronic AFib.   Wife here.  PCP Dr [name] ACUTE VISIT: since Sunday having a hard time keep in O2 levels up even with O2; no energy and unable to do much activites. Came back in wheelchair and O2 was 86% on 3l/m cont. Had had a cold and started doxycycline yesterday. Denies fever,  chest pain, palpitation, little cough, no blood, no edema. Some pain across upper back and shoulders.  09/28/12- 12 yo M former 3 PPD smoker with respiratory failure/ COPD/ steroid dependent, chronic AFib.   Wife here.  Post Hospital: No more than usual SOB or wheezing since being in hospital prolonged stay 12/17-1/7 for prolonged R spontaneous PTX with chest tube.  Chest tube is still in with a drain, followed by Dr Donata Clay. Breathing is better. Antibiotics are finished. Continues oxygen 2 L. CT chest 08/26/12-  IMPRESSION:  1. Moderate right hydropneumothorax with right chest tube in  place, terminating in the anterior mediastinal region. Subcutaneous  emphysema along the right chest wall.  2. Postoperative changes in the right hemithorax with associated  soft tissue in the upper right hemithorax.  3. Centrilobular emphysema with possible superimposed airspace  disease in the right lower lobe. The latter is of unknown  chronicity and appears somewhat fibrotic.  Original Report Authenticated By: Leanna BattlesMelinda Blietz, M.D. CXR 09/29/12 IMPRESSION:  1. Stable right-sided chest tube with persistent right-sided  pneumothorax.  2. Persistent right midlung density.  3. Stable severe underlying emphysematous changes.  Original Report Authenticated By: Rudie MeyerP. Gallerani, M.D.  11/09/12- 74 yo M former 3 PPD smoker with respiratory failure/ COPD/ steroid dependent, chronic AFib.   Wife here.  FOLLOWS FOR: has had a hard time since last OV; was recently in hospital, admitted through  ED on 10/30/2012 when chest tube clotted. Fluid culture from tube was negative. He was treated for sepsis. Still feels weak/short of breath but pain is controlled. Little cough now and no fever or sweat CXR 11/03/12 IMPRESSION:  Stable position of the right chest tube and stable small right  pneumothorax.  Stable postsurgical changes in the right lung.  Original Report Authenticated By: Richarda OverlieAdam Henn, M.D.  6/181/473-  72 yo M former 3 PPD smoker with respiratory failure/ COPD/ steroid dependent, chronic AFib.   Wife here.  FOLLOWS FOR: continues to have SOB and gives out sooner Right chest tube has finally been removed and he says he is doing better O2 2-3 L/[employer]. Unable to wean prednisone below 20 mg daily CXR 12/22/12 IMPRESSION:  Interval removal of right chest tube. No evidence for residual  right-sided pneumothorax.  Postsurgical change in the right midlung with scarring noted at the  right base.  Original Report Authenticated By: Kennith CenterEric Mansell, M.D.  06/16/13-  74 yo M former 3 PPD smoker with respiratory failure/ COPD/ steroid dependent, chronic AFib.   Wife here.  follows for-4 month rtn.  Pt c/o SOB constantly, yellow mucous brought out by clearing throat.  Some sinus congestion. Needs nebulizer machine/[employer]. Continues prednisone 20 mg daily long-term. Oxygen 2 L continuous. Had flu shot  08/11/13-72 yo M former 3 PPD smoker with respiratory failure/ COPD/ steroid dependent, chronic AFib.   Wife here.  Pt c/o increased SOB, fatigue, chest pain, productive cough with yellow phlegm  x 4 days.  Pt is currently taking zpak on day 3. Had caught a cold one week ago. Feels weak, coughing yellow/white. Nebulizer machine is broken. Using his home oxygen flow pressure to drive his nebulizer medication, used for 5 times daily. Chronic prednisone 20 mg daily. He understands issues of osteoporosis, immunosuppression, adrenal insufficiency. CXR 08/11/13-  IMPRESSION:  No acute cardiopulmonary  disease. No change from the prior study.  11/16/13- 74 yo M former 3 PPD smoker with respiratory failure/ COPD/ steroid dependent, chronic AFib.   Wife here. FOLLOWS FOR: since having mobile scooter patient is able to get around more and states his breathing is doing well.  No recent infection.  Often some wheeze- took neb earlier. O2 2-3L/M/ [employer]. Maintenance prednisone 20 mg/ day-chronic.   03/23/14- 74 yo M former 3 PPD smoker with respiratory failure/ COPD/ steroid dependent, chronic AFib.   Wife here FOLLOWS FOR:  breathing has been fair per pt.  he stated that he is just not able to do anything.  He thinks he is gradually declining, which is what we would expect for his disease progression. No acute event. Occasional ankle edema has been better controlled.  O2 2-3L/M/ [employer]. Maintenance prednisone 20 mg/ day-chronic.   07/24/14- 74 yo M former 3 PPD smoker with respiratory failure/ COPD/ steroid dependent, chronic AFib.   Wife here FOLLOWS FOR: Not breathing as well as he would like. Continues O2 2-3L 24/7; DME is [employer]  11/22/14- 74 yo M former 3 PPD smoker with  respiratory failure/ COPD/ steroid dependent, chronic AFib.    FOLLOWS FOR: Continues to have SOB often; using O2 all the time O2 2-3L/M/ [employer]. Anoro refill script sent He feels his exercise tolerance is slowly declining. Does not blame pollen season. Morning cough with clear phlegm. Using nebulizer up to 3 times daily for modest benefit. No acute events.  03/26/15- 36 yo M former 3 PPD smoker with respiratory failure/ COPD/ steroid dependent, complicated by chronic AFib, CVA FOLLOWS FOR: Pt states he can get air in enough but does not feel like he is processing the air like he should. Continues oxygen 3 L continuous/[employer]. Little cough or wheeze. We discussed residual scarring after pneumothorax, chest tube, VATS in 2014 CXR 11/21/24 IMPRESSION: Stable chronic change. No active process. Electronically Signed  By: Dwyane Dee M.D.  On: 11/22/2014 10:14  Review of Systems- see HPI Constitutional:   No-   weight loss, night sweats, fevers, chills, fatigue, lassitude. HEENT:   No-  headaches, difficulty swallowing, tooth/dental problems, sore throat,       No-  sneezing, itching, ear ache, nasal congestion, post nasal drip,  CV:  No-   chest pain, orthopnea, PND, no recent swelling in lower extremities, no-anasarca, dizziness, +palpitations Resp: + shortness of breath with exertion or at rest.              +cough,  No-  coughing up of blood.              No-   change in color of mucus.  +Slight wheezing.   Skin: No-   rash or lesions. GI:  No-   heartburn, indigestion, abdominal pain, nausea, vomiting, GU:  MS:  No-   joint pain or swelling.   Neuro- nothing unusual  Psych:  No- change in mood or affect. No depression or anxiety.  No memory loss.  Objective:   Physical Exam General- Alert, Oriented, Affect-appropriate, Distress- none acute, +power chair, O2 3 L Skin- rash-none, lesions- none, excoriation- none. Steroid fragility and echymoses Lymphadenopathy- none Head- atraumatic            Eyes- Gross vision intact, PERRLA, conjunctivae clear secretions            Ears- Hearing, canals normal            Nose- Clear, No-Septal dev, mucus, polyps, erosion, perforation             Throat- Mallampati II , mucosa clear , drainage- none, tonsils- atrophic Neck- flexible , trachea midline, no stridor , thyroid nl, carotid no bruit Chest - symmetrical excursion , unlabored           Heart/CV-+ IRR/ chronic AFib (no pacemaker) , no murmur , no gallop , no rub, nl s1 s2                            JVD- none , edema- trace, stasis changes- none, varices- none           Lung- +Very distant bilaterally, w/ diffuse slow expiratory wheeze,  Cough-none ,  dullness-none, rub-none,  +Pursed lips. + Bilateral breath sounds less on the right           Chest wall-  Abd- Br/ Gen/ Rectal- Not done, not indicated Extrem-  +power wheelchair Neuro- grossly intact to observation

## 2015-03-26 NOTE — Patient Instructions (Signed)
We can continue present meds and the Oxygen at 3l/ [employer]  Please call as needed

## 2015-04-05 ENCOUNTER — Ambulatory Visit: Payer: Medicare Other | Admitting: Neurology

## 2015-04-05 ENCOUNTER — Ambulatory Visit (INDEPENDENT_AMBULATORY_CARE_PROVIDER_SITE_OTHER): Payer: Medicare Other | Admitting: Neurology

## 2015-04-05 ENCOUNTER — Encounter: Payer: Self-pay | Admitting: Neurology

## 2015-04-05 VITALS — BP 128/70 | HR 77 | Ht 71.0 in | Wt 169.4 lb

## 2015-04-05 DIAGNOSIS — I639 Cerebral infarction, unspecified: Secondary | ICD-10-CM

## 2015-04-05 DIAGNOSIS — I480 Paroxysmal atrial fibrillation: Secondary | ICD-10-CM

## 2015-04-05 DIAGNOSIS — J449 Chronic obstructive pulmonary disease, unspecified: Secondary | ICD-10-CM | POA: Diagnosis not present

## 2015-04-05 DIAGNOSIS — I63411 Cerebral infarction due to embolism of right middle cerebral artery: Secondary | ICD-10-CM

## 2015-04-05 NOTE — Progress Notes (Signed)
STROKE NEUROLOGY FOLLOW UP NOTE  NAME: Mark [name] DOB: 01-31-1941  REASON FOR VISIT: stroke follow up HISTORY FROM: pt and wife and chart  Today we had the pleasure of seeing Mark [name] in follow-up at our Neurology Clinic. Pt was accompanied by wife.   History Summary Mark [name] is a 74 y.o. male with PMH of PAF not on AC, severe COPD with O2 dependent. He stated that in 11/2014, he was in a restaurant and had sudden onset word finding difficulties for 3-4 min and resolved. However, after that, wife found him to have intermittent confusion with remote control, dial phone numbers, slow in response to questions and mess up with date or time. Denies any weakness, numbness, LOC, dizziness, swallowing difficulty or seizure. He went to see PCP 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 He was seen by cardiology on 01/12/15 and had 2D ehco which was unremarkable. He was put on eliquis.  He was found to have PAF in 2012 when he was admitted for pneumonia, and was put on coumadin briefly. However, he developed bruise at his arms and he optioned to be off AC. He was on ASA since then. He also had aflutter and s/p ablation. He was told that his aflutter was cured but still has afib. He still has severe COPD and needs home O2, he is following with [name] and on steroids.  He is a former smoker, quit many years ago. Occasionally drink and denies illicit drugs.  Interval History During the interval time, the patient has been doing the same. No recurrent stroke like symptoms. Had MRA head and neck, confirmed right ICA occlusion and right MCA via collaterals. However, also showed right VA and right M2 high grade stenosis. Still has COPD on O2. BP today 128/70 and advised to avoid hypotension. Still on eliquis and lipitor, tolerating well.   Past Medical History  Diagnosis Date  . Atrial fibrillation   . Hyperlipidemia   . Weakness    . Dyspnea   . Headache(784.0)   . Osteopenia   . COPD (chronic obstructive pulmonary disease)   . Allergic rhinitis   . History of echocardiogram     Echo 5/16:  EF 55-60%, no RWMA, Gr 1 DD  . Incontinence of urine     and stool  . Memory loss   . Confusion   . Numbness     bilateral forearms   Past Surgical History  Procedure Laterality Date  . Catheter ablation    . Cataract extraction, bilateral    . Appendectomy  1958  . Inguinal hernia repair  1975, 1992  . Video assisted thoracoscopy (vats)/thorocotomy  08/17/2012    resection / stapling of blebs, mechanical pleurodesis   . Video assisted thoracoscopy  08/17/2012    Procedure: VIDEO ASSISTED THORACOSCOPY;  Surgeon: [name], MD;  Location[facility] OR;  Service: Thoracic;  Laterality: Right;  . Resection of apical bleb  08/17/2012    Procedure: RESECTION OF APICAL BLEB;  Surgeon: [name], MD;  Location: [facility] OR;  Service: Thoracic;  Laterality: Right;  stapling of bleb  . Video bronchoscopy  08/27/2012    Procedure: VIDEO BRONCHOSCOPY;  Surgeon: [name], MD;  Location: [facility] OR;  Service: Thoracic;  Laterality: N/A;  evaluation of endobronchial valves   Family History  Problem Relation Age of Onset  . Heart attack Father   . COPD  Father   . Lung cancer Father   . COPD Brother     deceased  . COPD Sister   . Stroke Mother   . Hypertension Brother    Current Outpatient Prescriptions  Medication Sig Dispense Refill  . acetaminophen (TYLENOL) 325 MG tablet Take 650 mg by mouth every 6 (six) hours as needed.    Marland Kitchen albuterol (PROAIR HFA) 108 (90 BASE) MCG/ACT inhaler Inhale 2 puffs into the lungs every 4 (four) hours as needed for wheezing or shortness of breath. For shortness of breath/wheezing 1 Inhaler prn  . albuterol (PROVENTIL) (2.5 MG/3ML) 0.083% nebulizer solution USE ONE VIAL VIA NEBULIZER 4 TIMES DAILY 120 mL 4  . alendronate (FOSAMAX) 70 MG tablet Take 70 mg by mouth every 7 (seven) days. Takes on  Tuesday Take with a full glass of water on an empty stomach.    Marland Kitchen apixaban (ELIQUIS) 5 MG TABS tablet Take 1 tablet (5 mg total) by mouth 2 (two) times daily. 60 tablet 11  . atorvastatin (LIPITOR) 20 MG tablet Take 1 tablet (20 mg total) by mouth daily. 90 tablet 3  . Calcium-Magnesium-Vitamin D (ONE-A-DAY CALCIUM PLUS) 500-50-100 MG-MG-UNIT CHEW Chew 1 tablet by mouth 2 (two) times daily.      Marland Kitchen EPINEPHrine 0.3 mg/0.3 mL IJ SOAJ injection Inject 0.3 mLs (0.3 mg total) into the muscle once. For severe shortness of breath 1 Device prn  . furosemide (LASIX) 20 MG tablet TAKE 1 TABLET (20 MG TOTAL) BY MOUTH EVERY OTHER DAY. 15 tablet 6  . guaiFENesin (MUCINEX) 600 MG 12 hr tablet Take 1-2 mg by mouth every 12 (twelve) hours as needed. For cough    . magnesium oxide (MAG-OX) 400 MG tablet Take 1 tablet (400 mg total) by mouth daily.    . Multiple Vitamins-Minerals (CENTRUM SILVER PO) Take 1 tablet by mouth daily.      . Nebulizers (COMPRESSOR/NEBULIZER) MISC Use as directed 1 each 0  . omeprazole (PRILOSEC) 20 MG capsule Take 1 capsule (20 mg total) by mouth 2 (two) times daily. 60 capsule 1  . predniSONE (DELTASONE) 20 MG tablet TAKE 1 TABLET (20 MG TOTAL) BY MOUTH DAILY. 30 tablet 5  . propafenone (RYTHMOL) 225 MG tablet TAKE ONE TABLET BY MOUTH TWICE A DAY 180 tablet 3  . Umeclidinium-Vilanterol (ANORO ELLIPTA) 62.5-25 MCG/INH AEPB Inhale 1 puff into the lungs daily. RINSE MOUTH WELL AFTER USE 3 each 3  . verapamil (CALAN-SR) 240 MG CR tablet TAKE 1 TABLET BY MOUTH EVERY DAY 30 tablet 6   No current facility-administered medications for this visit.   Allergies  Allergen Reactions  . Shrimp [Shellfish Allergy]   . Zolpidem Tartrate     disoriented   Social History   Social History  . Marital Status: Married    Spouse Name: Windy Kalata  . Number of Children: 6  . Years of Education: 8   Occupational History  . Retired     Air traffic controller   Social History Main Topics    . Smoking status: Former Smoker -- 3.00 packs/day for 15 years    Types: Cigarettes  . Smokeless tobacco: Former Neurosurgeon    Quit date: 09/01/1965  . Alcohol Use: No  . Drug Use: No  . Sexual Activity: Not on file   Other Topics Concern  . Not on file   Social History Narrative   Married, 6 children   Right handed   Caffeine use - soda 2 daily    Review  of Systems Full 14 system review of systems performed and notable only for those listed, all others are neg:  Constitutional:  Fever/chills, fatigue Cardiovascular:  Ear/Nose/Throat:  Runny nose, trouble swallowing, drooling Skin:  Eyes:  Eye itching and eye redness Respiratory:  SOB, wheezing, cough Gastroitestinal:  Genitourinary: Hematology/Lymphatic:   Endocrine: Feeling cold/hot Musculoskeletal:  Allergy/Immunology:   Neurological:   Psychiatric:  Sleep:   Physical Exam  Filed Vitals:   04/05/15 0836  BP: 128/70  Pulse: 77    General - Well nourished, well developed, on portable O2, no acute distress.  Ophthalmologic - phone and not visualized due to eye movement.  Cardiovascular - Regular rate and rhythm, no afib rhythm.   Mental Status -  Level of arousal and orientation to time, place, and person were intact. Language including expression, naming, repetition, comprehension, reading, and writing was assessed and found intact. Attention span and concentration were normal. Recent and remote memory were intact on registration 3/3, delayed recall 2/3. Fund of Knowledge was assessed and was intact.  Cranial Nerves II - XII - II - Visual field intact OU. III, IV, VI - Extraocular movements intact. V - Facial sensation intact bilaterally. VII - Facial movement intact bilaterally. VIII - Hearing & vestibular intact bilaterally. X - Palate elevates symmetrically. XI - Chin turning & shoulder shrug intact bilaterally. XII - Tongue protrusion intact.  Motor Strength - The patient's strength was normal in  all extremities and pronator drift was absent.  Bulk was normal and fasciculations were absent.   Motor Tone - Muscle tone was assessed at the neck and appendages and was normal.  Reflexes - The patient's reflexes were symmetrical in all extremities and he had no pathological reflexes.  Sensory - Light touch, temperature/pinprick were assessed and were normal.    Coordination - The patient had normal movements in the hands with no ataxia or dysmetria.  Tremor was absent.  Gait and Station - not tested and pt in wheelchair.   Imaging  I have personally reviewed the radiological images below and agree with the radiology interpretations.  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 Acute and subacute RIGHT hemisphere ischemia as described. Tiny focus of chronic hemorrhage RIGHT frontal subcortical white matter could be acute or chronic Atrophy with chronic microvascular ischemic change.  2D echo -01/18/15  - Left ventricle: The cavity size was normal. Wall thickness was normal. Systolic function was normal. The estimated ejection fraction was in the range of 55% to 60%. Wall motion was normal; there were no regional wall motion abnormalities. Doppler parameters are consistent with abnormal left ventricular relaxation (grade 1 diastolic dysfunction). The E/e&' ratio is between 8-15, suggesting indeterminate LV filling pressure. - Left atrium: The atrium was normal in size Impressions: - Compared to the prior echo in 2011, there has been no change in LVEF.  MRA head - 1. The right ICA is occluded, with some retrograde filling of the right ICA terminus, possible via the anterior communicating artery and contralateral circulation. 2. The right M2 inferior division of right MCA appears to be occluded.  3. Moderate stenosis of left M2 inferior division of left MCA. 4. 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  MRA neck -  Abnormal MRI neck (with and without) demonstrating: 1. 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 2. The right vertebral artery has focal high grade origin stenosis. There is addition focal stenosis (>50%) in the  right vertebral artery 5.8cm distal to the vertebral origin, and then normal flow up to the vertebrobasilar junction. 3. The left internal carotid artery and left vertebral artery have no stenosis.   Lab Review Component     Latest Ref Rng 02/21/2014 01/23/2015  Cholesterol, Total     100 - 199 mg/dL  161 (H)  Triglycerides     0 - 149 mg/dL  096 (H)  HDL Cholesterol     >39 mg/dL  53  VLDL Cholesterol Cal     5 - 40 mg/dL  47 (H)  LDL (calc)     0 - 99 mg/dL  045 (H)  Total CHOL/HDL Ratio     0.0 - 5.0 ratio units  4.6  TSH     0.450 - 4.500 uIU/mL 0.76 1.960  Free T4     0.82 - 1.77 ng/dL  4.09  Hemoglobin W1X     4.8 - 5.6 %  6.5 (H)  Est. average glucose Bld gHb Est-mCnc       140     Assessment and Plan:   In summary, Mark [name] is a 74 y.o. male with PMH of paroxysmal A. fib not on AC, severe COPD requiring home oxygen follows up in neuro clinic. MRI showed right MCA infarct. Etiology most likely embolic pattern secondary to A. fib not on anticoagulation. 2-D echo normal EF. He is on eliquis for stroke prevention. MRA head and neck confirmed right ICA occlusion but also showed diffuse intracranial stenosis. Need risk factor follow up. Continue eliquis for stroke prevention. Will do TCD VMR to evaluate strok.  Plan: - continue eliquis and lipitor for stroke prevention - check BP at home - Follow up with your primary care physician for stroke risk factor modification. Recommend maintain blood pressure goal <130/80, diabetes with hemoglobin A1c goal below 6.5% and lipids with LDL cholesterol goal below 70 mg/dL.  - TCD vasomotor activity to evaluate stroke risk in COPD patient with ICA occlusion. - follow up in 4  months.  Orders Placed This Encounter  Procedures  . [place] TCD VMR    Standing Status: Future     Number of Occurrences:      Standing Expiration Date: 10/06/2015    Order Specific Question:  Reason for Exam (SYMPTOM  OR DIAGNOSIS REQUIRED)    Answer:  COPD pt with right ICA occlusion    Order Specific Question:  Preferred imaging location?    Answer:  Internal    Meds ordered this encounter  Medications  . acetaminophen (TYLENOL) 325 MG tablet    Sig: Take 650 mg by mouth every 6 (six) hours as needed.    Patient Instructions  - continue eliquis and lipitor for stroke prevention - check BP at home - Follow up with your primary care physician for stroke risk factor modification. Recommend maintain blood pressure goal <130/80, diabetes with hemoglobin A1c goal below 6.5% and lipids with LDL cholesterol goal below 70 mg/dL.  - TCD vasomotor activity to evaluate stroke risk in COPD patient with ICA occlusion. - follow up in 4 months.    Marvel Plan, MD PhD [facility] [address] [place], [place] [address] [phone]

## 2015-04-05 NOTE — Patient Instructions (Signed)
-   continue eliquis and lipitor for stroke prevention - check BP at home - Follow up with your primary care physician for stroke risk factor modification. Recommend maintain blood pressure goal <130/80, diabetes with hemoglobin A1c goal below 6.5% and lipids with LDL cholesterol goal below 70 mg/dL.  - TCD vasomotor activity to evaluate stroke risk in COPD patient with ICA occlusion. - follow up in 4 months.

## 2015-04-11 ENCOUNTER — Ambulatory Visit (INDEPENDENT_AMBULATORY_CARE_PROVIDER_SITE_OTHER): Payer: Medicare Other

## 2015-04-11 DIAGNOSIS — I63411 Cerebral infarction due to embolism of right middle cerebral artery: Secondary | ICD-10-CM

## 2015-05-01 ENCOUNTER — Encounter: Payer: Self-pay | Admitting: *Deleted

## 2015-05-03 ENCOUNTER — Ambulatory Visit (INDEPENDENT_AMBULATORY_CARE_PROVIDER_SITE_OTHER): Payer: Medicare Other | Admitting: Internal Medicine

## 2015-05-03 ENCOUNTER — Ambulatory Visit
Admission: RE | Admit: 2015-05-03 | Discharge: 2015-05-03 | Disposition: A | Discharge status: Home / Self Care | Payer: Medicare Other | Source: Ambulatory Visit | Attending: Internal Medicine | Admitting: Internal Medicine

## 2015-05-03 VITALS — BP 98/60 | HR 86 | Ht 71.0 in | Wt 169.0 lb

## 2015-05-03 DIAGNOSIS — R0689 Other abnormalities of breathing: Secondary | ICD-10-CM

## 2015-05-03 DIAGNOSIS — I639 Cerebral infarction, unspecified: Secondary | ICD-10-CM | POA: Diagnosis not present

## 2015-05-03 DIAGNOSIS — I480 Paroxysmal atrial fibrillation: Secondary | ICD-10-CM | POA: Diagnosis not present

## 2015-05-03 DIAGNOSIS — R0989 Other specified symptoms and signs involving the circulatory and respiratory systems: Secondary | ICD-10-CM

## 2015-05-03 NOTE — Patient Instructions (Signed)
Medication Instructions: 1) Decrease verapamil 240 mg to 1/2 tablet (120 mg) once daily  Labwork: - none  Procedures/Testing: - A chest x-ray takes a picture of the organs and structures inside the chest, including the heart, lungs, and blood vessels. This test can show several things, including, whether the heart is enlarges; whether fluid is building up in the lungs; and whether pacemaker / defibrillator leads are still in place.- [facility])  Follow-Up: - Your physician wants you to follow-up in: 1 year with Dr. [name]. You will receive a reminder letter in the mail two months in advance. If you don't receive a letter, please call our office to schedule the follow-up appointment.  Any Additional Special Instructions Will Be Listed Below (If Applicable).

## 2015-05-03 NOTE — Progress Notes (Signed)
Patient Care Team: [name], MD as PCP - General (Family Medicine)   HPI  Mark [name] is a 74 y.o. male Seen in followup for paroxysmal atrial fibrillation in the setting of severe COPD. He has taken propafenone in the past  His complaints of ongoing worsening dyspnea. He does have some peripheral edema.  He improved with the introduction of a low-dose diuretic we will decrease in edema and improved dyspnea.  In the past we discussed anticoagulation and he has been reluctant to undertake anticoagulants He was seen 5/16 at which time he was noted to have had an ischemic stroke. Seen by neurology. Anticoagulation with apixaban had been started.  He   had spells where in his O2 sat machine he is reported heart rates into the 40s. This is associated with increasing fatigue.  The continued use of an event recorder to try to identify what was causing the measure her heart rate of 40. Recordings demonstrated only sinus rhythm with some PVCs and a pattern of bigeminy. He also had some PVCs but no tracking..   Past Medical History  Diagnosis Date  . Atrial fibrillation   . Hyperlipidemia   . Weakness   . Dyspnea   . Headache(784.0)   . Osteopenia   . COPD (chronic obstructive pulmonary disease)   . Allergic rhinitis   . History of echocardiogram     Echo 5/16:  EF 55-60%, no RWMA, Gr 1 DD  . Incontinence of urine     and stool  . Memory loss   . Confusion   . Numbness     bilateral forearms    Past Surgical History  Procedure Laterality Date  . Catheter ablation    . Cataract extraction, bilateral    . Appendectomy  1958  . Inguinal hernia repair  1975, 1992  . Video assisted thoracoscopy (vats)/thorocotomy  08/17/2012    resection / stapling of blebs, mechanical pleurodesis   . Video assisted thoracoscopy  08/17/2012    Procedure: VIDEO ASSISTED THORACOSCOPY;  Surgeon: [name], MD;  Location[facility] OR;  Service: Thoracic;  Laterality: Right;  .  Resection of apical bleb  08/17/2012    Procedure: RESECTION OF APICAL BLEB;  Surgeon: [name], MD;  Location: [facility] OR;  Service: Thoracic;  Laterality: Right;  stapling of bleb  . Video bronchoscopy  08/27/2012    Procedure: VIDEO BRONCHOSCOPY;  Surgeon: [name], MD;  Location: [facility] OR;  Service: Thoracic;  Laterality: N/A;  evaluation of endobronchial valves    Current Outpatient Prescriptions  Medication Sig Dispense Refill  . acetaminophen (TYLENOL) 325 MG tablet Take 650 mg by mouth every 6 (six) hours as needed.    Marland Kitchen albuterol (PROAIR HFA) 108 (90 BASE) MCG/ACT inhaler Inhale 2 puffs into the lungs every 4 (four) hours as needed for wheezing or shortness of breath. For shortness of breath/wheezing 1 Inhaler prn  . albuterol (PROVENTIL) (2.5 MG/3ML) 0.083% nebulizer solution USE ONE VIAL VIA NEBULIZER 4 TIMES DAILY 120 mL 4  . alendronate (FOSAMAX) 70 MG tablet Take 70 mg by mouth every 7 (seven) days. Takes on Tuesday Take with a full glass of water on an empty stomach.    Marland Kitchen apixaban (ELIQUIS) 5 MG TABS tablet Take 1 tablet (5 mg total) by mouth 2 (two) times daily. 60 tablet 11  . atorvastatin (LIPITOR) 20 MG tablet Take 1 tablet (20 mg total) by mouth daily. 90 tablet 3  .  Calcium-Magnesium-Vitamin D (ONE-A-DAY CALCIUM PLUS) 500-50-100 MG-MG-UNIT CHEW Chew 1 tablet by mouth 2 (two) times daily.      Marland Kitchen EPINEPHrine 0.3 mg/0.3 mL IJ SOAJ injection Inject 0.3 mLs (0.3 mg total) into the muscle once. For severe shortness of breath 1 Device prn  . furosemide (LASIX) 20 MG tablet TAKE 1 TABLET (20 MG TOTAL) BY MOUTH EVERY OTHER DAY. 15 tablet 6  . guaiFENesin (MUCINEX) 600 MG 12 hr tablet Take 1-2 mg by mouth every 12 (twelve) hours as needed. For cough    . magnesium oxide (MAG-OX) 400 MG tablet Take 1 tablet (400 mg total) by mouth daily.    . Multiple Vitamins-Minerals (CENTRUM SILVER PO) Take 1 tablet by mouth daily.      . Nebulizers (COMPRESSOR/NEBULIZER) MISC Use as directed  1 each 0  . omeprazole (PRILOSEC) 20 MG capsule Take 1 capsule (20 mg total) by mouth 2 (two) times daily. 60 capsule 1  . predniSONE (DELTASONE) 20 MG tablet TAKE 1 TABLET (20 MG TOTAL) BY MOUTH DAILY. 30 tablet 5  . propafenone (RYTHMOL) 225 MG tablet TAKE ONE TABLET BY MOUTH TWICE A DAY 180 tablet 3  . Umeclidinium-Vilanterol (ANORO ELLIPTA) 62.5-25 MCG/INH AEPB Inhale 1 puff into the lungs daily. RINSE MOUTH WELL AFTER USE 3 each 3  . verapamil (CALAN-SR) 240 MG CR tablet TAKE 1 TABLET BY MOUTH EVERY DAY 30 tablet 6   No current facility-administered medications for this visit.    Allergies  Allergen Reactions  . Shrimp [Shellfish Allergy]   . Zolpidem Tartrate     disoriented    Review of Systems negative except from HPI and PMH  Physical Exam BP 98/60 mmHg  Pulse 86  Ht  (1.803 m)  Wt 169 lb (76.658 kg)  BMI 23.58 kg/m2 Well developed and well nourished wearing oxygen in mod resp   Distress pursed  lip breathing HENT normal E scleral and icterus clear supple  Markedly decreased breath sounds right compared to left across the entire posterior back Rapid but Regular rate and rhythm, no murmurs gallops or rub Soft with active bowel sounds No clubbing cyanosis  Edema Alert and oriented, grossly normal motor and sensory function Skin Warm and Dry  Event recorder demonstrated PVCs and PACs with no significant arrhythmia  Assessment and  Plan  HFpEF   Oxygen-dependent COPD   PVCs/PACs   Sinus tachycardia    He continues to struggle with shortness of breath. This is apparently worsening. His examination today was strikingly abnormal with very few breath sounds in the right lung. Looking back at Dr. Roxy Cedar notes from just last month this was not described. I reviewed the x-rays over the last couple years and is clear volume loss on the right; I am not sure whether this is sufficient to explain the abnormalities on examination. I will repeat the chest x-ray and  send this information over to [name] for his input

## 2015-05-05 NOTE — Assessment & Plan Note (Signed)
Residual scarring right lung and relatively worse breath sounds on that side.

## 2015-05-05 NOTE — Assessment & Plan Note (Signed)
He remains oxygen dependent with very limited exercise tolerance, requiring power chair. There is not much we can improve with medication. He might possibly benefit from NIV respiratory assist device at night

## 2015-05-12 ENCOUNTER — Other Ambulatory Visit: Payer: Self-pay | Admitting: Internal Medicine

## 2015-05-16 DIAGNOSIS — Z961 Presence of intraocular lens: Secondary | ICD-10-CM | POA: Insufficient documentation

## 2015-05-16 DIAGNOSIS — H04129 Dry eye syndrome of unspecified lacrimal gland: Secondary | ICD-10-CM | POA: Insufficient documentation

## 2015-05-28 ENCOUNTER — Encounter: Payer: Self-pay | Admitting: Internal Medicine

## 2015-06-26 ENCOUNTER — Other Ambulatory Visit: Payer: Self-pay | Admitting: Internal Medicine

## 2015-07-23 ENCOUNTER — Telehealth: Payer: Self-pay | Admitting: Internal Medicine

## 2015-07-23 MED ORDER — AMOXICILLIN-POT CLAVULANATE 875-125 MG PO TABS: 1.0000 | ORAL_TABLET | Freq: Two times a day (BID) | ORAL | Status: DC

## 2015-07-23 MED ORDER — PROMETHAZINE-CODEINE 6.25-10 MG/5ML PO SYRP: 5.0000 mL | ORAL_SOLUTION | Freq: Four times a day (QID) | ORAL | Status: DC | PRN

## 2015-07-23 NOTE — Telephone Encounter (Signed)
Called spoke with pt. C/o prod cough (yellow phlem), slight wheezing/chest tx, PND, nasal cong. He reports he coughed up blood tinged phlem about 4 times yesterday morning but none since. Reports lungs feel "sore" from coughing. Pt wants to be seen. Please advise Dr. Maple HudsonYoung thanks  Allergies  Allergen Reactions  . Shrimp [Shellfish Allergy]   . Zolpidem Tartrate     disoriented     Current Outpatient Prescriptions on File Prior to Visit  Medication Sig Dispense Refill  . acetaminophen (TYLENOL) 325 MG tablet Take 650 mg by mouth every 6 (six) hours as needed.    Marland Kitchen. albuterol (PROAIR HFA) 108 (90 BASE) MCG/ACT inhaler Inhale 2 puffs into the lungs every 4 (four) hours as needed for wheezing or shortness of breath. For shortness of breath/wheezing 1 Inhaler prn  . albuterol (PROVENTIL) (2.5 MG/3ML) 0.083% nebulizer solution USE ONE VIAL VIA NEBULIZER 4 TIMES DAILY 120 mL 4  . alendronate (FOSAMAX) 70 MG tablet Take 70 mg by mouth every 7 (seven) days. Takes on Tuesday Take with a full glass of water on an empty stomach.    Marland Kitchen. apixaban (ELIQUIS) 5 MG TABS tablet Take 1 tablet (5 mg total) by mouth 2 (two) times daily. 60 tablet 11  . atorvastatin (LIPITOR) 20 MG tablet Take 1 tablet (20 mg total) by mouth daily. 90 tablet 3  . Calcium-Magnesium-Vitamin D (ONE-A-DAY CALCIUM PLUS) 500-50-100 MG-MG-UNIT CHEW Chew 1 tablet by mouth 2 (two) times daily.      Marland Kitchen. EPINEPHrine 0.3 mg/0.3 mL IJ SOAJ injection Inject 0.3 mLs (0.3 mg total) into the muscle once. For severe shortness of breath 1 Device prn  . furosemide (LASIX) 20 MG tablet TAKE 1 TABLET (20 MG TOTAL) BY MOUTH EVERY OTHER DAY. 15 tablet 11  . guaiFENesin (MUCINEX) 600 MG 12 hr tablet Take 1-2 mg by mouth every 12 (twelve) hours as needed. For cough    . magnesium oxide (MAG-OX) 400 MG tablet Take 1 tablet (400 mg total) by mouth daily.    . Multiple Vitamins-Minerals (CENTRUM SILVER PO) Take 1 tablet by mouth daily.      . Nebulizers  (COMPRESSOR/NEBULIZER) MISC Use as directed 1 each 0  . omeprazole (PRILOSEC) 20 MG capsule Take 1 capsule (20 mg total) by mouth 2 (two) times daily. 60 capsule 1  . predniSONE (DELTASONE) 20 MG tablet TAKE 1 TABLET (20 MG TOTAL) BY MOUTH DAILY. 30 tablet 5  . propafenone (RYTHMOL) 225 MG tablet TAKE ONE TABLET BY MOUTH TWICE A DAY 180 tablet 3  . Umeclidinium-Vilanterol (ANORO ELLIPTA) 62.5-25 MCG/INH AEPB Inhale 1 puff into the lungs daily. RINSE MOUTH WELL AFTER USE 3 each 3  . verapamil (CALAN-SR) 240 MG CR tablet Take 0.5 tablets (120 mg total) by mouth daily. 15 tablet 11   No current facility-administered medications on file prior to visit.

## 2015-07-23 NOTE — Telephone Encounter (Signed)
We are completely full. Suggest we offer: Augmentin 875 mg, # 10, 1 twice daily Prometh codeine 200 ml,  5 ml every 6 hours prn cough, no refill He can increase prednisone to 40 mg daily next few days if he feels he needs it, then return to 20 mg daily when able

## 2015-07-23 NOTE — Telephone Encounter (Signed)
Called made pt aware of below. Verbalized understanding and RX's called in. Nothing further needed

## 2015-07-26 ENCOUNTER — Ambulatory Visit: Payer: Medicare Other | Admitting: Internal Medicine

## 2015-07-26 ENCOUNTER — Encounter: Payer: Self-pay | Admitting: Internal Medicine

## 2015-07-26 MED ORDER — ALBUTEROL SULFATE HFA 108 (90 BASE) MCG/ACT IN AERS: 2.0000 | INHALATION_SPRAY | RESPIRATORY_TRACT | Status: AC | PRN

## 2015-07-26 MED ORDER — ARFORMOTEROL TARTRATE 15 MCG/2ML IN NEBU: 15.0000 ug | INHALATION_SOLUTION | Freq: Two times a day (BID) | RESPIRATORY_TRACT | Status: DC

## 2015-07-26 MED ORDER — AMOXICILLIN-POT CLAVULANATE 875-125 MG PO TABS: 1.0000 | ORAL_TABLET | Freq: Two times a day (BID) | ORAL | Status: DC

## 2015-07-26 NOTE — Progress Notes (Unsigned)
Patient ID: Mark [name], male    DOB: 11-09-1940, 74 y.o.   MRN: [id number]  HPI 01/17/11- 74 yo with respiratory failure/ COPD, chronic AFib. Wife here Last here 11/04/10- Note reviewed Got a cold 2 weeks ago. Initally better. In last week-10 days has had more malaise, cough esp in last 3 days. Last antibiotic 2-3 months ago.  Went back up to 20 mg daily prednisone. Was never able to wean off last year when we tried. He has been on steroids many years..   05/07/11-  50 yo M former 3 PPD smoker with respiratory failure/ COPD, chronic AFib. Wife here No major changes. Uses nebulizer 3-5x/ day, infrequent use of rescue inhaler.Stays on prednisone 20 mg daily. He couldn't function at 15 mg daily.No recent infection. Fought off a mild cold a few weeks ago. He used his standby doxy for that and feels it helped. Daily mucinex. Daily cough with some phlegm. For flu vax today. Oxygen continuous- 1.5 - 2 L/M.  In past few weeks has noted pain down from right hip to ankle- discussed as likely pinched nerve/ compression fx from steroids. Spends much of each day sitting.   11/05/11-70 yo M former 3 PPD smoker with respiratory failure/ COPD/ steroid dependent, chronic AFib. Wife here. PCP Dr [name] Had chest cold at [facility] but finally better in past month. Remains on prednisone 20 mg daily. He has been able to be much more active, feeling better. Up and around more in house on O2 2 L/M with less need for wheelchair and with no acute issues. Denies infection, blood, chest pain. Had injections for back pain.   05/07/12- 28 yo M former 3 PPD smoker with respiratory failure/ COPD/ steroid dependent, chronic AFib.   Wife here.  PCP Dr [name] Denies any SOB, wheezing, cough, or congestion for about the past 2 weeks Remains on oxygen 3 L/[employer].  They plan to fly to [facility] for a family wedding. I filled out the airplane form. They will be taking a portable concentrator. We discussed oxygen, altitude and  exertion. He remains steroid dependent 20 mg prednisone daily.  08/10/12- 39 yo M former 3 PPD smoker with respiratory failure/ COPD/ steroid dependent, chronic AFib.   Wife here.  PCP Dr [name] ACUTE VISIT: since Sunday having a hard time keep in O2 levels up even with O2; no energy and unable to do much activites. Came back in wheelchair and O2 was 86% on 3l/m cont. Had had a cold and started doxycycline yesterday. Denies fever,  chest pain, palpitation, little cough, no blood, no edema. Some pain across upper back and shoulders.  09/28/12- 24 yo M former 3 PPD smoker with respiratory failure/ COPD/ steroid dependent, chronic AFib.   Wife here.  Post Hospital: No more than usual SOB or wheezing since being in hospital prolonged stay 12/17-1/7 for prolonged R spontaneous PTX with chest tube.  Chest tube is still in with a drain, followed by Dr Donata Clay. Breathing is better. Antibiotics are finished. Continues oxygen 2 L. CT chest 08/26/12-  IMPRESSION:  1. Moderate right hydropneumothorax with right chest tube in  place, terminating in the anterior mediastinal region. Subcutaneous  emphysema along the right chest wall.  2. Postoperative changes in the right hemithorax with associated  soft tissue in the upper right hemithorax.  3. Centrilobular emphysema with possible superimposed airspace  disease in the right lower lobe. The latter is of unknown  chronicity and appears somewhat fibrotic.  Original Report Authenticated By: Leanna BattlesMelinda Blietz, M.D. CXR 09/29/12 IMPRESSION:  1. Stable right-sided chest tube with persistent right-sided  pneumothorax.  2. Persistent right midlung density.  3. Stable severe underlying emphysematous changes.  Original Report Authenticated By: Rudie MeyerP. Gallerani, M.D.  11/09/12- 74 yo M former 3 PPD smoker with respiratory failure/ COPD/ steroid dependent, chronic AFib.   Wife here.  FOLLOWS FOR: has had a hard time since last OV; was recently in hospital, admitted through  ED on 10/30/2012 when chest tube clotted. Fluid culture from tube was negative. He was treated for sepsis. Still feels weak/short of breath but pain is controlled. Little cough now and no fever or sweat CXR 11/03/12 IMPRESSION:  Stable position of the right chest tube and stable small right  pneumothorax.  Stable postsurgical changes in the right lung.  Original Report Authenticated By: Richarda OverlieAdam Henn, M.D.  6/181/473-  72 yo M former 3 PPD smoker with respiratory failure/ COPD/ steroid dependent, chronic AFib.   Wife here.  FOLLOWS FOR: continues to have SOB and gives out sooner Right chest tube has finally been removed and he says he is doing better O2 2-3 L/[employer]. Unable to wean prednisone below 20 mg daily CXR 12/22/12 IMPRESSION:  Interval removal of right chest tube. No evidence for residual  right-sided pneumothorax.  Postsurgical change in the right midlung with scarring noted at the  right base.  Original Report Authenticated By: Kennith CenterEric Mansell, M.D.  06/16/13-  74 yo M former 3 PPD smoker with respiratory failure/ COPD/ steroid dependent, chronic AFib.   Wife here.  follows for-4 month rtn.  Pt c/o SOB constantly, yellow mucous brought out by clearing throat.  Some sinus congestion. Needs nebulizer machine/[employer]. Continues prednisone 20 mg daily long-term. Oxygen 2 L continuous. Had flu shot  08/11/13-72 yo M former 3 PPD smoker with respiratory failure/ COPD/ steroid dependent, chronic AFib.   Wife here.  Pt c/o increased SOB, fatigue, chest pain, productive cough with yellow phlegm  x 4 days.  Pt is currently taking zpak on day 3. Had caught a cold one week ago. Feels weak, coughing yellow/white. Nebulizer machine is broken. Using his home oxygen flow pressure to drive his nebulizer medication, used for 5 times daily. Chronic prednisone 20 mg daily. He understands issues of osteoporosis, immunosuppression, adrenal insufficiency. CXR 08/11/13-  IMPRESSION:  No acute cardiopulmonary  disease. No change from the prior study.  11/16/13- 74 yo M former 3 PPD smoker with respiratory failure/ COPD/ steroid dependent, chronic AFib.   Wife here. FOLLOWS FOR: since having mobile scooter patient is able to get around more and states his breathing is doing well.  No recent infection.  Often some wheeze- took neb earlier. O2 2-3L/M/ [employer]. Maintenance prednisone 20 mg/ day-chronic.   03/23/14- 74 yo M former 3 PPD smoker with respiratory failure/ COPD/ steroid dependent, chronic AFib.   Wife here FOLLOWS FOR:  breathing has been fair per pt.  he stated that he is just not able to do anything.  He thinks he is gradually declining, which is what we would expect for his disease progression. No acute event. Occasional ankle edema has been better controlled.  O2 2-3L/M/ [employer]. Maintenance prednisone 20 mg/ day-chronic.   07/24/14- 74 yo M former 3 PPD smoker with respiratory failure/ COPD/ steroid dependent, chronic AFib.   Wife here FOLLOWS FOR: Not breathing as well as he would like. Continues O2 2-3L 24/7; DME is [employer]  11/22/14- 74 yo M former 3 PPD smoker with  respiratory failure/ COPD/ steroid dependent, chronic AFib.    FOLLOWS FOR: Continues to have SOB often; using O2 all the time O2 2-3L/M/ [employer]. Anoro refill script sent He feels his exercise tolerance is slowly declining. Does not blame pollen season. Morning cough with clear phlegm. Using nebulizer up to 3 times daily for modest benefit. No acute events.  03/26/15- 54 yo M former 3 PPD smoker with respiratory failure/ COPD/ steroid dependent, complicated by chronic AFib, CVA FOLLOWS FOR: Pt states he can get air in enough but does not feel like he is processing the air like he should. Continues oxygen 3 L continuous/[employer]. Little cough or wheeze. We discussed residual scarring after pneumothorax, chest tube, VATS in 2014 CXR 11/22/14 IMPRESSION: Stable chronic change. No active process. Electronically Signed  By: Dwyane Dee M.D.  On: 11/22/2014 10:14  07/26/2015-74 year old male former 3 pack per day smoker with respiratory failure/COPD/steroid dependent, complicated by chronic A. fib, CVA O2 3 L continuous/[employer] FOLLOWS FOR: Pt states he is having more SOB than usual; Using O2 all day through [employer]. Recent exacerbation-we sent Augmentin 5 days, codeine cough syrup, suggested increase prednisone dose on 07/23/2015  CXR 05/03/2015 IMPRESSION: Severe COPD changes and scarring with postsurgical changes of RIGHT hemi thorax. No definite acute abnormalities. Electronically Signed  By: Ulyses Southward M.D.  On: 05/03/2015 15:12  Review of Systems- see HPI Constitutional:   No-   weight loss, night sweats, fevers, chills, fatigue, lassitude. HEENT:   No-  headaches, difficulty swallowing, tooth/dental problems, sore throat,       No-  sneezing, itching, ear ache, nasal congestion, post nasal drip,  CV:  No-   chest pain, orthopnea, PND, no recent swelling in lower extremities, no-anasarca, dizziness, +palpitations Resp: + shortness of breath with exertion or at rest.              +cough,  No-  coughing up of blood.              No-   change in color of mucus.  +Slight wheezing.   Skin: No-   rash or lesions. GI:  No-   heartburn, indigestion, abdominal pain, nausea, vomiting, GU:  MS:  No-   joint pain or swelling.   Neuro- nothing unusual  Psych:  No- change in mood or affect. No depression or anxiety.  No memory loss.  Objective:   Physical Exam General- Alert, Oriented, Affect-appropriate, Distress- none acute, +power chair, O2 3 L Skin- rash-none, lesions- none, excoriation- none. Steroid fragility and echymoses Lymphadenopathy- none Head- atraumatic            Eyes- Gross vision intact, PERRLA, conjunctivae clear secretions            Ears- Hearing, canals normal            Nose- Clear, No-Septal dev, mucus, polyps, erosion, perforation             Throat- Mallampati II , mucosa  clear , drainage- none, tonsils- atrophic Neck- flexible , trachea midline, no stridor , thyroid nl, carotid no bruit Chest - symmetrical excursion , unlabored           Heart/CV-+ IRR/ chronic AFib (no pacemaker) , no murmur , no gallop , no rub, nl s1 s2                            JVD- none , edema- trace, stasis changes- none, varices- none  Lung- +Very distant bilaterally, w/ diffuse slow expiratory wheeze,  Cough-none ,  dullness-none, rub-none,  +Pursed lips. + Bilateral breath sounds less on the right           Chest wall-  Abd- Br/ Gen/ Rectal- Not done, not indicated Extrem- +power wheelchair Neuro- grossly intact to observation

## 2015-07-26 NOTE — Patient Instructions (Signed)
Script sent for augmentin antibiotic to have available for the holidays  Samples Brovana neb solution.  You can try this instead of your regular albuterol nebulizer solution. It lasts longer between doses.       When you feel able, drop our prednisone back to 20 mg daily  Please call as needed

## 2015-07-30 ENCOUNTER — Other Ambulatory Visit: Payer: Self-pay | Admitting: Internal Medicine

## 2015-07-30 NOTE — Telephone Encounter (Signed)
Ok to refill 

## 2015-07-30 NOTE — Telephone Encounter (Signed)
Last OV: 07-26-15 Pending OV: 01-24-16  Rx Last filled: 07/23/15 for 200 ml.   CY Please advise on refill. Thanks.

## 2015-08-06 ENCOUNTER — Inpatient Hospital Stay (HOSPITAL_COMMUNITY): Payer: Medicare Other

## 2015-08-06 ENCOUNTER — Ambulatory Visit: Payer: Medicare Other | Admitting: Neurology

## 2015-08-06 ENCOUNTER — Emergency Department (HOSPITAL_COMMUNITY): Payer: Medicare Other

## 2015-08-06 ENCOUNTER — Inpatient Hospital Stay (HOSPITAL_COMMUNITY)
Admission: EM | Admit: 2015-08-06 | Discharge: 2015-08-08 | DRG: 312 | Disposition: A | Discharge status: Home Health | Payer: Medicare Other | Attending: Family Medicine | Admitting: Family Medicine

## 2015-08-06 ENCOUNTER — Encounter (HOSPITAL_COMMUNITY): Payer: Self-pay | Admitting: Emergency Medicine

## 2015-08-06 DIAGNOSIS — S51012A Laceration without foreign body of left elbow, initial encounter: Secondary | ICD-10-CM | POA: Diagnosis present

## 2015-08-06 DIAGNOSIS — W19XXXA Unspecified fall, initial encounter: Secondary | ICD-10-CM | POA: Diagnosis present

## 2015-08-06 DIAGNOSIS — S0181XA Laceration without foreign body of other part of head, initial encounter: Secondary | ICD-10-CM | POA: Diagnosis present

## 2015-08-06 DIAGNOSIS — J449 Chronic obstructive pulmonary disease, unspecified: Secondary | ICD-10-CM | POA: Diagnosis present

## 2015-08-06 DIAGNOSIS — R55 Syncope and collapse: Secondary | ICD-10-CM | POA: Diagnosis present

## 2015-08-06 DIAGNOSIS — Z8673 Personal history of transient ischemic attack (TIA), and cerebral infarction without residual deficits: Secondary | ICD-10-CM | POA: Diagnosis not present

## 2015-08-06 DIAGNOSIS — E785 Hyperlipidemia, unspecified: Secondary | ICD-10-CM | POA: Diagnosis present

## 2015-08-06 DIAGNOSIS — I480 Paroxysmal atrial fibrillation: Secondary | ICD-10-CM | POA: Diagnosis present

## 2015-08-06 DIAGNOSIS — S01512A Laceration without foreign body of oral cavity, initial encounter: Secondary | ICD-10-CM | POA: Diagnosis present

## 2015-08-06 DIAGNOSIS — Z87891 Personal history of nicotine dependence: Secondary | ICD-10-CM

## 2015-08-06 DIAGNOSIS — S51811A Laceration without foreign body of right forearm, initial encounter: Secondary | ICD-10-CM | POA: Diagnosis present

## 2015-08-06 DIAGNOSIS — G4089 Other seizures: Secondary | ICD-10-CM | POA: Diagnosis not present

## 2015-08-06 DIAGNOSIS — W19XXXD Unspecified fall, subsequent encounter: Secondary | ICD-10-CM

## 2015-08-06 DIAGNOSIS — Y92009 Unspecified place in unspecified non-institutional (private) residence as the place of occurrence of the external cause: Secondary | ICD-10-CM | POA: Diagnosis not present

## 2015-08-06 DIAGNOSIS — Z7952 Long term (current) use of systemic steroids: Secondary | ICD-10-CM | POA: Diagnosis not present

## 2015-08-06 DIAGNOSIS — I69398 Other sequelae of cerebral infarction: Secondary | ICD-10-CM | POA: Diagnosis not present

## 2015-08-06 DIAGNOSIS — I10 Essential (primary) hypertension: Secondary | ICD-10-CM | POA: Diagnosis present

## 2015-08-06 DIAGNOSIS — R569 Unspecified convulsions: Secondary | ICD-10-CM | POA: Diagnosis not present

## 2015-08-06 DIAGNOSIS — I5032 Chronic diastolic (congestive) heart failure: Secondary | ICD-10-CM | POA: Diagnosis present

## 2015-08-06 DIAGNOSIS — W19XXXS Unspecified fall, sequela: Secondary | ICD-10-CM | POA: Diagnosis not present

## 2015-08-06 DIAGNOSIS — I110 Hypertensive heart disease with heart failure: Secondary | ICD-10-CM | POA: Diagnosis present

## 2015-08-06 DIAGNOSIS — D72829 Elevated white blood cell count, unspecified: Secondary | ICD-10-CM | POA: Diagnosis present

## 2015-08-06 DIAGNOSIS — N400 Enlarged prostate without lower urinary tract symptoms: Secondary | ICD-10-CM | POA: Diagnosis present

## 2015-08-06 DIAGNOSIS — Z7901 Long term (current) use of anticoagulants: Secondary | ICD-10-CM

## 2015-08-06 DIAGNOSIS — M8580 Other specified disorders of bone density and structure, unspecified site: Secondary | ICD-10-CM | POA: Diagnosis present

## 2015-08-06 DIAGNOSIS — S0101XA Laceration without foreign body of scalp, initial encounter: Secondary | ICD-10-CM | POA: Diagnosis not present

## 2015-08-06 HISTORY — DX: Gastro-esophageal reflux disease without esophagitis: K21.9

## 2015-08-06 HISTORY — DX: Reserved for inherently not codable concepts without codable children: IMO0001

## 2015-08-06 HISTORY — DX: Cerebral infarction, unspecified: I63.9

## 2015-08-06 LAB — URINALYSIS, ROUTINE W REFLEX MICROSCOPIC
Bilirubin Urine: NEGATIVE
Glucose, UA: 250 mg/dL — AB
Ketones, ur: NEGATIVE mg/dL
Leukocytes, UA: NEGATIVE
NITRITE: NEGATIVE
Protein, ur: 30 mg/dL — AB
SPECIFIC GRAVITY, URINE: 1.022 (ref 1.005–1.030)
pH: 5.5 (ref 5.0–8.0)

## 2015-08-06 LAB — CBC WITH DIFFERENTIAL/PLATELET
BASOS PCT: 0 %
Basophils Absolute: 0 10*3/uL (ref 0.0–0.1)
EOS ABS: 0 10*3/uL (ref 0.0–0.7)
Eosinophils Relative: 0 %
HCT: 41.3 % (ref 39.0–52.0)
HEMOGLOBIN: 13 g/dL (ref 13.0–17.0)
LYMPHS PCT: 11 %
Lymphs Abs: 1.9 10*3/uL (ref 0.7–4.0)
MCH: 32 pg (ref 26.0–34.0)
MCHC: 31.5 g/dL (ref 30.0–36.0)
MCV: 101.7 fL — ABNORMAL HIGH (ref 78.0–100.0)
MONOS PCT: 11 %
Monocytes Absolute: 1.9 10*3/uL — ABNORMAL HIGH (ref 0.1–1.0)
NEUTROS PCT: 78 %
Neutro Abs: 13.5 10*3/uL — ABNORMAL HIGH (ref 1.7–7.7)
Platelets: 195 10*3/uL (ref 150–400)
RBC: 4.06 MIL/uL — ABNORMAL LOW (ref 4.22–5.81)
RDW: 14.7 % (ref 11.5–15.5)
WBC: 17.3 10*3/uL — ABNORMAL HIGH (ref 4.0–10.5)

## 2015-08-06 LAB — COMPREHENSIVE METABOLIC PANEL
ALBUMIN: 3.3 g/dL — AB (ref 3.5–5.0)
ALK PHOS: 36 U/L — AB (ref 38–126)
ALT: 22 U/L (ref 17–63)
AST: 19 U/L (ref 15–41)
Anion gap: 8 (ref 5–15)
BUN: 17 mg/dL (ref 6–20)
CALCIUM: 8.7 mg/dL — AB (ref 8.9–10.3)
CO2: 29 mmol/L (ref 22–32)
CREATININE: 0.81 mg/dL (ref 0.61–1.24)
Chloride: 102 mmol/L (ref 101–111)
GFR calc Af Amer: >60 mL/min (ref >60)
GFR calc non Af Amer: >60 mL/min (ref >60)
GLUCOSE: 95 mg/dL (ref 65–99)
Potassium: 3.8 mmol/L (ref 3.5–5.1)
SODIUM: 139 mmol/L (ref 135–145)
Total Bilirubin: 0.6 mg/dL (ref 0.3–1.2)
Total Protein: 6.3 g/dL — ABNORMAL LOW (ref 6.5–8.1)

## 2015-08-06 LAB — URINE MICROSCOPIC-ADD ON
Bacteria, UA: NONE SEEN
WBC, UA: NONE SEEN WBC/hpf (ref 0–5)

## 2015-08-06 LAB — MAGNESIUM: MAGNESIUM: 2.6 mg/dL — AB (ref 1.7–2.4)

## 2015-08-06 LAB — CBG MONITORING, ED: Glucose-Capillary: 109 mg/dL — ABNORMAL HIGH (ref 65–99)

## 2015-08-06 LAB — TROPONIN I: Troponin I: 0.03 ng/mL (ref <0.031)

## 2015-08-06 LAB — CK: Total CK: 258 U/L (ref 49–397)

## 2015-08-06 LAB — PHOSPHORUS: Phosphorus: 3.3 mg/dL (ref 2.5–4.6)

## 2015-08-06 MED ORDER — LIDOCAINE-EPINEPHRINE (PF) 2 %-1:200000 IJ SOLN
10.0000 mL | Freq: Once | INTRAMUSCULAR | Status: AC
  Administered 2015-08-06: 10 mL
  Filled 2015-08-06: qty 20

## 2015-08-06 MED ORDER — ACETAMINOPHEN 650 MG RE SUPP: 650.0000 mg | Freq: Four times a day (QID) | RECTAL | Status: DC | PRN

## 2015-08-06 MED ORDER — PROPAFENONE HCL 225 MG PO TABS
225.0000 mg | ORAL_TABLET | Freq: Two times a day (BID) | ORAL | Status: DC
  Administered 2015-08-06 – 2015-08-08 (×5): 225 mg via ORAL
  Filled 2015-08-06 (×8): qty 1

## 2015-08-06 MED ORDER — GUAIFENESIN ER 600 MG PO TB12
600.0000 mg | ORAL_TABLET | Freq: Two times a day (BID) | ORAL | Status: DC | PRN
  Administered 2015-08-06: 600 mg via ORAL
  Filled 2015-08-06: qty 1

## 2015-08-06 MED ORDER — SODIUM CHLORIDE 0.9 % IV BOLUS (SEPSIS)
1000.0000 mL | Freq: Once | INTRAVENOUS | Status: AC
  Administered 2015-08-06: 1000 mL via INTRAVENOUS

## 2015-08-06 MED ORDER — ACETAMINOPHEN 325 MG PO TABS
650.0000 mg | ORAL_TABLET | Freq: Four times a day (QID) | ORAL | Status: DC | PRN
  Administered 2015-08-06: 650 mg via ORAL
  Filled 2015-08-06: qty 2

## 2015-08-06 MED ORDER — MAGNESIUM OXIDE 400 (241.3 MG) MG PO TABS
400.0000 mg | ORAL_TABLET | Freq: Every day | ORAL | Status: DC
  Administered 2015-08-06 – 2015-08-08 (×3): 400 mg via ORAL
  Filled 2015-08-06 (×3): qty 1

## 2015-08-06 MED ORDER — PANTOPRAZOLE SODIUM 40 MG PO TBEC
40.0000 mg | DELAYED_RELEASE_TABLET | Freq: Every day | ORAL | Status: DC
  Administered 2015-08-06 – 2015-08-08 (×3): 40 mg via ORAL
  Filled 2015-08-06 (×3): qty 1

## 2015-08-06 MED ORDER — CALCIUM CARBONATE-VITAMIN D 500-200 MG-UNIT PO TABS
1.0000 | ORAL_TABLET | Freq: Two times a day (BID) | ORAL | Status: DC
  Administered 2015-08-06 – 2015-08-08 (×5): 1 via ORAL
  Filled 2015-08-06 (×5): qty 1

## 2015-08-06 MED ORDER — SODIUM CHLORIDE 0.9 % IJ SOLN
3.0000 mL | Freq: Two times a day (BID) | INTRAMUSCULAR | Status: DC
  Administered 2015-08-06 – 2015-08-08 (×5): 3 mL via INTRAVENOUS

## 2015-08-06 MED ORDER — VERAPAMIL HCL ER 120 MG PO TBCR
120.0000 mg | EXTENDED_RELEASE_TABLET | Freq: Every day | ORAL | Status: DC
Start: 2015-08-06 — End: 2015-08-08
  Administered 2015-08-06 – 2015-08-08 (×3): 120 mg via ORAL
  Filled 2015-08-06 (×3): qty 1

## 2015-08-06 MED ORDER — CETYLPYRIDINIUM CHLORIDE 0.05 % MT LIQD
7.0000 mL | Freq: Two times a day (BID) | OROMUCOSAL | Status: DC
  Administered 2015-08-06 – 2015-08-08 (×4): 7 mL via OROMUCOSAL

## 2015-08-06 MED ORDER — PREDNISONE 20 MG PO TABS: 20.0000 mg | ORAL_TABLET | Freq: Every day | ORAL | Status: DC

## 2015-08-06 MED ORDER — UMECLIDINIUM-VILANTEROL 62.5-25 MCG/INH IN AEPB
1.0000 | INHALATION_SPRAY | Freq: Every day | RESPIRATORY_TRACT | Status: DC
  Administered 2015-08-08: 1 via RESPIRATORY_TRACT
  Filled 2015-08-06: qty 1

## 2015-08-06 MED ORDER — FUROSEMIDE 20 MG PO TABS
20.0000 mg | ORAL_TABLET | Freq: Every day | ORAL | Status: DC
  Administered 2015-08-06 – 2015-08-08 (×3): 20 mg via ORAL
  Filled 2015-08-06 (×3): qty 1

## 2015-08-06 MED ORDER — APIXABAN 5 MG PO TABS
5.0000 mg | ORAL_TABLET | Freq: Two times a day (BID) | ORAL | Status: DC
  Administered 2015-08-06 – 2015-08-08 (×5): 5 mg via ORAL
  Filled 2015-08-06 (×5): qty 1

## 2015-08-06 MED ORDER — ALFUZOSIN HCL ER 10 MG PO TB24
10.0000 mg | ORAL_TABLET | Freq: Every day | ORAL | Status: DC
Start: 2015-08-06 — End: 2015-08-08
  Administered 2015-08-06 – 2015-08-08 (×3): 10 mg via ORAL
  Filled 2015-08-06 (×3): qty 1

## 2015-08-06 MED ORDER — SODIUM CHLORIDE 0.9 % IV SOLN
500.0000 mg | Freq: Two times a day (BID) | INTRAVENOUS | Status: DC
  Administered 2015-08-06 – 2015-08-08 (×4): 500 mg via INTRAVENOUS
  Filled 2015-08-06 (×6): qty 5

## 2015-08-06 MED ORDER — ALBUTEROL SULFATE (2.5 MG/3ML) 0.083% IN NEBU
2.5000 mg | INHALATION_SOLUTION | RESPIRATORY_TRACT | Status: DC | PRN
  Administered 2015-08-06: 2.5 mg via RESPIRATORY_TRACT
  Filled 2015-08-06: qty 3

## 2015-08-06 MED ORDER — ARFORMOTEROL TARTRATE 15 MCG/2ML IN NEBU
15.0000 ug | INHALATION_SOLUTION | Freq: Two times a day (BID) | RESPIRATORY_TRACT | Status: DC
  Administered 2015-08-06 – 2015-08-08 (×4): 15 ug via RESPIRATORY_TRACT
  Filled 2015-08-06 (×3): qty 2

## 2015-08-06 MED ORDER — PREDNISONE 20 MG PO TABS
20.0000 mg | ORAL_TABLET | Freq: Every day | ORAL | Status: DC
  Administered 2015-08-06: 20 mg via ORAL
  Filled 2015-08-06: qty 1

## 2015-08-06 MED ORDER — ATORVASTATIN CALCIUM 20 MG PO TABS
20.0000 mg | ORAL_TABLET | Freq: Every day | ORAL | Status: DC
  Administered 2015-08-06 – 2015-08-07 (×2): 20 mg via ORAL
  Filled 2015-08-06 (×2): qty 1

## 2015-08-06 NOTE — Consult Note (Signed)
ELECTROPHYSIOLOGY CONSULT NOTE    Patient ID: Mark [name] MRN: [id number], DOB/AGE: May 30, 1941 74 y.o.  Admit date: 08/06/2015 Date of Consult: 08/06/2015  Primary Physician: [name], MD Primary Cardiologist: [name]  Reason for Consultation: syncope  HPI:  Mark [name] is a 74 y.o. male with a past medical history significant for paroxysmal atrial fibrillation (on propafenone and Eliquis), severe COPD, prior CVA, and diastolic heart failure.  He presented to the ER this morning after a fall where he lacerated his head.  He remembers getting up to go to the bathroom and remembers walking out of the bathroom but doesn't remember anything after that.  His wife who was at home was not with him when he fell.  He is not certain if he lost consciousness.  When he was found, his O2 was off.  He has been admitted for further evaluation. With AAD therapy, EP has been asked to evaluate as well.  He denies recent chest pain, his shortness of breath is stable.  He has not had recent fevers, nausea or vomiting. He reports feeling cold since starting Eliquis but has had no bleeding problems. He denies headache, vision changes since fall this morning.   Last echo 12/2014 demonstrated EF 55-60%, grade 1 diastolic dysfunction, LA 33.  Repeat echo pending this admission  Past Medical History  Diagnosis Date  . Atrial fibrillation (HCC)   . Hyperlipidemia   . Weakness   . Dyspnea   . Headache(784.0)   . Osteopenia   . COPD (chronic obstructive pulmonary disease) (HCC)   . Allergic rhinitis   . History of echocardiogram     Echo 5/16:  EF 55-60%, no RWMA, Gr 1 DD  . Incontinence of urine     and stool  . Memory loss   . Confusion   . Numbness     bilateral forearms     Surgical History:  Past Surgical History  Procedure Laterality Date  . Catheter ablation    . Cataract extraction, bilateral    . Appendectomy  1958  . Inguinal hernia repair  1975, 1992  . Video assisted  thoracoscopy (vats)/thorocotomy  08/17/2012    resection / stapling of blebs, mechanical pleurodesis   . Video assisted thoracoscopy  08/17/2012    Procedure: VIDEO ASSISTED THORACOSCOPY;  Surgeon: [name], MD;  Location: [facility] OR;  Service: Thoracic;  Laterality: Right;  . Resection of apical bleb  08/17/2012    Procedure: RESECTION OF APICAL BLEB;  Surgeon: [name], MD;  Location: [facility] OR;  Service: Thoracic;  Laterality: Right;  stapling of bleb  . Video bronchoscopy  08/27/2012    Procedure: VIDEO BRONCHOSCOPY;  Surgeon: [name], MD;  Location: [facility] OR;  Service: Thoracic;  Laterality: N/A;  evaluation of endobronchial valves     Prescriptions prior to admission  Medication Sig Dispense Refill Last Dose  . acetaminophen (TYLENOL) 325 MG tablet Take 650 mg by mouth every 6 (six) hours as needed for mild pain.    Past Month at Unknown time  . albuterol (PROAIR HFA) 108 (90 BASE) MCG/ACT inhaler Inhale 2 puffs into the lungs every 4 (four) hours as needed for wheezing or shortness of breath. For shortness of breath/wheezing 1 Inhaler prn Past Week at Unknown time  . albuterol (PROVENTIL) (2.5 MG/3ML) 0.083% nebulizer solution USE ONE VIAL VIA NEBULIZER 4 TIMES DAILY (Patient taking differently: USE ONE VIAL VIA NEBULIZER 4 TIMES DAILY as needed for wheezing or shortness of  breath) 120 mL 4 Past Month at Unknown time  . alendronate (FOSAMAX) 70 MG tablet Take 70 mg by mouth every 7 (seven) days. Takes on Tuesday Take with a full glass of water on an empty stomach.   07/31/2015 at Unknown time  . alfuzosin (UROXATRAL) 10 MG 24 hr tablet Take 1 tablet by mouth daily.  11 08/05/2015 at Unknown time  . apixaban (ELIQUIS) 5 MG TABS tablet Take 1 tablet (5 mg total) by mouth 2 (two) times daily. 60 tablet 11 08/05/2015 at 1800  . arformoterol (BROVANA) 15 MCG/2ML NEBU Take 2 mLs (15 mcg total) by nebulization 2 (two) times daily. 32 mL 0 08/05/2015 at Unknown time  . atorvastatin  (LIPITOR) 20 MG tablet Take 1 tablet (20 mg total) by mouth daily. 90 tablet 3 08/05/2015 at Unknown time  . Calcium-Magnesium-Vitamin D (ONE-A-DAY CALCIUM PLUS) 500-50-100 MG-MG-UNIT CHEW Chew 1 tablet by mouth 2 (two) times daily.     08/05/2015 at Unknown time  . furosemide (LASIX) 20 MG tablet TAKE 1 TABLET (20 MG TOTAL) BY MOUTH EVERY OTHER DAY. 15 tablet 11 08/05/2015 at Unknown time  . guaiFENesin (MUCINEX) 600 MG 12 hr tablet Take 1-2 mg by mouth every 12 (twelve) hours as needed. For cough   08/05/2015 at Unknown time  . magnesium oxide (MAG-OX) 400 MG tablet Take 1 tablet (400 mg total) by mouth daily.   08/05/2015 at Unknown time  . Multiple Vitamins-Minerals (CENTRUM SILVER PO) Take 1 tablet by mouth daily.     08/05/2015 at Unknown time  . omeprazole (PRILOSEC) 20 MG capsule Take 1 capsule (20 mg total) by mouth 2 (two) times daily. 60 capsule 1 08/05/2015 at Unknown time  . predniSONE (DELTASONE) 20 MG tablet TAKE 1 TABLET (20 MG TOTAL) BY MOUTH DAILY. 30 tablet 5 08/05/2015 at Unknown time  . promethazine-codeine (PHENERGAN WITH CODEINE) 6.25-10 MG/5ML syrup TAKE 5 ML EVERY 6 HOURS AS NEEDED FOR COUGH 200 mL 0 Past Week at Unknown time  . propafenone (RYTHMOL) 225 MG tablet TAKE ONE TABLET BY MOUTH TWICE A DAY 180 tablet 3 08/05/2015 at Unknown time  . Umeclidinium-Vilanterol (ANORO ELLIPTA) 62.5-25 MCG/INH AEPB Inhale 1 puff into the lungs daily. RINSE MOUTH WELL AFTER USE 3 each 3 08/05/2015 at Unknown time  . verapamil (CALAN-SR) 240 MG CR tablet Take 0.5 tablets (120 mg total) by mouth daily. 15 tablet 11 08/05/2015 at Unknown time  . amoxicillin-clavulanate (AUGMENTIN) 875-125 MG tablet Take 1 tablet by mouth 2 (two) times daily. (Patient not taking: Reported on 08/06/2015) 14 tablet 1   . EPINEPHrine 0.3 mg/0.3 mL IJ SOAJ injection Inject 0.3 mLs (0.3 mg total) into the muscle once. For severe shortness of breath 1 Device prn unknown at unknown  . Nebulizers (COMPRESSOR/NEBULIZER)  MISC Use as directed (Patient not taking: Reported on 08/06/2015) 1 each 0 Taking    Inpatient Medications:  . levETIRAcetam  500 mg Intravenous Q12H  . sodium chloride  1,000 mL Intravenous Once    Allergies:  Allergies  Allergen Reactions  . Shrimp [Shellfish Allergy] Diarrhea and Nausea And Vomiting  . Zolpidem Tartrate     disoriented    Social History   Social History  . Marital Status: Married    Spouse Name: Mark [name]  . Number of Children: 6  . Years of Education: 8   Occupational History  . Retired     Air traffic controller   Social History Main Topics  . Smoking status: Former Smoker --  3.00 packs/day for 15 years    Types: Cigarettes  . Smokeless tobacco: Former Neurosurgeon    Quit date: 09/01/1965  . Alcohol Use: No  . Drug Use: No  . Sexual Activity: Not on file   Other Topics Concern  . Not on file   Social History Narrative   Married, 6 children   Right handed   Caffeine use - soda 2 daily     Family History  Problem Relation Age of Onset  . Heart attack Father   . COPD Father   . Lung cancer Father   . COPD Brother   . COPD Sister   . Stroke Mother   . Hypertension Brother   . Throat cancer Brother      Review of Systems: All other systems reviewed and are otherwise negative except as noted above.  Physical Exam: Filed Vitals:   08/06/15 0939 08/06/15 1000 08/06/15 1200 08/06/15 1257  BP:  115/72  122/77  Pulse: 100 101  133  Temp:    97.4 F (36.3 C)  TempSrc:    Oral  Resp: 16 20  20   Height:   5\' 11"  (1.803 m)   Weight:   162 lb 12.8 oz (73.846 kg)   SpO2: 98% 93%  99%    GEN- The patient is elderly and chronically ill appearing, alert and oriented x 3 today.   HEENT: sutured laceration to forehead; normocephalic; sclera clear, conjunctiva pink; hearing intact; oropharynx clear; neck supple  Lungs- Clear to ausculation bilaterally, decreased breath sounds  Heart- Regular rate and rhythm, no murmurs, rubs or gallops   GI- soft, non-tender, non-distended, bowel sounds present  Extremities- no clubbing, cyanosis, or edema; DP/PT/radial pulses 2+ bilaterally MS- no significant deformity or atrophy Skin- warm and dry, no rash or lesion Psych- euthymic mood, full affect Neuro- strength and sensation are intact  Labs:   Lab Results  Component Value Date   WBC 17.3* 08/06/2015   HGB 13.0 08/06/2015   HCT 41.3 08/06/2015   MCV 101.7* 08/06/2015   PLT 195 08/06/2015    Recent Labs Lab 08/06/15 0843  NA 139  K 3.8  CL 102  CO2 29  BUN 17  CREATININE 0.81  CALCIUM 8.7*  PROT 6.3*  BILITOT 0.6  ALKPHOS 36*  ALT 22  AST 19  GLUCOSE 95      Radiology/Studies: Dg Chest 2 View 08/06/2015  CLINICAL DATA:  Increasing shortness of breath today. Fall with forehead laceration. EXAM: CHEST  2 VIEW COMPARISON:  Radiographs 05/03/2015 and 11/22/2014. FINDINGS: The heart size and mediastinal contours are stable. There is stable distortion of the right hilum secondary to previous partial right lung resection and associated scarring. There is stable pleural thickening at the right costophrenic angle. Diffuse emphysematous changes are present bilaterally. There is no evidence of superimposed airspace disease, edema, pleural effusion or pneumothorax. Old rib fractures/thoracotomy defects on the right are unchanged. IMPRESSION: Stable chest with emphysema and chronic scarring. No acute findings demonstrated. Electronically Signed   By: Carey Bullocks M.D.   On: 08/06/2015 09:36   Ct Head Wo Contrast 08/06/2015  CLINICAL DATA:  Syncope this morning.  Hit forehead. EXAM: CT HEAD WITHOUT CONTRAST CT CERVICAL SPINE WITHOUT CONTRAST TECHNIQUE: Multidetector CT imaging of the head and cervical spine was performed following the standard protocol without intravenous contrast. Multiplanar CT image reconstructions of the cervical spine were also generated. COMPARISON:  MRI 01/11/2015 FINDINGS: CT HEAD FINDINGS Old right frontal  infarct with encephalomalacia.  Mild chronic small vessel disease throughout the deep white matter. Age related volume loss. No acute infarction, hemorrhage or hydrocephalus. No mass lesion or midline shift. No acute calvarial abnormality. Visualized paranasal sinuses and mastoids clear. Orbital soft tissues unremarkable. CT CERVICAL SPINE FINDINGS Severe diffuse degenerative disc disease throughout the cervical spine. Mild to moderate diffuse degenerative facet disease bilaterally. Loss of normal cervical lordosis. Slight anterolisthesis of C2 on C3 of 2 mm, likely related to facet disease. No visible fracture. No epidural or paraspinal hematoma. Carotid artery calcifications bilaterally. 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  No acute intracranial abnormality. Loss of normal cervical lordosis. Degenerative changes in the cervical spine as above. No acute bony abnormality. Electronically Signed   By: Charlett NoseKevin  Dover M.D.   On: 08/06/2015 07:30    ZOX:WRUEAEKG:sinus rhythm, PAC, rate 96, normal intervals  Assessment/Plan: 1.  Fall, likely syncope The patient had a traumatic fall with possible syncope. Without prodrome, there is concern for arrhythmic cause.  He also has severe COPD with possible respiratory cause as he was found without oxygen on. As this is his first episode of syncope, could consider watchful waiting vs implantable loop recorder. Dr Graciela Husbandsklein to see today  2.  Paroxysmal atrial fibrillation/Prior CVA Predominately maintaining SR by symptoms Continue Eliquis for CHADS2VASC of at least 3  Dr Graciela HusbandsKlein to see later today  Signed, Gypsy BalsamAmber Seiler, NP 08/06/2015 12:59 PM  Hx and PE reviwed and amended as necessary    A/P  Syncope  PAF on anticoagulation and antiarrhythmic therapy  Prior CVA  COPD  O2 dependent  Leukocytosis  On steroids-chronically  Tachycardia    74 yo male with severe O2 dependent COPD and normal EF with PAF with episode of syncope  The history was reviewed; his wife  says that this morning upon her arrival, he had no recollection as to whether he had been to the bathroom or not. Hence, a specific trigger like post micturition cannot be invoked ; however, it is notable that he has a history of post micturition/defecation lightheadednessr  I am impressed at his tachycardia; I don't think is related to his syncopal event. We will get all echocardiograms it may well represent MAT based on the telemetry. Therapy with verapamil may be useful.   I don't think there is a role for event recorder in this man with an isolated episode of syncope. If his LV function is normal, no further cardiac workup is indicated. We have discussed the role of orthostatic triggers and the importance of moving slowly. An abdominal binder may be of some utility.  There is an evaluation ongoing for neurological cause; I think, based on the history that I have, that this would be exceedingly unlikely. In line with this is the fact that his EEG is normal. His MRI shows prior infarct  In this regard, is also important that he be maintained, as he has, on his apixaban not withstanding his fall.  Needs orthostatic VS

## 2015-08-06 NOTE — ED Notes (Signed)
Patient transported to EEG.  EEG reports they will transport from EEG to inpt room.

## 2015-08-06 NOTE — ED Notes (Signed)
Head sutures completed by Cartner PA.

## 2015-08-06 NOTE — H&P (Signed)
Family Medicine Teaching [facility] Admission History and Physical Service Pager: [phone]  Patient name: Mark [name] Medical record number: [id number] Date of birth: 1941-06-12 Age: 74 y.o. Gender: male  Primary Care Provider: [name], MD Consultants: neuro and cards Code Status: full  Chief Complaint: fall  Assessment and Plan: Mark [name] is a 74 y.o. male presenting with fall . PMH is significant for COPD, afib, CVA.  # Fall/syncope: LOC w/o prodrome, multiple injuries including tongue lac concerning for seizure, no incontinence, also with arrhythmia history - admit to inpatient, [place] attending - monitor on telemetry - echo 01/18/15 with normal EF and grade 1 diastolic dysfunction, repeat - EKG with sinus rhythm with PACs, repeat in am - check troponin - 1L NS bolus - consult neurology and cardiology (patient sees Roda Shutters and [name] respectively), appreciate their recommendations on further work-up - PT/OT  # COPD: on home 02 and chronic steroids, reports cough/congestion x2 weeks, ED CXR without acute findings - continue home brovana, albuterol, prednisone, umeclidinium-vilanterol - mucinex prn cough - if increased 02 requirement consider increasing steroids and adding antibiotic for exacerbation, will hold off for now  # Atrial fibrillation: current sinus rhythm - continue home propafenone, verapamil, eliquis - cardiology consulted as above  # BPH - continue alfuzosin  FEN/GI: heart healthy diet, SLIV Prophylaxis: on eliquis  Disposition: admit to inpatient for syncope evaluation  History of Present Illness:  Mark [name] is a 74 y.o. male presenting with fall/syncope. Patient reports he was walking to the bathroom and suddenly lost consciousness. He denies any dizziness, lightheadedness, weakness, sweating or visual changes leading up to the fall. He woke up (he estimates 5 minutes later but has no real way of knowing how long he was out) with  blood all around him. His wife did not hear him fall or call out but found him awake and lying in his blood, she asked him what happened and he replied that he didn't know. She got him up and held pressure on his head wound while waiting for EMS to arrive. She does not recall him being confused or sleepy after she found him but he has some baseline confusion since his stroke in May. He denies any chest pain or palpitations leading up to the fall and reports that he sometimes has palpitations when he is in afib but not always. He reports a "cold" for the past 2 weeks with runny nose and cough but says he has been drinking normally and peeing a lot so he does not think he is dehydrated.   Cardiologist - [name] Neurologist - Roda Shutters  Review Of Systems: Per HPI with the following additions: no fevers Otherwise the remainder of the systems were negative.  Patient Active Problem List   Diagnosis Date Noted  . Fall 08/06/2015  . COPD, severe (HCC) 04/06/2015  . CVA (cerebral vascular accident) (HCC) 01/23/2015  . Hyperglycemia 01/23/2015  . Cerebral infarction due to embolism of right middle cerebral artery (HCC) 01/23/2015  . Numbness and tingling in right hand 12/14/2012  . Clotted chest tube 10/30/2012  . Lung mass 10/07/2012  . S/P thoracotomy 08/31/2012  . Spontaneous pneumothorax 08/10/2012  . TREMOR 10/16/2009  . HLD (hyperlipidemia) 10/15/2009  . Atrial fibrillation (HCC) 10/15/2009  . WEAKNESS 09/14/2009  . DYSPNEA 10/12/2008  . Seasonal and perennial allergic rhinitis 12/24/2007  . COPD with chronic bronchitis-oxygen dependent 12/24/2007  . OSTEOPENIA 12/24/2007  . Headache(784.0) 12/24/2007    Past Medical History: Past Medical History  Diagnosis Date  . Atrial fibrillation (HCC)   . Hyperlipidemia   . Weakness   . Dyspnea   . Headache(784.0)   . Osteopenia   . COPD (chronic obstructive pulmonary disease) (HCC)   . Allergic rhinitis   . History of echocardiogram     Echo  5/16:  EF 55-60%, no RWMA, Gr 1 DD  . Incontinence of urine     and stool  . Memory loss   . Confusion   . Numbness     bilateral forearms    Past Surgical History: Past Surgical History  Procedure Laterality Date  . Catheter ablation    . Cataract extraction, bilateral    . Appendectomy  1958  . Inguinal hernia repair  1975, 1992  . Video assisted thoracoscopy (vats)/thorocotomy  08/17/2012    resection / stapling of blebs, mechanical pleurodesis   . Video assisted thoracoscopy  08/17/2012    Procedure: VIDEO ASSISTED THORACOSCOPY;  Surgeon: [name], MD;  Location[facility] OR;  Service: Thoracic;  Laterality: Right;  . Resection of apical bleb  08/17/2012    Procedure: RESECTION OF APICAL BLEB;  Surgeon: [name], MD;  Location: [facility] OR;  Service: Thoracic;  Laterality: Right;  stapling of bleb  . Video bronchoscopy  08/27/2012    Procedure: VIDEO BRONCHOSCOPY;  Surgeon: [name], MD;  Location: [facility] OR;  Service: Thoracic;  Laterality: N/A;  evaluation of endobronchial valves    Social History: Social History  Substance Use Topics  . Smoking status: Former Smoker -- 3.00 packs/day for 15 years    Types: Cigarettes  . Smokeless tobacco: Former Neurosurgeon    Quit date: 09/01/1965  . Alcohol Use: No   Please also refer to relevant sections of EMR.  Family History: Family History  Problem Relation Age of Onset  . Heart attack Father   . COPD Father   . Lung cancer Father   . COPD Brother   . COPD Sister   . Stroke Mother   . Hypertension Brother   . Throat cancer Brother    Allergies and Medications: Allergies  Allergen Reactions  . Shrimp [Shellfish Allergy] Diarrhea and Nausea And Vomiting  . Zolpidem Tartrate     disoriented   No current facility-administered medications on file prior to encounter.   Current Outpatient Prescriptions on File Prior to Encounter  Medication Sig Dispense Refill  . acetaminophen (TYLENOL) 325 MG tablet Take 650 mg by  mouth every 6 (six) hours as needed for mild pain.     Marland Kitchen albuterol (PROAIR HFA) 108 (90 BASE) MCG/ACT inhaler Inhale 2 puffs into the lungs every 4 (four) hours as needed for wheezing or shortness of breath. For shortness of breath/wheezing 1 Inhaler prn  . albuterol (PROVENTIL) (2.5 MG/3ML) 0.083% nebulizer solution USE ONE VIAL VIA NEBULIZER 4 TIMES DAILY (Patient taking differently: USE ONE VIAL VIA NEBULIZER 4 TIMES DAILY as needed for wheezing or shortness of breath) 120 mL 4  . alendronate (FOSAMAX) 70 MG tablet Take 70 mg by mouth every 7 (seven) days. Takes on Tuesday Take with a full glass of water on an empty stomach.    Marland Kitchen alfuzosin (UROXATRAL) 10 MG 24 hr tablet Take 1 tablet by mouth daily.  11  . apixaban (ELIQUIS) 5 MG TABS tablet Take 1 tablet (5 mg total) by mouth 2 (two) times daily. 60 tablet 11  . arformoterol (BROVANA) 15 MCG/2ML NEBU Take 2 mLs (15 mcg total) by nebulization 2 (two) times  daily. 32 mL 0  . atorvastatin (LIPITOR) 20 MG tablet Take 1 tablet (20 mg total) by mouth daily. 90 tablet 3  . Calcium-Magnesium-Vitamin D (ONE-A-DAY CALCIUM PLUS) 500-50-100 MG-MG-UNIT CHEW Chew 1 tablet by mouth 2 (two) times daily.      . furosemide (LASIX) 20 MG tablet TAKE 1 TABLET (20 MG TOTAL) BY MOUTH EVERY OTHER DAY. 15 tablet 11  . guaiFENesin (MUCINEX) 600 MG 12 hr tablet Take 1-2 mg by mouth every 12 (twelve) hours as needed. For cough    . magnesium oxide (MAG-OX) 400 MG tablet Take 1 tablet (400 mg total) by mouth daily.    . Multiple Vitamins-Minerals (CENTRUM SILVER PO) Take 1 tablet by mouth daily.      Marland Kitchen omeprazole (PRILOSEC) 20 MG capsule Take 1 capsule (20 mg total) by mouth 2 (two) times daily. 60 capsule 1  . predniSONE (DELTASONE) 20 MG tablet TAKE 1 TABLET (20 MG TOTAL) BY MOUTH DAILY. 30 tablet 5  . promethazine-codeine (PHENERGAN WITH CODEINE) 6.25-10 MG/5ML syrup TAKE 5 ML EVERY 6 HOURS AS NEEDED FOR COUGH 200 mL 0  . propafenone (RYTHMOL) 225 MG tablet TAKE ONE  TABLET BY MOUTH TWICE A DAY 180 tablet 3  . Umeclidinium-Vilanterol (ANORO ELLIPTA) 62.5-25 MCG/INH AEPB Inhale 1 puff into the lungs daily. RINSE MOUTH WELL AFTER USE 3 each 3  . verapamil (CALAN-SR) 240 MG CR tablet Take 0.5 tablets (120 mg total) by mouth daily. 15 tablet 11  . amoxicillin-clavulanate (AUGMENTIN) 875-125 MG tablet Take 1 tablet by mouth 2 (two) times daily. (Patient not taking: Reported on 08/06/2015) 14 tablet 1  . EPINEPHrine 0.3 mg/0.3 mL IJ SOAJ injection Inject 0.3 mLs (0.3 mg total) into the muscle once. For severe shortness of breath 1 Device prn  . Nebulizers (COMPRESSOR/NEBULIZER) MISC Use as directed (Patient not taking: Reported on 08/06/2015) 1 each 0    Objective: BP 115/72 mmHg  Pulse 101  Temp(Src) 98.3 F (36.8 C) (Rectal)  Resp 20  SpO2 93% Exam: General: elderly male, crusted blood on head and arms, NAD Eyes: PERRL, arcus senilis ENTM: MMM, 2cm central forehead lac, large hematoma on right side of tongue and blood in mouth Neck: supple, nontender Cardiovascular: distant heart sounds but RRR, no MRG Respiratory: [place] in place, slightly increase work of breathing with movement, poor air movement throughout but otherwise CTAB, no wheezes or crackles appreciated Abdomen: mild epigastric tenderness, soft, ND, no HSM MSK: 2cm lacerations on bilateral upper arms, bruising on bilateral arms and legs Skin: thin and dry, no rashes, lacs as above Neuro: alert and oriented, no focal deficits, normal strength and sensation throughout Psych: normal affect and behavior  Labs and Imaging: CBC BMET   Recent Labs Lab 08/06/15 0805  WBC 17.3*  HGB 13.0  HCT 41.3  PLT 195    Recent Labs Lab 08/06/15 0843  NA 139  K 3.8  CL 102  CO2 29  BUN 17  CREATININE 0.81  GLUCOSE 95  CALCIUM 8.7*     Dg Chest 2 View  08/06/2015  CLINICAL DATA:  Increasing shortness of breath today. Fall with forehead laceration. EXAM: CHEST  2 VIEW COMPARISON:  Radiographs  05/03/2015 and 11/22/2014. FINDINGS: The heart size and mediastinal contours are stable. There is stable distortion of the right hilum secondary to previous partial right lung resection and associated scarring. There is stable pleural thickening at the right costophrenic angle. Diffuse emphysematous changes are present bilaterally. There is no evidence of superimposed airspace disease,  edema, pleural effusion or pneumothorax. Old rib fractures/thoracotomy defects on the right are unchanged. IMPRESSION: Stable chest with emphysema and chronic scarring. No acute findings demonstrated. Electronically Signed   By: Carey BullocksWilliam  Veazey M.D.   On: 08/06/2015 09:36   Ct Head Wo Contrast  08/06/2015  CLINICAL DATA:  Syncope this morning.  Hit forehead. EXAM: CT HEAD WITHOUT CONTRAST CT CERVICAL SPINE WITHOUT CONTRAST TECHNIQUE: Multidetector CT imaging of the head and cervical spine was performed following the standard protocol without intravenous contrast. Multiplanar CT image reconstructions of the cervical spine were also generated. COMPARISON:  MRI 01/11/2015 FINDINGS: CT HEAD FINDINGS Old right frontal infarct with encephalomalacia. Mild chronic small vessel disease throughout the deep white matter. Age related volume loss. No acute infarction, hemorrhage or hydrocephalus. No mass lesion or midline shift. No acute calvarial abnormality. Visualized paranasal sinuses and mastoids clear. Orbital soft tissues unremarkable. CT CERVICAL SPINE FINDINGS Severe diffuse degenerative disc disease throughout the cervical spine. Mild to moderate diffuse degenerative facet disease bilaterally. Loss of normal cervical lordosis. Slight anterolisthesis of C2 on C3 of 2 mm, likely related to facet disease. No visible fracture. No epidural or paraspinal hematoma. Carotid artery calcifications bilaterally. 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  No acute intracranial abnormality. Loss of normal cervical lordosis. Degenerative changes in the  cervical spine as above. No acute bony abnormality. Electronically Signed   By: Charlett NoseKevin  Dover M.D.   On: 08/06/2015 07:30   Ct Cervical Spine Wo Contrast  08/06/2015  CLINICAL DATA:  Syncope this morning.  Hit forehead. EXAM: CT HEAD WITHOUT CONTRAST CT CERVICAL SPINE WITHOUT CONTRAST TECHNIQUE: Multidetector CT imaging of the head and cervical spine was performed following the standard protocol without intravenous contrast. Multiplanar CT image reconstructions of the cervical spine were also generated. COMPARISON:  MRI 01/11/2015 FINDINGS: CT HEAD FINDINGS Old right frontal infarct with encephalomalacia. Mild chronic small vessel disease throughout the deep white matter. Age related volume loss. No acute infarction, hemorrhage or hydrocephalus. No mass lesion or midline shift. No acute calvarial abnormality. Visualized paranasal sinuses and mastoids clear. Orbital soft tissues unremarkable. CT CERVICAL SPINE FINDINGS Severe diffuse degenerative disc disease throughout the cervical spine. Mild to moderate diffuse degenerative facet disease bilaterally. Loss of normal cervical lordosis. Slight anterolisthesis of C2 on C3 of 2 mm, likely related to facet disease. No visible fracture. No epidural or paraspinal hematoma. Carotid artery calcifications bilaterally. 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  No acute intracranial abnormality. Loss of normal cervical lordosis. Degenerative changes in the cervical spine as above. No acute bony abnormality. Electronically Signed   By: Charlett NoseKevin  Dover M.D.   On: 08/06/2015 07:30     Abram SanderElena M Ravinder Hofland, MD 08/06/2015, 10:36 AM PGY-3, [facility] FPTS Intern pager: [phone], text pages welcome

## 2015-08-06 NOTE — ED Provider Notes (Addendum)
MSE was initiated and I personally evaluated the patient and placed orders (if any) at  6:53 AM on August 06, 2015.  The patient appears stable so that the remainder of the MSE may be completed by another provider.  He apparently fell going to the bathroom and struck his forehead causing laceration. He also suffered skin tears to his left elbow and right forearm. He is being sent for CT of head and cervical spine.  Dione Boozeavid Kierah Goatley, MD 08/06/15 [phone]  Per old records, TDaP was given in September 2012, so he does not need tetanus booster today.  Dione Boozeavid Alexandr Yaworski, MD 08/06/15 [phone]

## 2015-08-06 NOTE — Consult Note (Signed)
Reason for Consult:   Possible syncopal episode  Requesting Physician: [facility] Primary Cardiologist Dr [name]  HPI:   74 y.o. Male with a history of paroxysmal atrial fibrillation in the setting of severe COPD. He has taken propafenone in the past. 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 He has severe COPD and is on chronic steroids an continuous O2. His last EF was 55-60% by May 2016. Hehas had spells where his O2 sat machine reported heart rates into the 40s. The pt had an event recorder in May 2015 that demonstrated only sinus rhythm with some PVCs and a pattern of bigeminy.            He is in there ED now after he fell at home. The p ttells me he got up to use the BR around 5:30 am. He thinks he got back to bed but then fell forward. He was found by his wife. He had suffered a laceration to his forehead and skin tears to his Lt elbow and Rt forearm. He denies any palpitations. He says he doesn't have very clear recollection of event surrounding this episode. EKG in ED shows NSR, telemetry -NSR with PACs.   PMHx:  Past Medical History  Diagnosis Date  . Atrial fibrillation (HCC)   . Hyperlipidemia   . Weakness   . Dyspnea   . Headache(784.0)   . Osteopenia   . COPD (chronic obstructive pulmonary disease) (HCC)   . Allergic rhinitis   . History of echocardiogram     Echo 5/16:  EF 55-60%, no RWMA, Gr 1 DD  . Incontinence of urine     and stool  . Memory loss   . Confusion   . Numbness     bilateral forearms    Past Surgical History  Procedure Laterality Date  . Catheter ablation    . Cataract extraction, bilateral    . Appendectomy  1958  . Inguinal hernia repair  1975, 1992  . Video assisted thoracoscopy (vats)/thorocotomy  08/17/2012    resection / stapling of blebs, mechanical pleurodesis   . Video assisted thoracoscopy  08/17/2012    Procedure: VIDEO ASSISTED THORACOSCOPY;  Surgeon:  [name], MD;  Location: [facility] OR;  Service: Thoracic;  Laterality: Right;  . Resection of apical bleb  08/17/2012    Procedure: RESECTION OF APICAL BLEB;  Surgeon: [name], MD;  Location: [facility] OR;  Service: Thoracic;  Laterality: Right;  stapling of bleb  . Video bronchoscopy  08/27/2012    Procedure: VIDEO BRONCHOSCOPY;  Surgeon: [name], MD;  Location: [facility] OR;  Service: Thoracic;  Laterality: N/A;  evaluation of endobronchial valves    SOCHx:  reports that he has quit smoking. His smoking use included Cigarettes. He has a 45 pack-year smoking history. He quit smokeless tobacco use about 49 years ago. He reports that he does not drink alcohol or use illicit drugs.Lives with his wife  FAMHx: Family History  Problem Relation Age of Onset  . Heart attack Father   . COPD Father   . Lung cancer Father   . COPD Brother   . COPD Sister   . Stroke Mother   . Hypertension Brother   . Throat cancer Brother     ALLERGIES: Allergies  Allergen Reactions  . Shrimp [Shellfish Allergy] Diarrhea and Nausea And Vomiting  . Zolpidem Tartrate  disoriented    ROS: Review of Systems: General: negative for chills, fever, night sweats or weight changes.  Cardiovascular: negative for chest pain,  palpitations, paroxysmal nocturnal dyspnea  HEENT: negative for any visual disturbances, blindness, glaucoma Dermatological: negative for rash Respiratory: chronic SOB, continuous O2 Urologic: negative for hematuria or dysuria Abdominal: negative for nausea, vomiting, diarrhea, bright red blood per rectum, melena, or hematemesis Neurologic: negative for visual changes, syncope, or dizziness Musculoskeletal: negative for back pain, joint pain, or swelling Psych: cooperative and appropriate All other systems reviewed and are otherwise negative except as noted above.   HOME MEDICATIONS: Prior to Admission medications   Medication Sig Start Date End Date Taking? Authorizing Provider    acetaminophen (TYLENOL) 325 MG tablet Take 650 mg by mouth every 6 (six) hours as needed for mild pain.    Yes Historical Provider, MD  albuterol (PROAIR HFA) 108 (90 BASE) MCG/ACT inhaler Inhale 2 puffs into the lungs every 4 (four) hours as needed for wheezing or shortness of breath. For shortness of breath/wheezing 07/26/15  Yes Waymon Budgelinton D Young, MD  albuterol (PROVENTIL) (2.5 MG/3ML) 0.083% nebulizer solution USE ONE VIAL VIA NEBULIZER 4 TIMES DAILY Patient taking differently: USE ONE VIAL VIA NEBULIZER 4 TIMES DAILY as needed for wheezing or shortness of breath 07/13/14  Yes Waymon Budgelinton D Young, MD  alendronate (FOSAMAX) 70 MG tablet Take 70 mg by mouth every 7 (seven) days. Takes on Tuesday Take with a full glass of water on an empty stomach.   Yes Historical Provider, MD  alfuzosin (UROXATRAL) 10 MG 24 hr tablet Take 1 tablet by mouth daily. 07/11/15  Yes Historical Provider, MD  apixaban (ELIQUIS) 5 MG TABS tablet Take 1 tablet (5 mg total) by mouth 2 (two) times daily. 01/12/15  Yes Scott T Alben SpittleWeaver, PA-C  arformoterol (BROVANA) 15 MCG/2ML NEBU Take 2 mLs (15 mcg total) by nebulization 2 (two) times daily. 07/26/15  Yes Waymon Budgelinton D Young, MD  atorvastatin (LIPITOR) 20 MG tablet Take 1 tablet (20 mg total) by mouth daily. 01/25/15  Yes Marvel PlanJindong Xu, MD  Calcium-Magnesium-Vitamin D (ONE-A-DAY CALCIUM PLUS) 500-50-100 MG-MG-UNIT CHEW Chew 1 tablet by mouth 2 (two) times daily.     Yes Historical Provider, MD  furosemide (LASIX) 20 MG tablet TAKE 1 TABLET (20 MG TOTAL) BY MOUTH EVERY OTHER DAY. 05/14/15  Yes Duke SalviaSteven C Klein, MD  guaiFENesin (MUCINEX) 600 MG 12 hr tablet Take 1-2 mg by mouth every 12 (twelve) hours as needed. For cough   Yes Historical Provider, MD  magnesium oxide (MAG-OX) 400 MG tablet Take 1 tablet (400 mg total) by mouth daily. 09/02/11  Yes Duke SalviaSteven C Klein, MD  Multiple Vitamins-Minerals (CENTRUM SILVER PO) Take 1 tablet by mouth daily.     Yes Historical Provider, MD  omeprazole (PRILOSEC) 20  MG capsule Take 1 capsule (20 mg total) by mouth 2 (two) times daily. 11/04/12  Yes Glori LuisEric G Sonnenberg, MD  predniSONE (DELTASONE) 20 MG tablet TAKE 1 TABLET (20 MG TOTAL) BY MOUTH DAILY. 06/26/15  Yes Waymon Budgelinton D Young, MD  promethazine-codeine (PHENERGAN WITH CODEINE) 6.25-10 MG/5ML syrup TAKE 5 ML EVERY 6 HOURS AS NEEDED FOR COUGH 07/30/15  Yes Waymon Budgelinton D Young, MD  propafenone (RYTHMOL) 225 MG tablet TAKE ONE TABLET BY MOUTH TWICE A DAY 01/24/15  Yes Scott T Weaver, PA-C  Umeclidinium-Vilanterol (ANORO ELLIPTA) 62.5-25 MCG/INH AEPB Inhale 1 puff into the lungs daily. RINSE MOUTH WELL AFTER USE 11/22/14  Yes Waymon Budgelinton D Young, MD  verapamil (CALAN-SR) 240 MG  CR tablet Take 0.5 tablets (120 mg total) by mouth daily. 05/14/15  Yes Duke Salvia, MD  amoxicillin-clavulanate (AUGMENTIN) 875-125 MG tablet Take 1 tablet by mouth 2 (two) times daily. Patient not taking: Reported on 08/06/2015 07/26/15   Waymon Budge, MD  EPINEPHrine 0.3 mg/0.3 mL IJ SOAJ injection Inject 0.3 mLs (0.3 mg total) into the muscle once. For severe shortness of breath 07/24/14   Waymon Budge, MD  Nebulizers (COMPRESSOR/NEBULIZER) MISC Use as directed Patient not taking: Reported on 08/06/2015 06/16/13   Waymon Budge, MD    HOSPITAL MEDICATIONS: I have reviewed the patient's current medications.  VITALS: Blood pressure 115/72, pulse 101, temperature 98.3 F (36.8 C), temperature source Rectal, resp. rate 20, SpO2 93 %.  PHYSICAL EXAM: General appearance: alert, cooperative, no distress and chronically ill, on O2, forehead laceration Neck: no carotid bruit and no JVD Lungs: decreased breath sounds Heart: regular rate and rhythm Abdomen: soft, non-tender; bowel sounds normal; no masses,  no organomegaly Extremities: no edema Pulses: 2+ and symmetric Skin: Skin color, texture, turgor normal. No rashes or lesions Neurologic: Grossly normal  LABS: Results for orders placed or performed during the hospital encounter of  08/06/15 (from the past 24 hour(s))  CBC with Differential     Status: Abnormal   Collection Time: 08/06/15  8:05 AM  Result Value Ref Range   WBC 17.3 (H) 4.0 - 10.5 K/uL   RBC 4.06 (L) 4.22 - 5.81 MIL/uL   Hemoglobin 13.0 13.0 - 17.0 g/dL   HCT 16.1 09.6 - 04.5 %   MCV 101.7 (H) 78.0 - 100.0 fL   MCH 32.0 26.0 - 34.0 pg   MCHC 31.5 30.0 - 36.0 g/dL   RDW 40.9 81.1 - 91.4 %   Platelets 195 150 - 400 K/uL   Neutrophils Relative % 78 %   Lymphocytes Relative 11 %   Monocytes Relative 11 %   Eosinophils Relative 0 %   Basophils Relative 0 %   Neutro Abs 13.5 (H) 1.7 - 7.7 K/uL   Lymphs Abs 1.9 0.7 - 4.0 K/uL   Monocytes Absolute 1.9 (H) 0.1 - 1.0 K/uL   Eosinophils Absolute 0.0 0.0 - 0.7 K/uL   Basophils Absolute 0.0 0.0 - 0.1 K/uL   Smear Review MORPHOLOGY UNREMARKABLE   CBG monitoring, ED     Status: Abnormal   Collection Time: 08/06/15  8:06 AM  Result Value Ref Range   Glucose-Capillary 109 (H) 65 - 99 mg/dL  Comprehensive metabolic panel     Status: Abnormal   Collection Time: 08/06/15  8:43 AM  Result Value Ref Range   Sodium 139 135 - 145 mmol/L   Potassium 3.8 3.5 - 5.1 mmol/L   Chloride 102 101 - 111 mmol/L   CO2 29 22 - 32 mmol/L   Glucose, Bld 95 65 - 99 mg/dL   BUN 17 6 - 20 mg/dL   Creatinine, Ser 7.82 0.61 - 1.24 mg/dL   Calcium 8.7 (L) 8.9 - 10.3 mg/dL   Total Protein 6.3 (L) 6.5 - 8.1 g/dL   Albumin 3.3 (L) 3.5 - 5.0 g/dL   AST 19 15 - 41 U/L   ALT 22 17 - 63 U/L   Alkaline Phosphatase 36 (L) 38 - 126 U/L   Total Bilirubin 0.6 0.3 - 1.2 mg/dL   GFR calc non Af Amer >60 >60 mL/min   GFR calc Af Amer >60 >60 mL/min   Anion gap 8 5 - 15  CK  Status: None   Collection Time: 08/06/15  8:43 AM  Result Value Ref Range   Total CK 258 49 - 397 U/L  Urinalysis, Routine w reflex microscopic     Status: Abnormal   Collection Time: 08/06/15  9:18 AM  Result Value Ref Range   Color, Urine YELLOW YELLOW   APPearance CLOUDY (A) CLEAR   Specific Gravity, Urine  1.022 1.005 - 1.030   pH 5.5 5.0 - 8.0   Glucose, UA 250 (A) NEGATIVE mg/dL   Hgb urine dipstick MODERATE (A) NEGATIVE   Bilirubin Urine NEGATIVE NEGATIVE   Ketones, ur NEGATIVE NEGATIVE mg/dL   Protein, ur 30 (A) NEGATIVE mg/dL   Nitrite NEGATIVE NEGATIVE   Leukocytes, UA NEGATIVE NEGATIVE  Urine microscopic-add on     Status: Abnormal   Collection Time: 08/06/15  9:18 AM  Result Value Ref Range   Squamous Epithelial / LPF 0-5 (A) NONE SEEN   WBC, UA NONE SEEN 0 - 5 WBC/hpf   RBC / HPF 0-5 0 - 5 RBC/hpf   Bacteria, UA NONE SEEN NONE SEEN   Crystals URIC ACID CRYSTALS (A) NEGATIVE    EKG: NSR  IMAGING: Dg Chest 2 View  08/06/2015  CLINICAL DATA:  Increasing shortness of breath today. Fall with forehead laceration. EXAM: CHEST  2 VIEW COMPARISON:  Radiographs 05/03/2015 and 11/22/2014. FINDINGS: The heart size and mediastinal contours are stable. There is stable distortion of the right hilum secondary to previous partial right lung resection and associated scarring. There is stable pleural thickening at the right costophrenic angle. Diffuse emphysematous changes are present bilaterally. There is no evidence of superimposed airspace disease, edema, pleural effusion or pneumothorax. Old rib fractures/thoracotomy defects on the right are unchanged. IMPRESSION: Stable chest with emphysema and chronic scarring. No acute findings demonstrated. Electronically Signed   By: Carey Bullocks M.D.   On: 08/06/2015 09:36   Ct Head Wo Contrast  08/06/2015  CLINICAL DATA:  Syncope this morning.  Hit forehead. EXAM: CT HEAD WITHOUT CONTRAST CT CERVICAL SPINE WITHOUT CONTRAST TECHNIQUE: Multidetector CT imaging of the head and cervical spine was performed following the standard protocol without intravenous contrast. Multiplanar CT image reconstructions of the cervical spine were also generated. COMPARISON:  MRI 01/11/2015 FINDINGS: CT HEAD FINDINGS Old right frontal infarct with encephalomalacia. Mild  chronic small vessel disease throughout the deep white matter. Age related volume loss. No acute infarction, hemorrhage or hydrocephalus. No mass lesion or midline shift. No acute calvarial abnormality. Visualized paranasal sinuses and mastoids clear. Orbital soft tissues unremarkable. CT CERVICAL SPINE FINDINGS Severe diffuse degenerative disc disease throughout the cervical spine. Mild to moderate diffuse degenerative facet disease bilaterally. Loss of normal cervical lordosis. Slight anterolisthesis of C2 on C3 of 2 mm, likely related to facet disease. No visible fracture. No epidural or paraspinal hematoma. Carotid artery calcifications bilaterally. 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  No acute intracranial abnormality. Loss of normal cervical lordosis. Degenerative changes in the cervical spine as above. No acute bony abnormality. Electronically Signed   By: Charlett Nose M.D.   On: 08/06/2015 07:30   Ct Cervical Spine Wo Contrast  08/06/2015  CLINICAL DATA:  Syncope this morning.  Hit forehead. EXAM: CT HEAD WITHOUT CONTRAST CT CERVICAL SPINE WITHOUT CONTRAST TECHNIQUE: Multidetector CT imaging of the head and cervical spine was performed following the standard protocol without intravenous contrast. Multiplanar CT image reconstructions of the cervical spine were also generated. COMPARISON:  MRI 01/11/2015 FINDINGS: CT HEAD FINDINGS Old right frontal infarct  with encephalomalacia. Mild chronic small vessel disease throughout the deep white matter. Age related volume loss. No acute infarction, hemorrhage or hydrocephalus. No mass lesion or midline shift. No acute calvarial abnormality. Visualized paranasal sinuses and mastoids clear. Orbital soft tissues unremarkable. CT CERVICAL SPINE FINDINGS Severe diffuse degenerative disc disease throughout the cervical spine. Mild to moderate diffuse degenerative facet disease bilaterally. Loss of normal cervical lordosis. Slight anterolisthesis of C2 on C3 of 2 mm,  likely related to facet disease. No visible fracture. No epidural or paraspinal hematoma. Carotid artery calcifications bilaterally. 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  No acute intracranial abnormality. Loss of normal cervical lordosis. Degenerative changes in the cervical spine as above. No acute bony abnormality. Electronically Signed   By: Charlett Nose M.D.   On: 08/06/2015 07:30    IMPRESSION: Principal Problem:   Fall- possible syncopal episode Active Problems:   PAF (paroxysmal atrial fibrillation) (HCC)   COPD with chronic bronchitis-oxygen dependent   History of CVA (cerebrovascular accident) 5/16   HLD (hyperlipidemia)   Essential hypertension   RECOMMENDATION: Will observe on telemetry- continue home meds, MD to see.  Time Spent Directly with Patient: 40 minutes  Corine Shelter, [place]  [phone] beeper 08/06/2015, 11:46 AM    Patient examined chart reviewed.  Having MRI post head trauma.  CT with old  Right MCA infarct.  No prodrome to suggest arrhythmia.  ECG with SR.  On propofenone for PAF.  QT/QRS ok.  Exam with marked emphysema and decreased BS throughout especially right lung.  Given CVA earlier this year would be a concern for seizure substrate Suggest possible need for EEG.  Has seen Dr [name] before for ? Bradycardia associated with PAF.  Given this and antiarrhythmic Rx will ask EP to see regarding possible need for loop recorder.  Continue telemetry  [employer]

## 2015-08-06 NOTE — ED Notes (Signed)
Patient stated that he got up this morning and had a loss of consciousness while trying to ambulate to the bathroom. Pt fell and sustained injuries to his forehead and left elbow. Pt is CAO at this time and answering all questions appropriately. Pt's blood sugar is 181, per EMS and A-fib on the monitor with stable vital signs.

## 2015-08-06 NOTE — ED Notes (Signed)
Patient transported to X-ray 

## 2015-08-06 NOTE — Discharge Instructions (Addendum)
You were hospitalized after a fall. The neurologist felt that this fall may have been caused by a seizure, so they would like you to start taking an anti-seizure medication.   Please start taking Keppra 500 mg two times a day. Also, please [facility] [phone]).   Also, please STOP taking Brovana. Now that you have the Anora Ellipta you do not need to take both medications.   Please continue to take the rest of your medications as you were before your hospitalization, and schedule a follow-up appointment with your regular doctor within a week.       Information on my medicine - ELIQUIS (apixaban)  This medication education was reviewed with me or my healthcare representative as part of my discharge preparation.  The pharmacist that spoke with me during my hospital stay was:  Remi HaggardAlyson N Leonard, [facility]  Why was Eliquis prescribed for you? Eliquis was prescribed for you to reduce the risk of a blood clot forming that can cause a stroke if you have a medical condition called atrial fibrillation (a type of irregular heartbeat).  What do You need to know about Eliquis ? Take your Eliquis TWICE DAILY - one tablet in the morning and one tablet in the evening with or without food. If you have difficulty swallowing the tablet whole please discuss with your pharmacist how to take the medication safely.  Take Eliquis exactly as prescribed by your doctor and DO NOT stop taking Eliquis without talking to the doctor who prescribed the medication.  Stopping may increase your risk of developing a stroke.  Refill your prescription before you run out.  After discharge, you should have regular check-up appointments with your healthcare provider that is prescribing your Eliquis.  In the future your dose may need to be changed if your kidney function or weight changes by a significant amount or as you get older.  What do you do  if you miss a dose? If you miss a dose, take it as soon as you remember on the same day and resume taking twice daily.  Do not take more than one dose of ELIQUIS at the same time to make up a missed dose.  Important Safety Information A possible side effect of Eliquis is bleeding. You should call your healthcare provider right away if you experience any of the following: ? Bleeding from an injury or your nose that does not stop. ? Unusual colored urine (red or dark brown) or unusual colored stools (red or black). ? Unusual bruising for unknown reasons. ? A serious fall or if you hit your head (even if there is no bleeding).  Some medicines may interact with Eliquis and might increase your risk of bleeding or clotting while on Eliquis. To help avoid this, consult your healthcare provider or pharmacist prior to using any new prescription or non-prescription medications, including herbals, vitamins, non-steroidal anti-inflammatory drugs (NSAIDs) and supplements.  This website has more information on Eliquis (apixaban): http://www.eliquis.com/eliquis/home _________________________________________________________________________________________

## 2015-08-06 NOTE — ED Provider Notes (Signed)
LACERATION REPAIR Performed by: Sharlene Mottsartner, Vyolet Sakuma W Authorized by: Sharlene Mottsartner, Selestino Nila W Consent: Verbal consent obtained. Risks and benefits: risks, benefits and alternatives were discussed Consent given by: patient Patient identity confirmed: provided demographic data Prepped and Draped in normal sterile fashion Wound explored  Laceration Location: forehead  Laceration Length: 3cm  No Foreign Bodies seen or palpated  Anesthesia: local infiltration  Local anesthetic: lidocaine 2% w epinephrine  Anesthetic total: 7 ml  Irrigation method: syringe Amount of cleaning: standard  Skin closure: 5-0 Prolene  Number of sutures: 6  Technique: SI  Patient tolerance: Patient tolerated the procedure well with no immediate complications.   Mark PeekBenjamin Tyannah Sane, PA-C 08/06/15 1117  Mancel BaleElliott Wentz, MD 08/06/15 [id number]

## 2015-08-06 NOTE — Procedures (Signed)
History: 74 yo M with syncope  Sedation: none  Technique: This is a 21 channel routine scalp EEG performed at the bedside with bipolar and monopolar montages arranged in accordance to the international 10/20 system of electrode placement. One channel was dedicated to EKG recording.    Background: The background consists of intermixed alpha and beta activities. There is a well defined posterior dominant rhythm of 8.5 Hz that attenuates with eye opening. Sleep is recorded with normal appearing structures.   Photic stimulation: Physiologic driving is note performed  EEG Abnormalities: none  Clinical Interpretation: This normal EEG is recorded in the waking and sleep state. There was no seizure or seizure predisposition recorded on this study. Please note that a normal EEG does not preclude the possibility of epilepsy.   Ritta SlotMcNeill Tramell Piechota, MD [facility] [phone]  If 7pm- 7am, please page neurology on call as listed in AMION.

## 2015-08-06 NOTE — ED Provider Notes (Signed)
CSN: [id number]     Arrival date & time 08/06/15  0645 History   First MD Initiated Contact with Patient 08/06/15 [phone]     Chief Complaint  Patient presents with  . Fall     (Consider location/radiation/quality/duration/timing/severity/associated sxs/prior Treatment) HPI Comments: Patient is a 74 year old male with a past medical history of atrial fibrillation, CVA, COPD, hyperlipidemia, and conditions listed below presenting to the ED after a fall this morning. Patient states he was walking to the bathroom this morning when he "just fainted."  He was accompanied by his wife who states she was in the bedroom and does not know when this happened and whether the patient had lost consciousness or for how long. Patient denies any history of previous falls. Denies any history of seizures. Reports having symptoms of common cold last week but denies having any fevers, chills, chest pain, abdominal pain, nausea, vomiting, or diarrhea. States he is chronically short of breath due to COPD and is on 3 L home oxygen. In addition, reports having occasional palpitations, he has a history of atrial fibrillation (on Eliquis). Wife states patient had a stroke 6 months ago and since then has been having some mild confusion, no motor or sensory loss, no problems with speech.   Past Medical History  Diagnosis Date  . Atrial fibrillation (HCC)   . Hyperlipidemia   . Weakness   . Dyspnea   . Headache(784.0)   . Osteopenia   . COPD (chronic obstructive pulmonary disease) (HCC)   . Allergic rhinitis   . History of echocardiogram     Echo 5/16:  EF 55-60%, no RWMA, Gr 1 DD  . Incontinence of urine     and stool  . Memory loss   . Confusion   . Numbness     bilateral forearms   Past Surgical History  Procedure Laterality Date  . Catheter ablation    . Cataract extraction, bilateral    . Appendectomy  1958  . Inguinal hernia repair  1975, 1992  . Video assisted thoracoscopy (vats)/thorocotomy  08/17/2012     resection / stapling of blebs, mechanical pleurodesis   . Video assisted thoracoscopy  08/17/2012    Procedure: VIDEO ASSISTED THORACOSCOPY;  Surgeon: [name], MD;  Location: [facility] OR;  Service: Thoracic;  Laterality: Right;  . Resection of apical bleb  08/17/2012    Procedure: RESECTION OF APICAL BLEB;  Surgeon: [name], MD;  Location: [facility] OR;  Service: Thoracic;  Laterality: Right;  stapling of bleb  . Video bronchoscopy  08/27/2012    Procedure: VIDEO BRONCHOSCOPY;  Surgeon: [name], MD;  Location: [facility] OR;  Service: Thoracic;  Laterality: N/A;  evaluation of endobronchial valves   Family History  Problem Relation Age of Onset  . Heart attack Father   . COPD Father   . Lung cancer Father   . COPD Brother   . COPD Sister   . Stroke Mother   . Hypertension Brother   . Throat cancer Brother    Social History  Substance Use Topics  . Smoking status: Former Smoker -- 3.00 packs/day for 15 years    Types: Cigarettes  . Smokeless tobacco: Former Neurosurgeon    Quit date: 09/01/1965  . Alcohol Use: No    Review of Systems  Constitutional: Negative for fever and chills.  HENT: Negative for sore throat.   Eyes: Negative for pain and discharge.  Respiratory: Positive for shortness of breath. Negative for cough and wheezing.  Cardiovascular: Positive for palpitations. Negative for chest pain and leg swelling.  Gastrointestinal: Negative for nausea, vomiting, abdominal pain, diarrhea and constipation.  Genitourinary: Negative for difficulty urinating.  Musculoskeletal: Negative for myalgias, back pain and neck pain.  Skin: Positive for wound.  Neurological: Positive for syncope. Negative for speech difficulty, weakness, numbness and headaches.      Allergies  Shrimp and Zolpidem tartrate  Home Medications   Prior to Admission medications   Medication Sig Start Date End Date Taking? Authorizing Provider  acetaminophen (TYLENOL) 325 MG tablet Take 650 mg by mouth  every 6 (six) hours as needed.    Historical Provider, MD  albuterol (PROAIR HFA) 108 (90 BASE) MCG/ACT inhaler Inhale 2 puffs into the lungs every 4 (four) hours as needed for wheezing or shortness of breath. For shortness of breath/wheezing 07/26/15   Waymon Budgelinton D Young, MD  albuterol (PROVENTIL) (2.5 MG/3ML) 0.083% nebulizer solution USE ONE VIAL VIA NEBULIZER 4 TIMES DAILY 07/13/14   Waymon Budgelinton D Young, MD  alendronate (FOSAMAX) 70 MG tablet Take 70 mg by mouth every 7 (seven) days. Takes on Tuesday Take with a full glass of water on an empty stomach.    Historical Provider, MD  alfuzosin (UROXATRAL) 10 MG 24 hr tablet Take 1 tablet by mouth daily. 07/11/15   Historical Provider, MD  amoxicillin-clavulanate (AUGMENTIN) 875-125 MG tablet Take 1 tablet by mouth 2 (two) times daily. 07/26/15   Waymon Budgelinton D Young, MD  apixaban (ELIQUIS) 5 MG TABS tablet Take 1 tablet (5 mg total) by mouth 2 (two) times daily. 01/12/15   Beatrice LecherScott T Weaver, PA-C  arformoterol (BROVANA) 15 MCG/2ML NEBU Take 2 mLs (15 mcg total) by nebulization 2 (two) times daily. 07/26/15   Waymon Budgelinton D Young, MD  atorvastatin (LIPITOR) 20 MG tablet Take 1 tablet (20 mg total) by mouth daily. 01/25/15   Marvel PlanJindong Xu, MD  Calcium-Magnesium-Vitamin D (ONE-A-DAY CALCIUM PLUS) 500-50-100 MG-MG-UNIT CHEW Chew 1 tablet by mouth 2 (two) times daily.      Historical Provider, MD  EPINEPHrine 0.3 mg/0.3 mL IJ SOAJ injection Inject 0.3 mLs (0.3 mg total) into the muscle once. For severe shortness of breath 07/24/14   Waymon Budgelinton D Young, MD  furosemide (LASIX) 20 MG tablet TAKE 1 TABLET (20 MG TOTAL) BY MOUTH EVERY OTHER DAY. 05/14/15   Duke SalviaSteven C Klein, MD  guaiFENesin (MUCINEX) 600 MG 12 hr tablet Take 1-2 mg by mouth every 12 (twelve) hours as needed. For cough    Historical Provider, MD  magnesium oxide (MAG-OX) 400 MG tablet Take 1 tablet (400 mg total) by mouth daily. 09/02/11   Duke SalviaSteven C Klein, MD  Multiple Vitamins-Minerals (CENTRUM SILVER PO) Take 1 tablet by mouth  daily.      Historical Provider, MD  Nebulizers (COMPRESSOR/NEBULIZER) MISC Use as directed 06/16/13   Waymon Budgelinton D Young, MD  omeprazole (PRILOSEC) 20 MG capsule Take 1 capsule (20 mg total) by mouth 2 (two) times daily. 11/04/12   Glori LuisEric G Sonnenberg, MD  predniSONE (DELTASONE) 20 MG tablet TAKE 1 TABLET (20 MG TOTAL) BY MOUTH DAILY. 06/26/15   Waymon Budgelinton D Young, MD  promethazine-codeine (PHENERGAN WITH CODEINE) 6.25-10 MG/5ML syrup TAKE 5 ML EVERY 6 HOURS AS NEEDED FOR COUGH 07/30/15   Waymon Budgelinton D Young, MD  propafenone (RYTHMOL) 225 MG tablet TAKE ONE TABLET BY MOUTH TWICE A DAY 01/24/15   Scott T Alben SpittleWeaver, PA-C  Umeclidinium-Vilanterol (ANORO ELLIPTA) 62.5-25 MCG/INH AEPB Inhale 1 puff into the lungs daily. RINSE MOUTH WELL AFTER USE 11/22/14  Waymon Budge, MD  verapamil (CALAN-SR) 240 MG CR tablet Take 0.5 tablets (120 mg total) by mouth daily. 05/14/15   Duke Salvia, MD   BP 112/80 mmHg  Pulse 100  Temp(Src) 97.3 F (36.3 C) (Oral)  Resp 19  SpO2 97% Physical Exam  Constitutional: He is oriented to person, place, and time. He appears well-developed and well-nourished.  HENT:  Head: Normocephalic.  Tongue is bleeding.   Eyes: EOM are normal. Pupils are equal, round, and reactive to light.  Neck: Neck supple. No tracheal deviation present.  Cardiovascular: Normal rate and intact distal pulses.  Exam reveals no gallop and no friction rub.   No murmur heard. Irregular rhythm  Pulmonary/Chest: Effort normal. No respiratory distress. He has no wheezes. He has no rales.  Abdominal: Soft. Bowel sounds are normal. He exhibits no distension. There is no tenderness.  Musculoskeletal: He exhibits no edema.  Neurological: He is alert and oriented to person, place, and time. No cranial nerve deficit.  Strength and sensation grossly intact in bilateral upper and lower extremities.  Skin: Skin is warm and dry.  3.5 cm laceration on forehead bleeding.  Right arm abrasion noted.    ED Course   Procedures (including critical care time) Labs Review Labs Reviewed  CBC WITH DIFFERENTIAL/PLATELET  COMPREHENSIVE METABOLIC PANEL  URINALYSIS, ROUTINE W REFLEX MICROSCOPIC (NOT AT [facility])  CBG MONITORING, ED    Imaging Review Ct Head Wo Contrast  08/06/2015  CLINICAL DATA:  Syncope this morning.  Hit forehead. EXAM: CT HEAD WITHOUT CONTRAST CT CERVICAL SPINE WITHOUT CONTRAST TECHNIQUE: Multidetector CT imaging of the head and cervical spine was performed following the standard protocol without intravenous contrast. Multiplanar CT image reconstructions of the cervical spine were also generated. COMPARISON:  MRI 01/11/2015 FINDINGS: CT HEAD FINDINGS Old right frontal infarct with encephalomalacia. Mild chronic small vessel disease throughout the deep white matter. Age related volume loss. No acute infarction, hemorrhage or hydrocephalus. No mass lesion or midline shift. No acute calvarial abnormality. Visualized paranasal sinuses and mastoids clear. Orbital soft tissues unremarkable. CT CERVICAL SPINE FINDINGS Severe diffuse degenerative disc disease throughout the cervical spine. Mild to moderate diffuse degenerative facet disease bilaterally. Loss of normal cervical lordosis. Slight anterolisthesis of C2 on C3 of 2 mm, likely related to facet disease. No visible fracture. No epidural or paraspinal hematoma. Carotid artery calcifications bilaterally. 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  No acute intracranial abnormality. Loss of normal cervical lordosis. Degenerative changes in the cervical spine as above. No acute bony abnormality. Electronically Signed   By: Charlett Nose M.D.   On: 08/06/2015 07:30   Ct Cervical Spine Wo Contrast  08/06/2015  CLINICAL DATA:  Syncope this morning.  Hit forehead. EXAM: CT HEAD WITHOUT CONTRAST CT CERVICAL SPINE WITHOUT CONTRAST TECHNIQUE: Multidetector CT imaging of the head and cervical spine was performed following the standard protocol without intravenous contrast.  Multiplanar CT image reconstructions of the cervical spine were also generated. COMPARISON:  MRI 01/11/2015 FINDINGS: CT HEAD FINDINGS Old right frontal infarct with encephalomalacia. Mild chronic small vessel disease throughout the deep white matter. Age related volume loss. No acute infarction, hemorrhage or hydrocephalus. No mass lesion or midline shift. No acute calvarial abnormality. Visualized paranasal sinuses and mastoids clear. Orbital soft tissues unremarkable. CT CERVICAL SPINE FINDINGS Severe diffuse degenerative disc disease throughout the cervical spine. Mild to moderate diffuse degenerative facet disease bilaterally. Loss of normal cervical lordosis. Slight anterolisthesis of C2 on C3 of 2 mm, likely related to  facet disease. No visible fracture. No epidural or paraspinal hematoma. Carotid artery calcifications bilaterally. 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  No acute intracranial abnormality. Loss of normal cervical lordosis. Degenerative changes in the cervical spine as above. No acute bony abnormality. Electronically Signed   By: Charlett Nose M.D.   On: 08/06/2015 07:30   I have personally reviewed and evaluated these images and lab results as part of my medical decision-making.   EKG Interpretation None      MDM   Final diagnoses:  None  Fall at home Patient is presenting after a fall at home which seems to be non-mechanical in nature. He denies a history of seizures, however, it was noted on physical examination that his tongue was bleeding. CT of head showing an old right MCA infarct, no acute intracranial abnormality. CT of C-spine showing no acute bony abnormality. EKG showing normal sinus rhythm and premature atrial complexes. Chest x-ray showing emphysema and chronic scarring; no acute abnormality. Patient is afebrile (T 98.3) but has an elevated white count 17.3 which could possibly be related to seizure activity. In addition, patient has a history of A. fib which is  concerning. Family medicine has been consulted and will be admitting the patient for further workup. Forehead laceration has been repaired by Joycie Peek, PA-C.   John Giovanni, MD 08/06/15 1007  John Giovanni, MD 08/06/15 1152  Mancel Bale, MD 08/06/15 1736

## 2015-08-06 NOTE — Progress Notes (Signed)
EEG completed; results pending.    

## 2015-08-06 NOTE — Consult Note (Signed)
NEURO HOSPITALIST CONSULT NOTE   Requestig physician: Dr. Pollie Meyer   Reason for Consult: syncope/seizure  HPI:                                                                                                                                          Mark [name] is an 74 y.o. male with known CVA 6 months ago and followed by Dr. Roda Shutters as out patient. Today, patient got out of bed and walked into the bathroom.  He recalls nothing other than awakening in a puddle of blood and on the floor. He denies any tunnel vision, light headedness, blurred vision. His wife found him confused on the floor but he did not urinate on himself. His confusion lasted for about 10 minutes. Currently he is back to his baseline.  HE denies any seizure history, head trauma, benzodiazepine or ETOH abuse.   Past Medical History  Diagnosis Date  . Atrial fibrillation (HCC)   . Hyperlipidemia   . Weakness   . Dyspnea   . Headache(784.0)   . Osteopenia   . COPD (chronic obstructive pulmonary disease) (HCC)   . Allergic rhinitis   . History of echocardiogram     Echo 5/16:  EF 55-60%, no RWMA, Gr 1 DD  . Incontinence of urine     and stool  . Memory loss   . Confusion   . Numbness     bilateral forearms    Past Surgical History  Procedure Laterality Date  . Catheter ablation    . Cataract extraction, bilateral    . Appendectomy  1958  . Inguinal hernia repair  1975, 1992  . Video assisted thoracoscopy (vats)/thorocotomy  08/17/2012    resection / stapling of blebs, mechanical pleurodesis   . Video assisted thoracoscopy  08/17/2012    Procedure: VIDEO ASSISTED THORACOSCOPY;  Surgeon: [name], MD;  Location: [facility] OR;  Service: Thoracic;  Laterality: Right;  . Resection of apical bleb  08/17/2012    Procedure: RESECTION OF APICAL BLEB;  Surgeon: [name], MD;  Location: [facility] OR;  Service: Thoracic;  Laterality: Right;  stapling of bleb  . Video bronchoscopy  08/27/2012    Procedure:  VIDEO BRONCHOSCOPY;  Surgeon: [name], MD;  Location: [facility] OR;  Service: Thoracic;  Laterality: N/A;  evaluation of endobronchial valves    Family History  Problem Relation Age of Onset  . Heart attack Father   . COPD Father   . Lung cancer Father   . COPD Brother   . COPD Sister   . Stroke Mother   . Hypertension Brother   . Throat cancer Brother      Social History:  reports that he has quit smoking. His smoking use included Cigarettes. He has a  45 pack-year smoking history. He quit smokeless tobacco use about 49 years ago. He reports that he does not drink alcohol or use illicit drugs.  Allergies  Allergen Reactions  . Shrimp [Shellfish Allergy] Diarrhea and Nausea And Vomiting  . Zolpidem Tartrate     disoriented    MEDICATIONS:                                                                                                                     Current Facility-Administered Medications  Medication Dose Route Frequency Provider Last Rate Last Dose  . levETIRAcetam (KEPPRA) 500 mg in sodium chloride 0.9 % 100 mL IVPB  500 mg Intravenous Q12H Ulice Dash, PA-C      . sodium chloride 0.9 % bolus 1,000 mL  1,000 mL Intravenous Once Abram Sander, MD       Current Outpatient Prescriptions  Medication Sig Dispense Refill  . acetaminophen (TYLENOL) 325 MG tablet Take 650 mg by mouth every 6 (six) hours as needed for mild pain.     Marland Kitchen albuterol (PROAIR HFA) 108 (90 BASE) MCG/ACT inhaler Inhale 2 puffs into the lungs every 4 (four) hours as needed for wheezing or shortness of breath. For shortness of breath/wheezing 1 Inhaler prn  . albuterol (PROVENTIL) (2.5 MG/3ML) 0.083% nebulizer solution USE ONE VIAL VIA NEBULIZER 4 TIMES DAILY (Patient taking differently: USE ONE VIAL VIA NEBULIZER 4 TIMES DAILY as needed for wheezing or shortness of breath) 120 mL 4  . alendronate (FOSAMAX) 70 MG tablet Take 70 mg by mouth every 7 (seven) days. Takes on Tuesday Take with a full glass of  water on an empty stomach.    Marland Kitchen alfuzosin (UROXATRAL) 10 MG 24 hr tablet Take 1 tablet by mouth daily.  11  . apixaban (ELIQUIS) 5 MG TABS tablet Take 1 tablet (5 mg total) by mouth 2 (two) times daily. 60 tablet 11  . arformoterol (BROVANA) 15 MCG/2ML NEBU Take 2 mLs (15 mcg total) by nebulization 2 (two) times daily. 32 mL 0  . atorvastatin (LIPITOR) 20 MG tablet Take 1 tablet (20 mg total) by mouth daily. 90 tablet 3  . Calcium-Magnesium-Vitamin D (ONE-A-DAY CALCIUM PLUS) 500-50-100 MG-MG-UNIT CHEW Chew 1 tablet by mouth 2 (two) times daily.      . furosemide (LASIX) 20 MG tablet TAKE 1 TABLET (20 MG TOTAL) BY MOUTH EVERY OTHER DAY. 15 tablet 11  . guaiFENesin (MUCINEX) 600 MG 12 hr tablet Take 1-2 mg by mouth every 12 (twelve) hours as needed. For cough    . magnesium oxide (MAG-OX) 400 MG tablet Take 1 tablet (400 mg total) by mouth daily.    . Multiple Vitamins-Minerals (CENTRUM SILVER PO) Take 1 tablet by mouth daily.      Marland Kitchen omeprazole (PRILOSEC) 20 MG capsule Take 1 capsule (20 mg total) by mouth 2 (two) times daily. 60 capsule 1  . predniSONE (DELTASONE) 20 MG tablet TAKE 1 TABLET (20 MG TOTAL) BY MOUTH DAILY. 30 tablet 5  .  promethazine-codeine (PHENERGAN WITH CODEINE) 6.25-10 MG/5ML syrup TAKE 5 ML EVERY 6 HOURS AS NEEDED FOR COUGH 200 mL 0  . propafenone (RYTHMOL) 225 MG tablet TAKE ONE TABLET BY MOUTH TWICE A DAY 180 tablet 3  . Umeclidinium-Vilanterol (ANORO ELLIPTA) 62.5-25 MCG/INH AEPB Inhale 1 puff into the lungs daily. RINSE MOUTH WELL AFTER USE 3 each 3  . verapamil (CALAN-SR) 240 MG CR tablet Take 0.5 tablets (120 mg total) by mouth daily. 15 tablet 11  . amoxicillin-clavulanate (AUGMENTIN) 875-125 MG tablet Take 1 tablet by mouth 2 (two) times daily. (Patient not taking: Reported on 08/06/2015) 14 tablet 1  . EPINEPHrine 0.3 mg/0.3 mL IJ SOAJ injection Inject 0.3 mLs (0.3 mg total) into the muscle once. For severe shortness of breath 1 Device prn  . Nebulizers  (COMPRESSOR/NEBULIZER) MISC Use as directed (Patient not taking: Reported on 08/06/2015) 1 each 0      ROS:                                                                                                                                       History obtained from the patient  General ROS: negative for - chills, fatigue, fever, night sweats, weight gain or weight loss Psychological ROS: negative for - behavioral disorder, hallucinations, memory difficulties, mood swings or suicidal ideation Ophthalmic ROS: negative for - blurry vision, double vision, eye pain or loss of vision ENT ROS: negative for - epistaxis, nasal discharge, oral lesions, sore throat, tinnitus or vertigo Allergy and Immunology ROS: negative for - hives or itchy/watery eyes Hematological and Lymphatic ROS: negative for - bleeding problems, bruising or swollen lymph nodes Endocrine ROS: negative for - galactorrhea, hair pattern changes, polydipsia/polyuria or temperature intolerance Respiratory ROS: negative for - cough, hemoptysis, shortness of breath or wheezing Cardiovascular ROS: negative for - chest pain, dyspnea on exertion, edema or irregular heartbeat Gastrointestinal ROS: negative for - abdominal pain, diarrhea, hematemesis, nausea/vomiting or stool incontinence Genito-Urinary ROS: negative for - dysuria, hematuria, incontinence or urinary frequency/urgency Musculoskeletal ROS: negative for - joint swelling or muscular weakness Neurological ROS: as noted in HPI Dermatological ROS: negative for rash and skin lesion changes   Blood pressure 115/72, pulse 101, temperature 98.3 F (36.8 C), temperature source Rectal, resp. rate 20, SpO2 93 %.   Neurologic Examination:                                                                                                      HEENT-  Normocephalic, no  lesions, without obvious abnormality.  Normal external eye and conjunctiva.  Normal TM's bilaterally.  Normal auditory canals  and external ears. Normal external nose, mucus membranes and septum.  Normal pharynx. Cardiovascular- irregularly irregular rhythm, pulses palpable throughout   Lungs- chest clear, no wheezing, rales, normal symmetric air entry, Heart exam - S1, S2 normal, no murmur, no gallop, rate regular Abdomen- normal findings: bowel sounds normal Extremities- no edema Lymph-no adenopathy palpable Musculoskeletal-no joint tenderness, deformity or swelling Skin-multiple bruised on arms and lags from fall.   Neurological Examination Mental Status: Alert, oriented, thought content appropriate.  Speech dysarthric from tongue laceration without evidence of aphasia.  Able to follow 3 step commands without difficulty. Cranial Nerves: II: Discs flat bilaterally; Visual Bollier grossly normal, pupils equal, round, reactive to light and accommodation III,IV, VI: ptosis not present, extra-ocular motions intact bilaterally V,VII: smile symmetric, facial light touch sensation normal bilaterally VIII: hearing normal bilaterally IX,X: uvula rises symmetrically XI: bilateral shoulder shrug XII: midline tongue extension Motor: Right : Upper extremity   5/5    Left:     Upper extremity   5/5  Lower extremity   5/5     Lower extremity   5/5 Tone and bulk:normal tone throughout; no atrophy noted Sensory: Pinprick and light touch intact throughout, bilaterally Deep Tendon Reflexes: 1+ and symmetric throughout Plantars: Right: downgoing   Left: downgoing Cerebellar: normal finger-to-nose,and normal heel-to-shin test Gait: not tested      Lab Results: Basic Metabolic Panel:  Recent Labs Lab 08/06/15 0843  NA 139  K 3.8  CL 102  CO2 29  GLUCOSE 95  BUN 17  CREATININE 0.81  CALCIUM 8.7*    Liver Function Tests:  Recent Labs Lab 08/06/15 0843  AST 19  ALT 22  ALKPHOS 36*  BILITOT 0.6  PROT 6.3*  ALBUMIN 3.3*   No results for input(s): LIPASE, AMYLASE in the last 168 hours. No results for  input(s): AMMONIA in the last 168 hours.  CBC:  Recent Labs Lab 08/06/15 0805  WBC 17.3*  NEUTROABS 13.5*  HGB 13.0  HCT 41.3  MCV 101.7*  PLT 195    Cardiac Enzymes:  Recent Labs Lab 08/06/15 0843  CKTOTAL 258    Lipid Panel: No results for input(s): CHOL, TRIG, HDL, CHOLHDL, VLDL, LDLCALC in the last 168 hours.  CBG:  Recent Labs Lab 08/06/15 0806  GLUCAP 109*    Microbiology: Results for orders placed or performed during the hospital encounter of 10/30/12  Culture, blood (routine x 2)     Status: None   Collection Time: 10/30/12  1:55 PM  Result Value Ref Range Status   Specimen Description BLOOD LEFT ARM  Final   Special Requests BOTTLES DRAWN AEROBIC AND ANAEROBIC 10CC  Final   Culture  Setup Time 10/31/2012 00:10  Final   Culture NO GROWTH 5 DAYS  Final   Report Status 11/08/2012 FINAL  Final  Culture, blood (routine x 2)     Status: None   Collection Time: 10/30/12  2:00 PM  Result Value Ref Range Status   Specimen Description BLOOD RIGHT ARM  Final   Special Requests BOTTLES DRAWN AEROBIC AND ANAEROBIC 10CC  Final   Culture  Setup Time 10/31/2012 00:10  Final   Culture NO GROWTH 5 DAYS  Final   Report Status 11/08/2012 FINAL  Final  Urine culture     Status: None   Collection Time: 10/30/12  2:29 PM  Result Value Ref Range Status  Specimen Description URINE, CLEAN CATCH  Final   Special Requests NONE  Final   Culture  Setup Time 10/31/2012 03:35  Final   Colony Count >=100,000 COLONIES/ML  Final   Culture   Final    Multiple bacterial morphotypes present, none predominant. Suggest appropriate recollection if clinically indicated.   Report Status 11/01/2012 FINAL  Final  Body fluid culture     Status: None   Collection Time: 10/30/12  5:08 PM  Result Value Ref Range Status   Specimen Description PLEURAL FLUID  Final   Special Requests NONE  Final   Gram Stain   Final    FEW WBC PRESENT, PREDOMINANTLY PMN NO ORGANISMS SEEN   Culture NO  GROWTH 3 DAYS  Final   Report Status 11/03/2012 FINAL  Final    Coagulation Studies: No results for input(s): LABPROT, INR in the last 72 hours.  Imaging: Dg Chest 2 View  08/06/2015  CLINICAL DATA:  Increasing shortness of breath today. Fall with forehead laceration. EXAM: CHEST  2 VIEW COMPARISON:  Radiographs 05/03/2015 and 11/22/2014. FINDINGS: The heart size and mediastinal contours are stable. There is stable distortion of the right hilum secondary to previous partial right lung resection and associated scarring. There is stable pleural thickening at the right costophrenic angle. Diffuse emphysematous changes are present bilaterally. There is no evidence of superimposed airspace disease, edema, pleural effusion or pneumothorax. Old rib fractures/thoracotomy defects on the right are unchanged. IMPRESSION: Stable chest with emphysema and chronic scarring. No acute findings demonstrated. Electronically Signed   By: Carey Bullocks M.D.   On: 08/06/2015 09:36   Ct Head Wo Contrast  08/06/2015  CLINICAL DATA:  Syncope this morning.  Hit forehead. EXAM: CT HEAD WITHOUT CONTRAST CT CERVICAL SPINE WITHOUT CONTRAST TECHNIQUE: Multidetector CT imaging of the head and cervical spine was performed following the standard protocol without intravenous contrast. Multiplanar CT image reconstructions of the cervical spine were also generated. COMPARISON:  MRI 01/11/2015 FINDINGS: CT HEAD FINDINGS Old right frontal infarct with encephalomalacia. Mild chronic small vessel disease throughout the deep white matter. Age related volume loss. No acute infarction, hemorrhage or hydrocephalus. No mass lesion or midline shift. No acute calvarial abnormality. Visualized paranasal sinuses and mastoids clear. Orbital soft tissues unremarkable. CT CERVICAL SPINE FINDINGS Severe diffuse degenerative disc disease throughout the cervical spine. Mild to moderate diffuse degenerative facet disease bilaterally. Loss of normal  cervical lordosis. Slight anterolisthesis of C2 on C3 of 2 mm, likely related to facet disease. No visible fracture. No epidural or paraspinal hematoma. Carotid artery calcifications bilaterally. 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  No acute intracranial abnormality. Loss of normal cervical lordosis. Degenerative changes in the cervical spine as above. No acute bony abnormality. Electronically Signed   By: Charlett Nose M.D.   On: 08/06/2015 07:30   Ct Cervical Spine Wo Contrast  08/06/2015  CLINICAL DATA:  Syncope this morning.  Hit forehead. EXAM: CT HEAD WITHOUT CONTRAST CT CERVICAL SPINE WITHOUT CONTRAST TECHNIQUE: Multidetector CT imaging of the head and cervical spine was performed following the standard protocol without intravenous contrast. Multiplanar CT image reconstructions of the cervical spine were also generated. COMPARISON:  MRI 01/11/2015 FINDINGS: CT HEAD FINDINGS Old right frontal infarct with encephalomalacia. Mild chronic small vessel disease throughout the deep white matter. Age related volume loss. No acute infarction, hemorrhage or hydrocephalus. No mass lesion or midline shift. No acute calvarial abnormality. Visualized paranasal sinuses and mastoids clear. Orbital soft tissues unremarkable. CT CERVICAL SPINE FINDINGS Severe diffuse  degenerative disc disease throughout the cervical spine. Mild to moderate diffuse degenerative facet disease bilaterally. Loss of normal cervical lordosis. Slight anterolisthesis of C2 on C3 of 2 mm, likely related to facet disease. No visible fracture. No epidural or paraspinal hematoma. Carotid artery calcifications bilaterally. 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  No acute intracranial abnormality. Loss of normal cervical lordosis. Degenerative changes in the cervical spine as above. No acute bony abnormality. Electronically Signed   By: Charlett Nose M.D.   On: 08/06/2015 07:30       Assessment and plan per attending neurologist  Felicie Morn  PA-C [facility] [phone]  08/06/2015, 11:22 AM   Assessment/Plan: 74 YO male with possible seizure versus syncope. History is limited but he does have history of CVA which could have been a provoking factor. Will obtain EEG and MRI.  At this time would lean toward starting Keppra 500 mg BID and have follow up with Dr. Roda Shutters as out patient in 2-3 weeks.   No driving, operating heavy machinery, perform activities at heights, swimming or participation in water activities until release by outpatient physician.  This has been discussed with patient.   Patient seen and examined together with physician assistant and I concur with the assessment and plan.  Wyatt Portela, MD

## 2015-08-06 NOTE — ED Notes (Signed)
Pt CBG, 109. Nurse was notified. 

## 2015-08-07 ENCOUNTER — Inpatient Hospital Stay (HOSPITAL_COMMUNITY): Payer: Medicare Other

## 2015-08-07 DIAGNOSIS — I10 Essential (primary) hypertension: Secondary | ICD-10-CM

## 2015-08-07 DIAGNOSIS — J449 Chronic obstructive pulmonary disease, unspecified: Secondary | ICD-10-CM

## 2015-08-07 DIAGNOSIS — I69398 Other sequelae of cerebral infarction: Secondary | ICD-10-CM

## 2015-08-07 DIAGNOSIS — S0101XA Laceration without foreign body of scalp, initial encounter: Secondary | ICD-10-CM

## 2015-08-07 DIAGNOSIS — G4089 Other seizures: Secondary | ICD-10-CM

## 2015-08-07 LAB — CBC
HEMATOCRIT: 38.8 % — AB (ref 39.0–52.0)
Hemoglobin: 11.7 g/dL — ABNORMAL LOW (ref 13.0–17.0)
MCH: 31.1 pg (ref 26.0–34.0)
MCHC: 30.2 g/dL (ref 30.0–36.0)
MCV: 103.2 fL — AB (ref 78.0–100.0)
PLATELETS: 213 10*3/uL (ref 150–400)
RBC: 3.76 MIL/uL — AB (ref 4.22–5.81)
RDW: 14.8 % (ref 11.5–15.5)
WBC: 12.4 10*3/uL — ABNORMAL HIGH (ref 4.0–10.5)

## 2015-08-07 LAB — BASIC METABOLIC PANEL
Anion gap: 9 (ref 5–15)
BUN: 14 mg/dL (ref 6–20)
CHLORIDE: 101 mmol/L (ref 101–111)
CO2: 29 mmol/L (ref 22–32)
CREATININE: 0.93 mg/dL (ref 0.61–1.24)
Calcium: 8.9 mg/dL (ref 8.9–10.3)
GFR calc non Af Amer: >60 mL/min (ref >60)
Glucose, Bld: 172 mg/dL — ABNORMAL HIGH (ref 65–99)
POTASSIUM: 3.8 mmol/L (ref 3.5–5.1)
Sodium: 139 mmol/L (ref 135–145)

## 2015-08-07 LAB — GLUCOSE, CAPILLARY: GLUCOSE-CAPILLARY: 179 mg/dL — AB (ref 65–99)

## 2015-08-07 LAB — LIPID PANEL
CHOLESTEROL: 153 mg/dL (ref 0–200)
HDL: 55 mg/dL (ref >40)
LDL Cholesterol: 68 mg/dL (ref 0–99)
Total CHOL/HDL Ratio: 2.8 RATIO
Triglycerides: 150 mg/dL — ABNORMAL HIGH (ref <150)
VLDL: 30 mg/dL (ref 0–40)

## 2015-08-07 MED ORDER — PREDNISONE 20 MG PO TABS
20.0000 mg | ORAL_TABLET | Freq: Every day | ORAL | Status: DC
  Administered 2015-08-07 – 2015-08-08 (×2): 20 mg via ORAL
  Filled 2015-08-07 (×2): qty 1

## 2015-08-07 NOTE — Progress Notes (Signed)
STROKE TEAM PROGRESS NOTE   HISTORY Mark [name] is an 74 y.o. male with known CVA 6 months ago and followed by Dr. Roda Shutters as out patient. Today 08/06/2015, patient got out of bed and walked into the bathroom. He recalls nothing other than awakening in a puddle of blood and on the floor. He denies any tunnel vision, light headedness, blurred vision. His wife found him confused on the floor but he did not urinate on himself. His confusion lasted for about 10 minutes. Currently he is back to his baseline. HE denies any seizure history, head trauma, benzodiazepine or ETOH abuse. Patient was not administered TPA secondary to stroke not suspected on admission.    SUBJECTIVE (INTERVAL HISTORY) His wife is at the bedside.  Overall he feels his condition is stable. I recounted history from patient and family in detail. He denied any prodromal symptoms to suggest syncope or any focal new weakness to suggest a stroke. He has no prior history of seizures Feel his symptoms from yesterday are resolved. No hx seizure.    OBJECTIVE Temp:  [97.4 F (36.3 C)-98.4 F (36.9 C)] 98.4 F (36.9 C) (12/13 0343) Pulse Rate:  [92-133] 92 (12/13 0343) Cardiac Rhythm:  [-] Normal sinus rhythm (12/12 1900) Resp:  [10-20] 17 (12/13 0343) BP: (100-135)/(54-94) 111/68 mmHg (12/13 0343) SpO2:  [93 %-99 %] 98 % (12/13 0343) Weight:  [73.846 kg (162 lb 12.8 oz)-73.982 kg (163 lb 1.6 oz)] 73.982 kg (163 lb 1.6 oz) (12/13 0343)  CBC:  Recent Labs Lab 08/06/15 0805 08/07/15 0410  WBC 17.3* 12.4*  NEUTROABS 13.5*  --   HGB 13.0 11.7*  HCT 41.3 38.8*  MCV 101.7* 103.2*  PLT 195 213    Basic Metabolic Panel:  Recent Labs Lab 08/06/15 0843 08/07/15 0410  NA 139 139  K 3.8 3.8  CL 102 101  CO2 29 29  GLUCOSE 95 172*  BUN 17 14  CREATININE 0.81 0.93  CALCIUM 8.7* 8.9  MG 2.6*  --   PHOS 3.3  --     Lipid Panel:    Component Value Date/Time   CHOL 245* 01/23/2015 1028   TRIG 233* 01/23/2015 1028   HDL 53  01/23/2015 1028   CHOLHDL 4.6 01/23/2015 1028   LDLCALC 145* 01/23/2015 1028   HgbA1c:  Lab Results  Component Value Date   HGBA1C 6.5* 01/23/2015   Urine Drug Screen: No results found for: LABOPIA, COCAINSCRNUR, LABBENZ, AMPHETMU, THCU, LABBARB    IMAGING  Dg Chest 2 View 08/06/2015  Stable chest with emphysema and chronic scarring. No acute findings demonstrated.   Ct Head Wo Contrast 08/06/2015  Old right MCA infarct.  No acute intracranial abnormality. Loss of normal cervical lordosis. Degenerative changes in the cervical spine as above. No acute bony abnormality.   Ct Cervical Spine Wo Contrast 08/06/2015  Old right MCA infarct.  No acute intracranial abnormality. Loss of normal cervical lordosis. Degenerative changes in the cervical spine as above. No acute bony abnormality.   Mr Brain Wo Contrast 08/06/2015   1. Motion degraded, incomplete study. 2. Chronic right frontal lobe MCA infarct with 1 cm focus of superimposed acute ischemia along its superior margin. 3. Moderate cerebral atrophy and chronic small vessel ischemic disease.   EEG This normal EEG is recorded in the waking and sleep state. There was no seizure or seizure predisposition recorded on this study.    PHYSICAL EXAM Pleasant elderly Caucasian male not in distress. He has multiple bruises on his  cheeks as well as forehead from his fall. . Afebrile. Head is nontraumatic. Neck is supple without bruit.    Cardiac exam no murmur or gallop. Lungs are clear to auscultation. Distal pulses are well felt. Neurological Exam :    Awake  Alert oriented x 3. Normal speech and language.eye movements full without nystagmus.fundi were not visualized. Vision acuity and Wuellner appear normal. Hearing is normal. Palatal movements are normal. Face symmetric. Tongue midline. Normal strength, tone, reflexes and coordination. Normal sensation. Gait deferred. ASSESSMENT/PLAN Mr. Mark [name] is a 74 y.o. male with history of stroke  6 months ago presenting with traumatic fall with possible syncope/seizure. MRI positive stroke. He did not receive IV t-PA due to stroke not suspected on admission.   Seizure, Secondary to previous stroke  Presented with traumatic fall  Resultant  Confusion resolved  EEG normal  Started on Keppra  EP following for watchful waiting versus implantable loop recorder due to syncope  Chronic right frontal lobe infarct with acute ischemia along the superior margin, T2 shine through vs seizure related sequela. No convincing evidence of new stroke  MRI  Chronic right frontal lobe MCA infarct with acute ischemia along superior margin. small vessel disease   MRA  No need to repeat (not ordered)  Carotid Doppler  No need to repeat (not ordered)  2D Echo  pending   LDL pending   HgbA1c pending  Eliquis for VTE prophylaxis  Diet Heart Room service appropriate?: Yes; Fluid consistency:: Thin  Eliquis (apixaban) daily prior to admission, now on Eliquis (apixaban) daily. Continue at discharge  Patient counseled to be compliant with his antithrombotic medications  Ongoing aggressive stroke risk factor management  Therapy recommendations:  pending   Disposition:  pending NOTHING FURTHER TO ADD FROM THE STROKE STANDPOINT Ongoing risk factor control by Primary Care Physician Stroke Service will sign off. Please call should any needs arise. [facility] with Dr. Marvel PlanJindong Xu as previously scheduled  Atrial Fibrillation  Home anticoagulation:  Eliquis, continued in hospital  Continue eliquis at discharge   R ICA Occlusion Diffuse intracranial stenosis  Known ICA occlusion  Hypertension  Stable  Hyperlipidemia  Home meds:  Lipitor 20, resumed in hospital  LDL pending, goal < 70  Continue statin at discharge  Other Stroke Risk Factors  Advanced age  Former Cigarette smoker  Occasional ETOH use  Hx stroke/TIA  11/2014  right MCA infarct, embolic due to A. Fib.   Family hx stroke (mother)  Other Active Problems  Baseline memory loss   COPD, severe with oxygen dependency  Diastolic heart failure  Hospital day # 1  BIBY,SHARON  [facility] See Amion for Pager information 08/07/2015 10:34 AM  I have personally examined this patient, reviewed notes, independently viewed imaging studies, participated in medical decision making and plan of care. I have made any additions or clarifications directly to the above note. Agree with note above. He presented with a fall and unexplained loss of consciousness followed by confusion and incontinence possibly unwitnessed seizure. MRI shows a questionable area of diffusion positivity which is unlikely to explain this episode and may represent T2 shine through versus postictal diffusion abnormality. He remains at risk for recurrent stroke, TIA, seizures, neurological worsening and needs to be on Keppra for seizure prophylaxis. I had a long discussion the patient and wife at the bedside and answered questions.  Delia HeadyPramod Kais Monje, MD Medical Director [facility] Pager: [phone] 08/07/2015 3:16 PM  To contact Stroke Continuity provider, please refer to [email or web address] After hours, contact General Neurology

## 2015-08-07 NOTE — Progress Notes (Signed)
Occupational Therapy Evaluation Patient Details Name: Mark [name] MRN: [id number] DOB: 04-13-1941 Today's Date: 08/07/2015    History of Present Illness Pt is an 74 y.o. male with known CVA 6 months ago and followed by Dr. Roda Shutters as out patient. Pt presents s/p syncope with fall while walking to bathroom. Pt experienced confusion lasting for about 10 minutes.    Clinical Impression   Pt admitted with the above diagnoses and presents with below problem list. Pt will benefit from continued acute OT to address the below listed deficits and maximize independence with BADLs prior to d/c to venue below. PTA pt was mod I with ADLs. Pt is currently min guard to min A for LB ADLs and functional mobility/transfers. Spouse present for session and providing 24 hour supervision at home. OT to continue to follow acutely.      Follow Up Recommendations  Home health OT;Supervision/Assistance - 24 hour    Equipment Recommendations  None recommended by OT    Recommendations for Other Services PT consult     Precautions / Restrictions Precautions Precautions: Fall Restrictions Weight Bearing Restrictions: No      Mobility Bed Mobility Overal bed mobility: Modified Independent             General bed mobility comments: pt used rails  Transfers Overall transfer level: Needs assistance Equipment used: Rolling walker (2 wheeled);1 person hand held assist;None Transfers: Sit to/from Stand Sit to Stand: Min guard         General transfer comment: from EOB and 3n1. External support utilized via rw, rails or grab bars. Close min guard.     Balance Overall balance assessment: Needs assistance;History of Falls Sitting-balance support: Feet supported;No upper extremity supported Sitting balance-Leahy Scale: Good Sitting balance - Comments: donned socks sitting EOB. sitting EOB upon therapist arrival.   Standing balance support: Single extremity supported;Bilateral upper extremity  supported;No upper extremity supported Standing balance-Leahy Scale: Poor Standing balance comment: support needed to maintain static standing balance. Swaying and narrow BOS noted. Slight posterior lean at times.                             ADL Overall ADL's : Needs assistance/impaired Eating/Feeding: Set up;Sitting   Grooming: Set up;Sitting Grooming Details (indicate cue type and reason): sits to groom at baseline Upper Body Bathing: Set up;Sitting   Lower Body Bathing: Min guard;Minimal assistance;Sit to/from stand   Upper Body Dressing : Sitting;Set up   Lower Body Dressing: Min guard;Minimal assistance;Sit to/from stand   Toilet Transfer: Min guard;Minimal assistance;Ambulation (3n1 over toilet)   Toileting- Clothing Manipulation and Hygiene: Min guard;Sit to/from stand Toileting - Clothing Manipulation Details (indicate cue type and reason): grab bars utilized; pt has sink next to toilet at home Tub/ Shower Transfer: Walk-in shower;Min guard;Minimal assistance;Ambulation;Shower seat   Functional mobility during ADLs: Min guard;Minimal assistance;Rolling walker General ADL Comments: Pt completed in-room functional mobility with and without rw. Rw utilized initially with pt noted to carry at times during pivoting. Pt requesting to try mobility with out rw as he often does athome. Pt is close min guard to min A at times with mobility both with and without AD today as pt is not usng rw fully during session. Pt able to don socks in sitting position. Educated on energy conservation and fall prevention with spouse present. Pt cued to slow down during mobility. Narrow BOS with slight posterior lean to wash hands at sink using sink  for support at hips. Swaying noted with static standing balance.     Vision     Perception     Praxis      Pertinent Vitals/Pain Pain Assessment: Faces Faces Pain Scale: Hurts little more Pain Location: forehead Pain Intervention(s):  Monitored during session;Repositioned     Hand Dominance     Extremity/Trunk Assessment Upper Extremity Assessment Upper Extremity Assessment: Generalized weakness;Overall [facility] for tasks assessed   Lower Extremity Assessment Lower Extremity Assessment: Defer to PT evaluation       Communication Communication Communication: No difficulties   Cognition Arousal/Alertness: Awake/alert Behavior During Therapy: WFL for tasks assessed/performed Overall Cognitive Status: Within Functional Limits for tasks assessed                     General Comments       Exercises       Shoulder Instructions      Home Living Family/patient expects to be discharged to:: Private residence Living Arrangements: Spouse/significant other Available Help at Discharge: Available 24 hours/day;Family Type of Home: House Home Access: Stairs to enter Entergy CorporationEntrance Stairs-Number of Steps: 1 Entrance Stairs-Rails: None Home Layout: One level     Bathroom Shower/Tub: Walk-in shower;Door   Foot LockerBathroom Toilet: Handicapped height     Home Equipment: Information systems managerhower seat;Wheelchair - power;Wheelchair - manual;Grab bars - tub/shower;Walker - 2 wheels;Cane - single point          Prior Functioning/Environment Level of Independence: Independent with assistive device(s)        Comments: Pt reports he uses SPC, rw, or furniture walked around his home at baseline. Pt used w/c for outside the home and reports he uses it on occasion in the home.    OT Diagnosis: Generalized weakness   OT Problem List: Decreased strength;Decreased activity tolerance;Impaired balance (sitting and/or standing);Decreased safety awareness;Decreased knowledge of use of DME or AE;Decreased knowledge of precautions;Cardiopulmonary status limiting activity   OT Treatment/Interventions: Self-care/ADL training;Energy conservation;DME and/or AE instruction;Therapeutic activities;Balance training;Patient/family education    OT Goals(Current  goals can be found in the care plan section) Acute Rehab OT Goals Patient Stated Goal: not stated  OT Goal Formulation: With patient/family Time For Goal Achievement: 08/14/15 Potential to Achieve Goals: Good ADL Goals Pt Will Perform Lower Body Bathing: with modified independence;sit to/from stand Pt Will Perform Lower Body Dressing: with modified independence;sit to/from stand Pt Will Transfer to Toilet: with modified independence;ambulating (3n1 over toilet) Pt Will Perform Tub/Shower Transfer: Shower transfer;with modified independence;ambulating;3 in 1;rolling walker;shower seat Additional ADL Goal #1: Pt will incorporate 1 energy conservation strategy into ADL task with only initial cueing provided.   OT Frequency: Min 2X/week   Barriers to D/C:            Co-evaluation              End of Session Equipment Utilized During Treatment: Gait belt;Rolling walker;Oxygen  Activity Tolerance: Patient tolerated treatment well;Other (comment) (SOB with mobility. ) Patient left: in bed;with call bell/phone within reach;with bed alarm set;with family/visitor present   Time: 1050-1115 OT Time Calculation (min): 25 min Charges:  OT General Charges $OT Visit: 1 Procedure OT Evaluation $Initial OT Evaluation Tier I: 1 Procedure OT Treatments $Self Care/Home Management : 8-22 mins G-Codes:    Pilar GrammesMathews, Adyn Serna H 08/07/2015, 11:35 AM

## 2015-08-07 NOTE — Evaluation (Signed)
Physical Therapy Evaluation Patient Details Name: Mark DakinGrady J Penix MRN: [id number] DOB: 12/29/1940 Today's Date: 08/07/2015   History of Present Illness  Pt is an 74 y.o. male with known CVA 6 months ago and followed by Dr. Roda ShuttersXu as out patient. Pt presents s/p syncope with fall while walking to bathroom. Pt experienced confusion lasting for about 10 minutes.   Clinical Impression  Pt admitted with/for fall with confusion afterward lasting ~10 min.  Pt currently limited functionally due to the problems listed below.  (see problems list.)  Pt will benefit from PT to maximize function and safety to be able to get home safely with available assist of family.     Follow Up Recommendations Home health PT    Equipment Recommendations  None recommended by PT    Recommendations for Other Services       Precautions / Restrictions Precautions Precautions: Fall Restrictions Weight Bearing Restrictions: No      Mobility  Bed Mobility Overal bed mobility: Modified Independent                Transfers Overall transfer level: Needs assistance Equipment used:  (pushed portable O2 tank) Transfers: Sit to/from Stand Sit to Stand: Min guard            Ambulation/Gait Ambulation/Gait assistance: Supervision Ambulation Distance (Feet): 90 Feet (x2 ) Assistive device: None (pulling portable O2) Gait Pattern/deviations: Step-through pattern     General Gait Details: generally steady, with noticeable pt of fatigue that coincides with desaturation.    Stairs            Wheelchair Mobility    Modified Rankin (Stroke Patients Only)       Balance Overall balance assessment: Needs assistance   Sitting balance-Leahy Scale: Good       Standing balance-Leahy Scale: Fair                               Pertinent Vitals/Pain Pain Assessment: Faces Pain Location: forehead Pain Descriptors / Indicators: Aching;Sore Pain Intervention(s): Monitored during session     Home Living Family/patient expects to be discharged to:: Private residence Living Arrangements: Spouse/significant other Available Help at Discharge: Available 24 hours/day;Family Type of Home: House Home Access: Stairs to enter Entrance Stairs-Rails: None Entrance Stairs-Number of Steps: 1 Home Layout: One level Home Equipment: Shower seat;Wheelchair - power;Wheelchair - manual;Grab bars - tub/shower;Walker - 2 wheels;Cane - single point      Prior Function Level of Independence: Independent with assistive device(s)         Comments: Pt reports he uses SPC, rw, or furniture walked around his home at baseline. Pt used w/c for outside the home and reports he uses it on occasion in the home.     Hand Dominance        Extremity/Trunk Assessment               Lower Extremity Assessment: Overall WFL for tasks assessed (proximal weakness bil)         Communication   Communication: No difficulties  Cognition Arousal/Alertness: Awake/alert Behavior During Therapy: WFL for tasks assessed/performed Overall Cognitive Status: Within Functional Limits for tasks assessed                      General Comments General comments (skin integrity, edema, etc.): Initially pt unaware that he was on RA >851min and sats were ~72%  RHR in the 90's.  3-4  minutes to recover on 3L [place].  During ambulation on 4L  SpO2  dropped to 82% and EHR 115 bpm.. The return trip on 4L dropped to 84% and 110 bpm    Exercises        Assessment/Plan    PT Assessment Patient needs continued PT services  PT Diagnosis Generalized weakness;Difficulty walking   PT Problem List Decreased strength;Decreased activity tolerance;Decreased balance;Decreased knowledge of use of DME;Cardiopulmonary status limiting activity  PT Treatment Interventions DME instruction;Gait training;Stair training;Functional mobility training;Therapeutic activities;Balance training;Patient/family education   PT Goals  (Current goals can be found in the Care Plan section) Acute Rehab PT Goals Patient Stated Goal: not stated  PT Goal Formulation: With patient Time For Goal Achievement: 08/14/15 Potential to Achieve Goals: Good    Frequency Min 3X/week   Barriers to discharge        Co-evaluation               End of Session   Activity Tolerance: Patient limited by fatigue;Patient tolerated treatment well Patient left: in bed;with family/visitor present;with call bell/phone within reach;Other (comment) (sitting EOB) Nurse Communication: Mobility status         Time: 4098-1191 PT Time Calculation (min) (ACUTE ONLY): 25 min   Charges:   PT Evaluation $Initial PT Evaluation Tier I: 1 Procedure PT Treatments $Gait Training: 8-22 mins   PT G Codes:        Gerre Ranum, Eliseo Gum 08/07/2015, 3:30 PM  08/07/2015  Houghton Bing, PT [phone]  (pager)

## 2015-08-07 NOTE — Progress Notes (Signed)
OT Cancellation Note  Patient Details Name: Orlie DakinGrady J Muzyka MRN: [id number] DOB: 03/15/1941   Cancelled Treatment:    Reason Eval/Treat Not Completed: Patient at procedure or test/ unavailable. Pt at echocardiogram. OT to reattempt as schedule permits.   Pilar GrammesMathews, Buffi Ewton H 08/07/2015, 9:02 AM

## 2015-08-07 NOTE — Progress Notes (Signed)
Family Medicine Teaching Service Daily Progress Note Intern Pager: [phone]  Patient name: Mark DakinGrady J [name] Medical record number: [id number] Date of birth: 06/07/1941 Age: 74 y.o. Gender: male  Primary Care Provider: Kaleen MaskELKINS,WILSON OLIVER, MD Consultants: Neuro, Cards Code Status: Full  Pt Overview and Major Events to Date:  12/12: Admitted for fall/syncope  Assessment and Plan: Mark [name] is a 74 y.o. male presenting with fall . PMH is significant for COPD, afib, CVA.  # Fall/syncope: LOC w/o prodrome, multiple injuries including tongue lac concerning for seizure, no incontinence, also with arrhythmia history. Likely multifactorial, differentials include seizure vs CVA vs hypoxia (off home O2) vs arrythmia. Initial EKG with sinus rhythm with PACs. CT and MRI of head showed old MCA infarct; no acute processes. - monitor on telemetry - echo 01/18/15 with normal EF and grade 1 diastolic dysfunction, repeat pending - repeat EKG this morning NSR - Trop negative x1 - consulted neurology and cardiology (patient sees Roda ShuttersXu and Graciela HusbandsKlein respectively), appreciate their recommendations   -EEG was normal   -neuro started Keppra BID  -Verapimil started for tachycardia; tachycardia improved on medication  - PT/OT recs pending - orthostatic BPs negative - Fall precaution and seizure precaution.  # COPD: on home O2 and chronic steroids, reports cough/congestion x2 weeks, ED CXR without acute findings - continue home brovana, albuterol, prednisone, umeclidinium-vilanterol. Stable - mucinex prn cough - if increased O2 requirement consider increasing steroids and adding antibiotic for exacerbation, will hold off for now  # Atrial fibrillation: current sinus rhythm. CHADS2VASC of at least 3. Maintaining NSR. - continue home propafenone, verapamil, eliquis - cardiology consulted as above  # BPH - continue alfuzosin  FEN/GI: heart healthy diet, SLIV Prophylaxis: on eliquis  Disposition: pending  further work-up and consult recs. Possible discharge today.  Subjective:  Doing well has no complaints. Wants to make sure bruise on his foot is okay. No shortness of breath or chest pain. No more syncopal symptoms.  Objective: Temp:  [97.4 F (36.3 C)-98.4 F (36.9 C)] 98.4 F (36.9 C) (12/13 0343) Pulse Rate:  [92-133] 92 (12/13 0343) Resp:  [10-20] 17 (12/13 0343) BP: (100-135)/(54-94) 111/68 mmHg (12/13 0343) SpO2:  [93 %-99 %] 98 % (12/13 0343) Weight:  [162 lb 12.8 oz (73.846 kg)-163 lb 1.6 oz (73.982 kg)] 163 lb 1.6 oz (73.982 kg) (12/13 0343) Physical Exam: General: elderly male, crusted blood on head and arms, NAD ENTM: MMM, 2cm central forehead lac, large hematoma on right side of tongue Cardiovascular: distant heart sounds but RRR, no MRG Respiratory: [place] in place, normal work of rbeathing, poor air movement throughout but otherwise CTAB, no wheezes or crackles appreciated Abdomen: mild epigastric tenderness, soft, ND, no HSM MSK: 2cm lacerations on bilateral upper arms, bruising on bilateral arms and legs Skin: thin and dry, no rashes, lacs as above, multiple bruises Neuro: alert and oriented, no focal deficits  Laboratory:  Recent Labs Lab 08/06/15 0805 08/07/15 0410  WBC 17.3* 12.4*  HGB 13.0 11.7*  HCT 41.3 38.8*  PLT 195 213    Recent Labs Lab 08/06/15 0843 08/07/15 0410  NA 139 139  K 3.8 3.8  CL 102 101  CO2 29 29  BUN 17 14  CREATININE 0.81 0.93  CALCIUM 8.7* 8.9  PROT 6.3*  --   BILITOT 0.6  --   ALKPHOS 36*  --   ALT 22  --   AST 19  --   GLUCOSE 95 172*   Urinalysis    Component Value  Date/Time   COLORURINE YELLOW 08/06/2015 0918   APPEARANCEUR CLOUDY* 08/06/2015 0918   LABSPEC 1.022 08/06/2015 0918   PHURINE 5.5 08/06/2015 0918   GLUCOSEU 250* 08/06/2015 0918   HGBUR MODERATE* 08/06/2015 0918   BILIRUBINUR NEGATIVE 08/06/2015 0918   KETONESUR NEGATIVE 08/06/2015 0918   PROTEINUR 30* 08/06/2015 0918   UROBILINOGEN 0.2 10/30/2012  1429   NITRITE NEGATIVE 08/06/2015 0918   LEUKOCYTESUR NEGATIVE 08/06/2015 0918    Trop neg x1 CK 258  Imaging/Diagnostic Tests: Dg Chest 2 View  08/06/2015  IMPRESSION: Stable chest with emphysema and chronic scarring. No acute findings demonstrated. Electronically Signed   By: Carey Bullocks M.D.   On: 08/06/2015 09:36   Ct Head Wo Contrast  08/06/2015  IMPRESSION: Old right MCA infarct.  No acute intracranial abnormality. Loss of normal cervical lordosis. Degenerative changes in the cervical spine as above. No acute bony abnormality. Electronically Signed   By: Charlett Nose M.D.   On: 08/06/2015 07:30   Ct Cervical Spine Wo Contrast  08/06/2015  IMPRESSION: Old right MCA infarct.  No acute intracranial abnormality. Loss of normal cervical lordosis. Degenerative changes in the cervical spine as above. No acute bony abnormality. Electronically Signed   By: Charlett Nose M.D.   On: 08/06/2015 07:30   Mr Brain Wo Contrast  08/06/2015  IMPRESSION: 1. Motion degraded, incomplete study. 2. Chronic right frontal lobe MCA infarct with 1 cm focus of superimposed acute ischemia along its superior margin. 3. Moderate cerebral atrophy and chronic small vessel ischemic disease. Electronically Signed   By: Sebastian Ache M.D.   On: 08/06/2015 15:40     Pincus Large, DO 08/07/2015, 7:27 AM PGY-2, [facility] FPTS Intern pager: [phone], text pages welcome

## 2015-08-08 DIAGNOSIS — Z8673 Personal history of transient ischemic attack (TIA), and cerebral infarction without residual deficits: Secondary | ICD-10-CM

## 2015-08-08 LAB — HEMOGLOBIN A1C
Hgb A1c MFr Bld: 6.3 % — ABNORMAL HIGH (ref 4.8–5.6)
Mean Plasma Glucose: 134 mg/dL

## 2015-08-08 MED ORDER — ATORVASTATIN CALCIUM 20 MG PO TABS: 20.0000 mg | ORAL_TABLET | Freq: Every day | ORAL | Status: DC

## 2015-08-08 MED ORDER — LEVETIRACETAM 500 MG PO TABS: 500.0000 mg | ORAL_TABLET | Freq: Two times a day (BID) | ORAL | Status: DC

## 2015-08-08 NOTE — Progress Notes (Signed)
Family Medicine Teaching Service Daily Progress Note Intern Pager: [phone]  Patient name: Mark [name] Medical record number: [id number] Date of birth: 06-26-1941 Age: 74 y.o. Gender: male  Primary Care Provider: [name], MD Consultants: Neuro, Cards Code Status: Full  Pt Overview and Major Events to Date:  12/12: Admitted for fall/syncope  Assessment and Plan: Mark [name] is a 74 y.o. male presenting with fall . PMH is significant for COPD, afib, CVA.  # Fall/syncope: LOC w/o prodrome, multiple injuries including tongue lac concerning for seizure, no incontinence, also with arrhythmia history. Likely multifactorial, differentials include seizure vs CVA vs hypoxia (off home O2) vs arrythmia. Initial EKG with sinus rhythm with PACs. CT and MRI of head showed old MCA infarct; no acute processes. Echo 01/18/15 with normal EF and grade 1 diastolic dysfunction. Repeat EKG (12/13) showed NSR. Orthostatics negative. Pt seen by neurology - pt with normal EEG, but given risk factors, neuro felt seizure prophylaxis was necessary and began Keppra BID. Cardiology recommended continuing verapamil for patient's tachycardia with subsequently improvement in HR. HR 80s-90s overnight. Pt seen by PT/OT who recommended home health PT.  - Repeat echo pending  - Cont Keppra per neuro and verapamil per cardiology - Fall and seizure precaution  # COPD: on home O2 and chronic steroids, reports cough/congestion x2 weeks, ED CXR without acute findings. Denies SOB today. - Continue home brovana, albuterol, prednisone, umeclidinium-vilanterol. Stable - Mucinex prn cough - If increased O2 requirement consider increasing steroids and adding antibiotic for exacerbation, will hold off for now  # Atrial fibrillation: current sinus rhythm. CHADS2VASC of at least 3. Maintaining NSR. Repeat EKG 12/13 confirmed NSR.  - Continue home propafenone, verapamil, eliquis - Cardiology consulted as above  # BPH -  Continue alfuzosin  FEN/GI: heart healthy diet, SLIV Prophylaxis: on eliquis  Disposition: Possible discharge today pending echo results.  Subjective:  Patient has no complaints this AM. Denies chest pain, SOB, dizziness, lightheadedness. Would like to go home.   Objective: Temp:  [97.8 F (36.6 C)-98.1 F (36.7 C)] 98.1 F (36.7 C) (12/14 0430) Pulse Rate:  [81-96] 96 (12/14 0430) Resp:  [16-18] 18 (12/14 0430) BP: (109-122)/(64-66) 109/64 mmHg (12/14 0430) SpO2:  [95 %-99 %] 96 % (12/14 0740) Weight:  [163 lb 11.2 oz (74.254 kg)] 163 lb 11.2 oz (74.254 kg) (12/14 0430) Physical Exam: General: elderly male, crusted blood on forehead and arms, sitting on side of bed eating breakfast in NAD ENTM: MMM, 2cm central forehead lac, large hematoma on right side of tongue, bruising under R eye Cardiovascular: distant heart sounds but RRR, no murmurs appreciated Respiratory: normal work of breathing, poor air movement throughout but otherwise CTAB, no wheezes  Abdomen: soft, non-tender, non-distended, +BS MSK: 2cm lacerations on bilateral upper arms, bruising on bilateral arms and legs Skin: thin and dry, no rashes, lacs as above, multiple bruises Neuro: alert and oriented, no focal deficits  Laboratory:  Recent Labs Lab 08/06/15 0805 08/07/15 0410  WBC 17.3* 12.4*  HGB 13.0 11.7*  HCT 41.3 38.8*  PLT 195 213    Recent Labs Lab 08/06/15 0843 08/07/15 0410  NA 139 139  K 3.8 3.8  CL 102 101  CO2 29 29  BUN 17 14  CREATININE 0.81 0.93  CALCIUM 8.7* 8.9  PROT 6.3*  --   BILITOT 0.6  --   ALKPHOS 36*  --   ALT 22  --   AST 19  --   GLUCOSE 95 172*  Urinalysis    Component Value Date/Time   COLORURINE YELLOW 08/06/2015 0918   APPEARANCEUR CLOUDY* 08/06/2015 0918   LABSPEC 1.022 08/06/2015 0918   PHURINE 5.5 08/06/2015 0918   GLUCOSEU 250* 08/06/2015 0918   HGBUR MODERATE* 08/06/2015 0918   BILIRUBINUR NEGATIVE 08/06/2015 0918   KETONESUR NEGATIVE 08/06/2015  0918   PROTEINUR 30* 08/06/2015 0918   UROBILINOGEN 0.2 10/30/2012 1429   NITRITE NEGATIVE 08/06/2015 0918   LEUKOCYTESUR NEGATIVE 08/06/2015 0918    Trop neg x1 CK 258  Imaging/Diagnostic Tests: Dg Chest 2 View  08/06/2015  IMPRESSION: Stable chest with emphysema and chronic scarring. No acute findings demonstrated. Electronically Signed   By: Carey BullocksWilliam  Veazey M.D.   On: 08/06/2015 09:36   Ct Head Wo Contrast  08/06/2015  IMPRESSION: Old right MCA infarct.  No acute intracranial abnormality. Loss of normal cervical lordosis. Degenerative changes in the cervical spine as above. No acute bony abnormality. Electronically Signed   By: Charlett NoseKevin  Dover M.D.   On: 08/06/2015 07:30   Ct Cervical Spine Wo Contrast  08/06/2015  IMPRESSION: Old right MCA infarct.  No acute intracranial abnormality. Loss of normal cervical lordosis. Degenerative changes in the cervical spine as above. No acute bony abnormality. Electronically Signed   By: Charlett NoseKevin  Dover M.D.   On: 08/06/2015 07:30   Mr Brain Wo Contrast  08/06/2015  IMPRESSION: 1. Motion degraded, incomplete study. 2. Chronic right frontal lobe MCA infarct with 1 cm focus of superimposed acute ischemia along its superior margin. 3. Moderate cerebral atrophy and chronic small vessel ischemic disease. Electronically Signed   By: Sebastian AcheAllen  Reyansh M.D.   On: 08/06/2015 15:40   Marquette SaaAbigail Joseph Rozalia Dino, MD 08/08/2015, 9:20 AM PGY-1, [facility] FPTS Intern pager: [phone], text pages welcome

## 2015-08-08 NOTE — Care Management Note (Signed)
Case Management Note  Patient Details  Name: Mark [name] MRN: [id number] Date of Birth: 08/18/1941  Subjective/Objective:         Admitted with fall, pre syncopal episode           Action/Plan: Patient lives at home with spouse. [facility] offered,[facility]up[facility]. Patient has a walker and cane at home.  Expected Discharge Date:     08/08/2015             Expected Discharge Plan:  Home w Home Health Services  Discharge planning Services  CM Consult  Post Acute Care Choice:  Home Health Choice offered to:  Patient  HH Arranged:  PT, OT HH Agency:  [facility]  Status of Service:  In process, will continue to follow  Reola MosherChandler, Cabell Lazenby L, RN,MHA,BSN [phone] 08/08/2015, 2:14 PM

## 2015-08-08 NOTE — Discharge Summary (Signed)
Family Medicine Teaching [facility] Discharge Summary  Patient name: Mark [name] Medical record number: [id number] Date of birth: [birth year] Age: 74 y.o. Gender: male Date of Admission: 08/06/2015  Date of Discharge: 08/08/15 Admitting Physician: Latrelle Dodrill, MD  Primary Care Provider: [name], MD Consultants: neurology, cardiology  Indication for Hospitalization: fall/syncope  Discharge Diagnoses/Problem List:  Patient Active Problem List   Diagnosis Date Noted  . Fall- possible syncopal episode 08/06/2015  . Essential hypertension 08/06/2015  . Seizure (HCC)   . Faintness   . Hyperglycemia 01/23/2015  . History of CVA (cerebrovascular accident) 5/16 01/23/2015  . Numbness and tingling in right hand 12/14/2012  . Clotted chest tube 10/30/2012  . Lung mass 10/07/2012  . S/P thoracotomy 08/31/2012  . Spontaneous pneumothorax 08/10/2012  . TREMOR 10/16/2009  . HLD (hyperlipidemia) 10/15/2009  . PAF (paroxysmal atrial fibrillation) (HCC) 10/15/2009  . WEAKNESS 09/14/2009  . DYSPNEA 10/12/2008  . Seasonal and perennial allergic rhinitis 12/24/2007  . COPD with chronic bronchitis-oxygen dependent 12/24/2007  . OSTEOPENIA 12/24/2007  . Headache(784.0) 12/24/2007     Disposition: home with home health  Discharge Condition: stable  Discharge Exam:  General: elderly male, crusted blood on forehead and arms, sitting on side of bed eating breakfast in NAD ENTM: MMM, 2cm central forehead lac, large hematoma on right side of tongue, bruising under R eye Cardiovascular: distant heart sounds but RRR, no murmurs appreciated Respiratory: normal work of breathing, poor air movement throughout but otherwise CTAB, no wheezes  Abdomen: soft, non-tender, non-distended, +BS MSK: 2cm lacerations on bilateral upper arms, bruising on bilateral arms and legs Skin: thin and dry, no rashes, lacs as above, multiple bruises Neuro: alert and oriented, no focal  deficits  Brief Hospital Course:  Patient presented after a fall at home with subsequent LOC. Pt has history of CVA 6 months ago, so there was concern for seizure as cause of fall. He was subsequently admitted for further work-up.   Fall Patient was seen by neurology, who recommended beginning Keppra given patient's risk factors for seizures. They also recommended EEG and MRI, both of which showed no abnormalities. As patient hit his head when he fell, CT head was also performed, which showed an old R MCA infarct but no other abnormalities. Despite negative EEG, neurology felt that most likely cause of fall was unwitnessed seizure, and patient should continue on anti-seizure prophylaxis. He was sent home on Keppra 500 mg BID with scheduled neurology outpatient follow-up.   Pt also seen by cardiology. EKG showed sinus rhythm, and as patient denied a prodrome prior to the incident, arrhythmia seemed unlikely cause. Echo also showed no abnormalities. Cardiology considered a loop recorder, however since this is an isolated incident, watchful waiting was preferred and no recorder was placed. As he had no additional symptoms and cardiac workup was negative, he was deemed stable for discharge by cardiology.   Issues for Follow Up:  1. Despite fall, patient continued on apixaban per cardiology recommendations.  2. Patient currently on only 20 mg Lipitor. Recommended increasing to 40 mg qd.   Significant Procedures: none  Significant Labs and Imaging:   Recent Labs Lab 08/06/15 0805 08/07/15 0410  WBC 17.3* 12.4*  HGB 13.0 11.7*  HCT 41.3 38.8*  PLT 195 213    Recent Labs Lab 08/06/15 0843 08/07/15 0410  NA 139 139  K 3.8 3.8  CL 102 101  CO2 29 29  GLUCOSE 95 172*  BUN 17 14  CREATININE 0.81 0.93  CALCIUM 8.7* 8.9  MG 2.6*  --   PHOS 3.3  --   ALKPHOS 36*  --   AST 19  --   ALT 22  --   ALBUMIN 3.3*  --     Dg Chest 2 View  08/06/2015  CLINICAL DATA:  Increasing shortness of  breath today. Fall with forehead laceration. EXAM: CHEST  2 VIEW COMPARISON:  Radiographs 05/03/2015 and 11/22/2014. FINDINGS: The heart size and mediastinal contours are stable. There is stable distortion of the right hilum secondary to previous partial right lung resection and associated scarring. There is stable pleural thickening at the right costophrenic angle. Diffuse emphysematous changes are present bilaterally. There is no evidence of superimposed airspace disease, edema, pleural effusion or pneumothorax. Old rib fractures/thoracotomy defects on the right are unchanged. IMPRESSION: Stable chest with emphysema and chronic scarring. No acute findings demonstrated. Electronically Signed   By: Carey Bullocks M.D.   On: 08/06/2015 09:36   Ct Head Wo Contrast  08/06/2015  CLINICAL DATA:  Syncope this morning.  Hit forehead. EXAM: CT HEAD WITHOUT CONTRAST CT CERVICAL SPINE WITHOUT CONTRAST TECHNIQUE: Multidetector CT imaging of the head and cervical spine was performed following the standard protocol without intravenous contrast. Multiplanar CT image reconstructions of the cervical spine were also generated. COMPARISON:  MRI 01/11/2015 FINDINGS: CT HEAD FINDINGS Old right frontal infarct with encephalomalacia. Mild chronic small vessel disease throughout the deep white matter. Age related volume loss. No acute infarction, hemorrhage or hydrocephalus. No mass lesion or midline shift. No acute calvarial abnormality. Visualized paranasal sinuses and mastoids clear. Orbital soft tissues unremarkable. CT CERVICAL SPINE FINDINGS Severe diffuse degenerative disc disease throughout the cervical spine. Mild to moderate diffuse degenerative facet disease bilaterally. Loss of normal cervical lordosis. Slight anterolisthesis of C2 on C3 of 2 mm, likely related to facet disease. No visible fracture. No epidural or paraspinal hematoma. Carotid artery calcifications bilaterally. 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  No acute  intracranial abnormality. Loss of normal cervical lordosis. Degenerative changes in the cervical spine as above. No acute bony abnormality. Electronically Signed   By: Charlett Nose M.D.   On: 08/06/2015 07:30   Ct Cervical Spine Wo Contrast  08/06/2015  CLINICAL DATA:  Syncope this morning.  Hit forehead. EXAM: CT HEAD WITHOUT CONTRAST CT CERVICAL SPINE WITHOUT CONTRAST TECHNIQUE: Multidetector CT imaging of the head and cervical spine was performed following the standard protocol without intravenous contrast. Multiplanar CT image reconstructions of the cervical spine were also generated. COMPARISON:  MRI 01/11/2015 FINDINGS: CT HEAD FINDINGS Old right frontal infarct with encephalomalacia. Mild chronic small vessel disease throughout the deep white matter. Age related volume loss. No acute infarction, hemorrhage or hydrocephalus. No mass lesion or midline shift. No acute calvarial abnormality. Visualized paranasal sinuses and mastoids clear. Orbital soft tissues unremarkable. CT CERVICAL SPINE FINDINGS Severe diffuse degenerative disc disease throughout the cervical spine. Mild to moderate diffuse degenerative facet disease bilaterally. Loss of normal cervical lordosis. Slight anterolisthesis of C2 on C3 of 2 mm, likely related to facet disease. No visible fracture. No epidural or paraspinal hematoma. Carotid artery calcifications bilaterally. 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  No acute intracranial abnormality. Loss of normal cervical lordosis. Degenerative changes in the cervical spine as above. No acute bony abnormality. Electronically Signed   By: Charlett Nose M.D.   On: 08/06/2015 07:30   Mr Brain Wo Contrast  08/06/2015  CLINICAL DATA:  Syncope/seizure. Found on floor without recollection of falling. Transient confusion. Prior stroke  and atrial fibrillation. EXAM: MRI HEAD WITHOUT CONTRAST TECHNIQUE: Multiplanar, multiecho pulse sequences of the brain and surrounding structures were obtained without  intravenous contrast. COMPARISON:  Head CT 08/06/2015 and MRI 01/11/2015 FINDINGS: The examination had to be discontinued prior to completion due to patient's inability to continue lying flat due to difficulty breathing. Routine brain MRI sequences were obtained except for axial T1, axial FLAIR, and whole brain coronal T2 images. Coronal T2 images were obtained through the mesial temporal lobes, however they are mildly to moderately motion degraded precluding detailed evaluation. There is a chronic infarct in the right MCA territory predominantly involving the anterior frontal operculum. Along the superior margin of this chronic infarct is a 1 cm focus of restricted diffusion suggestive of superimposed acute ischemia. New T2 hyperintensity in the anterior limb of the right internal capsule and extending into the right cerebral peduncle likely reflects wallerian degeneration along the corticospinal tract. T2 hyperintensities elsewhere in the cerebral white matter and pons are similar to the prior MRI and nonspecific but compatible with moderate chronic small vessel ischemic disease. There is mild chronic ischemic change in the right thalamus. There is no evidence of acute intracranial hemorrhage, mass, midline shift, or extra-axial fluid collection. Prior bilateral cataract extraction is noted. Paranasal sinuses and mastoid air cells are clear. Abnormal distal right ICA flow void is again noted consistent with previously demonstrated occlusion. Cervical spine disc degeneration is partially visualized with spinal canal narrowing and spinal cord impression at C3-4. IMPRESSION: 1. Motion degraded, incomplete study. 2. Chronic right frontal lobe MCA infarct with 1 cm focus of superimposed acute ischemia along its superior margin. 3. Moderate cerebral atrophy and chronic small vessel ischemic disease. Electronically Signed   By: Sebastian AcheAllen  Arne M.D.   On: 08/06/2015 15:40    Results/Tests Pending at Time of Discharge:  none  Discharge Medications:    Medication List    STOP taking these medications        amoxicillin-clavulanate 875-125 MG tablet  Commonly known as:  AUGMENTIN     arformoterol 15 MCG/2ML Nebu  Commonly known as:  BROVANA      TAKE these medications        acetaminophen 325 MG tablet  Commonly known as:  TYLENOL  Take 650 mg by mouth every 6 (six) hours as needed for mild pain.     albuterol (2.5 MG/3ML) 0.083% nebulizer solution  Commonly known as:  PROVENTIL  USE ONE VIAL VIA NEBULIZER 4 TIMES DAILY     albuterol 108 (90 BASE) MCG/ACT inhaler  Commonly known as:  PROAIR HFA  Inhale 2 puffs into the lungs every 4 (four) hours as needed for wheezing or shortness of breath. For shortness of breath/wheezing     alendronate 70 MG tablet  Commonly known as:  FOSAMAX  Take 70 mg by mouth every 7 (seven) days. Takes on Tuesday Take with a full glass of water on an empty stomach.     alfuzosin 10 MG 24 hr tablet  Commonly known as:  UROXATRAL  Take 1 tablet by mouth daily.     apixaban 5 MG Tabs tablet  Commonly known as:  ELIQUIS  Take 1 tablet (5 mg total) by mouth 2 (two) times daily.     atorvastatin 20 MG tablet  Commonly known as:  LIPITOR  Take 1 tablet (20 mg total) by mouth daily.     CENTRUM SILVER PO  Take 1 tablet by mouth daily.     Compressor/Nebulizer Misc  Use as directed     EPINEPHrine 0.3 mg/0.3 mL Soaj injection  Commonly known as:  EPI-PEN  Inject 0.3 mLs (0.3 mg total) into the muscle once. For severe shortness of breath     furosemide 20 MG tablet  Commonly known as:  LASIX  TAKE 1 TABLET (20 MG TOTAL) BY MOUTH EVERY OTHER DAY.     guaiFENesin 600 MG 12 hr tablet  Commonly known as:  MUCINEX  Take 1-2 mg by mouth every 12 (twelve) hours as needed. For cough     levETIRAcetam 500 MG tablet  Commonly known as:  KEPPRA  Take 1 tablet (500 mg total) by mouth 2 (two) times daily.     magnesium oxide 400 MG tablet  Commonly known as:   MAG-OX  Take 1 tablet (400 mg total) by mouth daily.     omeprazole 20 MG capsule  Commonly known as:  PRILOSEC  Take 1 capsule (20 mg total) by mouth 2 (two) times daily.     ONE-A-DAY CALCIUM PLUS 500-50-100 MG-MG-UNIT Chew  Generic drug:  Calcium-Magnesium-Vitamin D  Chew 1 tablet by mouth 2 (two) times daily.     predniSONE 20 MG tablet  Commonly known as:  DELTASONE  TAKE 1 TABLET (20 MG TOTAL) BY MOUTH DAILY.     promethazine-codeine 6.25-10 MG/5ML syrup  Commonly known as:  PHENERGAN with CODEINE  TAKE 5 ML EVERY 6 HOURS AS NEEDED FOR COUGH     propafenone 225 MG tablet  Commonly known as:  RYTHMOL  TAKE ONE TABLET BY MOUTH TWICE A DAY     Umeclidinium-Vilanterol 62.5-25 MCG/INH Aepb  Commonly known as:  ANORO ELLIPTA  Inhale 1 puff into the lungs daily. RINSE MOUTH WELL AFTER USE     verapamil 240 MG CR tablet  Commonly known as:  CALAN-SR  Take 0.5 tablets (120 mg total) by mouth daily.        Discharge Instructions: Please refer to Patient Instructions section of EMR for full details.  Patient was counseled important signs and symptoms that should prompt return to medical care, changes in medications, dietary instructions, activity restrictions, and follow up appointments.   Follow-Up Appointments: Follow-up Information    Follow up with [name], MD On 08/17/2015.   Specialty:  Family Medicine   Why:  At 11:00am ... confirmed w/ [facility] information:   [address] [place] [address] [phone]       Follow up with SETHI,PRAMOD, MD On 08/15/2015.   Specialties:  Neurology, Radiology   Why:  At 7:30am ... with Dr. Lucia Gaskins  ... confirmed w/ [employer] information:   [address] [place] [address] [phone]       Follow up with [facility].   Why:  They will do your home health care at your home   Contact information:   [address] [place]  [address] [phone]       Marquette Saa, MD 08/08/2015, 3:21 PM PGY-1, [facility] Family Medicine

## 2015-08-08 NOTE — Progress Notes (Signed)
Occupational Therapy Treatment Patient Details Name: Mark [name] MRN: [id number] DOB: [birth year] Today's Date: 08/08/2015    History of present illness Pt is an 74 y.o. male with known CVA 6 months ago and followed by Dr. Roda Shutters as out patient. Pt presents s/p syncope with fall while walking to bathroom. Pt experienced confusion lasting for about 10 minutes.    OT comments  Pt progressing towards acute OT goals. Focus of session was energy conservation education and toilet transfers. Pt also completed household distance functional mobility. Session details below. OT to continue to follow. D/c plan remains appropriate.   Follow Up Recommendations  Home health OT;Supervision/Assistance - 24 hour    Equipment Recommendations  None recommended by OT    Recommendations for Other Services      Precautions / Restrictions Precautions Precautions: Fall Restrictions Weight Bearing Restrictions: No       Mobility Bed Mobility Overal bed mobility: Modified Independent                Transfers Overall transfer level: Needs assistance Equipment used: None Transfers: Sit to/from Stand Sit to Stand: Min guard         General transfer comment: from EOB and 3n1. min guard for safety.     Balance Overall balance assessment: Needs assistance Sitting-balance support: No upper extremity supported;Feet supported Sitting balance-Leahy Scale: Good Sitting balance - Comments: sitting EOB upon therapist arrival   Standing balance support: No upper extremity supported;During functional activity Standing balance-Leahy Scale: Fair Standing balance comment: able to complete toilet trasnfers and functional mobility with no AD. Pt wheeled O2 tank during mobility.                   ADL Overall ADL's : Needs assistance/impaired     Grooming: Wash/dry hands;Min Primary school teacher: Supervision/safety;Ambulation Toilet Transfer  Details (indicate cue type and reason): 3n1 over toilet         Functional mobility during ADLs: Supervision/safety General ADL Comments: Pt noted to be sitting EOB with [place] off upon therapist arrival. Pt reporting he had recently completed a sponge bath and O2 was not reapplied. O2 level was 88. Isabella donned. Pt completed household distance functional mobility with 1 5 minute seated rest break before returning to room. Pt O2 during rest break was 88. Pt on 4L for mobility. After rest break O2 was 95. O2 was around 92 upon return to room. Pt wheeling O2 tank and encouraged to take his time and remember breathing techniques. Pt completed toilet transfer as detailed above. Provided pt with energy conservation handout and reviewed along with fall prevention education.       Vision                     Perception     Praxis      Cognition   Behavior During Therapy: WFL for tasks assessed/performed Overall Cognitive Status: Within Functional Limits for tasks assessed                       Extremity/Trunk Assessment               Exercises     Shoulder Instructions       General Comments      Pertinent Vitals/ Pain       Pain Assessment: No/denies pain  Home Living  Prior Functioning/Environment              Frequency Min 2X/week     Progress Toward Goals  OT Goals(current goals can now be found in the care plan section)  Progress towards OT goals: Progressing toward goals  Acute Rehab OT Goals Patient Stated Goal: not stated  OT Goal Formulation: With patient/family Time For Goal Achievement: 08/14/15 Potential to Achieve Goals: Good ADL Goals Pt Will Perform Lower Body Bathing: with modified independence;sit to/from stand Pt Will Perform Lower Body Dressing: with modified independence;sit to/from stand Pt Will Transfer to Toilet: with modified independence;ambulating Pt Will Perform  Tub/Shower Transfer: Shower transfer;with modified independence;ambulating;3 in 1;rolling walker;shower seat Additional ADL Goal #1: Pt will incorporate 1 energy conservation strategy into ADL task with only initial cueing provided.   Plan Discharge plan remains appropriate    Co-evaluation                 End of Session Equipment Utilized During Treatment: Gait belt;Oxygen   Activity Tolerance Patient tolerated treatment well   Patient Left in bed;with call bell/phone within reach;with bed alarm set   Nurse Communication          Time: [id number] OT Time Calculation (min): 30 min  Charges: OT General Charges $OT Visit: 1 Procedure OT Treatments $Self Care/Home Management : 23-37 mins  Pilar GrammesMathews, Jamale Spangler H 08/08/2015, 10:01 AM

## 2015-08-08 NOTE — Progress Notes (Addendum)
Patient discharged per orders. Patient's wife at bedside for d/c teaching/instructions. Follow up appointments, medications, prescriptions, home care, wound care discussed. Discussed with patient the need to take a dose of oral keppra when he gets his prescription filled since he missed his IV dose today due to loss of IV access. Patient and wife verbalized understanding. Time allowed for questions/concerns. Patient and his wife stated they had none. Patient's wife will return shortly with O2 tank for patient transport home. Asher Muir- Lareen Mullings,RN   Pt left the unit via wheelchair with volunteer and wife.  Asher Muir- Wilburt Messina,RN

## 2015-08-15 ENCOUNTER — Encounter: Payer: Self-pay | Admitting: Neurology

## 2015-08-15 ENCOUNTER — Ambulatory Visit: Payer: Medicare Other | Admitting: Neurology

## 2015-08-15 ENCOUNTER — Ambulatory Visit (INDEPENDENT_AMBULATORY_CARE_PROVIDER_SITE_OTHER): Payer: Medicare Other | Admitting: Neurology

## 2015-08-15 VITALS — BP 106/56 | HR 78 | Resp 24 | Ht 71.0 in | Wt 171.0 lb

## 2015-08-15 DIAGNOSIS — I639 Cerebral infarction, unspecified: Secondary | ICD-10-CM | POA: Diagnosis not present

## 2015-08-15 DIAGNOSIS — I480 Paroxysmal atrial fibrillation: Secondary | ICD-10-CM | POA: Diagnosis not present

## 2015-08-15 DIAGNOSIS — W19XXXD Unspecified fall, subsequent encounter: Secondary | ICD-10-CM

## 2015-08-15 DIAGNOSIS — Z8673 Personal history of transient ischemic attack (TIA), and cerebral infarction without residual deficits: Secondary | ICD-10-CM

## 2015-08-15 NOTE — Progress Notes (Signed)
NAME: Mark [name] DOB: 12/27/1940  REASON FOR VISIT: syncope HISTORY FROM: pt and wife and chart  History: 08/15/2015  Mr. Pion is seen today accompanied by his wife for an episode of syncope and recent short hospitalization.  On 08/06/15, Mr. Hynes was walking back from the bathroom (walked 30 feet; just had BM) he passed out and lost consciousness x 1 minute.   No one witnessed fall/LOC but he needed help to get back up.    This was the only episode with syncope but he has had several episodes with lightheadedness the past year.    He does not recall having palpitations or racing heart at the time of any of the episodes.      He was recovering from a mild URI that occurred a few days before the episode of syncope but he does not think he was dehydrated.      In the hospital, he had MRI, ECHO and EEG.   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   ECHO showed no acute findings.    EEG was normal.  He has PAF and is on propafenone and verapamil.    He denies any more stroke like symptoms with any of these episodes.       With the stroke, he had confusion and some language difficulty but no weakness.   He recovered but wife notes he has trouble with confusion at times.     ______________________________________________________________________ Last note from Dr. Roda Shutters:  Mark [name] is a 74 y.o. male with PMH of PAF not on AC, severe COPD with O2 dependent. He stated that in 11/2014, he was in a restaurant and had sudden onset word finding difficulties for 3-4 min and resolved. However, after that, wife found him to have intermittent confusion with remote control, dial phone numbers, slow in response to questions and mess up with date or time. Denies any weakness, numbness, LOC, dizziness, swallowing difficulty or seizure. He went to see PCP 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 He was seen by cardiology on 01/12/15 and had 2D ehco which was unremarkable. He was put on eliquis.  He was found to have PAF in 2012 when he was admitted for pneumonia, and was put on coumadin briefly. However, he developed bruise at his arms and he optioned to be off AC. He was on ASA since then. He also had aflutter and s/p ablation. He was told that his aflutter was cured but still has afib. He still has severe COPD and needs home O2, he is following with [name] and on steroids.  He is a former smoker, quit many years ago. Occasionally drink and denies illicit drugs.  Interval History During the interval time, the patient has been doing the same. No recurrent stroke like symptoms. Had MRA head and neck, confirmed right ICA occlusion and right MCA via collaterals. However, also showed right VA and right M2 high grade stenosis. Still has COPD on O2. BP today 128/70 and advised to avoid hypotension. Still on eliquis and lipitor, tolerating well.   Past Medical History  Diagnosis Date  . Atrial fibrillation (HCC)   . Hyperlipidemia   . Headache(784.0)   . Osteopenia   . COPD (chronic obstructive pulmonary disease) (HCC)   . Allergic rhinitis   . History of echocardiogram  Echo 5/16:  EF 55-60%, no RWMA, Gr 1 DD  . Incontinence of urine     and stool  . Memory loss   . CVA (cerebral infarction)   . Shortness of breath dyspnea   . GERD (gastroesophageal reflux disease)    Past Surgical History  Procedure Laterality Date  . Catheter ablation    . Cataract extraction, bilateral    . Appendectomy  1958  . Inguinal hernia repair  1975, 1992  . Video assisted thoracoscopy (vats)/thorocotomy  08/17/2012    resection / stapling of blebs, mechanical pleurodesis   . Video assisted thoracoscopy  08/17/2012    Procedure: VIDEO ASSISTED THORACOSCOPY;  Surgeon: [name], MD;  Location: [facility] OR;  Service: Thoracic;  Laterality: Right;    . Resection of apical bleb  08/17/2012    Procedure: RESECTION OF APICAL BLEB;  Surgeon: [name], MD;  Location: [facility] OR;  Service: Thoracic;  Laterality: Right;  stapling of bleb  . Video bronchoscopy  08/27/2012    Procedure: VIDEO BRONCHOSCOPY;  Surgeon: [name], MD;  Location: [facility] OR;  Service: Thoracic;  Laterality: N/A;  evaluation of endobronchial valves   Family History  Problem Relation Age of Onset  . Heart attack Father   . COPD Father   . Lung cancer Father   . COPD Brother   . COPD Sister   . Stroke Mother   . Hypertension Brother   . Throat cancer Brother    Current Outpatient Prescriptions  Medication Sig Dispense Refill  . acetaminophen (TYLENOL) 325 MG tablet Take 650 mg by mouth every 6 (six) hours as needed for mild pain.     Marland Kitchen albuterol (PROAIR HFA) 108 (90 BASE) MCG/ACT inhaler Inhale 2 puffs into the lungs every 4 (four) hours as needed for wheezing or shortness of breath. For shortness of breath/wheezing 1 Inhaler prn  . albuterol (PROVENTIL) (2.5 MG/3ML) 0.083% nebulizer solution USE ONE VIAL VIA NEBULIZER 4 TIMES DAILY (Patient taking differently: USE ONE VIAL VIA NEBULIZER 4 TIMES DAILY as needed for wheezing or shortness of breath) 120 mL 4  . alendronate (FOSAMAX) 70 MG tablet Take 70 mg by mouth every 7 (seven) days. Takes on Tuesday Take with a full glass of water on an empty stomach.    Marland Kitchen alfuzosin (UROXATRAL) 10 MG 24 hr tablet Take 1 tablet by mouth daily.  11  . apixaban (ELIQUIS) 5 MG TABS tablet Take 1 tablet (5 mg total) by mouth 2 (two) times daily. 60 tablet 11  . atorvastatin (LIPITOR) 20 MG tablet Take 1 tablet (20 mg total) by mouth daily. 90 tablet 3  . Calcium-Magnesium-Vitamin D (ONE-A-DAY CALCIUM PLUS) 500-50-100 MG-MG-UNIT CHEW Chew 1 tablet by mouth 2 (two) times daily.      Marland Kitchen EPINEPHrine 0.3 mg/0.3 mL IJ SOAJ injection Inject 0.3 mLs (0.3 mg total) into the muscle once. For severe shortness of breath 1 Device prn  .  furosemide (LASIX) 20 MG tablet TAKE 1 TABLET (20 MG TOTAL) BY MOUTH EVERY OTHER DAY. 15 tablet 11  . guaiFENesin (MUCINEX) 600 MG 12 hr tablet Take 1-2 mg by mouth every 12 (twelve) hours as needed. For cough    . levETIRAcetam (KEPPRA) 500 MG tablet Take 1 tablet (500 mg total) by mouth 2 (two) times daily. 60 tablet 0  . magnesium oxide (MAG-OX) 400 MG tablet Take 1 tablet (400 mg total) by mouth daily.    . Multiple Vitamins-Minerals (CENTRUM SILVER PO) Take 1 tablet  by mouth daily.      . Nebulizers (COMPRESSOR/NEBULIZER) MISC Use as directed 1 each 0  . omeprazole (PRILOSEC) 20 MG capsule Take 1 capsule (20 mg total) by mouth 2 (two) times daily. 60 capsule 1  . predniSONE (DELTASONE) 20 MG tablet TAKE 1 TABLET (20 MG TOTAL) BY MOUTH DAILY. 30 tablet 5  . promethazine-codeine (PHENERGAN WITH CODEINE) 6.25-10 MG/5ML syrup TAKE 5 ML EVERY 6 HOURS AS NEEDED FOR COUGH 200 mL 0  . propafenone (RYTHMOL) 225 MG tablet TAKE ONE TABLET BY MOUTH TWICE A DAY 180 tablet 3  . Umeclidinium-Vilanterol (ANORO ELLIPTA) 62.5-25 MCG/INH AEPB Inhale 1 puff into the lungs daily. RINSE MOUTH WELL AFTER USE 3 each 3  . verapamil (CALAN-SR) 240 MG CR tablet Take 0.5 tablets (120 mg total) by mouth daily. 15 tablet 11   No current facility-administered medications for this visit.   Allergies  Allergen Reactions  . Shrimp [Shellfish Allergy] Diarrhea and Nausea And Vomiting  . Zolpidem Tartrate     disoriented   Social History   Social History  . Marital Status: Married    Spouse Name: Windy Kalata  . Number of Children: 6  . Years of Education: 8   Occupational History  . Retired     Air traffic controller   Social History Main Topics  . Smoking status: Former Smoker -- 3.00 packs/day for 15 years    Types: Cigarettes  . Smokeless tobacco: Former Neurosurgeon    Quit date: 09/01/1965  . Alcohol Use: No  . Drug Use: No  . Sexual Activity: Not on file   Other Topics Concern  . Not on file    Social History Narrative   Married, 6 children   Right handed   Caffeine use - soda 2 daily    Review of Systems Full 14 system review of systems performed and notable only for those listed, all others are neg:  Constitutional:  Fever/chills, fatigue Cardiovascular:  Ear/Nose/Throat:  Runny nose, trouble swallowing, drooling Skin:  Eyes:  Eye itching and eye redness Respiratory:  SOB, wheezing, cough Gastroitestinal:  Genitourinary: Hematology/Lymphatic:   Endocrine: Feeling cold/hot Musculoskeletal:  Allergy/Immunology:   Neurological:   Psychiatric:  Sleep:   Physical Exam  Filed Vitals:   08/15/15 0835  BP: 106/56  Pulse: 78  Resp: 24    General - Well nourished, well developed, on portable O2, no acute distress.  Cardiovascular - Regular rate and rhythm, no afib rhythm.  No change in pulse with standing  Mental Status -  Level of arousal and orientation to time, place, and person were intact. Language including expression, naming, repetition, comprehension, reading, and writing was assessed and found intact. Attention span and concentration were normal.    Fund of Knowledge was assessed and was intact.  Cranial Nerves II - XII - II - Visual field intact OU. III, IV, VI - Extraocular movements intact. VII - Facial movement intact bilaterally. VIII - Hearing & vestibular intact bilaterally. X - Palate elevates symmetrically. XI - Chin turning & shoulder shrug intact bilaterally. XII - Tongue protrusion intact.  Motor Strength - The patient's strength was normal in all extremities and pronator drift was absent.  Bulk was normal.   Motor Tone - Muscle tone was assessed at the neck and appendages and was normal.  Reflexes - The patient's reflexes were symmetrical in all extremities   Coordination - The patient had normal movements in the hands with no ataxia or dysmetria.  Tremor was absent.  Gait and Station - he is able to stand unassisted.       Assessment and Plan:   History of CVA (cerebrovascular accident) 5/16  Fall, subsequent encounter  PAF (paroxysmal atrial fibrillation) (HCC)   1.    Review of data from his recent hospitalization showed mild hyperintensity on diffusion-weighted images adjacent to the old stroke. It is uncertain if this represents mild extension or shine through artifact. Continue current medications. 2.    EEG was normal. Episode does not have characteristics of seizure and I will stop the Keppra. 3.   Advised to remain standing for about 1 minute before he begins walking. 4.   Return in 3 or 4 months.

## 2015-08-30 ENCOUNTER — Telehealth: Payer: Self-pay | Admitting: Internal Medicine

## 2015-08-30 NOTE — Telephone Encounter (Signed)
New Message  Home PT calling to see if orders can be put in for home nurse visit. Please call back and discuss.

## 2015-08-30 NOTE — Telephone Encounter (Signed)
[facility] Dr. Graciela HusbandsKlein would put an order for pt to have home PT. Pt has not seen Dr. Graciela HusbandsKlein since in September prior hospitalization in December 2016. Pt was seen by [facility] on 08/15/15. Corrie DandyMary was made aware to call neuro for orders . Mary verbalized understanding.

## 2015-09-25 ENCOUNTER — Other Ambulatory Visit: Payer: Self-pay | Admitting: Internal Medicine

## 2015-10-17 ENCOUNTER — Telehealth: Payer: Self-pay | Admitting: Internal Medicine

## 2015-10-17 NOTE — Telephone Encounter (Signed)
Called spoke with spouse and made of recs. She verbalized understanding and had no questions

## 2015-10-17 NOTE — Telephone Encounter (Signed)
Alkaseltzer may aggravate bloating. Suggest change to something like Maalox or Mylanta and see if that helps with bloating and the pain.

## 2015-10-17 NOTE — Telephone Encounter (Signed)
Called and spoke to pt. Pt c/o dry cough, discomfort under right breast when coughing, and difficulty exhaling x 2-3 days. Pt denies an increase in SOB, chest congestion, chest tightness, and f/c/s. Pt states he is taking alka seltzer for bloating. Pt unsure if this is respiratory related or gas.   CY please advise. Thanks.   Allergies  Allergen Reactions  . Shrimp [Shellfish Allergy] Diarrhea and Nausea And Vomiting  . Zolpidem Tartrate     disoriented    Current Outpatient Prescriptions on File Prior to Visit  Medication Sig Dispense Refill  . acetaminophen (TYLENOL) 325 MG tablet Take 650 mg by mouth every 6 (six) hours as needed for mild pain.     Marland Kitchen albuterol (PROAIR HFA) 108 (90 BASE) MCG/ACT inhaler Inhale 2 puffs into the lungs every 4 (four) hours as needed for wheezing or shortness of breath. For shortness of breath/wheezing 1 Inhaler prn  . albuterol (PROVENTIL) (2.5 MG/3ML) 0.083% nebulizer solution USE ONE VIAL VIA NEBULIZER 4 TIMES DAILY (Patient taking differently: USE ONE VIAL VIA NEBULIZER 4 TIMES DAILY as needed for wheezing or shortness of breath) 120 mL 4  . alendronate (FOSAMAX) 70 MG tablet Take 70 mg by mouth every 7 (seven) days. Takes on Tuesday Take with a full glass of water on an empty stomach.    Marland Kitchen alfuzosin (UROXATRAL) 10 MG 24 hr tablet Take 1 tablet by mouth daily.  11  . apixaban (ELIQUIS) 5 MG TABS tablet Take 1 tablet (5 mg total) by mouth 2 (two) times daily. 60 tablet 11  . atorvastatin (LIPITOR) 20 MG tablet Take 1 tablet (20 mg total) by mouth daily. 90 tablet 3  . Calcium-Magnesium-Vitamin D (ONE-A-DAY CALCIUM PLUS) 500-50-100 MG-MG-UNIT CHEW Chew 1 tablet by mouth 2 (two) times daily.      Marland Kitchen EPIPEN 2-PAK 0.3 MG/0.3ML SOAJ injection INJECT 0.3 MLS (0.3 MG TOTAL) INTO THE MUSCLE ONCE. FOR SEVERE SHORTNESS OF BREATH 1 Device 1  . furosemide (LASIX) 20 MG tablet TAKE 1 TABLET (20 MG TOTAL) BY MOUTH EVERY OTHER DAY. 15 tablet 11  . guaiFENesin (MUCINEX) 600  MG 12 hr tablet Take 1-2 mg by mouth every 12 (twelve) hours as needed. For cough    . magnesium oxide (MAG-OX) 400 MG tablet Take 1 tablet (400 mg total) by mouth daily.    . Multiple Vitamins-Minerals (CENTRUM SILVER PO) Take 1 tablet by mouth daily.      . Nebulizers (COMPRESSOR/NEBULIZER) MISC Use as directed 1 each 0  . omeprazole (PRILOSEC) 20 MG capsule Take 1 capsule (20 mg total) by mouth 2 (two) times daily. 60 capsule 1  . predniSONE (DELTASONE) 20 MG tablet TAKE 1 TABLET (20 MG TOTAL) BY MOUTH DAILY. 30 tablet 5  . promethazine-codeine (PHENERGAN WITH CODEINE) 6.25-10 MG/5ML syrup TAKE 5 ML EVERY 6 HOURS AS NEEDED FOR COUGH 200 mL 0  . propafenone (RYTHMOL) 225 MG tablet TAKE ONE TABLET BY MOUTH TWICE A DAY 180 tablet 3  . Umeclidinium-Vilanterol (ANORO ELLIPTA) 62.5-25 MCG/INH AEPB Inhale 1 puff into the lungs daily. RINSE MOUTH WELL AFTER USE 3 each 3  . verapamil (CALAN-SR) 240 MG CR tablet Take 0.5 tablets (120 mg total) by mouth daily. 15 tablet 11   No current facility-administered medications on file prior to visit.

## 2015-11-20 ENCOUNTER — Emergency Department (HOSPITAL_COMMUNITY): Payer: Medicare Other

## 2015-11-20 ENCOUNTER — Telehealth: Payer: Self-pay | Admitting: Internal Medicine

## 2015-11-20 ENCOUNTER — Encounter (HOSPITAL_COMMUNITY): Payer: Self-pay | Admitting: Cardiology

## 2015-11-20 ENCOUNTER — Inpatient Hospital Stay (HOSPITAL_COMMUNITY)
Admission: EM | Admit: 2015-11-20 | Discharge: 2015-11-28 | DRG: 199 | Disposition: A | Discharge status: Home Health | Payer: Medicare Other | Attending: Internal Medicine | Admitting: Internal Medicine

## 2015-11-20 DIAGNOSIS — Z825 Family history of asthma and other chronic lower respiratory diseases: Secondary | ICD-10-CM | POA: Diagnosis not present

## 2015-11-20 DIAGNOSIS — Z823 Family history of stroke: Secondary | ICD-10-CM | POA: Diagnosis not present

## 2015-11-20 DIAGNOSIS — I5033 Acute on chronic diastolic (congestive) heart failure: Secondary | ICD-10-CM | POA: Diagnosis present

## 2015-11-20 DIAGNOSIS — R0602 Shortness of breath: Secondary | ICD-10-CM | POA: Diagnosis present

## 2015-11-20 DIAGNOSIS — I10 Essential (primary) hypertension: Secondary | ICD-10-CM | POA: Diagnosis present

## 2015-11-20 DIAGNOSIS — R0600 Dyspnea, unspecified: Secondary | ICD-10-CM

## 2015-11-20 DIAGNOSIS — E785 Hyperlipidemia, unspecified: Secondary | ICD-10-CM | POA: Diagnosis present

## 2015-11-20 DIAGNOSIS — I4891 Unspecified atrial fibrillation: Secondary | ICD-10-CM | POA: Diagnosis not present

## 2015-11-20 DIAGNOSIS — J441 Chronic obstructive pulmonary disease with (acute) exacerbation: Secondary | ICD-10-CM | POA: Diagnosis present

## 2015-11-20 DIAGNOSIS — D72828 Other elevated white blood cell count: Secondary | ICD-10-CM | POA: Diagnosis present

## 2015-11-20 DIAGNOSIS — Z87891 Personal history of nicotine dependence: Secondary | ICD-10-CM

## 2015-11-20 DIAGNOSIS — Z7983 Long term (current) use of bisphosphonates: Secondary | ICD-10-CM | POA: Diagnosis not present

## 2015-11-20 DIAGNOSIS — Z7952 Long term (current) use of systemic steroids: Secondary | ICD-10-CM | POA: Diagnosis not present

## 2015-11-20 DIAGNOSIS — Z9981 Dependence on supplemental oxygen: Secondary | ICD-10-CM

## 2015-11-20 DIAGNOSIS — J9383 Other pneumothorax: Principal | ICD-10-CM | POA: Diagnosis present

## 2015-11-20 DIAGNOSIS — J4489 Other specified chronic obstructive pulmonary disease: Secondary | ICD-10-CM | POA: Diagnosis present

## 2015-11-20 DIAGNOSIS — Z8673 Personal history of transient ischemic attack (TIA), and cerebral infarction without residual deficits: Secondary | ICD-10-CM | POA: Diagnosis not present

## 2015-11-20 DIAGNOSIS — I493 Ventricular premature depolarization: Secondary | ICD-10-CM | POA: Diagnosis not present

## 2015-11-20 DIAGNOSIS — Z9889 Other specified postprocedural states: Secondary | ICD-10-CM

## 2015-11-20 DIAGNOSIS — Z91013 Allergy to seafood: Secondary | ICD-10-CM | POA: Diagnosis not present

## 2015-11-20 DIAGNOSIS — J432 Centrilobular emphysema: Secondary | ICD-10-CM

## 2015-11-20 DIAGNOSIS — J209 Acute bronchitis, unspecified: Secondary | ICD-10-CM | POA: Diagnosis not present

## 2015-11-20 DIAGNOSIS — Z8249 Family history of ischemic heart disease and other diseases of the circulatory system: Secondary | ICD-10-CM | POA: Diagnosis not present

## 2015-11-20 DIAGNOSIS — Z9842 Cataract extraction status, left eye: Secondary | ICD-10-CM | POA: Diagnosis not present

## 2015-11-20 DIAGNOSIS — I480 Paroxysmal atrial fibrillation: Secondary | ICD-10-CM | POA: Diagnosis present

## 2015-11-20 DIAGNOSIS — J939 Pneumothorax, unspecified: Secondary | ICD-10-CM | POA: Diagnosis present

## 2015-11-20 DIAGNOSIS — R739 Hyperglycemia, unspecified: Secondary | ICD-10-CM | POA: Diagnosis present

## 2015-11-20 DIAGNOSIS — I110 Hypertensive heart disease with heart failure: Secondary | ICD-10-CM | POA: Diagnosis present

## 2015-11-20 DIAGNOSIS — Z79899 Other long term (current) drug therapy: Secondary | ICD-10-CM

## 2015-11-20 DIAGNOSIS — J9311 Primary spontaneous pneumothorax: Secondary | ICD-10-CM

## 2015-11-20 DIAGNOSIS — J9621 Acute and chronic respiratory failure with hypoxia: Secondary | ICD-10-CM | POA: Diagnosis present

## 2015-11-20 DIAGNOSIS — F419 Anxiety disorder, unspecified: Secondary | ICD-10-CM

## 2015-11-20 DIAGNOSIS — M8580 Other specified disorders of bone density and structure, unspecified site: Secondary | ICD-10-CM | POA: Diagnosis present

## 2015-11-20 DIAGNOSIS — J449 Chronic obstructive pulmonary disease, unspecified: Secondary | ICD-10-CM | POA: Diagnosis present

## 2015-11-20 DIAGNOSIS — J9601 Acute respiratory failure with hypoxia: Secondary | ICD-10-CM | POA: Diagnosis not present

## 2015-11-20 DIAGNOSIS — T380X5A Adverse effect of glucocorticoids and synthetic analogues, initial encounter: Secondary | ICD-10-CM | POA: Diagnosis present

## 2015-11-20 DIAGNOSIS — Z7901 Long term (current) use of anticoagulants: Secondary | ICD-10-CM | POA: Diagnosis not present

## 2015-11-20 DIAGNOSIS — Z9841 Cataract extraction status, right eye: Secondary | ICD-10-CM | POA: Diagnosis not present

## 2015-11-20 DIAGNOSIS — K219 Gastro-esophageal reflux disease without esophagitis: Secondary | ICD-10-CM | POA: Diagnosis present

## 2015-11-20 DIAGNOSIS — Z888 Allergy status to other drugs, medicaments and biological substances status: Secondary | ICD-10-CM | POA: Diagnosis not present

## 2015-11-20 DIAGNOSIS — R0902 Hypoxemia: Secondary | ICD-10-CM

## 2015-11-20 LAB — BRAIN NATRIURETIC PEPTIDE: B Natriuretic Peptide: 27.3 pg/mL (ref 0.0–100.0)

## 2015-11-20 LAB — TROPONIN I: Troponin I: 0.06 ng/mL — ABNORMAL HIGH (ref <0.031)

## 2015-11-20 LAB — HEPARIN LEVEL (UNFRACTIONATED): Heparin Unfractionated: >2.2 IU/mL — ABNORMAL HIGH (ref 0.30–0.70)

## 2015-11-20 LAB — COMPREHENSIVE METABOLIC PANEL
ALT: 22 U/L (ref 17–63)
ANION GAP: 12 (ref 5–15)
AST: 24 U/L (ref 15–41)
Albumin: 3.7 g/dL (ref 3.5–5.0)
Alkaline Phosphatase: 56 U/L (ref 38–126)
BUN: 13 mg/dL (ref 6–20)
CHLORIDE: 101 mmol/L (ref 101–111)
CO2: 28 mmol/L (ref 22–32)
Calcium: 9 mg/dL (ref 8.9–10.3)
Creatinine, Ser: 1.01 mg/dL (ref 0.61–1.24)
GFR calc non Af Amer: >60 mL/min (ref >60)
Glucose, Bld: 250 mg/dL — ABNORMAL HIGH (ref 65–99)
POTASSIUM: 4.6 mmol/L (ref 3.5–5.1)
SODIUM: 141 mmol/L (ref 135–145)
Total Bilirubin: 0.6 mg/dL (ref 0.3–1.2)
Total Protein: 7.7 g/dL (ref 6.5–8.1)

## 2015-11-20 LAB — CBC WITH DIFFERENTIAL/PLATELET
BASOS ABS: 0 10*3/uL (ref 0.0–0.1)
Basophils Relative: 0 %
EOS ABS: 0 10*3/uL (ref 0.0–0.7)
Eosinophils Relative: 0 %
HCT: 43.2 % (ref 39.0–52.0)
Hemoglobin: 13 g/dL (ref 13.0–17.0)
LYMPHS ABS: 6.5 10*3/uL — AB (ref 0.7–4.0)
LYMPHS PCT: 35 %
MCH: 30.6 pg (ref 26.0–34.0)
MCHC: 30.1 g/dL (ref 30.0–36.0)
MCV: 101.6 fL — ABNORMAL HIGH (ref 78.0–100.0)
Monocytes Absolute: 0.9 10*3/uL (ref 0.1–1.0)
Monocytes Relative: 5 %
NEUTROS ABS: 11.2 10*3/uL — AB (ref 1.7–7.7)
Neutrophils Relative %: 60 %
PLATELETS: 305 10*3/uL (ref 150–400)
RBC: 4.25 MIL/uL (ref 4.22–5.81)
RDW: 14.3 % (ref 11.5–15.5)
WBC: 18.6 10*3/uL — ABNORMAL HIGH (ref 4.0–10.5)

## 2015-11-20 LAB — APTT: APTT: 26 s (ref 24–37)

## 2015-11-20 MED ORDER — PROMETHAZINE-CODEINE 6.25-10 MG/5ML PO SYRP
7.5000 mL | ORAL_SOLUTION | Freq: Four times a day (QID) | ORAL | Status: DC | PRN
  Filled 2015-11-20: qty 10

## 2015-11-20 MED ORDER — ALFUZOSIN HCL ER 10 MG PO TB24
10.0000 mg | ORAL_TABLET | Freq: Every day | ORAL | Status: DC
  Administered 2015-11-21 – 2015-11-28 (×8): 10 mg via ORAL
  Filled 2015-11-20 (×15): qty 1

## 2015-11-20 MED ORDER — HEPARIN (PORCINE) IN NACL 100-0.45 UNIT/ML-% IJ SOLN
1200.0000 [IU]/h | INTRAMUSCULAR | Status: DC
  Administered 2015-11-20: 1200 [IU]/h via INTRAVENOUS
  Administered 2015-11-21: 1500 [IU]/h via INTRAVENOUS
  Administered 2015-11-22 – 2015-11-23 (×3): 1200 [IU]/h via INTRAVENOUS
  Filled 2015-11-20 (×8): qty 250

## 2015-11-20 MED ORDER — MIRABEGRON ER 25 MG PO TB24
50.0000 mg | ORAL_TABLET | Freq: Every day | ORAL | Status: DC
  Administered 2015-11-21 – 2015-11-28 (×8): 50 mg via ORAL
  Filled 2015-11-20 (×10): qty 2
  Filled 2015-11-20: qty 1
  Filled 2015-11-20 (×4): qty 2

## 2015-11-20 MED ORDER — CALCIUM-MAGNESIUM-VITAMIN D 500-50-100 MG-MG-UNIT PO CHEW: 1.0000 | CHEWABLE_TABLET | Freq: Two times a day (BID) | ORAL | Status: DC

## 2015-11-20 MED ORDER — ATORVASTATIN CALCIUM 20 MG PO TABS
20.0000 mg | ORAL_TABLET | Freq: Every day | ORAL | Status: DC
  Administered 2015-11-21 – 2015-11-28 (×9): 20 mg via ORAL
  Filled 2015-11-20 (×6): qty 1
  Filled 2015-11-20: qty 2
  Filled 2015-11-20 (×2): qty 1

## 2015-11-20 MED ORDER — SODIUM CHLORIDE 0.9 % IV BOLUS (SEPSIS): 1000.0000 mL | Freq: Once | INTRAVENOUS | Status: DC

## 2015-11-20 MED ORDER — EPINEPHRINE 0.3 MG/0.3ML IJ SOAJ
0.3000 mg | Freq: Once | INTRAMUSCULAR | Status: DC
Start: 2015-11-20 — End: 2015-11-20

## 2015-11-20 MED ORDER — ALBUTEROL (5 MG/ML) CONTINUOUS INHALATION SOLN
10.0000 mg/h | INHALATION_SOLUTION | RESPIRATORY_TRACT | Status: DC
Start: 2015-11-20 — End: 2015-11-20
  Filled 2015-11-20: qty 20

## 2015-11-20 MED ORDER — GUAIFENESIN ER 600 MG PO TB12
1.0000 mg | ORAL_TABLET | Freq: Two times a day (BID) | ORAL | Status: DC | PRN
  Filled 2015-11-20: qty 1

## 2015-11-20 MED ORDER — ALENDRONATE SODIUM 70 MG PO TABS: 70.0000 mg | ORAL_TABLET | ORAL | Status: DC

## 2015-11-20 MED ORDER — HYDROCODONE-ACETAMINOPHEN 5-325 MG PO TABS
1.0000 | ORAL_TABLET | ORAL | Status: DC | PRN
  Administered 2015-11-21 – 2015-11-23 (×5): 2 via ORAL
  Administered 2015-11-24: 1 via ORAL
  Administered 2015-11-24 – 2015-11-27 (×5): 2 via ORAL
  Filled 2015-11-20 (×11): qty 2

## 2015-11-20 MED ORDER — SODIUM CHLORIDE 0.9% FLUSH
3.0000 mL | Freq: Two times a day (BID) | INTRAVENOUS | Status: DC
  Administered 2015-11-21 – 2015-11-28 (×15): 3 mL via INTRAVENOUS

## 2015-11-20 MED ORDER — METHYLPREDNISOLONE SODIUM SUCC 125 MG IJ SOLR: 125.0000 mg | Freq: Once | INTRAMUSCULAR | Status: DC

## 2015-11-20 MED ORDER — ALBUTEROL SULFATE (2.5 MG/3ML) 0.083% IN NEBU
2.5000 mg | INHALATION_SOLUTION | RESPIRATORY_TRACT | Status: DC | PRN
  Administered 2015-11-21 – 2015-11-26 (×3): 2.5 mg via RESPIRATORY_TRACT
  Filled 2015-11-20 (×3): qty 3

## 2015-11-20 MED ORDER — CALCIUM CARBONATE-VITAMIN D 500-200 MG-UNIT PO TABS
1.0000 | ORAL_TABLET | Freq: Every day | ORAL | Status: DC
  Administered 2015-11-21 – 2015-11-28 (×8): 1 via ORAL
  Filled 2015-11-20 (×9): qty 1

## 2015-11-20 MED ORDER — LEVALBUTEROL HCL 0.63 MG/3ML IN NEBU
0.6300 mg | INHALATION_SOLUTION | Freq: Four times a day (QID) | RESPIRATORY_TRACT | Status: DC
  Administered 2015-11-20 – 2015-11-21 (×2): 0.63 mg via RESPIRATORY_TRACT
  Filled 2015-11-20 (×4): qty 3

## 2015-11-20 MED ORDER — MORPHINE SULFATE (PF) 2 MG/ML IV SOLN
2.0000 mg | Freq: Once | INTRAVENOUS | Status: AC
  Administered 2015-11-20: 2 mg via INTRAVENOUS
  Filled 2015-11-20: qty 1

## 2015-11-20 MED ORDER — VERAPAMIL HCL ER 120 MG PO TBCR
120.0000 mg | EXTENDED_RELEASE_TABLET | Freq: Every day | ORAL | Status: DC
  Administered 2015-11-21 – 2015-11-23 (×4): 120 mg via ORAL
  Filled 2015-11-20 (×7): qty 1

## 2015-11-20 MED ORDER — ALBUTEROL SULFATE (2.5 MG/3ML) 0.083% IN NEBU: 2.5000 mg | INHALATION_SOLUTION | RESPIRATORY_TRACT | Status: DC

## 2015-11-20 MED ORDER — IPRATROPIUM BROMIDE 0.02 % IN SOLN
0.5000 mg | Freq: Once | RESPIRATORY_TRACT | Status: AC
Start: 2015-11-20 — End: 2015-11-20
  Administered 2015-11-20: 0.5 mg via RESPIRATORY_TRACT

## 2015-11-20 MED ORDER — MAGNESIUM SULFATE 2 GM/50ML IV SOLN
2.0000 g | Freq: Once | INTRAVENOUS | Status: AC
  Administered 2015-11-20: 2 g via INTRAVENOUS
  Filled 2015-11-20: qty 50

## 2015-11-20 MED ORDER — AZITHROMYCIN 500 MG PO TABS
500.0000 mg | ORAL_TABLET | Freq: Every day | ORAL | Status: DC
  Administered 2015-11-20 – 2015-11-22 (×3): 500 mg via ORAL
  Filled 2015-11-20: qty 1
  Filled 2015-11-20: qty 2
  Filled 2015-11-20: qty 1

## 2015-11-20 MED ORDER — DILTIAZEM HCL 25 MG/5ML IV SOLN
10.0000 mg | Freq: Four times a day (QID) | INTRAVENOUS | Status: DC | PRN
  Administered 2015-11-20 – 2015-11-21 (×2): 10 mg via INTRAVENOUS
  Filled 2015-11-20 (×3): qty 5

## 2015-11-20 MED ORDER — HEPARIN BOLUS VIA INFUSION
3000.0000 [IU] | Freq: Once | INTRAVENOUS | Status: AC
  Administered 2015-11-20: 3000 [IU] via INTRAVENOUS
  Filled 2015-11-20: qty 3000

## 2015-11-20 MED ORDER — INSULIN ASPART 100 UNIT/ML ~~LOC~~ SOLN: 0.0000 [IU] | Freq: Every day | SUBCUTANEOUS | Status: DC

## 2015-11-20 MED ORDER — FUROSEMIDE 40 MG PO TABS
40.0000 mg | ORAL_TABLET | Freq: Every day | ORAL | Status: DC
  Administered 2015-11-21 (×2): 40 mg via ORAL
  Filled 2015-11-20 (×2): qty 1

## 2015-11-20 MED ORDER — PROPAFENONE HCL 225 MG PO TABS
225.0000 mg | ORAL_TABLET | Freq: Two times a day (BID) | ORAL | Status: DC
  Administered 2015-11-21 – 2015-11-28 (×16): 225 mg via ORAL
  Filled 2015-11-20 (×23): qty 1

## 2015-11-20 MED ORDER — PANTOPRAZOLE SODIUM 40 MG PO TBEC
40.0000 mg | DELAYED_RELEASE_TABLET | Freq: Every day | ORAL | Status: DC
  Administered 2015-11-21 – 2015-11-28 (×9): 40 mg via ORAL
  Filled 2015-11-20 (×9): qty 1

## 2015-11-20 MED ORDER — ACETAMINOPHEN 325 MG PO TABS
650.0000 mg | ORAL_TABLET | Freq: Four times a day (QID) | ORAL | Status: DC | PRN
  Administered 2015-11-22: 650 mg via ORAL
  Filled 2015-11-20: qty 2

## 2015-11-20 MED ORDER — BUDESONIDE 0.5 MG/2ML IN SUSP
0.2500 mg | Freq: Four times a day (QID) | RESPIRATORY_TRACT | Status: DC
  Administered 2015-11-21: 0.25 mg via RESPIRATORY_TRACT
  Filled 2015-11-20 (×5): qty 2

## 2015-11-20 MED ORDER — ALBUTEROL SULFATE HFA 108 (90 BASE) MCG/ACT IN AERS: 2.0000 | INHALATION_SPRAY | RESPIRATORY_TRACT | Status: DC | PRN

## 2015-11-20 MED ORDER — INSULIN ASPART 100 UNIT/ML ~~LOC~~ SOLN
0.0000 [IU] | Freq: Three times a day (TID) | SUBCUTANEOUS | Status: DC
  Administered 2015-11-21 (×2): 1 [IU] via SUBCUTANEOUS
  Administered 2015-11-21: 2 [IU] via SUBCUTANEOUS
  Administered 2015-11-22: 3 [IU] via SUBCUTANEOUS
  Administered 2015-11-22: 1 [IU] via SUBCUTANEOUS
  Administered 2015-11-22: 3 [IU] via SUBCUTANEOUS
  Administered 2015-11-23 (×3): 1 [IU] via SUBCUTANEOUS
  Administered 2015-11-24: 2 [IU] via SUBCUTANEOUS
  Administered 2015-11-24 (×2): 1 [IU] via SUBCUTANEOUS
  Administered 2015-11-25 (×2): 2 [IU] via SUBCUTANEOUS
  Administered 2015-11-25: 1 [IU] via SUBCUTANEOUS
  Administered 2015-11-26: 2 [IU] via SUBCUTANEOUS
  Administered 2015-11-27: 5 [IU] via SUBCUTANEOUS
  Administered 2015-11-27 (×2): 1 [IU] via SUBCUTANEOUS
  Administered 2015-11-28: 2 [IU] via SUBCUTANEOUS

## 2015-11-20 MED ORDER — IPRATROPIUM BROMIDE 0.02 % IN SOLN
0.5000 mg | Freq: Once | RESPIRATORY_TRACT | Status: DC
  Filled 2015-11-20: qty 2.5

## 2015-11-20 MED ORDER — ONDANSETRON HCL 4 MG/2ML IJ SOLN: 4.0000 mg | Freq: Four times a day (QID) | INTRAMUSCULAR | Status: DC | PRN

## 2015-11-20 MED ORDER — METHYLPREDNISOLONE SODIUM SUCC 125 MG IJ SOLR
60.0000 mg | Freq: Three times a day (TID) | INTRAMUSCULAR | Status: DC
  Administered 2015-11-21 (×2): 60 mg via INTRAVENOUS
  Filled 2015-11-20 (×2): qty 0.96

## 2015-11-20 MED ORDER — IPRATROPIUM BROMIDE 0.02 % IN SOLN
0.5000 mg | Freq: Four times a day (QID) | RESPIRATORY_TRACT | Status: DC
  Administered 2015-11-20 – 2015-11-21 (×2): 0.5 mg via RESPIRATORY_TRACT
  Filled 2015-11-20 (×2): qty 2.5

## 2015-11-20 MED ORDER — MAGNESIUM OXIDE 400 (241.3 MG) MG PO TABS
400.0000 mg | ORAL_TABLET | Freq: Every day | ORAL | Status: DC
  Administered 2015-11-21 – 2015-11-23 (×3): 400 mg via ORAL
  Filled 2015-11-20 (×4): qty 1

## 2015-11-20 MED ORDER — ONDANSETRON HCL 4 MG PO TABS: 4.0000 mg | ORAL_TABLET | Freq: Four times a day (QID) | ORAL | Status: DC | PRN

## 2015-11-20 MED ORDER — LIDOCAINE HCL (PF) 1 % IJ SOLN
INTRAMUSCULAR | Status: AC
Start: 2015-11-20 — End: 2015-11-21
  Filled 2015-11-20: qty 10

## 2015-11-20 MED ORDER — HYDROMORPHONE HCL 1 MG/ML IJ SOLN
1.0000 mg | Freq: Once | INTRAMUSCULAR | Status: AC
  Administered 2015-11-20: 1 mg via INTRAVENOUS
  Filled 2015-11-20: qty 1

## 2015-11-20 MED ORDER — GUAIFENESIN ER 600 MG PO TB12
600.0000 mg | ORAL_TABLET | Freq: Two times a day (BID) | ORAL | Status: DC | PRN
  Administered 2015-11-22: 600 mg via ORAL
  Administered 2015-11-23: 1200 mg via ORAL
  Filled 2015-11-20: qty 1
  Filled 2015-11-20 (×2): qty 2

## 2015-11-20 MED ORDER — ALBUTEROL (5 MG/ML) CONTINUOUS INHALATION SOLN
10.0000 mg/h | INHALATION_SOLUTION | RESPIRATORY_TRACT | Status: DC
  Administered 2015-11-20: 10 mg/h via RESPIRATORY_TRACT

## 2015-11-20 MED ORDER — FENTANYL CITRATE (PF) 100 MCG/2ML IJ SOLN
100.0000 ug | Freq: Once | INTRAMUSCULAR | Status: AC
  Administered 2015-11-20: 100 ug via INTRAVENOUS
  Filled 2015-11-20: qty 2

## 2015-11-20 NOTE — H&P (Signed)
TRH H&P   Patient Demographics:    Mark [name], is a 75 y.o. male  MRN: [id number]   DOB - 05/28/1941  Admit Date - 11/20/2015  Outpatient Primary MD for the patient is Kaleen MaskELKINS,WILSON OLIVER, MD  Referring MD/NP/PA: Dr. Erin HearingMessner  Outpatient Specialists: Dr. Jetty Duhamellinton Young pulmonary, Dr. Sherryl MangesSteven Klein cardiology  [facility]  Chief Complaint  Patient presents with  . Shortness of Breath      HPI:    Mark PigeonGrady Balducci  is a 75 y.o. male, With history of COPD on 4 L nasal cannula oxygen at home along with 20 mg of prednisone chronically, spontaneous pneumothorax on the right side in the past requiring chest tube and thoracotomy 3 years ago, remote history of smoking, paroxysmal atrial fibrillation on Eliquis, history of chronic diastolic CHF EF 55-60% on last echo, stroke in the past, GERD, steroid-induced hyperglycemia, whose been experiencing cough productive of minimal amounts of phlegm at times, wheezing and increased shortness of breath for the last 1 week, comes to the hospital with left-sided chest pain and worsening shortness of breath.  In the ER workup consistent with left-sided spontaneous pneumothorax, COPD exacerbation, reactive leukocytosis, steroid-induced hyperglycemia, cardiothoracic surgeon was consulted, left-sided chest tube was placed, patient was still quite short of breath requiring nonrebreather oxygen and I was called to admit the patient. Besides above dictated review of systems all other review of systems are negative.    Review of systems:    In addition to the HPI above,  No Fever-chills, No Headache, No changes with Vision or hearing, No problems swallowing food or  Liquids, +ve Chest pain, +ve Cough & Shortness of Breath, No Abdominal pain, No Nausea or Vommitting, Bowel movements are regular, No Blood in stool or Urine, No dysuria, No new skin rashes or bruises, No new joints pains-aches,  No new weakness, tingling, numbness in any extremity, No recent weight gain or loss, No polyuria, polydypsia or polyphagia, No significant Mental Stressors.  A full 10 point Review of Systems was done, except as stated above, all other Review of Systems were negative.   With Past History of the following :    Past Medical History  Diagnosis Date  . Atrial fibrillation (HCC)   . Hyperlipidemia   . Headache(784.0)   . Osteopenia   . COPD (  chronic obstructive pulmonary disease) (HCC)   . Allergic rhinitis   . History of echocardiogram     Echo 5/16:  EF 55-60%, no RWMA, Gr 1 DD  . Incontinence of urine     and stool  . Memory loss   . CVA (cerebral infarction)   . Shortness of breath dyspnea   . GERD (gastroesophageal reflux disease)       Past Surgical History  Procedure Laterality Date  . Catheter ablation    . Cataract extraction, bilateral    . Appendectomy  1958  . Inguinal hernia repair  1975, 1992  . Video assisted thoracoscopy (vats)/thorocotomy  08/17/2012    resection / stapling of blebs, mechanical pleurodesis   . Video assisted thoracoscopy  08/17/2012    Procedure: VIDEO ASSISTED THORACOSCOPY;  Surgeon: [name], MD;  Location: [facility] OR;  Service: Thoracic;  Laterality: Right;  . Resection of apical bleb  08/17/2012    Procedure: RESECTION OF APICAL BLEB;  Surgeon: [name], MD;  Location: [facility] OR;  Service: Thoracic;  Laterality: Right;  stapling of bleb  . Video bronchoscopy  08/27/2012    Procedure: VIDEO BRONCHOSCOPY;  Surgeon: [name], MD;  Location: [facility] OR;  Service: Thoracic;  Laterality: N/A;  evaluation of endobronchial valves      Social History:     Social History  Substance Use Topics  . Smoking  status: Former Smoker -- 3.00 packs/day for 15 years    Types: Cigarettes  . Smokeless tobacco: Former Neurosurgeon    Quit date: 09/01/1965  . Alcohol Use: No     Lives -  at home with his wife, is currently mobile and walks by himself.     Family History :     Family History  Problem Relation Age of Onset  . Heart attack Father   . COPD Father   . Lung cancer Father   . COPD Brother   . COPD Sister   . Stroke Mother   . Hypertension Brother   . Throat cancer Brother        Home Medications:   Prior to Admission medications   Medication Sig Start Date End Date Taking? Authorizing Provider  acetaminophen (TYLENOL) 325 MG tablet Take 650 mg by mouth every 6 (six) hours as needed for mild pain.    Yes Historical Provider, MD  albuterol (PROAIR HFA) 108 (90 BASE) MCG/ACT inhaler Inhale 2 puffs into the lungs every 4 (four) hours as needed for wheezing or shortness of breath. For shortness of breath/wheezing 07/26/15  Yes Waymon Budge, MD  albuterol (PROVENTIL) (2.5 MG/3ML) 0.083% nebulizer solution USE ONE VIAL VIA NEBULIZER 4 TIMES DAILY Patient taking differently: USE ONE VIAL VIA NEBULIZER 4 TIMES DAILY as needed for wheezing or shortness of breath 07/13/14  Yes Waymon Budge, MD  alendronate (FOSAMAX) 70 MG tablet Take 70 mg by mouth every 7 (seven) days. Takes on Tuesday Take with a full glass of water on an empty stomach.   Yes Historical Provider, MD  alfuzosin (UROXATRAL) 10 MG 24 hr tablet Take 1 tablet by mouth daily. 07/11/15  Yes Historical Provider, MD  apixaban (ELIQUIS) 5 MG TABS tablet Take 1 tablet (5 mg total) by mouth 2 (two) times daily. 01/12/15  Yes Scott Moishe Spice, PA-C  atorvastatin (LIPITOR) 20 MG tablet Take 1 tablet (20 mg total) by mouth daily. 08/08/15  Yes Marquette Saa, MD  Calcium-Magnesium-Vitamin D (ONE-A-DAY CALCIUM PLUS) 500-50-100 MG-MG-UNIT CHEW Chew  1 tablet by mouth 2 (two) times daily.     Yes Historical Provider, MD  EPIPEN 2-PAK  0.3 MG/0.3ML SOAJ injection INJECT 0.3 MLS (0.3 MG TOTAL) INTO THE MUSCLE ONCE. FOR SEVERE SHORTNESS OF BREATH 09/25/15  Yes Waymon Budge, MD  furosemide (LASIX) 20 MG tablet TAKE 1 TABLET (20 MG TOTAL) BY MOUTH EVERY OTHER DAY. 05/14/15  Yes Duke Salvia, MD  guaiFENesin (MUCINEX) 600 MG 12 hr tablet Take 1-2 mg by mouth every 12 (twelve) hours as needed. For cough   Yes Historical Provider, MD  magnesium oxide (MAG-OX) 400 MG tablet Take 1 tablet (400 mg total) by mouth daily. 09/02/11  Yes Duke Salvia, MD  Multiple Vitamins-Minerals (CENTRUM SILVER PO) Take 1 tablet by mouth daily.     Yes Historical Provider, MD  MYRBETRIQ 50 MG TB24 tablet Take 1 tablet by mouth daily. 10/03/15  Yes Historical Provider, MD  Nebulizers (COMPRESSOR/NEBULIZER) MISC Use as directed 06/16/13  Yes Waymon Budge, MD  omeprazole (PRILOSEC) 20 MG capsule Take 1 capsule (20 mg total) by mouth 2 (two) times daily. 11/04/12  Yes Glori Luis, MD  predniSONE (DELTASONE) 20 MG tablet TAKE 1 TABLET (20 MG TOTAL) BY MOUTH DAILY. 06/26/15  Yes Waymon Budge, MD  promethazine-codeine (PHENERGAN WITH CODEINE) 6.25-10 MG/5ML syrup TAKE 5 ML EVERY 6 HOURS AS NEEDED FOR COUGH 07/30/15  Yes Waymon Budge, MD  propafenone (RYTHMOL) 225 MG tablet TAKE ONE TABLET BY MOUTH TWICE A DAY 01/24/15  Yes Scott T Weaver, PA-C  Umeclidinium-Vilanterol (ANORO ELLIPTA) 62.5-25 MCG/INH AEPB Inhale 1 puff into the lungs daily. RINSE MOUTH WELL AFTER USE 11/22/14  Yes Waymon Budge, MD  verapamil (CALAN-SR) 240 MG CR tablet Take 0.5 tablets (120 mg total) by mouth daily. 05/14/15  Yes Duke Salvia, MD     Allergies:     Allergies  Allergen Reactions  . Shrimp [Shellfish Allergy] Diarrhea and Nausea And Vomiting  . Zolpidem Tartrate     disoriented     Physical Exam:   Vitals  Blood pressure 126/81, pulse 120, resp. rate 23, SpO2 94 %.   1. General Frail elderly white male lying in bed in mild to moderate shortness of  breath,  2. Normal affect and insight, Not Suicidal or Homicidal, Awake Alert, Oriented X 3.  3. No F.N deficits, ALL C.Nerves Intact, Strength 5/5 all 4 extremities, Sensation intact all 4 extremities, Plantars down going.  4. Ears and Eyes appear Normal, Conjunctivae clear, PERRLA. Moist Oral Mucosa.  5. Supple Neck, No JVD, No cervical lymphadenopathy appriciated, No Carotid Bruits.  6. Symmetrical Chest wall movement, moderate air movement bilaterally, bilateral wheezing, left-sided chest tube in place  7. RRR, No Gallops, Rubs or Murmurs, No Parasternal Heave.  8. Positive Bowel Sounds, Abdomen Soft, No tenderness, No organomegaly appriciated,No rebound -guarding or rigidity.  9.  No Cyanosis, Normal Skin Turgor, No Skin Rash or Bruise.  10. Good muscle tone,  joints appear normal , no effusions, Normal ROM.  11. No Palpable Lymph Nodes in Neck or Axillae      Data Review:    CBC  Recent Labs Lab 11/20/15 1440  WBC 18.6*  HGB 13.0  HCT 43.2  PLT 305  MCV 101.6*  MCH 30.6  MCHC 30.1  RDW 14.3  LYMPHSABS 6.5*  MONOABS 0.9  EOSABS 0.0  BASOSABS 0.0   ------------------------------------------------------------------------------------------------------------------  Chemistries   Recent Labs Lab 11/20/15 1440  NA 141  K 4.6  CL 101  CO2 28  GLUCOSE 250*  BUN 13  CREATININE 1.01  CALCIUM 9.0  AST 24  ALT 22  ALKPHOS 56  BILITOT 0.6   ------------------------------------------------------------------------------------------------------------------ CrCl cannot be calculated (Unknown ideal weight.). ------------------------------------------------------------------------------------------------------------------ No results for input(s): TSH, T4TOTAL, T3FREE, THYROIDAB in the last 72 hours.  Invalid input(s): FREET3  Coagulation profile No results for input(s): INR, PROTIME in the last 168  hours. ------------------------------------------------------------------------------------------------------------------- No results for input(s): DDIMER in the last 72 hours. -------------------------------------------------------------------------------------------------------------------  Cardiac Enzymes No results for input(s): CKMB, TROPONINI, MYOGLOBIN in the last 168 hours.  Invalid input(s): CK ------------------------------------------------------------------------------------------------------------------ No results found for: BNP   ---------------------------------------------------------------------------------------------------------------  Urinalysis    Component Value Date/Time   COLORURINE YELLOW 08/06/2015 0918   APPEARANCEUR CLOUDY* 08/06/2015 0918   LABSPEC 1.022 08/06/2015 0918   PHURINE 5.5 08/06/2015 0918   GLUCOSEU 250* 08/06/2015 0918   HGBUR MODERATE* 08/06/2015 0918   BILIRUBINUR NEGATIVE 08/06/2015 0918   KETONESUR NEGATIVE 08/06/2015 0918   PROTEINUR 30* 08/06/2015 0918   UROBILINOGEN 0.2 10/30/2012 1429   NITRITE NEGATIVE 08/06/2015 0918   LEUKOCYTESUR NEGATIVE 08/06/2015 0918    ----------------------------------------------------------------------------------------------------------------   Imaging Results:    Dg Chest Portable 1 View  11/20/2015  CLINICAL DATA:  Chest tube insertion. EXAM: PORTABLE CHEST 1 VIEW COMPARISON:  Earlier today FINDINGS: New left chest tube with tip over the mid to upper chest. Left pneumothorax is decreased, now trace. There is now chest wall gas on the left. No mediastinal widening. No pulmonary edema or consolidation. Stable heart size. Emphysema with postsurgical changes on the right. IMPRESSION: Nearly resolved left pneumothorax after chest tube insertion. Electronically Signed   By: Marnee Spring M.D.   On: 11/20/2015 16:24   Dg Chest Portable 1 View  11/20/2015  CLINICAL DATA:  Severe respiratory distress.  EXAM: PORTABLE CHEST 1 VIEW COMPARISON:  08/06/2015 FINDINGS: Advanced emphysema. There is postsurgical changes on the right with chain sutures and scarring along the right diaphragm. Remote right rib fractures versus thoracotomy changes. Left pneumothorax estimated at 20%, greatest at the base where there is trace pleural fluid. No edema or pneumonia. Normal heart size and mediastinal contours. Critical Value/emergent results were called by telephone at the time of interpretation on 11/20/2015 at 3:04 pm to Dr. Marily Memos , who verbally acknowledged these results. IMPRESSION: 1. Left pneumothorax estimated at 20%. 2. Severe emphysema. Electronically Signed   By: Marnee Spring M.D.   On: 11/20/2015 15:04    My personal review of EKG: Rhythm STach, Rate 123/min,  no Acute ST changes   Assessment & Plan:     1. Acute on chronic hypoxic respiratory failure due to COPD exacerbation and spontaneous left-sided pneumothorax. Patient is still quite short of breath, will be admitted to stepdown, pulmonary critical care has been consulted, will be placed on IV steroids and azithromycin, left-sided chest tube has been placed by Cardizem and Lasix surgery, we will monitor closely and stepdown. Nebulizer treatments scheduled and as needed along with oxygen supplementation will continue.  2. Paroxysmal atrial fibrillation. [place] vasc 2 score of at least 5 - continue oral Cardizem, IV Cardizem push as needed, will hold Eliquis and transitioned to IV heparin as he might require surgery. Monitor on telemetry.  3. Mild acute on Chronic diastolic CHF with EF 50% on last echogram. Note patient has been placed on 40 mg of Lasix daily, he usually takes 20 mg daily, he had trace edema and rales. Monitor weight, BMP and drop Lasix dose  once fluid status is better.  4. History of stroke. Continue anticoagulation currently on heparin drip. Did use statin for secondary prevention.  5. Dyslipidemia. Home dose statin  continued.  6. GERD. On PPI.  7. Patient on chronic steroids due to COPD. Continue Fosamax for osteoporosis prevention.  8. Steroid-induced hyperglycemia. Check A1c, sliding scale for now.    DVT Prophylaxis Heparin    AM Labs Ordered, also please review Full Orders  Family Communication: Admission, patients condition and plan of care including tests being ordered have been discussed with the patient and wife-daughter who indicate understanding and agree with the plan and Code Status.  Code Status Full  Likely DC to  Home 4-5 days  Condition GUARDED     Consults called: TCTS, PCCM  Admission status: Stepdown, INPT  Time spent in minutes :35   SINGH,PRASHANT K M.D on 11/20/2015 at 6:27 PM  Between 7am to 7pm - Pager - [phone]. After 7pm go to [email or web address] - password [facility]  [facility] - Office  [phone]

## 2015-11-20 NOTE — Telephone Encounter (Signed)
LVM for pt to return call

## 2015-11-20 NOTE — Progress Notes (Signed)
Patient was placed on bi-pap and immediately removed after CXR showed pneumo. Patient is currently on a continuous neb. RT will continue to monitor.

## 2015-11-20 NOTE — ED Notes (Signed)
Pt to department via EMS- pt reports SOB over the past couple of days but became worse this afternoon. Pt arrived on C-pap given dou-neb and 125mg  solu-medrol. Diminished throughout. 18g R&L AC.

## 2015-11-20 NOTE — ED Notes (Signed)
Dr. Laneta SimmersBartle paged regarding admission orders for patient.

## 2015-11-20 NOTE — ED Provider Notes (Signed)
CSN: [id number]     Arrival date & time 11/20/15  1432 History   First MD Initiated Contact with Patient 11/20/15 1437     Chief Complaint  Patient presents with  . Shortness of Breath     (Consider location/radiation/quality/duration/timing/severity/associated sxs/prior Treatment) Patient is a 75 y.o. male presenting with shortness of breath.  Shortness of Breath Severity:  Severe Onset quality:  Sudden Timing:  Constant Chronicity:  Recurrent Ineffective treatments:  Oxygen and sitting up (bipap) Associated symptoms: chest pain     Past Medical History  Diagnosis Date  . Atrial fibrillation (HCC)   . Hyperlipidemia   . Headache(784.0)   . Osteopenia   . COPD (chronic obstructive pulmonary disease) (HCC)   . Allergic rhinitis   . History of echocardiogram     Echo 5/16:  EF 55-60%, no RWMA, Gr 1 DD  . Incontinence of urine     and stool  . Memory loss   . CVA (cerebral infarction)   . Shortness of breath dyspnea   . GERD (gastroesophageal reflux disease)    Past Surgical History  Procedure Laterality Date  . Catheter ablation    . Cataract extraction, bilateral    . Appendectomy  1958  . Inguinal hernia repair  1975, 1992  . Video assisted thoracoscopy (vats)/thorocotomy  08/17/2012    resection / stapling of blebs, mechanical pleurodesis   . Video assisted thoracoscopy  08/17/2012    Procedure: VIDEO ASSISTED THORACOSCOPY;  Surgeon: [name], MD;  Location: [facility] OR;  Service: Thoracic;  Laterality: Right;  . Resection of apical bleb  08/17/2012    Procedure: RESECTION OF APICAL BLEB;  Surgeon: [name], MD;  Location: [facility] OR;  Service: Thoracic;  Laterality: Right;  stapling of bleb  . Video bronchoscopy  08/27/2012    Procedure: VIDEO BRONCHOSCOPY;  Surgeon: [name], MD;  Location: [facility] OR;  Service: Thoracic;  Laterality: N/A;  evaluation of endobronchial valves   Family History  Problem Relation Age of Onset  . Heart attack Father   . COPD  Father   . Lung cancer Father   . COPD Brother   . COPD Sister   . Stroke Mother   . Hypertension Brother   . Throat cancer Brother    Social History  Substance Use Topics  . Smoking status: Former Smoker -- 3.00 packs/day for 15 years    Types: Cigarettes  . Smokeless tobacco: Former Neurosurgeon    Quit date: 09/01/1965  . Alcohol Use: No    Review of Systems  Unable to perform ROS: Acuity of condition  Respiratory: Positive for shortness of breath.   Cardiovascular: Positive for chest pain.      Allergies  Shrimp and Zolpidem tartrate  Home Medications   Prior to Admission medications   Medication Sig Start Date End Date Taking? Authorizing Provider  acetaminophen (TYLENOL) 325 MG tablet Take 650 mg by mouth every 6 (six) hours as needed for mild pain.    Yes Historical Provider, MD  albuterol (PROAIR HFA) 108 (90 BASE) MCG/ACT inhaler Inhale 2 puffs into the lungs every 4 (four) hours as needed for wheezing or shortness of breath. For shortness of breath/wheezing 07/26/15  Yes Waymon Budge, MD  albuterol (PROVENTIL) (2.5 MG/3ML) 0.083% nebulizer solution USE ONE VIAL VIA NEBULIZER 4 TIMES DAILY Patient taking differently: USE ONE VIAL VIA NEBULIZER 4 TIMES DAILY as needed for wheezing or shortness of breath 07/13/14  Yes Waymon Budge, MD  alendronate (FOSAMAX) 70 MG tablet Take 70 mg by mouth every 7 (seven) days. Takes on Tuesday Take with a full glass of water on an empty stomach.   Yes Historical Provider, MD  alfuzosin (UROXATRAL) 10 MG 24 hr tablet Take 1 tablet by mouth daily. 07/11/15  Yes Historical Provider, MD  apixaban (ELIQUIS) 5 MG TABS tablet Take 1 tablet (5 mg total) by mouth 2 (two) times daily. 01/12/15  Yes Scott Moishe Spice, PA-C  atorvastatin (LIPITOR) 20 MG tablet Take 1 tablet (20 mg total) by mouth daily. 08/08/15  Yes Marquette Saa, MD  Calcium-Magnesium-Vitamin D (ONE-A-DAY CALCIUM PLUS) 500-50-100 MG-MG-UNIT CHEW Chew 1 tablet by mouth 2  (two) times daily.     Yes Historical Provider, MD  EPIPEN 2-PAK 0.3 MG/0.3ML SOAJ injection INJECT 0.3 MLS (0.3 MG TOTAL) INTO THE MUSCLE ONCE. FOR SEVERE SHORTNESS OF BREATH 09/25/15  Yes Waymon Budge, MD  furosemide (LASIX) 20 MG tablet TAKE 1 TABLET (20 MG TOTAL) BY MOUTH EVERY OTHER DAY. 05/14/15  Yes Duke Salvia, MD  guaiFENesin (MUCINEX) 600 MG 12 hr tablet Take 1-2 mg by mouth every 12 (twelve) hours as needed. For cough   Yes Historical Provider, MD  magnesium oxide (MAG-OX) 400 MG tablet Take 1 tablet (400 mg total) by mouth daily. 09/02/11  Yes Duke Salvia, MD  Multiple Vitamins-Minerals (CENTRUM SILVER PO) Take 1 tablet by mouth daily.     Yes Historical Provider, MD  MYRBETRIQ 50 MG TB24 tablet Take 1 tablet by mouth daily. 10/03/15  Yes Historical Provider, MD  Nebulizers (COMPRESSOR/NEBULIZER) MISC Use as directed 06/16/13  Yes Waymon Budge, MD  omeprazole (PRILOSEC) 20 MG capsule Take 1 capsule (20 mg total) by mouth 2 (two) times daily. 11/04/12  Yes Glori Luis, MD  predniSONE (DELTASONE) 20 MG tablet TAKE 1 TABLET (20 MG TOTAL) BY MOUTH DAILY. 06/26/15  Yes Waymon Budge, MD  promethazine-codeine (PHENERGAN WITH CODEINE) 6.25-10 MG/5ML syrup TAKE 5 ML EVERY 6 HOURS AS NEEDED FOR COUGH 07/30/15  Yes Waymon Budge, MD  propafenone (RYTHMOL) 225 MG tablet TAKE ONE TABLET BY MOUTH TWICE A DAY 01/24/15  Yes Scott T Weaver, PA-C  Umeclidinium-Vilanterol (ANORO ELLIPTA) 62.5-25 MCG/INH AEPB Inhale 1 puff into the lungs daily. RINSE MOUTH WELL AFTER USE 11/22/14  Yes Waymon Budge, MD  verapamil (CALAN-SR) 240 MG CR tablet Take 0.5 tablets (120 mg total) by mouth daily. 05/14/15  Yes Duke Salvia, MD   BP 121/63 mmHg  Pulse 83  Temp(Src) 97.7 F (36.5 C) (Oral)  Resp 23  Wt 168 lb 10.4 oz (76.5 kg)  SpO2 96% Physical Exam  Constitutional: He is oriented to person, place, and time. He appears well-developed and well-nourished. He appears distressed.  HENT:  Head:  Normocephalic and atraumatic.  Eyes: Pupils are equal, round, and reactive to light.  Neck: Normal range of motion.  Cardiovascular: Normal rate.   Pulmonary/Chest: Accessory muscle usage present. Tachypnea noted. He is in respiratory distress. He has decreased breath sounds (bilaterally).  Abdominal: Soft. He exhibits no distension. There is no tenderness.  Musculoskeletal: Normal range of motion. He exhibits no edema or tenderness.  Neurological: He is alert and oriented to person, place, and time.  Skin: He is diaphoretic.  Nursing note and vitals reviewed.   ED Course  Procedures (including critical care time)  CRITICAL CARE Performed by: Marily Memos  Total critical care time: 35 minutes Critical care time was exclusive of separately  billable procedures and treating other patients. Critical care was necessary to treat or prevent imminent or life-threatening deterioration. Critical care was time spent personally by me on the following activities: development of treatment plan with patient and/or surrogate as well as nursing, discussions with consultants, evaluation of patient's response to treatment, examination of patient, obtaining history from patient or surrogate, ordering and performing treatments and interventions, ordering and review of laboratory studies, ordering and review of radiographic studies, pulse oximetry and re-evaluation of patient's condition.   Labs Review Labs Reviewed  CBC WITH DIFFERENTIAL/PLATELET - Abnormal; Notable for the following:    WBC 18.6 (*)    MCV 101.6 (*)    Neutro Abs 11.2 (*)    Lymphs Abs 6.5 (*)    All other components within normal limits  COMPREHENSIVE METABOLIC PANEL - Abnormal; Notable for the following:    Glucose, Bld 250 (*)    All other components within normal limits  TROPONIN I - Abnormal; Notable for the following:    Troponin I 0.06 (*)    All other components within normal limits  HEPARIN LEVEL (UNFRACTIONATED) -  Abnormal; Notable for the following:    Heparin Unfractionated >2.20 (*)    All other components within normal limits  APTT - Abnormal; Notable for the following:    aPTT 55 (*)    All other components within normal limits  HEPARIN LEVEL (UNFRACTIONATED) - Abnormal; Notable for the following:    Heparin Unfractionated >2.20 (*)    All other components within normal limits  HEPARIN LEVEL (UNFRACTIONATED) - Abnormal; Notable for the following:    Heparin Unfractionated >2.20 (*)    All other components within normal limits  CBC - Abnormal; Notable for the following:    WBC 15.1 (*)    RBC 4.06 (*)    Hemoglobin 12.3 (*)    All other components within normal limits  BASIC METABOLIC PANEL - Abnormal; Notable for the following:    Chloride 98 (*)    Glucose, Bld 166 (*)    GFR calc non Af Amer 59 (*)    All other components within normal limits  GLUCOSE, CAPILLARY - Abnormal; Notable for the following:    Glucose-Capillary 176 (*)    All other components within normal limits  GLUCOSE, CAPILLARY - Abnormal; Notable for the following:    Glucose-Capillary 137 (*)    All other components within normal limits  MRSA PCR SCREENING  BRAIN NATRIURETIC PEPTIDE  APTT  HEMOGLOBIN A1C  APTT    Imaging Review Dg Chest Port 1 View  11/21/2015  CLINICAL DATA:  New onset of shortness of breath. Patient has a left chest tube. EXAM: PORTABLE CHEST 1 VIEW COMPARISON:  11/20/2015 FINDINGS: Stable position of the left chest tube. No evidence for a left pneumothorax. Left basilar densities are suggestive for atelectasis. Stable blunting and probable scarring at the right costophrenic angle. Again noted are postsurgical changes in the right lung. Heart size is within normal limits and stable. The trachea remains midline. IMPRESSION: Stable position of the left chest tube without a pneumothorax. Left basilar atelectasis. Electronically Signed   By: Richarda OverlieAdam  Henn M.D.   On: 11/21/2015 07:35   Dg Chest Portable  1 View  11/20/2015  CLINICAL DATA:  Chest tube insertion. EXAM: PORTABLE CHEST 1 VIEW COMPARISON:  Earlier today FINDINGS: New left chest tube with tip over the mid to upper chest. Left pneumothorax is decreased, now trace. There is now chest wall gas on the left. No  mediastinal widening. No pulmonary edema or consolidation. Stable heart size. Emphysema with postsurgical changes on the right. IMPRESSION: Nearly resolved left pneumothorax after chest tube insertion. Electronically Signed   By: Marnee Spring M.D.   On: 11/20/2015 16:24   Dg Chest Portable 1 View  11/20/2015  CLINICAL DATA:  Severe respiratory distress. EXAM: PORTABLE CHEST 1 VIEW COMPARISON:  08/06/2015 FINDINGS: Advanced emphysema. There is postsurgical changes on the right with chain sutures and scarring along the right diaphragm. Remote right rib fractures versus thoracotomy changes. Left pneumothorax estimated at 20%, greatest at the base where there is trace pleural fluid. No edema or pneumonia. Normal heart size and mediastinal contours. Critical Value/emergent results were called by telephone at the time of interpretation on 11/20/2015 at 3:04 pm to Dr. Marily Memos , who verbally acknowledged these results. IMPRESSION: 1. Left pneumothorax estimated at 20%. 2. Severe emphysema. Electronically Signed   By: Marnee Spring M.D.   On: 11/20/2015 15:04   I have personally reviewed and evaluated these images and lab results as part of my medical decision-making.   EKG Interpretation   Date/Time:  Tuesday November 20 2015 14:41:13 EDT Ventricular Rate:  127 PR Interval:  123 QRS Duration: 103 QT Interval:  313 QTC Calculation: 455 R Axis:   93 Text Interpretation:  Sinus tachycardia Ventricular premature complex Low  voltage with right axis deviation Artifact in lead(s) I II III aVL aVF V4  V5 V6 Confirmed by Fredrika Canby MD, Barbara Cower [phone]) on 11/20/2015 2:54:46 PM      MDM   Final diagnoses:  SOB (shortness of breath)   Pneuimothorax   75 yo M here with pneumothoax and mild respiratory distress. Initially on BiPaP by EMS however as soon as PTX diagnosed, switched to nonrebreather, still with some albuterol. D/W CT surgery who preferred to put chest tube in himself, which was done with relief of dyspnea but still with significant pain likely 2/2 procedure. Repeat CR with significant improement in PTX. Will admit to medicine stepdown.     Marily Memos, MD 11/21/15 1024

## 2015-11-20 NOTE — Consult Note (Signed)
Name: Mark DakinGrady J Asaro MRN: [id number] DOB: 03/29/1941    ADMISSION DATE:  11/20/2015 CONSULTATION DATE:  11/20/2015  REFERRING MD :  Dr. Thedore MinsSingh  CHIEF COMPLAINT:  SOB  HISTORY OF PRESENT ILLNESS:  75 year old male with PMH as below, which includes COPD (Followed by [facility], on 4L O2 and 20 prednisone), Afib on eliquis, CVA, and history of PTX on R about 3 years ago. He has been feeling "sick" with increasing lethargy for about 8-10 weeks. He went out to run errands with his daughter 3/28, which usually gets him SOB, however, today he was unable to recover with rest and developed L anterior chest pain. He presented to ED where CXR confirmed L sided PTX. Chest tube placed by CVTS. Pain resolved after chest tube placement, but he remained dyspneic and hypoxemic. Felt to be exacerbating his COPD. [facility]   SIGNIFICANT EVENTS  3/28 > admit, CT placed  STUDIES:    PAST MEDICAL HISTORY :   has a past medical history of Atrial fibrillation (HCC); Hyperlipidemia; Headache(784.0); Osteopenia; COPD (chronic obstructive pulmonary disease) (HCC); Allergic rhinitis; History of echocardiogram; Incontinence of urine; Memory loss; CVA (cerebral infarction); Shortness of breath dyspnea; and GERD (gastroesophageal reflux disease).  has past surgical history that includes catheter ablation; Cataract extraction, bilateral; Appendectomy (1958); Inguinal hernia repair (1975, 1992); Video assisted thoracoscopy (vats)/thorocotomy (08/17/2012); Video assisted thoracoscopy (08/17/2012); Resection of apical bleb (08/17/2012); and Video bronchoscopy (08/27/2012). Prior to Admission medications   Medication Sig Start Date End Date Taking? Authorizing Provider  acetaminophen (TYLENOL) 325 MG tablet Take 650 mg by mouth every 6 (six) hours as needed for mild pain.    Yes Historical Provider, MD  albuterol (PROAIR HFA) 108 (90 BASE) MCG/ACT inhaler Inhale 2 puffs into the lungs every 4 (four) hours as  needed for wheezing or shortness of breath. For shortness of breath/wheezing 07/26/15  Yes Waymon Budgelinton D Young, MD  albuterol (PROVENTIL) (2.5 MG/3ML) 0.083% nebulizer solution USE ONE VIAL VIA NEBULIZER 4 TIMES DAILY Patient taking differently: USE ONE VIAL VIA NEBULIZER 4 TIMES DAILY as needed for wheezing or shortness of breath 07/13/14  Yes Waymon Budgelinton D Young, MD  alendronate (FOSAMAX) 70 MG tablet Take 70 mg by mouth every 7 (seven) days. Takes on Tuesday Take with a full glass of water on an empty stomach.   Yes Historical Provider, MD  alfuzosin (UROXATRAL) 10 MG 24 hr tablet Take 1 tablet by mouth daily. 07/11/15  Yes Historical Provider, MD  apixaban (ELIQUIS) 5 MG TABS tablet Take 1 tablet (5 mg total) by mouth 2 (two) times daily. 01/12/15  Yes Scott Moishe Spice Weaver, PA-C  atorvastatin (LIPITOR) 20 MG tablet Take 1 tablet (20 mg total) by mouth daily. 08/08/15  Yes Marquette SaaAbigail Joseph Lancaster, MD  Calcium-Magnesium-Vitamin D (ONE-A-DAY CALCIUM PLUS) 500-50-100 MG-MG-UNIT CHEW Chew 1 tablet by mouth 2 (two) times daily.     Yes Historical Provider, MD  EPIPEN 2-PAK 0.3 MG/0.3ML SOAJ injection INJECT 0.3 MLS (0.3 MG TOTAL) INTO THE MUSCLE ONCE. FOR SEVERE SHORTNESS OF BREATH 09/25/15  Yes Waymon Budgelinton D Young, MD  furosemide (LASIX) 20 MG tablet TAKE 1 TABLET (20 MG TOTAL) BY MOUTH EVERY OTHER DAY. 05/14/15  Yes Duke SalviaSteven C Klein, MD  guaiFENesin (MUCINEX) 600 MG 12 hr tablet Take 1-2 mg by mouth every 12 (twelve) hours as needed. For cough   Yes Historical Provider, MD  magnesium oxide (MAG-OX) 400 MG tablet Take 1 tablet (400 mg total) by mouth daily.  09/02/11  Yes Duke Salvia, MD  Multiple Vitamins-Minerals (CENTRUM SILVER PO) Take 1 tablet by mouth daily.     Yes Historical Provider, MD  MYRBETRIQ 50 MG TB24 tablet Take 1 tablet by mouth daily. 10/03/15  Yes Historical Provider, MD  Nebulizers (COMPRESSOR/NEBULIZER) MISC Use as directed 06/16/13  Yes Waymon Budge, MD  omeprazole (PRILOSEC) 20 MG capsule Take 1  capsule (20 mg total) by mouth 2 (two) times daily. 11/04/12  Yes Glori Luis, MD  predniSONE (DELTASONE) 20 MG tablet TAKE 1 TABLET (20 MG TOTAL) BY MOUTH DAILY. 06/26/15  Yes Waymon Budge, MD  promethazine-codeine (PHENERGAN WITH CODEINE) 6.25-10 MG/5ML syrup TAKE 5 ML EVERY 6 HOURS AS NEEDED FOR COUGH 07/30/15  Yes Waymon Budge, MD  propafenone (RYTHMOL) 225 MG tablet TAKE ONE TABLET BY MOUTH TWICE A DAY 01/24/15  Yes Scott T Weaver, PA-C  Umeclidinium-Vilanterol (ANORO ELLIPTA) 62.5-25 MCG/INH AEPB Inhale 1 puff into the lungs daily. RINSE MOUTH WELL AFTER USE 11/22/14  Yes Waymon Budge, MD  verapamil (CALAN-SR) 240 MG CR tablet Take 0.5 tablets (120 mg total) by mouth daily. 05/14/15  Yes Duke Salvia, MD   Allergies  Allergen Reactions  . Shrimp [Shellfish Allergy] Diarrhea and Nausea And Vomiting  . Zolpidem Tartrate     disoriented    FAMILY HISTORY:  family history includes COPD in his brother, father, and sister; Heart attack in his father; Hypertension in his brother; Lung cancer in his father; Stroke in his mother; Throat cancer in his brother. SOCIAL HISTORY:  reports that he has quit smoking. His smoking use included Cigarettes. He has a 45 pack-year smoking history. He quit smokeless tobacco use about 50 years ago. He reports that he does not drink alcohol or use illicit drugs.  REVIEW OF SYSTEMS:   Bolds are positive  Constitutional: weight loss, gain, night sweats, Fevers, chills, fatigue .  HEENT: headaches, Sore throat, sneezing, nasal congestion, post nasal drip, Difficulty swallowing, Tooth/dental problems, visual complaints visual changes, ear ache CV:  chest pain, radiates: ,Orthopnea, PND, swelling in lower extremities, dizziness, palpitations, syncope.  GI  heartburn, indigestion, abdominal pain, nausea, vomiting, diarrhea, change in bowel habits, loss of appetite, bloody stools.  Resp: cough, productive: , hemoptysis, dyspnea, chest pain, pleuritic.    Skin: rash or itching or icterus GU: dysuria, change in color of urine, urgency or frequency. flank pain, hematuria  MS: joint pain or swelling. decreased range of motion  Psych: change in mood or affect. depression or anxiety.  Neuro: difficulty with speech, weakness, numbness, ataxia    SUBJECTIVE:   VITAL SIGNS: Pulse Rate:  [115-126] 117 (03/28 1815) Resp:  [18-32] 18 (03/28 1815) BP: (124-200)/(73-115) 152/86 mmHg (03/28 1815) SpO2:  [89 %-99 %] 89 % (03/28 1815) FiO2 (%):  [70 %] 70 % (03/28 1435)  PHYSICAL EXAMINATION: General:  Elderly male in mild acute distress Neuro:  Alert, oreinted HEENT:  Price/AT, PERRL, no appreciable JVD Cardiovascular:  RRR, no MRG Lungs:  Coarse R, diminished L. Increased WOB Abdomen:  Soft, non-tender, non-distended Musculoskeletal:  No acute deformity or ROM limitation Skin:  Grossly intact   Recent Labs Lab 11/20/15 1440  NA 141  K 4.6  CL 101  CO2 28  BUN 13  CREATININE 1.01  GLUCOSE 250*    Recent Labs Lab 11/20/15 1440  HGB 13.0  HCT 43.2  WBC 18.6*  PLT 305   Dg Chest Portable 1 View  11/20/2015  CLINICAL DATA:  Chest tube insertion. EXAM: PORTABLE CHEST 1 VIEW COMPARISON:  Earlier today FINDINGS: New left chest tube with tip over the mid to upper chest. Left pneumothorax is decreased, now trace. There is now chest wall gas on the left. No mediastinal widening. No pulmonary edema or consolidation. Stable heart size. Emphysema with postsurgical changes on the right. IMPRESSION: Nearly resolved left pneumothorax after chest tube insertion. Electronically Signed   By: Marnee Spring M.D.   On: 11/20/2015 16:24   Dg Chest Portable 1 View  11/20/2015  CLINICAL DATA:  Severe respiratory distress. EXAM: PORTABLE CHEST 1 VIEW COMPARISON:  08/06/2015 FINDINGS: Advanced emphysema. There is postsurgical changes on the right with chain sutures and scarring along the right diaphragm. Remote right rib fractures versus thoracotomy  changes. Left pneumothorax estimated at 20%, greatest at the base where there is trace pleural fluid. No edema or pneumonia. Normal heart size and mediastinal contours. Critical Value/emergent results were called by telephone at the time of interpretation on 11/20/2015 at 3:04 pm to Dr. Marily Memos , who verbally acknowledged these results. IMPRESSION: 1. Left pneumothorax estimated at 20%. 2. Severe emphysema. Electronically Signed   By: Marnee Spring M.D.   On: 11/20/2015 15:04    ASSESSMENT / PLAN:  Acute hypoxemic respiratory failure secondary to COPD exacerbation - Supplemental O2 to keep sats 88-92% (currently needing 55% FiO2 for this) - Scheduled/PRN bronchodilators (Atrovent, xopenex) - Budesonide - IV steroids as well - ABX per primary, would suggest levofloxacin or doxycycline  L sided pneumothorax - CT to suction 20cmH2O - CXR in AM - Per CVTS  Atrial fibrillation Steroid induced hyperglycemia Chronic CHF - Per primary  Joneen Roach, AGACNP-BC [facility] Pulmonology/Critical Care Pager [phone] or [phone]  11/20/2015 7:10 PM

## 2015-11-20 NOTE — Progress Notes (Signed)
ANTICOAGULATION CONSULT NOTE - Initial Consult  Pharmacy Consult for Heparin  Indication: atrial fibrillation  Vital Signs: BP: 137/79 mmHg (03/28 1900) Pulse Rate: 111 (03/28 1900)  Labs:  Recent Labs  11/20/15 1440 11/20/15 1816  HGB 13.0  --   HCT 43.2  --   PLT 305  --   CREATININE 1.01  --   TROPONINI  --  0.06*    CrCl cannot be calculated (Unknown ideal weight.).   Medical History: Past Medical History  Diagnosis Date  . Atrial fibrillation (HCC)   . Hyperlipidemia   . Headache(784.0)   . Osteopenia   . COPD (chronic obstructive pulmonary disease) (HCC)   . Allergic rhinitis   . History of echocardiogram     Echo 5/16:  EF 55-60%, no RWMA, Gr 1 DD  . Incontinence of urine     and stool  . Memory loss   . CVA (cerebral infarction)   . Shortness of breath dyspnea   . GERD (gastroesophageal reflux disease)     Assessment: 75 yom presenting 3/28 with SOB. PT is on eliquis 5mg  BID PTA for AFib (Last dose 3/28 AM). Pharmacy consulted to dose IV heparin for AFib. PT found to have left-sided PTX and chest tube was placed. Hgb 13, PLT 305, no bleeding noted.   PT states he weights 170lbs   Goal of Therapy:  APTT goal 66-102s Heparin level 0.3-0.7 units/ml Monitor platelets by anticoagulation protocol: Yes   Plan:  BL aPTT/HL  Heparin 3,000 unit bolus Heparin 1,200 units/hr  8hr aPTT/HL Daily aPTT/HL until correlate Monitor for s/s of bleeding  Giulia Hickey C. Marvis MoellerMiles, PharmD Pharmacy Resident  Pager: [phone] 11/20/2015 7:37 PM

## 2015-11-20 NOTE — CV Procedure (Signed)
Chest Tube Insertion Procedure Note  Indications:  Clinically significant Left Pneumothorax with severe oxygen dependent COPD  Pre-operative Diagnosis: Left Pneumothorax  Post-operative Diagnosis: Left Pneumothorax  Procedure Details  Informed consent was obtained for the procedure, including sedation.  Risks of lung perforation, hemorrhage, arrhythmia, and adverse drug reaction were discussed.   After sterile skin prep, using standard technique, a 20 French tube was placed in the left lateral 8th rib space.  Findings: None  Estimated Blood Loss:  Minimal         Specimens:  None              Complications:  None; patient tolerated the procedure well.         Disposition: perfomed in the ER. Admit         Condition: stable  Attending Attestation: I performed the procedure.

## 2015-11-20 NOTE — ED Notes (Signed)
TRH and CritCare paged to [place], RCharity fundraiser

## 2015-11-20 NOTE — ED Notes (Signed)
Chest tube set-up to the bedside.

## 2015-11-21 ENCOUNTER — Inpatient Hospital Stay (HOSPITAL_COMMUNITY): Payer: Medicare Other

## 2015-11-21 DIAGNOSIS — J9601 Acute respiratory failure with hypoxia: Secondary | ICD-10-CM

## 2015-11-21 DIAGNOSIS — J449 Chronic obstructive pulmonary disease, unspecified: Secondary | ICD-10-CM

## 2015-11-21 LAB — CBC
HCT: 40.6 % (ref 39.0–52.0)
Hemoglobin: 12.3 g/dL — ABNORMAL LOW (ref 13.0–17.0)
MCH: 30.3 pg (ref 26.0–34.0)
MCHC: 30.3 g/dL (ref 30.0–36.0)
MCV: 100 fL (ref 78.0–100.0)
Platelets: 262 K/uL (ref 150–400)
RBC: 4.06 MIL/uL — ABNORMAL LOW (ref 4.22–5.81)
RDW: 14.5 % (ref 11.5–15.5)
WBC: 15.1 K/uL — ABNORMAL HIGH (ref 4.0–10.5)

## 2015-11-21 LAB — MRSA PCR SCREENING: MRSA by PCR: NEGATIVE

## 2015-11-21 LAB — BASIC METABOLIC PANEL WITH GFR
Anion gap: 15 (ref 5–15)
BUN: 17 mg/dL (ref 6–20)
CO2: 27 mmol/L (ref 22–32)
Calcium: 9.1 mg/dL (ref 8.9–10.3)
Chloride: 98 mmol/L — ABNORMAL LOW (ref 101–111)
Creatinine, Ser: 1.18 mg/dL (ref 0.61–1.24)
GFR calc Af Amer: >60 mL/min
GFR calc non Af Amer: 59 mL/min — ABNORMAL LOW
Glucose, Bld: 166 mg/dL — ABNORMAL HIGH (ref 65–99)
Potassium: 4.6 mmol/L (ref 3.5–5.1)
Sodium: 140 mmol/L (ref 135–145)

## 2015-11-21 LAB — GLUCOSE, CAPILLARY
GLUCOSE-CAPILLARY: 137 mg/dL — AB (ref 65–99)
GLUCOSE-CAPILLARY: 143 mg/dL — AB (ref 65–99)
GLUCOSE-CAPILLARY: 173 mg/dL — AB (ref 65–99)
GLUCOSE-CAPILLARY: 176 mg/dL — AB (ref 65–99)
Glucose-Capillary: 133 mg/dL — ABNORMAL HIGH (ref 65–99)

## 2015-11-21 LAB — APTT
APTT: 86 s — AB (ref 24–37)
APTT: 104 s — AB (ref 24–37)
aPTT: 55 seconds — ABNORMAL HIGH (ref 24–37)

## 2015-11-21 LAB — HEPARIN LEVEL (UNFRACTIONATED)
Heparin Unfractionated: >2.2 [IU]/mL — ABNORMAL HIGH (ref 0.30–0.70)
Heparin Unfractionated: >2.2 [IU]/mL — ABNORMAL HIGH (ref 0.30–0.70)

## 2015-11-21 LAB — PATHOLOGIST SMEAR REVIEW

## 2015-11-21 LAB — PROCALCITONIN: Procalcitonin: 0.5 ng/mL

## 2015-11-21 MED ORDER — IPRATROPIUM BROMIDE 0.02 % IN SOLN
0.5000 mg | Freq: Four times a day (QID) | RESPIRATORY_TRACT | Status: DC
  Administered 2015-11-21 – 2015-11-25 (×17): 0.5 mg via RESPIRATORY_TRACT
  Filled 2015-11-21 (×18): qty 2.5

## 2015-11-21 MED ORDER — BUDESONIDE 0.25 MG/2ML IN SUSP
0.2500 mg | Freq: Two times a day (BID) | RESPIRATORY_TRACT | Status: DC
  Administered 2015-11-21 – 2015-11-28 (×15): 0.25 mg via RESPIRATORY_TRACT
  Filled 2015-11-21 (×16): qty 2

## 2015-11-21 MED ORDER — LEVALBUTEROL HCL 0.63 MG/3ML IN NEBU
0.6300 mg | INHALATION_SOLUTION | Freq: Four times a day (QID) | RESPIRATORY_TRACT | Status: DC
  Administered 2015-11-21 – 2015-11-25 (×17): 0.63 mg via RESPIRATORY_TRACT
  Filled 2015-11-21 (×20): qty 3

## 2015-11-21 MED ORDER — BUDESONIDE 0.5 MG/2ML IN SUSP
0.2500 mg | Freq: Four times a day (QID) | RESPIRATORY_TRACT | Status: DC
  Filled 2015-11-21 (×2): qty 2

## 2015-11-21 MED ORDER — METHYLPREDNISOLONE SODIUM SUCC 40 MG IJ SOLR
40.0000 mg | Freq: Two times a day (BID) | INTRAMUSCULAR | Status: DC
  Administered 2015-11-21 – 2015-11-25 (×8): 40 mg via INTRAVENOUS
  Filled 2015-11-21 (×9): qty 1

## 2015-11-21 MED ORDER — ALPRAZOLAM 0.5 MG PO TABS
0.5000 mg | ORAL_TABLET | Freq: Once | ORAL | Status: AC
  Administered 2015-11-21: 0.5 mg via ORAL
  Filled 2015-11-21: qty 1

## 2015-11-21 MED ORDER — POLYETHYLENE GLYCOL 3350 17 G PO PACK
17.0000 g | PACK | Freq: Every day | ORAL | Status: DC | PRN
  Administered 2015-11-21 – 2015-11-28 (×2): 17 g via ORAL
  Filled 2015-11-21 (×3): qty 1

## 2015-11-21 NOTE — Consult Note (Addendum)
Name: Mark DakinGrady J Bernardini MRN: [id number] DOB: 06/13/1941    ADMISSION DATE:  11/20/2015 CONSULTATION DATE:  11/20/2015  REFERRING MD :  Dr. Thedore MinsSingh  CHIEF COMPLAINT:  SOB  HISTORY OF PRESENT ILLNESS:  75 year old male with PMH as below, which includes COPD (Followed by [facility], on 4L O2 and 20 prednisone), Afib on eliquis, CVA, and history of PTX on R about 3 years ago. He has been feeling "sick" with increasing lethargy for about 8-10 weeks. He went out to run errands with his daughter 3/28, which usually gets him SOB, however, today he was unable to recover with rest and developed L anterior chest pain. He presented to ED where CXR confirmed L sided PTX. Chest tube placed by CVTS. Pain resolved after chest tube placement, but he remained dyspneic and hypoxemic. Felt to be exacerbating his COPD. [facility], PCCM asked to see.   SIGNIFICANT EVENTS  3/28 > admit, CT placed  STUDIES:   SUBJECTIVE: breathing better, O2 needs also improved  VITAL SIGNS: Temp:  [97.7 F (36.5 C)-98.2 F (36.8 C)] 97.7 F (36.5 C) (03/29 0833) Pulse Rate:  [83-126] 83 (03/29 0900) Resp:  [17-32] 23 (03/29 0900) BP: (119-200)/(63-115) 121/63 mmHg (03/29 0900) SpO2:  [86 %-99 %] 96 % (03/29 0900) FiO2 (%):  [50 %-70 %] 50 % (03/29 0600) Weight:  [76.5 kg (168 lb 10.4 oz)-77.5 kg (170 lb 13.7 oz)] 76.5 kg (168 lb 10.4 oz) (03/29 0500)  PHYSICAL EXAMINATION: General:  Elderly male in noacute distress Neuro:  Alert, oreinted HEENT:  [place]/AT, PERRL, jvd wnl Cardiovascular:  RRR, no MRG Lungs:  Reduced, chest tube on left Abdomen:  Soft, non-tender, non-distended Musculoskeletal:  No acute deformity or ROM limitation Skin:  Grossly intact   Recent Labs Lab 11/20/15 1440 11/21/15 0358  NA 141 140  K 4.6 4.6  CL 101 98*  CO2 28 27  BUN 13 17  CREATININE 1.01 1.18  GLUCOSE 250* 166*    Recent Labs Lab 11/20/15 1440 11/21/15 0358  HGB 13.0 12.3*  HCT 43.2 40.6  WBC 18.6* 15.1*  PLT 305 262   Dg  Chest Port 1 View  11/21/2015  CLINICAL DATA:  New onset of shortness of breath. Patient has a left chest tube. EXAM: PORTABLE CHEST 1 VIEW COMPARISON:  11/20/2015 FINDINGS: Stable position of the left chest tube. No evidence for a left pneumothorax. Left basilar densities are suggestive for atelectasis. Stable blunting and probable scarring at the right costophrenic angle. Again noted are postsurgical changes in the right lung. Heart size is within normal limits and stable. The trachea remains midline. IMPRESSION: Stable position of the left chest tube without a pneumothorax. Left basilar atelectasis. Electronically Signed   By: Richarda OverlieAdam  Henn M.D.   On: 11/21/2015 07:35   Dg Chest Portable 1 View  11/20/2015  CLINICAL DATA:  Chest tube insertion. EXAM: PORTABLE CHEST 1 VIEW COMPARISON:  Earlier today FINDINGS: New left chest tube with tip over the mid to upper chest. Left pneumothorax is decreased, now trace. There is now chest wall gas on the left. No mediastinal widening. No pulmonary edema or consolidation. Stable heart size. Emphysema with postsurgical changes on the right. IMPRESSION: Nearly resolved left pneumothorax after chest tube insertion. Electronically Signed   By: Marnee SpringJonathon  Watts M.D.   On: 11/20/2015 16:24   Dg Chest Portable 1 View  11/20/2015  CLINICAL DATA:  Severe respiratory distress. EXAM: PORTABLE CHEST 1 VIEW COMPARISON:  08/06/2015 FINDINGS: Advanced emphysema. There  is postsurgical changes on the right with chain sutures and scarring along the right diaphragm. Remote right rib fractures versus thoracotomy changes. Left pneumothorax estimated at 20%, greatest at the base where there is trace pleural fluid. No edema or pneumonia. Normal heart size and mediastinal contours. Critical Value/emergent results were called by telephone at the time of interpretation on 11/20/2015 at 3:04 pm to Dr. Marily Memos , who verbally acknowledged these results. IMPRESSION: 1. Left pneumothorax estimated at  20%. 2. Severe emphysema. Electronically Signed   By: Marnee Spring M.D.   On: 11/20/2015 15:04    ASSESSMENT / PLAN:  Acute hypoxemic respiratory failure secondary to COPD exacerbation - Supplemental O2 to keep sats 88-92% (currently needing 55% FiO2 for this) - Scheduled/PRN bronchodilators (Atrovent, xopenex) - Budesonide - IV steroids, reduce 40 q12h -azithro, get pct if neg x 2, dc   L sided pneumothorax - CT to suction 20cmH2O - CXR in AM - Per CVTS management, no leak, no PTX, noncompliant lung, consider water seal  Atrial fibrillation Steroid induced hyperglycemia Chronic CHF -heparin drip to remain with ct in place -rate is controlled -tele  RIsing crt Lasix dep at home -hold lasix Chem in am   To floor consider, tele , sats  Mcarthur Rossetti. Tyson Alias, MD, FACP Pgr: [phone] [facility]

## 2015-11-21 NOTE — Progress Notes (Addendum)
TCTS DAILY ICU PROGRESS NOTE                   [address]            [place] [id number]          [phone]        Total Length of Stay:  LOS: 1 day   Subjective: Feels fair, appears SOB  Objective: Vital signs in last 24 hours: Temp:  [97.7 F (36.5 C)-98.2 F (36.8 C)] 97.7 F (36.5 C) (03/29 0833) Pulse Rate:  [100-126] 108 (03/29 0600) Cardiac Rhythm:  [-]  Resp:  [17-32] 22 (03/29 0600) BP: (119-200)/(69-115) 126/71 mmHg (03/29 0600) SpO2:  [86 %-99 %] 97 % (03/29 0858) FiO2 (%):  [50 %-70 %] 50 % (03/29 0600) Weight:  [168 lb 10.4 oz (76.5 kg)-170 lb 13.7 oz (77.5 kg)] 168 lb 10.4 oz (76.5 kg) (03/29 0500)  Filed Weights   11/20/15 2324 11/21/15 0500  Weight: 170 lb 13.7 oz (77.5 kg) 168 lb 10.4 oz (76.5 kg)    Weight change:    Hemodynamic parameters for last 24 hours:    Intake/Output from previous day: 03/28 0701 - 03/29 0700 In: 82.8 [I.V.:82.8] Out: 845 [Urine:845]  Intake/Output this shift:    Current Meds: Scheduled Meds: . alfuzosin  10 mg Oral Daily  . atorvastatin  20 mg Oral Daily  . azithromycin  500 mg Oral Daily  . budesonide (PULMICORT) nebulizer solution  0.25 mg Nebulization BID  . calcium-vitamin D  1 tablet Oral Q breakfast  . furosemide  40 mg Oral Daily  . insulin aspart  0-5 Units Subcutaneous QHS  . insulin aspart  0-9 Units Subcutaneous TID WC  . ipratropium  0.5 mg Nebulization Q6H  . levalbuterol  0.63 mg Nebulization Q6H  . magnesium oxide  400 mg Oral Daily  . methylPREDNISolone (SOLU-MEDROL) injection  125 mg Intravenous Once  . methylPREDNISolone (SOLU-MEDROL) injection  60 mg Intravenous TID  . mirabegron ER  50 mg Oral Daily  . pantoprazole  40 mg Oral Daily  . propafenone  225 mg Oral BID  . sodium chloride flush  3 mL Intravenous Q12H  . verapamil  120 mg Oral Daily   Continuous Infusions: . heparin 1,500 Units/hr (11/21/15 0618)   PRN Meds:.acetaminophen, albuterol, diltiazem,  guaiFENesin, HYDROcodone-acetaminophen, ondansetron **OR** ondansetron (ZOFRAN) IV, promethazine-codeine  General appearance: alert, cooperative and mild distress Heart: regular rate and rhythm Lungs: fair air exchange, some exp wheeze Abdomen: mod distension, + BS  Lab Results: CBC: Recent Labs  11/20/15 1440 11/21/15 0358  WBC 18.6* 15.1*  HGB 13.0 12.3*  HCT 43.2 40.6  PLT 305 262   BMET:  Recent Labs  11/20/15 1440 11/21/15 0358  NA 141 140  K 4.6 4.6  CL 101 98*  CO2 28 27  GLUCOSE 250* 166*  BUN 13 17  CREATININE 1.01 1.18  CALCIUM 9.0 9.1    PT/INR: No results for input(s): LABPROT, INR in the last 72 hours. Radiology: Dg Chest Port 1 View  11/21/2015  CLINICAL DATA:  New onset of shortness of breath. Patient has a left chest tube. EXAM: PORTABLE CHEST 1 VIEW COMPARISON:  11/20/2015 FINDINGS: Stable position of the left chest tube. No evidence for a left pneumothorax. Left basilar densities are suggestive for atelectasis. Stable blunting and probable scarring at the right costophrenic angle. Again noted are postsurgical changes in the right lung. Heart size is within normal limits and stable.  The trachea remains midline. IMPRESSION: Stable position of the left chest tube without a pneumothorax. Left basilar atelectasis. Electronically Signed   By: Richarda Overlie M.D.   On: 11/21/2015 07:35   Dg Chest Portable 1 View  11/20/2015  CLINICAL DATA:  Chest tube insertion. EXAM: PORTABLE CHEST 1 VIEW COMPARISON:  Earlier today FINDINGS: New left chest tube with tip over the mid to upper chest. Left pneumothorax is decreased, now trace. There is now chest wall gas on the left. No mediastinal widening. No pulmonary edema or consolidation. Stable heart size. Emphysema with postsurgical changes on the right. IMPRESSION: Nearly resolved left pneumothorax after chest tube insertion. Electronically Signed   By: Marnee Spring M.D.   On: 11/20/2015 16:24   Dg Chest Portable 1  View  11/20/2015  CLINICAL DATA:  Severe respiratory distress. EXAM: PORTABLE CHEST 1 VIEW COMPARISON:  08/06/2015 FINDINGS: Advanced emphysema. There is postsurgical changes on the right with chain sutures and scarring along the right diaphragm. Remote right rib fractures versus thoracotomy changes. Left pneumothorax estimated at 20%, greatest at the base where there is trace pleural fluid. No edema or pneumonia. Normal heart size and mediastinal contours. Critical Value/emergent results were called by telephone at the time of interpretation on 11/20/2015 at 3:04 pm to Dr. Marily Memos , who verbally acknowledged these results. IMPRESSION: 1. Left pneumothorax estimated at 20%. 2. Severe emphysema. Electronically Signed   By: Marnee Spring M.D.   On: 11/20/2015 15:04   Chest tube- no current air leak   Assessment/Plan:  1 stable with CT in place on suction. Will leave as is 2 cont aggressive management of severe COPD as per pulmonology GOLD,[name] 11/21/2015 9:23 AM   Chart reviewed, patient examined, agree with above. No air leak present. CXR looks ok.

## 2015-11-21 NOTE — Progress Notes (Signed)
eLink Physician-Brief Progress Note Patient Name: Mark DakinGrady J Jacobsen DOB: 06/12/1941 MRN: [id number]   Date of Service  11/21/2015  HPI/Events of Note  Anxiety  eICU Interventions  Xanax 0.5 mg once     Intervention Category Intermediate Interventions: Other:  Dallan Schonberg 11/21/2015, 10:02 PM

## 2015-11-21 NOTE — Telephone Encounter (Signed)
LMTCB

## 2015-11-21 NOTE — Plan of Care (Signed)
Case discussed with pulmonary critical care attending Dr. Tyson AliasFeinstein, he is assuming care on 11/21/2015. Appreciate his help.

## 2015-11-21 NOTE — Progress Notes (Signed)
ANTICOAGULATION CONSULT NOTE   Pharmacy Consult for Heparin  Indication: atrial fibrillation  Vital Signs: Temp: 98.1 F (36.7 C) (03/29 1826) Temp Source: Oral (03/29 1826) BP: 138/73 mmHg (03/29 1826) Pulse Rate: 102 (03/29 1826)  Labs:  Recent Labs  11/20/15 1440 11/20/15 1816  11/20/15 2014 11/21/15 0358 11/21/15 1325 11/21/15 2020  HGB 13.0  --   --   --  12.3*  --   --   HCT 43.2  --   --   --  40.6  --   --   PLT 305  --   --   --  262  --   --   APTT  --   --   < > 26 55* 86* 104*  HEPARINUNFRC  --   --   --  >2.20* >2.20*  >2.20*  --   --   CREATININE 1.01  --   --   --  1.18  --   --   TROPONINI  --  0.06*  --   --   --   --   --   < > = values in this interval not displayed.  Estimated Creatinine Clearance: 57.4 mL/min (by C-G formula based on Cr of 1.18).  Assessment: 75 yom presented on 3/28 with SOB. PT is on eliquis 5mg  BID PTA for AFib (Last dose 3/28 AM) but has been transitioned to IV heparin. APTT is slightly supra-therapeutic at 104. Will decrease rate and follow up with another ~8 hr level  Goal of Therapy:  APTT goal 66-102s Heparin level 0.3-0.7 units/ml Monitor platelets by anticoagulation protocol: Yes   Plan:  - Decrease heparin gtt to 1400 units/hr - Check an 8 hour aPTT to confirm dosing - Daily aPTT, heparin level and CBC   Thank you for allowing us to participate in this patients care. Signe Coltonya C Jadee Golebiewski, PharmD Pager: [phone] 11/21/2015 8:56 PM

## 2015-11-21 NOTE — Progress Notes (Signed)
ANTICOAGULATION CONSULT NOTE   Pharmacy Consult for Heparin  Indication: atrial fibrillation  Vital Signs: Temp: 97.5 F (36.4 C) (03/29 1126) Temp Source: Oral (03/29 1126) BP: 141/83 mmHg (03/29 1400) Pulse Rate: 116 (03/29 1400)  Labs:  Recent Labs  11/20/15 1440 11/20/15 1816 11/20/15 2014 11/21/15 0358 11/21/15 1325  HGB 13.0  --   --  12.3*  --   HCT 43.2  --   --  40.6  --   PLT 305  --   --  262  --   APTT  --   --  26 55* 86*  HEPARINUNFRC  --   --  >2.20* >2.20*  >2.20*  --   CREATININE 1.01  --   --  1.18  --   TROPONINI  --  0.06*  --   --   --     Estimated Creatinine Clearance: 57.6 mL/min (by C-G formula based on Cr of 1.18).  Assessment: 75 yom presented on 3/28 with SOB. PT is on eliquis 5mg  BID PTA for AFib (Last dose 3/28 AM) but has been transitioned to IV heparin. APTT is now therapeutic at 86. No bleeding noted. CBC is stable.  Goal of Therapy:  APTT goal 66-102s Heparin level 0.3-0.7 units/ml Monitor platelets by anticoagulation protocol: Yes   Plan:  - Continue heparin gtt 1500 units/hr - Check a 6 hour aPTT to confirm dosing - Daily aPTT, heparin level and CBC  Lysle Pearlachel Fergus Throne, PharmD, BCPS Pager # [phone] 11/21/2015 2:50 PM

## 2015-11-21 NOTE — Care Management Note (Addendum)
Case Management Note  Patient Details  Name: Mark [name] MRN: [id number] Date of Birth: 05/01/1941  Subjective/Objective:    Pt admitted with Pnemothorax - requiring CT placement and high levels of oxygen               Action/Plan:  Pt is from home with wife.  Pt uses wheelchair when out in public, no equipment used consistently when in the home - however a cane is available when needed.  Pt is active with [employer] for home O2 on 4 liters.  Pt would benefit from PT/OT eval prior to discharge, will request via physician sticky note.  CM will continue to monitor for disposition needs   Expected Discharge Date:                  Expected Discharge Plan:  Home w Home Health Services  In-House Referral:     Discharge planning Services  CM Consult  Post Acute Care Choice:    Choice offered to:     DME Arranged:    DME Agency:     HH Arranged:    HH Agency:     Status of Service:  In process, will continue to follow  Medicare Important Message Given:    Date Medicare IM Given:    Medicare IM give by:    Date Additional Medicare IM Given:    Additional Medicare Important Message give by:     If discussed at Long Length of Stay Meetings, dates discussed:    Additional Comments:  Cherylann ParrClaxton, Elai Vanwyk S, RN 11/21/2015, 2:59 PM

## 2015-11-21 NOTE — Progress Notes (Signed)
eLink Physician-Brief Progress Note Patient Name: Orlie DakinGrady J Pavlovich DOB: 11/07/1940 MRN: [id number]   Date of Service  11/21/2015  HPI/Events of Note  Patient has chest tube but no chest tube orders.  eICU Interventions  Will order chest tube to -20 cm suction.      Intervention Category Intermediate Interventions: Other:  Lenell AntuSommer,Inice Sanluis Eugene 11/21/2015, 4:48 AM

## 2015-11-21 NOTE — Progress Notes (Signed)
Report called to RN on [facility]. Pt to be transferred via wheelchair with personal belongings. Pts wife at bedside and aware.

## 2015-11-21 NOTE — Progress Notes (Signed)
Pt arrived on [facility] Unit; pt has been oriented to room, surroundings, and any applicable equipment. VSS.  CCMD notified of telemetry. Pt has been educated on the Falls Prevention Plan.  Pt in stable condition. 

## 2015-11-21 NOTE — Progress Notes (Signed)
Patient appears to be anxious and when asked, patient admits that he feels like he is a little anxious. His solumedrol given and paged respiratory to give breathing treatments but it seems that the patient's anxiety is contributing to his difficulty breathing. Patient states he would like something to help him relax when asked by the nurse. Notified on-call PCCM and orders given for xanax. Will administer as ordered and continue to monitor patient to end of shift.

## 2015-11-21 NOTE — Progress Notes (Addendum)
Notified MD Sood in regards to patient's breath sounds on the right side being absent. Patient did have chest tube placed on left side for spontaneous PTX. Will continue to monitor and assess.

## 2015-11-21 NOTE — Progress Notes (Signed)
ANTICOAGULATION CONSULT NOTE - Follow Up Consult  Pharmacy Consult for heparin Indication: atrial fibrillation   Labs:  Recent Labs  11/20/15 1440 11/20/15 1816 11/20/15 2014 11/21/15 0358  HGB 13.0  --   --  12.3*  HCT 43.2  --   --  40.6  PLT 305  --   --  262  APTT  --   --  26 55*  HEPARINUNFRC  --   --  >2.20*  --   CREATININE 1.01  --   --  1.18  TROPONINI  --  0.06*  --   --     Assessment: 75yo male subtherapeutic on heparin with initial dosing while Eliquis on hold.  Goal of Therapy:  aPTT 66-102 seconds   Plan:  Will increase heparin gtt by 4 units/kg/hr to 1500 units/hr and check PTT in 8hr.  Vernard GamblesVeronda Coralynn Gaona, PharmD, BCPS  11/21/2015,6:19 AM

## 2015-11-22 ENCOUNTER — Inpatient Hospital Stay (HOSPITAL_COMMUNITY): Payer: Medicare Other

## 2015-11-22 DIAGNOSIS — J9311 Primary spontaneous pneumothorax: Secondary | ICD-10-CM

## 2015-11-22 DIAGNOSIS — I4891 Unspecified atrial fibrillation: Secondary | ICD-10-CM | POA: Insufficient documentation

## 2015-11-22 DIAGNOSIS — J209 Acute bronchitis, unspecified: Secondary | ICD-10-CM

## 2015-11-22 LAB — BASIC METABOLIC PANEL
Anion gap: 10 (ref 5–15)
BUN: 22 mg/dL — ABNORMAL HIGH (ref 6–20)
CALCIUM: 9 mg/dL (ref 8.9–10.3)
CHLORIDE: 97 mmol/L — AB (ref 101–111)
CO2: 31 mmol/L (ref 22–32)
CREATININE: 0.85 mg/dL (ref 0.61–1.24)
GFR calc Af Amer: >60 mL/min (ref >60)
GFR calc non Af Amer: >60 mL/min (ref >60)
GLUCOSE: 162 mg/dL — AB (ref 65–99)
Potassium: 4.5 mmol/L (ref 3.5–5.1)
Sodium: 138 mmol/L (ref 135–145)

## 2015-11-22 LAB — GLUCOSE, CAPILLARY
GLUCOSE-CAPILLARY: 148 mg/dL — AB (ref 65–99)
GLUCOSE-CAPILLARY: 202 mg/dL — AB (ref 65–99)
GLUCOSE-CAPILLARY: 125 mg/dL — AB (ref 65–99)
Glucose-Capillary: 118 mg/dL — ABNORMAL HIGH (ref 65–99)

## 2015-11-22 LAB — CBC
HCT: 35.8 % — ABNORMAL LOW (ref 39.0–52.0)
HEMOGLOBIN: 11.3 g/dL — AB (ref 13.0–17.0)
MCH: 30.9 pg (ref 26.0–34.0)
MCHC: 31.6 g/dL (ref 30.0–36.0)
MCV: 97.8 fL (ref 78.0–100.0)
PLATELETS: 220 10*3/uL (ref 150–400)
RBC: 3.66 MIL/uL — AB (ref 4.22–5.81)
RDW: 14.6 % (ref 11.5–15.5)
WBC: 17.2 10*3/uL — ABNORMAL HIGH (ref 4.0–10.5)

## 2015-11-22 LAB — MAGNESIUM: Magnesium: 2.6 mg/dL — ABNORMAL HIGH (ref 1.7–2.4)

## 2015-11-22 LAB — APTT
aPTT: 117 seconds — ABNORMAL HIGH (ref 24–37)
aPTT: 92 seconds — ABNORMAL HIGH (ref 24–37)

## 2015-11-22 LAB — HEPARIN LEVEL (UNFRACTIONATED): Heparin Unfractionated: 1.66 IU/mL — ABNORMAL HIGH (ref 0.30–0.70)

## 2015-11-22 LAB — HEMOGLOBIN A1C
Hgb A1c MFr Bld: 6.5 % — ABNORMAL HIGH (ref 4.8–5.6)
Mean Plasma Glucose: 140 mg/dL

## 2015-11-22 LAB — PROCALCITONIN: PROCALCITONIN: 0.36 ng/mL

## 2015-11-22 MED ORDER — DILTIAZEM HCL 60 MG PO TABS
60.0000 mg | ORAL_TABLET | Freq: Three times a day (TID) | ORAL | Status: DC
  Administered 2015-11-22 – 2015-11-23 (×3): 60 mg via ORAL
  Filled 2015-11-22: qty 1
  Filled 2015-11-22: qty 2
  Filled 2015-11-22: qty 1

## 2015-11-22 MED ORDER — LEVALBUTEROL HCL 0.63 MG/3ML IN NEBU
INHALATION_SOLUTION | RESPIRATORY_TRACT | Status: AC
  Filled 2015-11-22: qty 3

## 2015-11-22 MED ORDER — SALINE SPRAY 0.65 % NA SOLN
1.0000 | NASAL | Status: DC | PRN
  Administered 2015-11-22: 1 via NASAL
  Filled 2015-11-22: qty 44

## 2015-11-22 MED ORDER — KETOROLAC TROMETHAMINE 30 MG/ML IJ SOLN
15.0000 mg | Freq: Four times a day (QID) | INTRAMUSCULAR | Status: AC | PRN
  Administered 2015-11-22: 15 mg via INTRAVENOUS
  Filled 2015-11-22 (×2): qty 1

## 2015-11-22 MED ORDER — AZITHROMYCIN 500 MG PO TABS
500.0000 mg | ORAL_TABLET | Freq: Every day | ORAL | Status: DC
  Administered 2015-11-22 – 2015-11-26 (×5): 500 mg via ORAL
  Filled 2015-11-22 (×6): qty 1

## 2015-11-22 NOTE — Progress Notes (Signed)
ANTICOAGULATION CONSULT NOTE - Follow Up Consult  Pharmacy Consult for heparin Indication: atrial fibrillation   Labs:  Recent Labs  11/20/15 1440 11/20/15 1816  11/20/15 2014 11/21/15 0358  11/21/15 2020 11/22/15 0300 11/22/15 1056  HGB 13.0  --   --   --  12.3*  --   --  11.3*  --   HCT 43.2  --   --   --  40.6  --   --  35.8*  --   PLT 305  --   --   --  262  --   --  220  --   APTT  --   --   < > 26 55*  < > 104* 117* 92*  HEPARINUNFRC  --   --   --  >2.20* >2.20*  >2.20*  --   --  1.66*  --   CREATININE 1.01  --   --   --  1.18  --   --  0.85  --   TROPONINI  --  0.06*  --   --   --   --   --   --   --   < > = values in this interval not displayed.  Assessment: 75yo male now therapeutic on heparin with a PTT of 92 seconds   Goal of Therapy:  aPTT 66-102 seconds   Plan:  Continue heparin at 1200 units / hr Follow up AM labs  Thank you Okey RegalLisa Aunesti Pellegrino, PharmD [phone] 11/22/2015,11:34 AM

## 2015-11-22 NOTE — Telephone Encounter (Signed)
lmtcb x3 for pt. 

## 2015-11-22 NOTE — Progress Notes (Signed)
Patient has no output documented for his chest tube since insertion. Upon begining of my shift 55cc of red output is noted to be in the chamber. When the fluid drained is unknown. Will document at the end of shift how much was drained for this 12 hours.

## 2015-11-22 NOTE — Progress Notes (Signed)
Name: Mark [name] MRN: [id number] DOB: 04/06/1941    ADMISSION DATE:  11/20/2015 CONSULTATION DATE:  11/20/2015  REFERRING MD :  Dr. Thedore MinsSingh  CHIEF COMPLAINT:  SOB  HISTORY OF PRESENT ILLNESS:  75 year old male with PMH as below, which includes COPD (Followed by [facility], on 4L O2 and 20 prednisone), Afib on eliquis, CVA, and history of PTX on R about 3 years ago. He has been feeling "sick" with increasing lethargy for about 8-10 weeks. He went out to run errands with his daughter 3/28, which usually gets him SOB, however, today he was unable to recover with rest and developed L anterior chest pain. He presented to ED where CXR confirmed L sided PTX. Chest tube placed by CVTS. Pain resolved after chest tube placement, but he remained dyspneic and hypoxemic. Felt to be exacerbating his COPD. [facility] asked to see.   SIGNIFICANT EVENTS  3/28 > admit, CT placed 3/29> Transfer to floor  STUDIES:   SUBJECTIVE: breathing better, on 4L [place] ( Home Baseline)  VITAL SIGNS: Temp:  [97.3 F (36.3 C)-98.4 F (36.9 C)] 97.7 F (36.5 C) (03/30 1019) Pulse Rate:  [84-116] 108 (03/30 1019) Resp:  [20-31] 20 (03/30 0816) BP: (115-141)/(61-83) 133/61 mmHg (03/30 1019) SpO2:  [88 %-98 %] 95 % (03/30 1019) Weight:  [165 lb 4.8 oz (74.98 kg)-165 lb 11.2 oz (75.161 kg)] 165 lb 11.2 oz (75.161 kg) (03/30 0500)  PHYSICAL EXAMINATION: General:  Elderly male in noacute distress, on 4L [place], L Chest Tube to suction. Neuro:  Alert, oreinted HEENT:  /AT, PERRL, jvd wnl Cardiovascular:  RRR, no MRG Lungs:  Diminished per bases, chest tube on left to suction Abdomen:  Soft, non-tender, non-distended Musculoskeletal:  No acute deformity or ROM limitation Skin:  Grossly intact, bruised, dressing to left chest   Recent Labs Lab 11/20/15 1440 11/21/15 0358 11/22/15 0300  NA 141 140 138  K 4.6 4.6 4.5  CL 101 98* 97*  CO2 28 27 31   BUN 13 17 22*  CREATININE 1.01 1.18 0.85  GLUCOSE 250* 166*  162*    Recent Labs Lab 11/20/15 1440 11/21/15 0358 11/22/15 0300  HGB 13.0 12.3* 11.3*  HCT 43.2 40.6 35.8*  WBC 18.6* 15.1* 17.2*  PLT 305 262 220   Dg Chest Port 1 View  11/21/2015  CLINICAL DATA:  New onset of shortness of breath. Patient has a left chest tube. EXAM: PORTABLE CHEST 1 VIEW COMPARISON:  11/20/2015 FINDINGS: Stable position of the left chest tube. No evidence for a left pneumothorax. Left basilar densities are suggestive for atelectasis. Stable blunting and probable scarring at the right costophrenic angle. Again noted are postsurgical changes in the right lung. Heart size is within normal limits and stable. The trachea remains midline. IMPRESSION: Stable position of the left chest tube without a pneumothorax. Left basilar atelectasis. Electronically Signed   By: Richarda OverlieAdam  Henn M.D.   On: 11/21/2015 07:35   Dg Chest Portable 1 View  11/20/2015  CLINICAL DATA:  Chest tube insertion. EXAM: PORTABLE CHEST 1 VIEW COMPARISON:  Earlier today FINDINGS: New left chest tube with tip over the mid to upper chest. Left pneumothorax is decreased, now trace. There is now chest wall gas on the left. No mediastinal widening. No pulmonary edema or consolidation. Stable heart size. Emphysema with postsurgical changes on the right. IMPRESSION: Nearly resolved left pneumothorax after chest tube insertion. Electronically Signed   By: Marnee SpringJonathon  Watts M.D.   On: 11/20/2015 16:24  Dg Chest Portable 1 View  11/20/2015  CLINICAL DATA:  Severe respiratory distress. EXAM: PORTABLE CHEST 1 VIEW COMPARISON:  08/06/2015 FINDINGS: Advanced emphysema. There is postsurgical changes on the right with chain sutures and scarring along the right diaphragm. Remote right rib fractures versus thoracotomy changes. Left pneumothorax estimated at 20%, greatest at the base where there is trace pleural fluid. No edema or pneumonia. Normal heart size and mediastinal contours. Critical Value/emergent results were called by  telephone at the time of interpretation on 11/20/2015 at 3:04 pm to Dr. Marily Memos , who verbally acknowledged these results. IMPRESSION: 1. Left pneumothorax estimated at 20%. 2. Severe emphysema. Electronically Signed   By: Marnee Spring M.D.   On: 11/20/2015 15:04    ASSESSMENT / PLAN:  Acute hypoxemic respiratory failure secondary to COPD exacerbation - Supplemental O2 to keep sats 88-92% (currently on 4L [place]) - Scheduled/PRN bronchodilators (Atrovent, xopenex) - Budesonide - IV steroids, reduce 40 q12h -azithro, get pct if neg x 2, dc ( pct 3/29: 0.5; 3/30: 0.36) - WBC remains 17.2, continue azithro for now  L sided pneumothorax - CT to suction 20cmH2O - CXR in AM - Per CVTS management, no leak, no PTX, noncompliant lung, consider water seal  Atrial fibrillation Steroid induced hyperglycemia Chronic CHF -heparin drip to remain with ct in place -rate is controlled -tele  Creatinine trending down ( 0.85) Lasix dep at home -hold lasix Chem in am CXR now, if wet resume lasix at lower dose until creatinine at baseline  Will need follow up appointment with Pulmonary Office upon discharge  Floor/ Tele/ SaO2 monitoring/ Consider transitioning CT to water seal as minimal drainage / if CXR indicates continued resolution of pneumo.( Per CVTS) CCM will sign off. Please re-consult if patient requires further pulmonary management prior to D/C.  Bevelyn Ngo, AGACNP-BC [facility] Pulmonary/Critical Care Medicine Pager # [phone] 11/22/15   PCCM Attending Note: Patient seen & examined with nurse practitioner.  Please refer to her progress note that I have reviewed.  Patient continuing to recover from COPD exacerbation with a left-sided pneumothorax. Reports dyspnea and cough improving. Mild chest pain around tube insertion site.  BP 133/61 mmHg  Pulse 108  Temp(Src) 97.7 F (36.5 C) (Oral)  Resp 20  Ht  (1.803 m)  Wt 165 lb 11.2 oz (75.161 kg)  BMI 23.12 kg/m2   SpO2 95%  General:  Laying in bed. No distress. Awake. Integument:  Warm & dry. Bruising on upper extremities. Pulmonary:  Good aeration bilaterally. Normal work of breathing on [place] oxygen.   CBC Latest Ref Rng 11/22/2015 11/21/2015 11/20/2015  WBC 4.0 - 10.5 K/uL 17.2(H) 15.1(H) 18.6(H)  Hemoglobin 13.0 - 17.0 g/dL 11.3(L) 12.3(L) 13.0  Hematocrit 39.0 - 52.0 % 35.8(L) 40.6 43.2  Platelets 150 - 400 K/uL 220 262 305    BMP Latest Ref Rng 11/22/2015 11/21/2015 11/20/2015  Glucose 65 - 99 mg/dL 098(J) 191(Y) 782(N)  BUN 6 - 20 mg/dL 56(O) 17 13  Creatinine 0.61 - 1.24 mg/dL 1.30 8.65 7.84  Sodium 135 - 145 mmol/L 138 140 141  Potassium 3.5 - 5.1 mmol/L 4.5 4.6 4.6  Chloride 101 - 111 mmol/L 97(L) 98(L) 101  CO2 22 - 32 mmol/L Calcium 8.9 - 10.3 mg/dL 9.0 9.1 9.0    A/P:  75 y.o. Male with left pneumothorax & COPD exacerbation. Improving clinically.  1. COPD Exacerbation:  Continuing Budesonide nebs bid. Continue Xopenex & Atrovent nebs q6hr while acutely ill.  Recommend a 2 week taper of Prednisone. 2. Left Pneumothorax:  CVTS managing chest tube. 3. Questionable Infection/Acute Bronchitis:  Completing course of Azithromycin.  PCCM will sign off. Please re-consult if we can be of further assistance.   [name] Christen Jamison Neighbor, M.D. [facility] Pulmonary & Critical Care Pager:  [phone] After 3pm or if no response, call [phone] 12:06 PM 11/22/2015

## 2015-11-22 NOTE — Progress Notes (Signed)
[facility] PROGRESS NOTE  TRYPP HECKMANN ZOX:[id number] DOB: 08/20/1941 DOA: 11/20/2015 PCP: [name], MD  75 year old male with history of COPD on 4 L O2 via nasal cannula, chronic prednisone, A. fib on Eliquis, history of CVA history of spontaneous right-sided pneumothorax 3 years back requiring thoracotomy and chest tube, remote history of smoking, chronic diastolic CHF, stroke, GERD, presented with feeling sick and increasing lethargy for almost 8-10 weeks. He also had productive cough with wheezing and increasing shortness of breath for 1 week presented to the hospital with progressive dyspnea and left-sided chest pain. In the ED he was found to have left-sided spontaneous pneumothorax. Thoracic surgery consulted and a left-sided chest tube placed in. Patient required nonrebreather and was transferred to ICU.  Assessment/Plan: Spontaneous left pneumothorax Status post chest tube placement. Output with no air leak on suction. Plan on transitioning chest tube to waterseal. Pain control with Vicodin, added Toradol when necessary. Thoracic surgery following.  Acute on chronic hypoxic respiratory failure secondary to COPD exacerbation Continue O2 via nasal cannula, IV Solu-Medrol, Pulmicort, when necessary albuterol neb and empiric azithromycin. Supportive care with Tylenol and antitussives.  A. fib with RVR rate controlled with when necessary Cardizem. Add oral Cardizem 30 mg every 6 hours. Continue verapamil and Rythmol. Monitor electrolytes. Holding Eliquis and switched to IV heparin.  History of CVA Continue Lipitor  Chronic diastolic CHF Stable.  GERD  continue PPI  Steroid-induced hyperglycemia Monitor on sliding scale coverage  Mild acute on chronic diastolic CHF EF of 50%. On daily Lasix.  Leukocytosis Possibly stress-induced with acute loss versus chronic steroid use. No signs of acute infection. Monitor  DVT prophylaxis: IV heparin Diet: Heart  healthy   Code Status: Full code Family Communication: Wife at bedside Disposition Plan: Continue inpatient.   Consultants:  PCCM  CTVS  Procedures:  Left  side chest tube  Antibiotics: None   HPI/Subjective: Seen and examining. Denies worsening dyspnea. Complains of soreness on the side of the chest.  Objective: Filed Vitals:   11/22/15 0816 11/22/15 1019  BP: 117/73 133/61  Pulse: 97 108  Temp: 97.3 F (36.3 C) 97.7 F (36.5 C)  Resp: 20     Intake/Output Summary (Last 24 hours) at 11/22/15 1524 Last data filed at 11/22/15 1412  Gross per 24 hour  Intake 700.37 ml  Output    380 ml  Net 320.37 ml   Filed Weights   11/21/15 0500 11/21/15 1821 11/22/15 0500  Weight: 76.5 kg (168 lb 10.4 oz) 74.98 kg (165 lb 4.8 oz) 75.161 kg (165 lb 11.2 oz)    Exam:   General:  Elderly male not in distress  HEENT: No pallor, no JVD, mucosa  Chest: Left-sided chest tube with good aeration, no crackles, rhonchi or wheeze  Cardiovascular: S1 and S2 irregularly irregular, no murmurs or gallop  GI: Soft, nondistended, nontender, bowel sounds present  Musculoskeletal: Warm, no edema  CNS   Data Reviewed: Basic Metabolic Panel:  Recent Labs Lab 11/20/15 1440 11/21/15 0358 11/22/15 0300  NA 141 140 138  K 4.6 4.6 4.5  CL 101 98* 97*  CO2 GLUCOSE 250* 166* 162*  BUN 13 17 22*  CREATININE 1.01 1.18 0.85  CALCIUM 9.0 9.1 9.0   Liver Function Tests:  Recent Labs Lab 11/20/15 1440  AST 24  ALT 22  ALKPHOS 56  BILITOT 0.6  PROT 7.7  ALBUMIN 3.7   No results for input(s): LIPASE, AMYLASE in the last 168 hours.  No results for input(s): AMMONIA in the last 168 hours. CBC:  Recent Labs Lab 11/20/15 1440 11/21/15 0358 11/22/15 0300  WBC 18.6* 15.1* 17.2*  NEUTROABS 11.2*  --   --   HGB 13.0 12.3* 11.3*  HCT 43.2 40.6 35.8*  MCV 101.6* 100.0 97.8  PLT 305 262 220   Cardiac Enzymes:  Recent Labs Lab 11/20/15 1816  TROPONINI 0.06*    BNP (last 3 results)  Recent Labs  11/20/15 1440  BNP 27.3    ProBNP (last 3 results) No results for input(s): PROBNP in the last 8760 hours.  CBG:  Recent Labs Lab 11/21/15 1124 11/21/15 1757 11/21/15 2322 11/22/15 0633 11/22/15 1128  GLUCAP 173* 133* 143* 148* 202*    Recent Results (from the past 240 hour(s))  MRSA PCR Screening     Status: None   Collection Time: 11/21/15 12:05 AM  Result Value Ref Range Status   MRSA by PCR NEGATIVE NEGATIVE Final    Comment:        The GeneXpert MRSA Assay (FDA approved for NASAL specimens only), is one component of a comprehensive MRSA colonization surveillance program. It is not intended to diagnose MRSA infection nor to guide or monitor treatment for MRSA infections.      Studies: Dg Chest Port 1 View  11/22/2015  CLINICAL DATA:  Shortness of breath this morning, improving. Left chest tightness. History of hypertension, a ablation for atrial flutter 5 years ago. History of COPD requiring home O2. EXAM: PORTABLE CHEST 1 VIEW COMPARISON:  Chest x-rays dated 11/21/2015, 11/20/2015 and 05/03/2015. FINDINGS: Left-sided chest tube stable in position. No pneumothorax seen. Persistent streaky opacities at the left lung base, compatible with atelectasis. Stable scarring within the right lung. Additional stable postsurgical changes within the right lung. Heart size is upper normal, stable. Overall cardiomediastinal silhouette is stable in size and configuration. Osseous structures about the chest are unremarkable. IMPRESSION: 1. Stable position of the left-sided chest tube. No pneumothorax seen. 2. Persistent opacity at the left lung base, most suggestive of atelectasis. Stable scarring and postsurgical change within the right lung. No new lung findings. Electronically Signed   By: Bary Richard M.D.   On: 11/22/2015 12:12   Dg Chest Port 1 View  11/21/2015  CLINICAL DATA:  New onset of shortness of breath. Patient has a left chest  tube. EXAM: PORTABLE CHEST 1 VIEW COMPARISON:  11/20/2015 FINDINGS: Stable position of the left chest tube. No evidence for a left pneumothorax. Left basilar densities are suggestive for atelectasis. Stable blunting and probable scarring at the right costophrenic angle. Again noted are postsurgical changes in the right lung. Heart size is within normal limits and stable. The trachea remains midline. IMPRESSION: Stable position of the left chest tube without a pneumothorax. Left basilar atelectasis. Electronically Signed   By: Richarda Overlie M.D.   On: 11/21/2015 07:35   Dg Chest Portable 1 View  11/20/2015  CLINICAL DATA:  Chest tube insertion. EXAM: PORTABLE CHEST 1 VIEW COMPARISON:  Earlier today FINDINGS: New left chest tube with tip over the mid to upper chest. Left pneumothorax is decreased, now trace. There is now chest wall gas on the left. No mediastinal widening. No pulmonary edema or consolidation. Stable heart size. Emphysema with postsurgical changes on the right. IMPRESSION: Nearly resolved left pneumothorax after chest tube insertion. Electronically Signed   By: Marnee Spring M.D.   On: 11/20/2015 16:24    Scheduled Meds: . alfuzosin  10 mg Oral Daily  .  atorvastatin  20 mg Oral Daily  . azithromycin  500 mg Oral Daily  . budesonide (PULMICORT) nebulizer solution  0.25 mg Nebulization BID  . calcium-vitamin D  1 tablet Oral Q breakfast  . insulin aspart  0-5 Units Subcutaneous QHS  . insulin aspart  0-9 Units Subcutaneous TID WC  . ipratropium  0.5 mg Nebulization Q6H  . levalbuterol  0.63 mg Nebulization Q6H  . magnesium oxide  400 mg Oral Daily  . methylPREDNISolone (SOLU-MEDROL) injection  40 mg Intravenous Q12H  . mirabegron ER  50 mg Oral Daily  . pantoprazole  40 mg Oral Daily  . propafenone  225 mg Oral BID  . sodium chloride flush  3 mL Intravenous Q12H  . verapamil  120 mg Oral Daily   Continuous Infusions: . heparin 1,200 Units/hr (11/22/15 0909)      Time spent:  25 minutes    Zahriyah Joo  [facility] Pager [phone] If 7PM-7AM, please contact night-coverage at [email or web address], password [email or web address] 11/22/2015, 3:24 PM  LOS: 2 days

## 2015-11-22 NOTE — Progress Notes (Signed)
ANTICOAGULATION CONSULT NOTE - Follow Up Consult  Pharmacy Consult for heparin Indication: atrial fibrillation   Labs:  Recent Labs  11/20/15 1440 11/20/15 1816  11/20/15 2014 11/21/15 0358 11/21/15 1325 11/21/15 2020 11/22/15 0300  HGB 13.0  --   --   --  12.3*  --   --  11.3*  HCT 43.2  --   --   --  40.6  --   --  35.8*  PLT 305  --   --   --  262  --   --  220  APTT  --   --   < > 26 55* 86* 104* 117*  HEPARINUNFRC  --   --   --  >2.20* >2.20*  >2.20*  --   --   --   CREATININE 1.01  --   --   --  1.18  --   --  0.85  TROPONINI  --  0.06*  --   --   --   --   --   --   < > = values in this interval not displayed.  Assessment: 75yo male remains supratherapeutic on heparin with higher PTT despite decreased rate.  Goal of Therapy:  aPTT 66-102 seconds   Plan:  Will decrease heparin gtt by 2-3 units/kg/hr to 1200 units/hr and check PTT in 6hr.  Vernard GamblesVeronda Montanna Mcbain, PharmD, BCPS  11/22/2015,4:10 AM

## 2015-11-22 NOTE — Plan of Care (Signed)
Problem: Pain Managment: Goal: General experience of comfort will improve Outcome: Progressing Patient verbalizes he is more comfortable with the new pain management measures. Patient also says he is comfortable with his bath.   Problem: Skin Integrity: Goal: Risk for impaired skin integrity will decrease Outcome: Progressing Skin care remains intact. Patient was given barrier cream and lotion for scabs. Patient also given gauze on nasal cannula for ears being sore.

## 2015-11-22 NOTE — Progress Notes (Addendum)
      [address]       [place] [id number]             [phone]      Subjective:  Mr. Mark [name] is without complaints this morning.  He appears short of breath.  Objective: Vital signs in last 24 hours: Temp:  [97.3 F (36.3 C)-98.4 F (36.9 C)] 97.3 F (36.3 C) (03/30 0816) Pulse Rate:  [84-116] 97 (03/30 0816) Cardiac Rhythm:  [-] Normal sinus rhythm;Atrial fibrillation (03/30 0900) Resp:  [20-31] 20 (03/30 0816) BP: (115-141)/(62-83) 117/73 mmHg (03/30 0816) SpO2:  [88 %-98 %] 97 % (03/30 0816) Weight:  [165 lb 4.8 oz (74.98 kg)-165 lb 11.2 oz (75.161 kg)] 165 lb 11.2 oz (75.161 kg) (03/30 0500)  Intake/Output from previous day: 03/29 0701 - 03/30 0700 In: 1020.4 [P.O.:680; I.V.:340.4] Out: 1250 [Urine:1250] Intake/Output this shift: Total I/O In: -  Out: 55 [Chest Tube:55]  General appearance: alert, cooperative and no distress Heart: regular rate and rhythm Lungs: wheezes some bilaterally Wound: clean and dr  Lab Results:  Recent Labs  11/21/15 0358 11/22/15 0300  WBC 15.1* 17.2*  HGB 12.3* 11.3*  HCT 40.6 35.8*  PLT 262 220   BMET:  Recent Labs  11/21/15 0358 11/22/15 0300  NA 140 138  K 4.6 4.5  CL 98* 97*  CO2 27 31  GLUCOSE 166* 162*  BUN 17 22*  CREATININE 1.18 0.85  CALCIUM 9.1 9.0    PT/INR: No results for input(s): LABPROT, INR in the last 72 hours. ABG    Component Value Date/Time   PHART 7.516* 08/18/2012 0828   HCO3 23.1 08/18/2012 0828   TCO2 24 08/18/2012 0828   ACIDBASEDEF 0.8 08/18/2012 0447   O2SAT 99.0 08/18/2012 0828   CBG (last 3)   Recent Labs  11/21/15 1757 11/21/15 2322 11/22/15 0633  GLUCAP 133* 143* 148*    Assessment/Plan:  1. Chest tube- no air leak on suction, minimal output 2. Pulm- severe COPD, CXR with full expansion of lung, no film done today 3. Dispo- will get CXR for today, if lung remains expanded can likely transition chest tube to water seal, care per primary   LOS: 2  days    BARRETT, ERIN 11/22/2015   Chart reviewed, patient examined, agree with above. CXR is ok without ptx. There is no air leak. Will put tube to water seal and plan to remove tomorrow.

## 2015-11-22 NOTE — Progress Notes (Addendum)
Called by primary RN to see pt for decreased sats & increased O2 demands.  BS on Rt very diminished with minimal air movement.  Pt on 55% VM to maintain sats 96%.  Neb tx given and CXR obtained.  Pt states he feels like he is breathing better & remains currently on VM 55%. Will attempt to wean O2 down as pt tolerates.

## 2015-11-23 ENCOUNTER — Inpatient Hospital Stay (HOSPITAL_COMMUNITY): Payer: Medicare Other

## 2015-11-23 ENCOUNTER — Encounter (HOSPITAL_COMMUNITY): Payer: Self-pay | Admitting: Student

## 2015-11-23 LAB — GLUCOSE, CAPILLARY
GLUCOSE-CAPILLARY: 127 mg/dL — AB (ref 65–99)
GLUCOSE-CAPILLARY: 145 mg/dL — AB (ref 65–99)
Glucose-Capillary: 148 mg/dL — ABNORMAL HIGH (ref 65–99)
Glucose-Capillary: 151 mg/dL — ABNORMAL HIGH (ref 65–99)

## 2015-11-23 LAB — APTT: aPTT: 69 seconds — ABNORMAL HIGH (ref 24–37)

## 2015-11-23 LAB — HEPARIN LEVEL (UNFRACTIONATED): HEPARIN UNFRACTIONATED: 0.96 [IU]/mL — AB (ref 0.30–0.70)

## 2015-11-23 LAB — PROCALCITONIN: Procalcitonin: 0.26 ng/mL

## 2015-11-23 LAB — CBC
HEMATOCRIT: 38.8 % — AB (ref 39.0–52.0)
Hemoglobin: 11.9 g/dL — ABNORMAL LOW (ref 13.0–17.0)
MCH: 30.4 pg (ref 26.0–34.0)
MCHC: 30.7 g/dL (ref 30.0–36.0)
MCV: 99.2 fL (ref 78.0–100.0)
Platelets: 235 10*3/uL (ref 150–400)
RBC: 3.91 MIL/uL — ABNORMAL LOW (ref 4.22–5.81)
RDW: 14.6 % (ref 11.5–15.5)
WBC: 16 10*3/uL — AB (ref 4.0–10.5)

## 2015-11-23 MED ORDER — DILTIAZEM HCL 60 MG PO TABS
60.0000 mg | ORAL_TABLET | Freq: Four times a day (QID) | ORAL | Status: DC
  Filled 2015-11-23: qty 1

## 2015-11-23 MED ORDER — DILTIAZEM HCL 60 MG PO TABS
60.0000 mg | ORAL_TABLET | Freq: Four times a day (QID) | ORAL | Status: DC
  Administered 2015-11-23 – 2015-11-28 (×19): 60 mg via ORAL
  Filled 2015-11-23 (×20): qty 1

## 2015-11-23 MED ORDER — ALPRAZOLAM 0.5 MG PO TABS
0.5000 mg | ORAL_TABLET | Freq: Three times a day (TID) | ORAL | Status: DC | PRN
  Administered 2015-11-24 – 2015-11-26 (×4): 0.5 mg via ORAL
  Filled 2015-11-23 (×4): qty 1

## 2015-11-23 NOTE — Care Management Important Message (Signed)
Important Message  Patient Details  Name: Orlie DakinGrady J Trettin MRN: [id number] Date of Birth: 06/11/1941   Medicare Important Message Given:  Yes    Jamaurie Bernier P Pearley Millington 11/23/2015, 12:16 PM

## 2015-11-23 NOTE — Progress Notes (Signed)
[facility] PROGRESS NOTE  SHERYL TOWELL ZOX:[id number] DOB: 03/30/1941 DOA: 11/20/2015 PCP: [name], MD  Brief narrative 75 year old male with history of COPD on 4 L O2 via nasal cannula, chronic prednisone, A. fib on Eliquis, history of CVA history of spontaneous right-sided pneumothorax 3 years back requiring thoracotomy and chest tube, remote history of smoking, chronic diastolic CHF, stroke, GERD, presented with feeling sick and increasing lethargy for almost 8-10 weeks. He also had productive cough with wheezing and increasing shortness of breath for 1 week presented to the hospital with progressive dyspnea and left-sided chest pain. In the ED he was found to have left-sided spontaneous pneumothorax. Thoracic surgery consulted and a left-sided chest tube placed in. Patient required nonrebreather and was transferred to ICU.  Assessment/Plan: Spontaneous left pneumothorax Status post chest tube placement. Output with no air leak on suction. Plan on removing chest tube today. Pain control with Vicodin, added Toradol when necessary. Thoracic surgery following.  Acute on chronic hypoxic respiratory failure secondary to COPD exacerbation Continue O2 via nasal cannula, IV Solu-Medrol, Pulmicort, when necessary albuterol neb and empiric azithromycin. Supportive care with Tylenol and antitussives. he became short of breath last night. Stat chest x-ray was unremarkable. I think it is accompanied of anxiety. Added low-dose Xanax. Wife agrees that patient gets very anxious every now and then. He may need a low dose benzodiazepine upon discharge.  A. fib with RVR . Added oral Cardizem. Continue verapamil and Rythmol. k and mag normal. Holding Eliquis and switched to IV heparin. May switch back to Eliquis tomorrow once chest tube is out and follow chest x-ray in the morning looks okay.  PVCs Increase Cardizem dose to 60 every 6 hours. Normal K and magnesium.  History of  CVA Continue Lipitor  Chronic diastolic CHF Stable.  GERD  continue PPI  Steroid-induced hyperglycemia Monitor on sliding scale coverage  Mild acute on chronic diastolic CHF EF of 50%. On daily Lasix.  Leukocytosis Possibly stress-induced versus chronic steroid use. No signs of acute infection. Monitor  DVT prophylaxis: IV heparin Diet: Heart healthy   Code Status: Full code Family Communication: Wife at bedside Disposition Plan: Continue inpatient monitoring. Order PT.   Consultants:  PCCM  CTVS  Procedures:  Left  sided chest tube placement  Antibiotics: None   HPI/Subjective: Seen and examining. Patient complained of acute shortness of breath overnight. Chest x-ray done was unremarkable.  Objective: Filed Vitals:   11/23/15 0623 11/23/15 0955  BP: 122/61 126/64  Pulse: 67 73  Temp: 97.3 F (36.3 C) 97.5 F (36.4 C)  Resp: 18     Intake/Output Summary (Last 24 hours) at 11/23/15 1234 Last data filed at 11/23/15 1048  Gross per 24 hour  Intake   1869 ml  Output    651 ml  Net   1218 ml   Filed Weights   11/21/15 0500 11/21/15 1821 11/22/15 0500  Weight: 76.5 kg (168 lb 10.4 oz) 74.98 kg (165 lb 4.8 oz) 75.161 kg (165 lb 11.2 oz)    Exam:   General:   not in distress  HEENT:  no JVD, mucosa  Chest: Left-sided chest tube with good aeration, few rhonchi on left side  Cardiovascular: S1 and S2 irregularly irregular, no murmurs or gallop  GI: Soft, nondistended, nontender, bowel sounds present  Musculoskeletal: Warm, no edema   Data Reviewed: Basic Metabolic Panel:  Recent Labs Lab 11/20/15 1440 11/21/15 0358 11/22/15 0300 11/22/15 1627  NA 141 140 138  --  K 4.6 4.6 4.5  --   CL 101 98* 97*  --   CO2 28 27 31   --   GLUCOSE 250* 166* 162*  --   BUN 13 17 22*  --   CREATININE 1.01 1.18 0.85  --   CALCIUM 9.0 9.1 9.0  --   MG  --   --   --  2.6*   Liver Function Tests:  Recent Labs Lab 11/20/15 1440  AST 24  ALT 22   ALKPHOS 56  BILITOT 0.6  PROT 7.7  ALBUMIN 3.7   No results for input(s): LIPASE, AMYLASE in the last 168 hours. No results for input(s): AMMONIA in the last 168 hours. CBC:  Recent Labs Lab 11/20/15 1440 11/21/15 0358 11/22/15 0300 11/23/15 0518  WBC 18.6* 15.1* 17.2* 16.0*  NEUTROABS 11.2*  --   --   --   HGB 13.0 12.3* 11.3* 11.9*  HCT 43.2 40.6 35.8* 38.8*  MCV 101.6* 100.0 97.8 99.2  PLT 305 262 220 235   Cardiac Enzymes:  Recent Labs Lab 11/20/15 1816  TROPONINI 0.06*   BNP (last 3 results)  Recent Labs  11/20/15 1440  BNP 27.3    ProBNP (last 3 results) No results for input(s): PROBNP in the last 8760 hours.  CBG:  Recent Labs Lab 11/22/15 1128 11/22/15 1639 11/22/15 2113 11/23/15 0625 11/23/15 1117  GLUCAP 202* 125* 118* 148* 127*    Recent Results (from the past 240 hour(s))  MRSA PCR Screening     Status: None   Collection Time: 11/21/15 12:05 AM  Result Value Ref Range Status   MRSA by PCR NEGATIVE NEGATIVE Final    Comment:        The GeneXpert MRSA Assay (FDA approved for NASAL specimens only), is one component of a comprehensive MRSA colonization surveillance program. It is not intended to diagnose MRSA infection nor to guide or monitor treatment for MRSA infections.      Studies: Dg Chest Port 1 View  11/23/2015  CLINICAL DATA:  Shortness of breath. EXAM: PORTABLE CHEST 1 VIEW COMPARISON:  11/22/2015. FINDINGS: Mediastinum and hilar structures normal. Left chest tube in stable position. No pneumothorax. Mild left base atelectasis and or infiltrate. Bibasilar pleural thickening again noted consistent with scarring. Surgical sutures noted over the right upper and right lower lung. COPD. Stable cardiomegaly. IMPRESSION: 1.  Left chest tube in stable position.  No pneumothorax. 2. Mild left base atelectasis and or infiltrate . 3. Bilateral pleural parenchymal scarring. Postsurgical changes right lung. COPD. 4. Stable cardiomegaly.   No pulmonary venous congestion. Electronically Signed   By: Maisie Fushomas  Register   On: 11/23/2015 07:43   Dg Chest Port 1 View  11/22/2015  CLINICAL DATA:  75 year old male with shortness of breath EXAM: PORTABLE CHEST 1 VIEW COMPARISON:  Radiograph dated 11/22/2015 FINDINGS: Single-view of chest demonstrates emphysematous changes of the lungs. Left-sided chest tube appears in stable positioning. No pneumothorax identified. Left lung base densities similar to prior study and likely atelectasis in nature. Postsurgical changes of the right lung with scarring. There is blunting of the right costophrenic angle which may postsurgical. A small right pleural effusion is not excluded. The cardiac silhouette is within normal limits with no acute osseous pathology. IMPRESSION: No interval change.  No pneumothorax. Electronically Signed   By: Elgie CollardArash  Radparvar M.D.   On: 11/22/2015 23:23   Dg Chest Port 1 View  11/22/2015  CLINICAL DATA:  Shortness of breath this morning, improving. Left chest tightness.  History of hypertension, a ablation for atrial flutter 5 years ago. History of COPD requiring home O2. EXAM: PORTABLE CHEST 1 VIEW COMPARISON:  Chest x-rays dated 11/21/2015, 11/20/2015 and 05/03/2015. FINDINGS: Left-sided chest tube stable in position. No pneumothorax seen. Persistent streaky opacities at the left lung base, compatible with atelectasis. Stable scarring within the right lung. Additional stable postsurgical changes within the right lung. Heart size is upper normal, stable. Overall cardiomediastinal silhouette is stable in size and configuration. Osseous structures about the chest are unremarkable. IMPRESSION: 1. Stable position of the left-sided chest tube. No pneumothorax seen. 2. Persistent opacity at the left lung base, most suggestive of atelectasis. Stable scarring and postsurgical change within the right lung. No new lung findings. Electronically Signed   By: Bary Richard M.D.   On: 11/22/2015 12:12     Scheduled Meds: . alfuzosin  10 mg Oral Daily  . atorvastatin  20 mg Oral Daily  . azithromycin  500 mg Oral Daily  . budesonide (PULMICORT) nebulizer solution  0.25 mg Nebulization BID  . calcium-vitamin D  1 tablet Oral Q breakfast  . diltiazem  60 mg Oral 3 times per day  . insulin aspart  0-5 Units Subcutaneous QHS  . insulin aspart  0-9 Units Subcutaneous TID WC  . ipratropium  0.5 mg Nebulization Q6H  . levalbuterol  0.63 mg Nebulization Q6H  . magnesium oxide  400 mg Oral Daily  . methylPREDNISolone (SOLU-MEDROL) injection  40 mg Intravenous Q12H  . mirabegron ER  50 mg Oral Daily  . pantoprazole  40 mg Oral Daily  . propafenone  225 mg Oral BID  . sodium chloride flush  3 mL Intravenous Q12H  . verapamil  120 mg Oral Daily   Continuous Infusions: . heparin 1,200 Units/hr (11/23/15 0348)      Time spent: 25 minutes    Jeremaih Klima  [facility] Pager [phone] If 7PM-7AM, please contact night-coverage at [email or web address], password [facility] 11/23/2015, 12:34 PM  LOS: 3 days

## 2015-11-23 NOTE — Progress Notes (Signed)
Pt weaned back down to 4-5L via nasal cannula. Will continue to monitor.

## 2015-11-23 NOTE — Progress Notes (Signed)
ANTICOAGULATION CONSULT NOTE - Follow Up Consult  Pharmacy Consult for heparin Indication: atrial fibrillation   Labs:  Recent Labs  11/20/15 1440 11/20/15 1816  11/21/15 0358  11/22/15 0300 11/22/15 1056 11/23/15 0518  HGB 13.0  --   --  12.3*  --  11.3*  --  11.9*  HCT 43.2  --   --  40.6  --  35.8*  --  38.8*  PLT 305  --   --  262  --  220  --  235  APTT  --   --   < > 55*  < > 117* 92* 69*  HEPARINUNFRC  --   --   < > >2.20*  >2.20*  --  1.66*  --  0.96*  CREATININE 1.01  --   --  1.18  --  0.85  --   --   TROPONINI  --  0.06*  --   --   --   --   --   --   < > = values in this interval not displayed.  Assessment: 75yo male now therapeutic on heparin with a PTT of 69 seconds  Goal of Therapy:  aPTT 66-102 seconds   Plan:  Continue heparin at 1200 units / hr Follow up AM labs  Thank you Okey RegalLisa Abdulahi Schor, PharmD [phone] 11/23/2015,8:52 AM

## 2015-11-23 NOTE — Progress Notes (Signed)
  Subjective:  No complaints  Objective: Vital signs in last 24 hours: Temp:  [97.3 F (36.3 C)-97.7 F (36.5 C)] 97.3 F (36.3 C) (03/31 0623) Pulse Rate:  [63-110] 67 (03/31 0623) Cardiac Rhythm:  [-] Normal sinus rhythm (03/31 0701) Resp:  [18] 18 (03/31 0623) BP: (122-133)/(61-68) 122/61 mmHg (03/31 0623) SpO2:  [70 %-97 %] 93 % (03/31 0623) FiO2 (%):  [55 %] 55 % (03/30 2239)  Hemodynamic parameters for last 24 hours:    Intake/Output from previous day: 03/30 0701 - 03/31 0700 In: 1219.2 [P.O.:960; I.V.:259.2] Out: 405 [Urine:350; Chest Tube:55] Intake/Output this shift:    General appearance: alert and cooperative Heart: regular rate and rhythm, S1, S2 normal, no murmur, click, rub or gallop Lungs: decreased breath sounds throughout no air leak from chest tube  Lab Results:  Recent Labs  11/22/15 0300 11/23/15 0518  WBC 17.2* 16.0*  HGB 11.3* 11.9*  HCT 35.8* 38.8*  PLT 220 235   BMET:  Recent Labs  11/21/15 0358 11/22/15 0300  NA 140 138  K 4.6 4.5  CL 98* 97*  CO2 27 31  GLUCOSE 166* 162*  BUN 17 22*  CREATININE 1.18 0.85  CALCIUM 9.1 9.0    PT/INR: No results for input(s): LABPROT, INR in the last 72 hours. ABG    Component Value Date/Time   PHART 7.516* 08/18/2012 0828   HCO3 23.1 08/18/2012 0828   TCO2 24 08/18/2012 0828   ACIDBASEDEF 0.8 08/18/2012 0447   O2SAT 99.0 08/18/2012 0828   CBG (last 3)   Recent Labs  11/22/15 1639 11/22/15 2113 11/23/15 0625  GLUCAP 125* 118* 148*    Assessment/Plan:  CXR  Shows no ptx and there is no air leak. Chest tube can be removed today and will check CXR tomorrow.   LOS: 3 days    Alleen BorneBryan K Laysha Childers 11/23/2015

## 2015-11-24 ENCOUNTER — Inpatient Hospital Stay (HOSPITAL_COMMUNITY): Payer: Medicare Other

## 2015-11-24 DIAGNOSIS — E785 Hyperlipidemia, unspecified: Secondary | ICD-10-CM

## 2015-11-24 DIAGNOSIS — I4891 Unspecified atrial fibrillation: Secondary | ICD-10-CM

## 2015-11-24 DIAGNOSIS — Z9889 Other specified postprocedural states: Secondary | ICD-10-CM

## 2015-11-24 DIAGNOSIS — I10 Essential (primary) hypertension: Secondary | ICD-10-CM

## 2015-11-24 DIAGNOSIS — Z8673 Personal history of transient ischemic attack (TIA), and cerebral infarction without residual deficits: Secondary | ICD-10-CM

## 2015-11-24 DIAGNOSIS — J939 Pneumothorax, unspecified: Secondary | ICD-10-CM

## 2015-11-24 LAB — GLUCOSE, CAPILLARY
Glucose-Capillary: 141 mg/dL — ABNORMAL HIGH (ref 65–99)
Glucose-Capillary: 159 mg/dL — ABNORMAL HIGH (ref 65–99)
Glucose-Capillary: 140 mg/dL — ABNORMAL HIGH (ref 65–99)
Glucose-Capillary: 95 mg/dL (ref 65–99)

## 2015-11-24 LAB — CBC
HCT: 37.2 % — ABNORMAL LOW (ref 39.0–52.0)
Hemoglobin: 11.3 g/dL — ABNORMAL LOW (ref 13.0–17.0)
MCH: 29.9 pg (ref 26.0–34.0)
MCHC: 30.4 g/dL (ref 30.0–36.0)
MCV: 98.4 fL (ref 78.0–100.0)
Platelets: 227 10*3/uL (ref 150–400)
RBC: 3.78 MIL/uL — ABNORMAL LOW (ref 4.22–5.81)
RDW: 14.5 % (ref 11.5–15.5)
WBC: 10.3 10*3/uL (ref 4.0–10.5)

## 2015-11-24 LAB — APTT: APTT: 62 s — AB (ref 24–37)

## 2015-11-24 LAB — HEPARIN LEVEL (UNFRACTIONATED): Heparin Unfractionated: 0.66 IU/mL (ref 0.30–0.70)

## 2015-11-24 MED ORDER — GUAIFENESIN-DM 100-10 MG/5ML PO SYRP
5.0000 mL | ORAL_SOLUTION | ORAL | Status: DC | PRN
  Administered 2015-11-24 – 2015-11-27 (×6): 5 mL via ORAL
  Filled 2015-11-24 (×7): qty 5

## 2015-11-24 NOTE — Progress Notes (Signed)
ANTICOAGULATION CONSULT NOTE - Follow Up Consult  Pharmacy Consult for heparin Indication: atrial fibrillation   Labs:  Recent Labs  11/22/15 0300 11/22/15 1056 11/23/15 0518 11/24/15 0334  HGB 11.3*  --  11.9* 11.3*  HCT 35.8*  --  38.8* 37.2*  PLT 220  --  235 227  APTT 117* 92* 69* 62*  HEPARINUNFRC 1.66*  --  0.96* 0.66  CREATININE 0.85  --   --   --     Assessment: 75yo male w/ afib now therapeutic on heparin. CBC stable.  Goal of Therapy:  HL 0.3-0.7   Plan:  Continue heparin at 1200 units / hr Follow up daily HL, CBC, s/sx bleeding  Mark [name], PharmD Clinical Pharmacy Resident Pager # [phone] 11/24/2015 3:30 PM

## 2015-11-24 NOTE — Evaluation (Signed)
Physical Therapy Evaluation Patient Details Name: Mark [name] MRN: [id number] DOB: November 27, 1940 Today's Date: 11/24/2015   History of Present Illness  75 year old male with history of COPD on 4 L O2 via nasal cannula, chronic prednisone, A. fib on Eliquis, history of CVA history of spontaneous right-sided pneumothorax 3 years back requiring thoracotomy and chest tube, remote history of smoking, chronic diastolic CHF, stroke, GERD, presented with feeling sick and increasing lethargy for almost 8-10 weeks. He also had productive cough with wheezing and increasing shortness of breath for 1 week presented to the hospital with progressive dyspnea and left-sided chest pain.  Clinical Impression  Patient presents with dependencies in transfers and gait due to cardiopulmonary status and generalized weakness.  Patient will benefit from continued PT to progress mobility and increase independence during mobility for return home. Recommend HHPT at discharge to continue to progress patient.  Patient has all equipment needs.     Follow Up Recommendations Home health PT    Equipment Recommendations  None recommended by PT    Recommendations for Other Services       Precautions / Restrictions Precautions Precautions: Fall      Mobility  Bed Mobility Overal bed mobility: Independent                Transfers Overall transfer level: Needs assistance Equipment used: Rolling walker (2 wheeled) Transfers: Sit to/from [employer] Sit to Stand: Min assist Stand pivot transfers: Min assist       General transfer comment: assist to power and guarding for safety; O2 sats dropping so did not ambulate, only bed to chair.  Ambulation/Gait                Stairs            Wheelchair Mobility    Modified Rankin (Stroke Patients Only)       Balance Overall balance assessment: Needs assistance Sitting-balance support: No upper extremity supported Sitting  balance-Leahy Scale: Fair     Standing balance support: Bilateral upper extremity supported Standing balance-Leahy Scale: Poor Standing balance comment: reliant on RW for balance                             Pertinent Vitals/Pain Pain Assessment: No/denies pain    Home Living Family/patient expects to be discharged to:: Private residence Living Arrangements: Spouse/significant other Available Help at Discharge: Available 24 hours/day;Family Type of Home: House Home Access: Stairs to enter Entrance Stairs-Rails: None Entrance Stairs-Number of Steps: 1 Home Layout: One level Home Equipment: Shower seat;Wheelchair - power;Wheelchair - manual;Grab bars - tub/shower;Walker - 2 wheels;Cane - single point Additional Comments: Per patient, able to walk about 50 feet before getting SOB; no longer takes showers as he gets too short of breath.    Prior Function Level of Independence: Independent         Comments: reports he occasionally uses cane or RW in house     Hand Dominance        Extremity/Trunk Assessment   Upper Extremity Assessment: Generalized weakness           Lower Extremity Assessment: Generalized weakness      Cervical / Trunk Assessment: Normal  Communication   Communication: No difficulties  Cognition Arousal/Alertness: Awake/alert Behavior During Therapy: WFL for tasks assessed/performed Overall Cognitive Status: Within Functional Limits for tasks assessed  General Comments      Exercises        Assessment/Plan    PT Assessment Patient needs continued PT services  PT Diagnosis Generalized weakness   PT Problem List Decreased strength;Decreased activity tolerance;Decreased balance;Decreased mobility;Cardiopulmonary status limiting activity  PT Treatment Interventions DME instruction;Gait training;Functional mobility training;Therapeutic activities;Therapeutic exercise;Balance training;Patient/family  education   PT Goals (Current goals can be found in the Care Plan section) Acute Rehab PT Goals Patient Stated Goal: breath better PT Goal Formulation: With patient Time For Goal Achievement: 12/01/15 Potential to Achieve Goals: Fair    Frequency Min 3X/week   Barriers to discharge        Co-evaluation               End of Session Equipment Utilized During Treatment: Oxygen Activity Tolerance: Patient limited by fatigue;Treatment limited secondary to medical complications (Comment) (O2 sats dropped) Patient left: in chair;with call bell/phone within reach Nurse Communication: Mobility status         Time: [id number] PT Time Calculation (min) (ACUTE ONLY): 20 min   Charges:   PT Evaluation $PT Eval Moderate Complexity: 1 Procedure     PT G CodesOlivia Canter:        [name] 11/24/2015, 3:40 PM  11/24/2015 [name], PT [phone]

## 2015-11-24 NOTE — Progress Notes (Signed)
Progress Note   Mark [name]:[id number] DOB: Apr 13, 1941 DOA: 11/20/2015 PCP: [name], MD   Brief Narrative:   Mark [name] is an 75 y.o. male with history of COPD on 4 L O2 via nasal cannula, chronic prednisone, A. fib on Eliquis, history of CVA history of spontaneous right-sided pneumothorax 3 years back requiring thoracotomy and chest tube, remote history of smoking, chronic diastolic CHF, stroke, GERD, presented with feeling sick and increasing lethargy for almost 8-10 weeks. He also had productive cough with wheezing and increasing shortness of breath for 1 week presented to the hospital with progressive dyspnea and left-sided chest pain. In the ED he was found to have left-sided spontaneous pneumothorax. Status post chest tube placement on admission, removed 11/24/15.  Assessment/Plan:   Spontaneous left pneumothorax Status post chest tube placement. Chest tube removed 11/24/15. Follow-up chest x-ray pending. Pain control with Vicodin, and Toradol when necessary.  Acute on chronic hypoxic respiratory failure secondary to COPD exacerbation Continue O2 via nasal cannula, IV Solu-Medrol, Pulmicort, when necessary albuterol neb and empiric azithromycin. Supportive care with Tylenol and antitussives. Became short of breath overnight Dec 05, 2015 with follow-up chest x-ray unremarkable. Xanax added for anxiety.  A. fib with RVR Cardizem added. Continue verapamil and Rythmol. Holding Eliquis and switched to IV heparin. May switch back to Eliquis chest x-ray clear and no further need for chest tube present.  PVCs Continue Cardizem, dose increased 12/05/2015 to 60 every 6 hours. Normal K and magnesium.  History of CVA Continue Lipitor  Chronic diastolic CHF Stable.  GERD Continue PPI.  Steroid-induced hyperglycemia CBGs 127-151. Continue insulin sensitive SSI.  Mild acute on chronic diastolic CHF EF of 50%. On daily Lasix.  Leukocytosis Possibly stress-induced versus  chronic steroid use. No signs of acute infection. Monitor  Hypermagnesemia Discontinue magnesium supplement.  DVT prophylaxis  IV heparin.   Family Communication/Anticipated D/C date and plan/Code Status   Family Communication: Wife updated at the bedside. Disposition Plan/date: From home with wife. PT evaluation requested. Code Status: Full code.   Procedures and diagnostic studies:   Dg Chest Port 1 View  2015/12/05  CLINICAL DATA:  Shortness of breath. EXAM: PORTABLE CHEST 1 VIEW COMPARISON:  11/22/2015. FINDINGS: Mediastinum and hilar structures normal. Left chest tube in stable position. No pneumothorax. Mild left base atelectasis and or infiltrate. Bibasilar pleural thickening again noted consistent with scarring. Surgical sutures noted over the right upper and right lower lung. COPD. Stable cardiomegaly. IMPRESSION: 1.  Left chest tube in stable position.  No pneumothorax. 2. Mild left base atelectasis and or infiltrate . 3. Bilateral pleural parenchymal scarring. Postsurgical changes right lung. COPD. 4. Stable cardiomegaly.  No pulmonary venous congestion. Electronically Signed   By: Maisie Fus  Register   On: December 05, 2015 07:43   Dg Chest Port 1 View  11/22/2015  CLINICAL DATA:  75 year old male with shortness of breath EXAM: PORTABLE CHEST 1 VIEW COMPARISON:  Radiograph dated 11/22/2015 FINDINGS: Single-view of chest demonstrates emphysematous changes of the lungs. Left-sided chest tube appears in stable positioning. No pneumothorax identified. Left lung base densities similar to prior study and likely atelectasis in nature. Postsurgical changes of the right lung with scarring. There is blunting of the right costophrenic angle which may postsurgical. A small right pleural effusion is not excluded. The cardiac silhouette is within normal limits with no acute osseous pathology. IMPRESSION: No interval change.  No pneumothorax. Electronically Signed   By: Elgie Collard M.D.   On:  11/22/2015 23:23  Dg Chest Port 1 View  11/22/2015  CLINICAL DATA:  Shortness of breath this morning, improving. Left chest tightness. History of hypertension, a ablation for atrial flutter 5 years ago. History of COPD requiring home O2. EXAM: PORTABLE CHEST 1 VIEW COMPARISON:  Chest x-rays dated 11/21/2015, 11/20/2015 and 05/03/2015. FINDINGS: Left-sided chest tube stable in position. No pneumothorax seen. Persistent streaky opacities at the left lung base, compatible with atelectasis. Stable scarring within the right lung. Additional stable postsurgical changes within the right lung. Heart size is upper normal, stable. Overall cardiomediastinal silhouette is stable in size and configuration. Osseous structures about the chest are unremarkable. IMPRESSION: 1. Stable position of the left-sided chest tube. No pneumothorax seen. 2. Persistent opacity at the left lung base, most suggestive of atelectasis. Stable scarring and postsurgical change within the right lung. No new lung findings. Electronically Signed   By: Bary Richard M.D.   On: 11/22/2015 12:12   Dg Chest Port 1 View  11/21/2015  CLINICAL DATA:  New onset of shortness of breath. Patient has a left chest tube. EXAM: PORTABLE CHEST 1 VIEW COMPARISON:  11/20/2015 FINDINGS: Stable position of the left chest tube. No evidence for a left pneumothorax. Left basilar densities are suggestive for atelectasis. Stable blunting and probable scarring at the right costophrenic angle. Again noted are postsurgical changes in the right lung. Heart size is within normal limits and stable. The trachea remains midline. IMPRESSION: Stable position of the left chest tube without a pneumothorax. Left basilar atelectasis. Electronically Signed   By: Richarda Overlie M.D.   On: 11/21/2015 07:35   Dg Chest Portable 1 View  11/20/2015  CLINICAL DATA:  Chest tube insertion. EXAM: PORTABLE CHEST 1 VIEW COMPARISON:  Earlier today FINDINGS: New left chest tube with tip over the mid  to upper chest. Left pneumothorax is decreased, now trace. There is now chest wall gas on the left. No mediastinal widening. No pulmonary edema or consolidation. Stable heart size. Emphysema with postsurgical changes on the right. IMPRESSION: Nearly resolved left pneumothorax after chest tube insertion. Electronically Signed   By: Marnee Spring M.D.   On: 11/20/2015 16:24   Dg Chest Portable 1 View  11/20/2015  CLINICAL DATA:  Severe respiratory distress. EXAM: PORTABLE CHEST 1 VIEW COMPARISON:  08/06/2015 FINDINGS: Advanced emphysema. There is postsurgical changes on the right with chain sutures and scarring along the right diaphragm. Remote right rib fractures versus thoracotomy changes. Left pneumothorax estimated at 20%, greatest at the base where there is trace pleural fluid. No edema or pneumonia. Normal heart size and mediastinal contours. Critical Value/emergent results were called by telephone at the time of interpretation on 11/20/2015 at 3:04 pm to Dr. Marily Memos , who verbally acknowledged these results. IMPRESSION: 1. Left pneumothorax estimated at 20%. 2. Severe emphysema. Electronically Signed   By: Marnee Spring M.D.   On: 11/20/2015 15:04    Medical Consultants:    CVTS  Anti-Infectives:   Azithromycin 11/20/15--->   Subjective:    HUXLEY SHURLEY feels better, less dyspneic but still has a cough productive of yellow sputum. Appetite okay. Bowels are moving. Has had some chills but no frank fever.  Objective:    Filed Vitals:   11/24/15 0049 11/24/15 0325 11/24/15 0647 11/24/15 0704  BP: 112/64  120/60   Pulse: 81  92   Temp: 97.8 F (36.6 C)     TempSrc: Oral     Resp: 20     Height:  Weight:    75.932 kg (167 lb 6.4 oz)  SpO2: 94% 94%      Intake/Output Summary (Last 24 hours) at 11/24/15 0851 Last data filed at 11/24/15 0848  Gross per 24 hour  Intake 2354.4 ml  Output   1126 ml  Net 1228.4 ml   Filed Weights   11/21/15 1821 11/22/15 0500  11/24/15 0704  Weight: 74.98 kg (165 lb 4.8 oz) 75.161 kg (165 lb 11.2 oz) 75.932 kg (167 lb 6.4 oz)    Exam: Gen:  NAD Cardiovascular:  RRR, No M/R/G Respiratory:  Lungs CTAB Gastrointestinal:  Abdomen soft, NT/ND, + BS Extremities:  No C/E/C   Data Reviewed:    Labs: Basic Metabolic Panel:  Recent Labs Lab 11/20/15 1440 11/21/15 0358 11/22/15 0300 11/22/15 1627  NA 141 140 138  --   K 4.6 4.6 4.5  --   CL 101 98* 97*  --   CO2 --   GLUCOSE 250* 166* 162*  --   BUN 13 17 22*  --   CREATININE 1.01 1.18 0.85  --   CALCIUM 9.0 9.1 9.0  --   MG  --   --   --  2.6*   GFR Estimated Creatinine Clearance: 80 mL/min (by C-G formula based on Cr of 0.85). Liver Function Tests:  Recent Labs Lab 11/20/15 1440  AST 24  ALT 22  ALKPHOS 56  BILITOT 0.6  PROT 7.7  ALBUMIN 3.7   CBC:  Recent Labs Lab 11/20/15 1440 11/21/15 0358 11/22/15 0300 11/23/15 0518 11/24/15 0334  WBC 18.6* 15.1* 17.2* 16.0* 10.3  NEUTROABS 11.2*  --   --   --   --   HGB 13.0 12.3* 11.3* 11.9* 11.3*  HCT 43.2 40.6 35.8* 38.8* 37.2*  MCV 101.6* 100.0 97.8 99.2 98.4  PLT 305 262 220 235 227   Cardiac Enzymes:  Recent Labs Lab 11/20/15 1816  TROPONINI 0.06*   CBG:  Recent Labs Lab 11/23/15 0625 11/23/15 1117 11/23/15 1720 11/23/15 2313 11/24/15 0642  GLUCAP 148* 127* 145* 151* 141*   Sepsis Labs:  Recent Labs Lab 11/21/15 0358 11/21/15 1325 11/22/15 0300 11/23/15 0518 11/24/15 0334  PROCALCITON  --  0.50 0.36 0.26  --   WBC 15.1*  --  17.2* 16.0* 10.3   Microbiology Recent Results (from the past 240 hour(s))  MRSA PCR Screening     Status: None   Collection Time: 11/21/15 12:05 AM  Result Value Ref Range Status   MRSA by PCR NEGATIVE NEGATIVE Final    Comment:        The GeneXpert MRSA Assay (FDA approved for NASAL specimens only), is one component of a comprehensive MRSA colonization surveillance program. It is not intended to diagnose  MRSA infection nor to guide or monitor treatment for MRSA infections.      Medications:   . alfuzosin  10 mg Oral Daily  . atorvastatin  20 mg Oral Daily  . azithromycin  500 mg Oral Daily  . budesonide (PULMICORT) nebulizer solution  0.25 mg Nebulization BID  . calcium-vitamin D  1 tablet Oral Q breakfast  . diltiazem  60 mg Oral 4 times per day  . insulin aspart  0-5 Units Subcutaneous QHS  . insulin aspart  0-9 Units Subcutaneous TID WC  . ipratropium  0.5 mg Nebulization Q6H  . levalbuterol  0.63 mg Nebulization Q6H  . magnesium oxide  400 mg Oral Daily  . methylPREDNISolone (SOLU-MEDROL) injection  40  mg Intravenous Q12H  . mirabegron ER  50 mg Oral Daily  . pantoprazole  40 mg Oral Daily  . propafenone  225 mg Oral BID  . sodium chloride flush  3 mL Intravenous Q12H   Continuous Infusions: . heparin 1,200 Units/hr (11/23/15 1505)    Time spent: 25 minutes.   LOS: 4 days   RAMA,CHRISTINA  [facility] Pager [phone]. If unable to reach me by pager, please call my cell phone at [phone].  *Please refer to [email or web address], password [email or web address] to get updated schedule on who will round on this patient, as hospitalists switch teams weekly. If 7PM-7AM, please contact night-coverage at [email or web address], password [email or web address] for any overnight needs.  11/24/2015, 8:51 AM

## 2015-11-24 NOTE — Progress Notes (Addendum)
      [address]       [place] [id number]             [phone]      Subjective:  [name] states he doesn't feel well today.  He has no specific complaints.  His chest tube was removed this morning.  Objective: Vital signs in last 24 hours: Temp:  [97.3 F (36.3 C)-97.8 F (36.6 C)] 97.8 F (36.6 C) (04/01 0049) Pulse Rate:  [75-92] 92 (04/01 0647) Cardiac Rhythm:  [-] Normal sinus rhythm (04/01 0901) Resp:  [20-21] 20 (04/01 0049) BP: (112-140)/(59-70) 120/60 mmHg (04/01 0647) SpO2:  [94 %] 94 % (04/01 0910) Weight:  [167 lb 6.4 oz (75.932 kg)] 167 lb 6.4 oz (75.932 kg) (04/01 0704)  Intake/Output from previous day: 03/31 0701 - 04/01 0700 In: 1994.4 [P.O.:1680; I.V.:314.4] Out: 826 [Urine:825; Stool:1] Intake/Output this shift: Total I/O In: 360 [P.O.:360] Out: 300 [Urine:300]  General appearance: alert, cooperative and no distress Heart: regular rate and rhythm Lungs: diminished breath sounds bilaterally Wound: clean and dry  Lab Results:  Recent Labs  11/23/15 0518 11/24/15 0334  WBC 16.0* 10.3  HGB 11.9* 11.3*  HCT 38.8* 37.2*  PLT 235 227   BMET:  Recent Labs  11/22/15 0300  NA 138  K 4.5  CL 97*  CO2 31  GLUCOSE 162*  BUN 22*  CREATININE 0.85  CALCIUM 9.0    PT/INR: No results for input(s): LABPROT, INR in the last 72 hours. ABG    Component Value Date/Time   PHART 7.516* 08/18/2012 0828   HCO3 23.1 08/18/2012 0828   TCO2 24 08/18/2012 0828   ACIDBASEDEF 0.8 08/18/2012 0447   O2SAT 99.0 08/18/2012 0828   CBG (last 3)   Recent Labs  11/23/15 1720 11/23/15 2313 11/24/15 0642  GLUCAP 145* 151* 141*    Assessment/Plan:  1. Chest tube removed this morning- repeat CXR in AM 2. Care per primary   LOS: 4 days    Mark [name], Mark [name] 11/24/2015  Patient seen and examined, agree with above  Viviann SpareSteven C. Dorris FetchHendrickson, MD [facility] [phone]

## 2015-11-25 ENCOUNTER — Inpatient Hospital Stay (HOSPITAL_COMMUNITY): Payer: Medicare Other

## 2015-11-25 DIAGNOSIS — I480 Paroxysmal atrial fibrillation: Secondary | ICD-10-CM

## 2015-11-25 LAB — APTT: aPTT: 66 seconds — ABNORMAL HIGH (ref 24–37)

## 2015-11-25 LAB — CBC
HCT: 37.7 % — ABNORMAL LOW (ref 39.0–52.0)
HEMOGLOBIN: 11.4 g/dL — AB (ref 13.0–17.0)
MCH: 29.2 pg (ref 26.0–34.0)
MCHC: 30.2 g/dL (ref 30.0–36.0)
MCV: 96.7 fL (ref 78.0–100.0)
Platelets: 225 10*3/uL (ref 150–400)
RBC: 3.9 MIL/uL — ABNORMAL LOW (ref 4.22–5.81)
RDW: 14.3 % (ref 11.5–15.5)
WBC: 12.3 10*3/uL — AB (ref 4.0–10.5)

## 2015-11-25 LAB — GLUCOSE, CAPILLARY
GLUCOSE-CAPILLARY: 143 mg/dL — AB (ref 65–99)
GLUCOSE-CAPILLARY: 193 mg/dL — AB (ref 65–99)
GLUCOSE-CAPILLARY: 192 mg/dL — AB (ref 65–99)
Glucose-Capillary: 146 mg/dL — ABNORMAL HIGH (ref 65–99)

## 2015-11-25 LAB — HEPARIN LEVEL (UNFRACTIONATED): HEPARIN UNFRACTIONATED: 0.57 [IU]/mL (ref 0.30–0.70)

## 2015-11-25 MED ORDER — METHYLPREDNISOLONE SODIUM SUCC 40 MG IJ SOLR
40.0000 mg | INTRAMUSCULAR | Status: DC
  Administered 2015-11-26 – 2015-11-27 (×2): 40 mg via INTRAVENOUS
  Filled 2015-11-25 (×2): qty 1

## 2015-11-25 MED ORDER — IPRATROPIUM BROMIDE 0.02 % IN SOLN
0.5000 mg | Freq: Three times a day (TID) | RESPIRATORY_TRACT | Status: DC
  Administered 2015-11-25 – 2015-11-28 (×10): 0.5 mg via RESPIRATORY_TRACT
  Filled 2015-11-25 (×10): qty 2.5

## 2015-11-25 MED ORDER — LEVALBUTEROL HCL 0.63 MG/3ML IN NEBU
0.6300 mg | INHALATION_SOLUTION | Freq: Three times a day (TID) | RESPIRATORY_TRACT | Status: DC
  Administered 2015-11-25 – 2015-11-28 (×10): 0.63 mg via RESPIRATORY_TRACT
  Filled 2015-11-25 (×10): qty 3

## 2015-11-25 MED ORDER — APIXABAN 5 MG PO TABS
5.0000 mg | ORAL_TABLET | Freq: Two times a day (BID) | ORAL | Status: DC
  Administered 2015-11-25 – 2015-11-28 (×7): 5 mg via ORAL
  Filled 2015-11-25 (×7): qty 1

## 2015-11-25 NOTE — Progress Notes (Addendum)
ANTICOAGULATION CONSULT NOTE - Follow Up Consult  Pharmacy Consult for heparin to Eliquis Indication: atrial fibrillation   Labs:  Recent Labs  11/23/15 0518 11/24/15 0334 11/25/15 0405  HGB 11.9* 11.3* 11.4*  HCT 38.8* 37.2* 37.7*  PLT 235 227 225  APTT 69* 62* 66*  HEPARINUNFRC 0.96* 0.66 0.57    Assessment: 75yo male w/ afib on heparin switching back to Eliquis.     Plan:  Start Eliqius 5 mg PO BID at the time heparin gtt is discontinued Monitor s/sx bleeding, CBC  Greggory Stallionristy Reyes, PharmD Clinical Pharmacy Resident Pager # [phone] 11/25/2015 8:05 AM

## 2015-11-25 NOTE — Progress Notes (Addendum)
      [address]       [place] [id number]             [phone]      Subjective:  No new complaints.  States medicine through IV at night helps him breath better.  Objective: Vital signs in last 24 hours: Temp:  [97.6 F (36.4 C)-97.8 F (36.6 C)] 97.8 F (36.6 C) (04/02 0635) Pulse Rate:  [60-67] 67 (04/02 0635) Cardiac Rhythm:  [-]  Resp:  [18-20] 18 (04/02 0635) BP: (94-120)/(54-73) 118/73 mmHg (04/02 0635) SpO2:  [87 %-98 %] 94 % (04/02 0746) Weight:  [165 lb 9.6 oz (75.116 kg)] 165 lb 9.6 oz (75.116 kg) (04/02 0635)  Intake/Output from previous day: 04/01 0701 - 04/02 0700 In: 1368 [P.O.:1080; I.V.:288] Out: 1578 [Urine:1575; Stool:3] Intake/Output this shift: Total I/O In: 3 [I.V.:3] Out: -   General appearance: alert, cooperative and no distress Heart: regular rate and rhythm Lungs: clear to auscultation bilaterally Abdomen: soft, non-tender; bowel sounds normal; no masses,  no organomegaly Wound: clean and dry  Lab Results:  Recent Labs  11/24/15 0334 11/25/15 0405  WBC 10.3 12.3*  HGB 11.3* 11.4*  HCT 37.2* 37.7*  PLT 227 225   BMET: No results for input(s): NA, K, CL, CO2, GLUCOSE, BUN, CREATININE, CALCIUM in the last 72 hours.  PT/INR: No results for input(s): LABPROT, INR in the last 72 hours. ABG    Component Value Date/Time   PHART 7.516* 08/18/2012 0828   HCO3 23.1 08/18/2012 0828   TCO2 24 08/18/2012 0828   ACIDBASEDEF 0.8 08/18/2012 0447   O2SAT 99.0 08/18/2012 0828   CBG (last 3)   Recent Labs  11/24/15 1610 11/24/15 2226 11/25/15 0656  GLUCAP 140* 95 143*    Assessment/Plan:  1. Chest tube- removed yesterday, follow up CXR is free from pneumothorax 2. Dispo- will sign off, follow up appointment placement in chart, care per primary please reconsult should issues arise   LOS: 5 days    Lowella DandyBARRETT, ERIN 11/25/2015  Agree with above  Viviann SpareSteven C. Dorris FetchHendrickson, MD [facility] [phone]

## 2015-11-25 NOTE — Progress Notes (Signed)
Progress Note   Mark [name]:[id number] DOB: 1941/06/06 DOA: 11/20/2015 PCP: [name], MD   Brief Narrative:   Mark [name] is an 75 y.o. male with history of COPD on 4 L O2 via nasal cannula, chronic prednisone, A. fib on Eliquis, history of CVA history of spontaneous right-sided pneumothorax 3 years back requiring thoracotomy and chest tube, remote history of smoking, chronic diastolic CHF, stroke, GERD, presented with feeling sick and increasing lethargy for almost 8-10 weeks. He also had productive cough with wheezing and increasing shortness of breath for 1 week presented to the hospital with progressive dyspnea and left-sided chest pain. In the ED he was found to have left-sided spontaneous pneumothorax. Status post chest tube placement on admission, removed 12-16-2015.  Assessment/Plan:   Spontaneous left pneumothorax Status post chest tube placement. Chest tube removed Dec 16, 2015. Follow-up chest x-ray negative for pneumothorax. Pain control with Vicodin, and Toradol when necessary.  Acute on chronic hypoxic respiratory failure secondary to COPD exacerbation Continue O2 via nasal cannula, IV Solu-Medrol, Pulmicort, when necessary albuterol neb and empiric azithromycin. Supportive care with Tylenol and antitussives. Became short of breath overnight 11/23/15 with follow-up chest x-ray unremarkable. Xanax added for anxiety.We'll begin to wean Solu-Medrol.  A. fib with RVR/PVCs Cardizem added. Continue verapamil and Rythmol. Stop IV heparin. Resume Eliquis.   History of CVA Continue Lipitor  Chronic diastolic CHF Stable.  GERD Continue PPI.  Steroid-induced hyperglycemia CBGs 95-159. Continue insulin sensitive SSI.  Mild acute on chronic diastolic CHF EF of 50%. On daily Lasix.  Leukocytosis Possibly stress-induced versus chronic steroid use. No signs of acute infection. Monitor  Hypermagnesemia Magnesium supplementation discontinued.  DVT prophylaxis  IV  heparin.   Family Communication/Anticipated D/C date and plan/Code Status   Family Communication: Wife updated at the bedside. Disposition Plan/date: From home with wife. Home health PT recommended. Hopefully home tomorrow. Code Status: Full code.   Procedures and diagnostic studies:   Dg Chest Port 1 View  12-16-2015  CLINICAL DATA:  Followup left pneumothorax.  Emphysema. EXAM: PORTABLE CHEST 1 VIEW COMPARISON:  11/23/2015 FINDINGS: Left-sided chest tube has been removed. No residual pneumothorax visualized. Mild residual infiltrate or atelectasis seen at the left lung base. Severe emphysema again demonstrated. Surgical staples seen in the right lung field with chronic pleural thickening at the right lung base. Heart size normal. IMPRESSION: No evidence of pneumothorax following left chest tube removal. Persistent mild left basilar atelectasis versus infiltrate. Severe emphysema and postop changes in right hemithorax. Electronically Signed   By: Myles Rosenthal M.D.   On: 12-16-2015 14:06   Dg Chest Port 1 View  11/23/2015  CLINICAL DATA:  Shortness of breath. EXAM: PORTABLE CHEST 1 VIEW COMPARISON:  11/22/2015. FINDINGS: Mediastinum and hilar structures normal. Left chest tube in stable position. No pneumothorax. Mild left base atelectasis and or infiltrate. Bibasilar pleural thickening again noted consistent with scarring. Surgical sutures noted over the right upper and right lower lung. COPD. Stable cardiomegaly. IMPRESSION: 1.  Left chest tube in stable position.  No pneumothorax. 2. Mild left base atelectasis and or infiltrate . 3. Bilateral pleural parenchymal scarring. Postsurgical changes right lung. COPD. 4. Stable cardiomegaly.  No pulmonary venous congestion. Electronically Signed   By: Maisie Fus  Register   On: 11/23/2015 07:43   Dg Chest Port 1 View  11/22/2015  CLINICAL DATA:  75 year old male with shortness of breath EXAM: PORTABLE CHEST 1 VIEW COMPARISON:  Radiograph dated 11/22/2015  FINDINGS: Single-view of chest demonstrates emphysematous  changes of the lungs. Left-sided chest tube appears in stable positioning. No pneumothorax identified. Left lung base densities similar to prior study and likely atelectasis in nature. Postsurgical changes of the right lung with scarring. There is blunting of the right costophrenic angle which may postsurgical. A small right pleural effusion is not excluded. The cardiac silhouette is within normal limits with no acute osseous pathology. IMPRESSION: No interval change.  No pneumothorax. Electronically Signed   By: Elgie CollardArash  Radparvar M.D.   On: 11/22/2015 23:23   Dg Chest Port 1 View  11/22/2015  CLINICAL DATA:  Shortness of breath this morning, improving. Left chest tightness. History of hypertension, a ablation for atrial flutter 5 years ago. History of COPD requiring home O2. EXAM: PORTABLE CHEST 1 VIEW COMPARISON:  Chest x-rays dated 11/21/2015, 11/20/2015 and 05/03/2015. FINDINGS: Left-sided chest tube stable in position. No pneumothorax seen. Persistent streaky opacities at the left lung base, compatible with atelectasis. Stable scarring within the right lung. Additional stable postsurgical changes within the right lung. Heart size is upper normal, stable. Overall cardiomediastinal silhouette is stable in size and configuration. Osseous structures about the chest are unremarkable. IMPRESSION: 1. Stable position of the left-sided chest tube. No pneumothorax seen. 2. Persistent opacity at the left lung base, most suggestive of atelectasis. Stable scarring and postsurgical change within the right lung. No new lung findings. Electronically Signed   By: Bary RichardStan  Maynard M.D.   On: 11/22/2015 12:12   Dg Chest Port 1 View  11/21/2015  CLINICAL DATA:  New onset of shortness of breath. Patient has a left chest tube. EXAM: PORTABLE CHEST 1 VIEW COMPARISON:  11/20/2015 FINDINGS: Stable position of the left chest tube. No evidence for a left pneumothorax. Left  basilar densities are suggestive for atelectasis. Stable blunting and probable scarring at the right costophrenic angle. Again noted are postsurgical changes in the right lung. Heart size is within normal limits and stable. The trachea remains midline. IMPRESSION: Stable position of the left chest tube without a pneumothorax. Left basilar atelectasis. Electronically Signed   By: Richarda OverlieAdam  Henn M.D.   On: 11/21/2015 07:35   Dg Chest Portable 1 View  11/20/2015  CLINICAL DATA:  Chest tube insertion. EXAM: PORTABLE CHEST 1 VIEW COMPARISON:  Earlier today FINDINGS: New left chest tube with tip over the mid to upper chest. Left pneumothorax is decreased, now trace. There is now chest wall gas on the left. No mediastinal widening. No pulmonary edema or consolidation. Stable heart size. Emphysema with postsurgical changes on the right. IMPRESSION: Nearly resolved left pneumothorax after chest tube insertion. Electronically Signed   By: Marnee SpringJonathon  Watts M.D.   On: 11/20/2015 16:24   Dg Chest Portable 1 View  11/20/2015  CLINICAL DATA:  Severe respiratory distress. EXAM: PORTABLE CHEST 1 VIEW COMPARISON:  08/06/2015 FINDINGS: Advanced emphysema. There is postsurgical changes on the right with chain sutures and scarring along the right diaphragm. Remote right rib fractures versus thoracotomy changes. Left pneumothorax estimated at 20%, greatest at the base where there is trace pleural fluid. No edema or pneumonia. Normal heart size and mediastinal contours. Critical Value/emergent results were called by telephone at the time of interpretation on 11/20/2015 at 3:04 pm to Dr. Marily MemosJASON MESNER , who verbally acknowledged these results. IMPRESSION: 1. Left pneumothorax estimated at 20%. 2. Severe emphysema. Electronically Signed   By: Marnee SpringJonathon  Watts M.D.   On: 11/20/2015 15:04    Medical Consultants:    CVTS  Anti-Infectives:   Azithromycin 11/20/15--->   Subjective:  Mark [name] is having some chest pain around  chest tube insertion site (tube out).  Dyspnea improved. Has a non-productive cough.  Appetite improving.  Had a BM today.  Objective:    Filed Vitals:   11/25/15 0128 11/25/15 0635 11/25/15 0740 11/25/15 0746  BP:  118/73    Pulse:  67    Temp:  97.8 F (36.6 C)    TempSrc:  Oral    Resp:  18    Height:      Weight:  75.116 kg (165 lb 9.6 oz)    SpO2: 96% 96% 94% 94%    Intake/Output Summary (Last 24 hours) at 11/25/15 0800 Last data filed at 11/25/15 0600  Gross per 24 hour  Intake   1368 ml  Output   1278 ml  Net     90 ml   Filed Weights   11/22/15 0500 11/24/15 0704 11/25/15 0635  Weight: 75.161 kg (165 lb 11.2 oz) 75.932 kg (167 lb 6.4 oz) 75.116 kg (165 lb 9.6 oz)    Exam: Gen:  NAD Cardiovascular:  RRR, No M/R/G Respiratory:  Lungs diminished at the bases Gastrointestinal:  Abdomen soft, NT/ND, + BS Extremities:  No C/E/C   Data Reviewed:    Labs: Basic Metabolic Panel:  Recent Labs Lab 11/20/15 1440 11/21/15 0358 11/22/15 0300 11/22/15 1627  NA 141 140 138  --   K 4.6 4.6 4.5  --   CL 101 98* 97*  --   CO2 28 27 31   --   GLUCOSE 250* 166* 162*  --   BUN 13 17 22*  --   CREATININE 1.01 1.18 0.85  --   CALCIUM 9.0 9.1 9.0  --   MG  --   --   --  2.6*   GFR Estimated Creatinine Clearance: 79.8 mL/min (by C-G formula based on Cr of 0.85). Liver Function Tests:  Recent Labs Lab 11/20/15 1440  AST 24  ALT 22  ALKPHOS 56  BILITOT 0.6  PROT 7.7  ALBUMIN 3.7   CBC:  Recent Labs Lab 11/20/15 1440 11/21/15 0358 11/22/15 0300 11/23/15 0518 11/24/15 0334 11/25/15 0405  WBC 18.6* 15.1* 17.2* 16.0* 10.3 12.3*  NEUTROABS 11.2*  --   --   --   --   --   HGB 13.0 12.3* 11.3* 11.9* 11.3* 11.4*  HCT 43.2 40.6 35.8* 38.8* 37.2* 37.7*  MCV 101.6* 100.0 97.8 99.2 98.4 96.7  PLT 305 262 220 235 227 225   Cardiac Enzymes:  Recent Labs Lab 11/20/15 1816  TROPONINI 0.06*   CBG:  Recent Labs Lab 11/24/15 0642 11/24/15 1237  11/24/15 1610 11/24/15 2226 11/25/15 0656  GLUCAP 141* 159* 140* 95 143*   Sepsis Labs:  Recent Labs Lab 11/21/15 1325 11/22/15 0300 11/23/15 0518 11/24/15 0334 11/25/15 0405  PROCALCITON 0.50 0.36 0.26  --   --   WBC  --  17.2* 16.0* 10.3 12.3*   Microbiology Recent Results (from the past 240 hour(s))  MRSA PCR Screening     Status: None   Collection Time: 11/21/15 12:05 AM  Result Value Ref Range Status   MRSA by PCR NEGATIVE NEGATIVE Final    Comment:        The GeneXpert MRSA Assay (FDA approved for NASAL specimens only), is one component of a comprehensive MRSA colonization surveillance program. It is not intended to diagnose MRSA infection nor to guide or monitor treatment for MRSA infections.      Medications:   .  alfuzosin  10 mg Oral Daily  . atorvastatin  20 mg Oral Daily  . azithromycin  500 mg Oral Daily  . budesonide (PULMICORT) nebulizer solution  0.25 mg Nebulization BID  . calcium-vitamin D  1 tablet Oral Q breakfast  . diltiazem  60 mg Oral 4 times per day  . insulin aspart  0-5 Units Subcutaneous QHS  . insulin aspart  0-9 Units Subcutaneous TID WC  . ipratropium  0.5 mg Nebulization TID  . levalbuterol  0.63 mg Nebulization TID  . methylPREDNISolone (SOLU-MEDROL) injection  40 mg Intravenous Q12H  . mirabegron ER  50 mg Oral Daily  . pantoprazole  40 mg Oral Daily  . propafenone  225 mg Oral BID  . sodium chloride flush  3 mL Intravenous Q12H   Continuous Infusions: . heparin 1,200 Units/hr (11/23/15 1505)    Time spent: 25 minutes.   LOS: 5 days   Styles Fambro  [facility] Pager [phone]. If unable to reach me by pager, please call my cell phone at [phone].  *Please refer to [email or web address], password [email or web address] to get updated schedule on who will round on this patient, as hospitalists switch teams weekly. If 7PM-7AM, please contact night-coverage at [email or web address], password [email or web address] for any overnight needs.  11/25/2015, 8:00 AM

## 2015-11-26 LAB — BASIC METABOLIC PANEL
Anion gap: 9 (ref 5–15)
BUN: 31 mg/dL — AB (ref 6–20)
CO2: 32 mmol/L (ref 22–32)
CREATININE: 0.97 mg/dL (ref 0.61–1.24)
Calcium: 9.3 mg/dL (ref 8.9–10.3)
Chloride: 99 mmol/L — ABNORMAL LOW (ref 101–111)
GFR calc Af Amer: >60 mL/min (ref >60)
GFR calc non Af Amer: >60 mL/min (ref >60)
GLUCOSE: 145 mg/dL — AB (ref 65–99)
Potassium: 4.7 mmol/L (ref 3.5–5.1)
Sodium: 140 mmol/L (ref 135–145)

## 2015-11-26 LAB — CBC
HCT: 39.4 % (ref 39.0–52.0)
Hemoglobin: 11.9 g/dL — ABNORMAL LOW (ref 13.0–17.0)
MCH: 29.7 pg (ref 26.0–34.0)
MCHC: 30.2 g/dL (ref 30.0–36.0)
MCV: 98.3 fL (ref 78.0–100.0)
Platelets: 225 10*3/uL (ref 150–400)
RBC: 4.01 MIL/uL — ABNORMAL LOW (ref 4.22–5.81)
RDW: 14.4 % (ref 11.5–15.5)
WBC: 14 10*3/uL — ABNORMAL HIGH (ref 4.0–10.5)

## 2015-11-26 LAB — GLUCOSE, CAPILLARY
GLUCOSE-CAPILLARY: 176 mg/dL — AB (ref 65–99)
Glucose-Capillary: 120 mg/dL — ABNORMAL HIGH (ref 65–99)
Glucose-Capillary: 142 mg/dL — ABNORMAL HIGH (ref 65–99)

## 2015-11-26 LAB — MAGNESIUM: MAGNESIUM: 2.3 mg/dL (ref 1.7–2.4)

## 2015-11-26 MED ORDER — FUROSEMIDE 40 MG PO TABS
40.0000 mg | ORAL_TABLET | Freq: Every day | ORAL | Status: DC
  Administered 2015-11-27: 40 mg via ORAL
  Filled 2015-11-26: qty 1

## 2015-11-26 MED ORDER — FUROSEMIDE 10 MG/ML IJ SOLN
INTRAMUSCULAR | Status: AC
  Filled 2015-11-26: qty 4

## 2015-11-26 MED ORDER — FUROSEMIDE 40 MG PO TABS: 40.0000 mg | ORAL_TABLET | Freq: Every day | ORAL | Status: DC

## 2015-11-26 MED ORDER — FUROSEMIDE 10 MG/ML IJ SOLN
40.0000 mg | Freq: Once | INTRAMUSCULAR | Status: AC
  Administered 2015-11-26: 40 mg via INTRAVENOUS
  Filled 2015-11-26: qty 4

## 2015-11-26 NOTE — Care Management Important Message (Signed)
Important Message  Patient Details  Name: Mark [name] MRN: [id number] Date of Birth: 07/10/1941   Medicare Important Message Given:  Yes    Mark [name] 11/26/2015, 12:45 PM

## 2015-11-26 NOTE — Progress Notes (Signed)
Progress Note   [name] WJX:[id number] DOB: [birth year] DOA: 11/20/2015 PCP: [name], MD   Brief Narrative:   Mark [name] is an 75 y.o. male with history of COPD on 4 L O2 via nasal cannula, chronic prednisone, A. fib on Eliquis, history of CVA history of spontaneous right-sided pneumothorax 3 years back requiring thoracotomy and chest tube, remote history of smoking, chronic diastolic CHF, stroke, GERD, presented with feeling sick and increasing lethargy for almost 8-10 weeks. He also had productive cough with wheezing and increasing shortness of breath for 1 week presented to the hospital with progressive dyspnea and left-sided chest pain. In the ED he was found to have left-sided spontaneous pneumothorax. Status post chest tube placement on admission, removed 11/24/15.  Assessment/Plan:   Spontaneous left pneumothorax Status post chest tube placement. Chest tube removed 11/24/15. Follow-up chest x-ray negative for pneumothorax. Pain control with Vicodin, and Toradol when necessary.  Acute on chronic hypoxic respiratory failure secondary to COPD exacerbation Continue O2 via nasal cannula, IV Solu-Medrol, Pulmicort, when necessary albuterol neb and empiric azithromycin. Supportive care with Tylenol and antitussives. Became short of breath overnight 11/23/15 with follow-up chest x-ray unremarkable. Xanax added for anxiety. Solu-Medrol being weaned, but still extremely short of breath with just standing to use bedside commode. Resume Lasix.  A. fib with RVR/PVCs Cardizem added. Continue verapamil and Rythmol. Continue Eliquis.   History of CVA Continue Lipitor  GERD Continue PPI.  Steroid-induced hyperglycemia CBGs 120-193. Continue insulin sensitive SSI.  Mild acute on chronic diastolic CHF EF of 50%. On daily Lasix at home, which was held. Resume.  Leukocytosis Possibly stress-induced versus chronic steroid use. No signs of acute infection.  Monitor  Hypermagnesemia Magnesium supplementation discontinued. Magnesium now WNL.  DVT prophylaxis  IV heparin.   Family Communication/Anticipated D/C date and plan/Code Status   Family Communication: Wife updated at the bedside. Disposition Plan/date: From home with wife. Home health PT recommended. Hopefully home tomorrow. Code Status: Full code.   Procedures and diagnostic studies:   Dg Chest Port 1 View  2015-12-02  CLINICAL DATA:  Follow-up pneumothorax. EXAM: PORTABLE CHEST 1 VIEW COMPARISON:  11/24/2015 and prior exams FINDINGS: Right lung postsurgical changes are identified. There is no evidence of pneumothorax. Cardiomediastinal silhouette is unchanged. Minimal left basilar atelectasis noted. IMPRESSION: Unchanged appearance of the chest- no evidence of pneumothorax. Minimal left basilar atelectasis. Electronically Signed   By: Harmon Pier M.D.   On: 02-Dec-2015 09:49   Dg Chest Port 1 View  11/24/2015  CLINICAL DATA:  Followup left pneumothorax.  Emphysema. EXAM: PORTABLE CHEST 1 VIEW COMPARISON:  11/23/2015 FINDINGS: Left-sided chest tube has been removed. No residual pneumothorax visualized. Mild residual infiltrate or atelectasis seen at the left lung base. Severe emphysema again demonstrated. Surgical staples seen in the right lung field with chronic pleural thickening at the right lung base. Heart size normal. IMPRESSION: No evidence of pneumothorax following left chest tube removal. Persistent mild left basilar atelectasis versus infiltrate. Severe emphysema and postop changes in right hemithorax. Electronically Signed   By: Myles Rosenthal M.D.   On: 11/24/2015 14:06   Dg Chest Port 1 View  11/23/2015  CLINICAL DATA:  Shortness of breath. EXAM: PORTABLE CHEST 1 VIEW COMPARISON:  11/22/2015. FINDINGS: Mediastinum and hilar structures normal. Left chest tube in stable position. No pneumothorax. Mild left base atelectasis and or infiltrate. Bibasilar pleural thickening again noted  consistent with scarring. Surgical sutures noted over the right upper and right lower  lung. COPD. Stable cardiomegaly. IMPRESSION: 1.  Left chest tube in stable position.  No pneumothorax. 2. Mild left base atelectasis and or infiltrate . 3. Bilateral pleural parenchymal scarring. Postsurgical changes right lung. COPD. 4. Stable cardiomegaly.  No pulmonary venous congestion. Electronically Signed   By: Maisie Fus  Register   On: 11/23/2015 07:43   Dg Chest Port 1 View  11/22/2015  CLINICAL DATA:  75 year old male with shortness of breath EXAM: PORTABLE CHEST 1 VIEW COMPARISON:  Radiograph dated 11/22/2015 FINDINGS: Single-view of chest demonstrates emphysematous changes of the lungs. Left-sided chest tube appears in stable positioning. No pneumothorax identified. Left lung base densities similar to prior study and likely atelectasis in nature. Postsurgical changes of the right lung with scarring. There is blunting of the right costophrenic angle which may postsurgical. A small right pleural effusion is not excluded. The cardiac silhouette is within normal limits with no acute osseous pathology. IMPRESSION: No interval change.  No pneumothorax. Electronically Signed   By: Elgie Collard M.D.   On: 11/22/2015 23:23   Dg Chest Port 1 View  11/22/2015  CLINICAL DATA:  Shortness of breath this morning, improving. Left chest tightness. History of hypertension, a ablation for atrial flutter 5 years ago. History of COPD requiring home O2. EXAM: PORTABLE CHEST 1 VIEW COMPARISON:  Chest x-rays dated 11/21/2015, 11/20/2015 and 05/03/2015. FINDINGS: Left-sided chest tube stable in position. No pneumothorax seen. Persistent streaky opacities at the left lung base, compatible with atelectasis. Stable scarring within the right lung. Additional stable postsurgical changes within the right lung. Heart size is upper normal, stable. Overall cardiomediastinal silhouette is stable in size and configuration. Osseous structures about  the chest are unremarkable. IMPRESSION: 1. Stable position of the left-sided chest tube. No pneumothorax seen. 2. Persistent opacity at the left lung base, most suggestive of atelectasis. Stable scarring and postsurgical change within the right lung. No new lung findings. Electronically Signed   By: Bary Richard M.D.   On: 11/22/2015 12:12   Dg Chest Port 1 View  11/21/2015  CLINICAL DATA:  New onset of shortness of breath. Patient has a left chest tube. EXAM: PORTABLE CHEST 1 VIEW COMPARISON:  11/20/2015 FINDINGS: Stable position of the left chest tube. No evidence for a left pneumothorax. Left basilar densities are suggestive for atelectasis. Stable blunting and probable scarring at the right costophrenic angle. Again noted are postsurgical changes in the right lung. Heart size is within normal limits and stable. The trachea remains midline. IMPRESSION: Stable position of the left chest tube without a pneumothorax. Left basilar atelectasis. Electronically Signed   By: Richarda Overlie M.D.   On: 11/21/2015 07:35   Dg Chest Portable 1 View  11/20/2015  CLINICAL DATA:  Chest tube insertion. EXAM: PORTABLE CHEST 1 VIEW COMPARISON:  Earlier today FINDINGS: New left chest tube with tip over the mid to upper chest. Left pneumothorax is decreased, now trace. There is now chest wall gas on the left. No mediastinal widening. No pulmonary edema or consolidation. Stable heart size. Emphysema with postsurgical changes on the right. IMPRESSION: Nearly resolved left pneumothorax after chest tube insertion. Electronically Signed   By: Marnee Spring M.D.   On: 11/20/2015 16:24   Dg Chest Portable 1 View  11/20/2015  CLINICAL DATA:  Severe respiratory distress. EXAM: PORTABLE CHEST 1 VIEW COMPARISON:  08/06/2015 FINDINGS: Advanced emphysema. There is postsurgical changes on the right with chain sutures and scarring along the right diaphragm. Remote right rib fractures versus thoracotomy changes. Left pneumothorax estimated  at 20%, greatest at the base where there is trace pleural fluid. No edema or pneumonia. Normal heart size and mediastinal contours. Critical Value/emergent results were called by telephone at the time of interpretation on 11/20/2015 at 3:04 pm to Dr. Marily MemosJASON MESNER , who verbally acknowledged these results. IMPRESSION: 1. Left pneumothorax estimated at 20%. 2. Severe emphysema. Electronically Signed   By: Marnee SpringJonathon  Watts M.D.   On: 11/20/2015 15:04    Medical Consultants:    CVTS  Anti-Infectives:   Azithromycin 11/20/15--->   Subjective:   Mark [name] is having some chest pain around chest tube insertion site (tube out).  Dyspnea improved. Has a non-productive cough.  Appetite improving.  Had a BM today.  Objective:    Filed Vitals:   11/25/15 1334 11/25/15 2018 11/25/15 2139 11/26/15 0457  BP:   128/57 120/62  Pulse:   68 80  Temp:   98 F (36.7 C)   TempSrc:   Oral Oral  Resp:   20   Height:      Weight:      SpO2: 91% 94% 98% 92%    Intake/Output Summary (Last 24 hours) at 11/26/15 0807 Last data filed at 11/26/15 0500  Gross per 24 hour  Intake    943 ml  Output    527 ml  Net    416 ml   Filed Weights   11/22/15 0500 11/24/15 0704 11/25/15 0635  Weight: 75.161 kg (165 lb 11.2 oz) 75.932 kg (167 lb 6.4 oz) 75.116 kg (165 lb 9.6 oz)    Exam: Gen:  NAD Cardiovascular:  RRR, No M/R/G Respiratory:  Lungs diminished at the bases, Occasional crackles Gastrointestinal:  Abdomen soft, NT/ND, + BS Extremities:  No C/E/C   Data Reviewed:    Labs: Basic Metabolic Panel:  Recent Labs Lab 11/20/15 1440 11/21/15 0358 11/22/15 0300 11/22/15 1627 11/26/15 0549  NA 141 140 138  --  140  K 4.6 4.6 4.5  --  4.7  CL 101 98* 97*  --  99*  CO2 28 27 31   --  32  GLUCOSE 250* 166* 162*  --  145*  BUN 13 17 22*  --  31*  CREATININE 1.01 1.18 0.85  --  0.97  CALCIUM 9.0 9.1 9.0  --  9.3  MG  --   --   --  2.6* 2.3   GFR Estimated Creatinine Clearance: 69.9  mL/min (by C-G formula based on Cr of 0.97). Liver Function Tests:  Recent Labs Lab 11/20/15 1440  AST 24  ALT 22  ALKPHOS 56  BILITOT 0.6  PROT 7.7  ALBUMIN 3.7   CBC:  Recent Labs Lab 11/20/15 1440  11/22/15 0300 11/23/15 0518 11/24/15 0334 11/25/15 0405 11/26/15 0549  WBC 18.6*  < > 17.2* 16.0* 10.3 12.3* 14.0*  NEUTROABS 11.2*  --   --   --   --   --   --   HGB 13.0  < > 11.3* 11.9* 11.3* 11.4* 11.9*  HCT 43.2  < > 35.8* 38.8* 37.2* 37.7* 39.4  MCV 101.6*  < > 97.8 99.2 98.4 96.7 98.3  PLT 305  < > 220 235 227 225 225  < > = values in this interval not displayed. Cardiac Enzymes:  Recent Labs Lab 11/20/15 1816  TROPONINI 0.06*   CBG:  Recent Labs Lab 11/25/15 0656 11/25/15 1243 11/25/15 1703 11/25/15 2154 11/26/15 0659  GLUCAP 143* 193* 192* 146* 120*   Sepsis Labs:  Recent Labs  Lab 11/21/15 1325 11/22/15 0300 11/23/15 0518 11/24/15 0334 11/25/15 0405 11/26/15 0549  PROCALCITON 0.50 0.36 0.26  --   --   --   WBC  --  17.2* 16.0* 10.3 12.3* 14.0*   Microbiology Recent Results (from the past 240 hour(s))  MRSA PCR Screening     Status: None   Collection Time: 11/21/15 12:05 AM  Result Value Ref Range Status   MRSA by PCR NEGATIVE NEGATIVE Final    Comment:        The GeneXpert MRSA Assay (FDA approved for NASAL specimens only), is one component of a comprehensive MRSA colonization surveillance program. It is not intended to diagnose MRSA infection nor to guide or monitor treatment for MRSA infections.      Medications:   . alfuzosin  10 mg Oral Daily  . apixaban  5 mg Oral BID  . atorvastatin  20 mg Oral Daily  . azithromycin  500 mg Oral Daily  . budesonide (PULMICORT) nebulizer solution  0.25 mg Nebulization BID  . calcium-vitamin D  1 tablet Oral Q breakfast  . diltiazem  60 mg Oral 4 times per day  . insulin aspart  0-5 Units Subcutaneous QHS  . insulin aspart  0-9 Units Subcutaneous TID WC  . ipratropium  0.5 mg  Nebulization TID  . levalbuterol  0.63 mg Nebulization TID  . methylPREDNISolone (SOLU-MEDROL) injection  40 mg Intravenous Q24H  . mirabegron ER  50 mg Oral Daily  . pantoprazole  40 mg Oral Daily  . propafenone  225 mg Oral BID  . sodium chloride flush  3 mL Intravenous Q12H   Continuous Infusions:    Time spent: 25 minutes.   LOS: 6 days   Thea Holshouser  [facility] Pager [phone]. If unable to reach me by pager, please call my cell phone at [phone].  *Please refer to [email or web address], password [email or web address] to get updated schedule on who will round on this patient, as hospitalists switch teams weekly. If 7PM-7AM, please contact night-coverage at [email or web address], password [email or web address] for any overnight needs.  11/26/2015, 8:07 AM

## 2015-11-26 NOTE — Discharge Instructions (Signed)

## 2015-11-27 ENCOUNTER — Inpatient Hospital Stay (HOSPITAL_COMMUNITY): Payer: Medicare Other

## 2015-11-27 LAB — GLUCOSE, CAPILLARY
GLUCOSE-CAPILLARY: 149 mg/dL — AB (ref 65–99)
GLUCOSE-CAPILLARY: 138 mg/dL — AB (ref 65–99)
GLUCOSE-CAPILLARY: 251 mg/dL — AB (ref 65–99)
GLUCOSE-CAPILLARY: 142 mg/dL — AB (ref 65–99)
Glucose-Capillary: 147 mg/dL — ABNORMAL HIGH (ref 65–99)

## 2015-11-27 MED ORDER — FUROSEMIDE 10 MG/ML IJ SOLN
40.0000 mg | Freq: Two times a day (BID) | INTRAMUSCULAR | Status: DC
  Administered 2015-11-27 – 2015-11-28 (×2): 40 mg via INTRAVENOUS
  Filled 2015-11-27 (×2): qty 4

## 2015-11-27 MED ORDER — ALPRAZOLAM 0.25 MG PO TABS: 0.2500 mg | ORAL_TABLET | Freq: Two times a day (BID) | ORAL | Status: DC | PRN

## 2015-11-27 MED ORDER — HYDROCODONE-ACETAMINOPHEN 5-325 MG PO TABS
1.0000 | ORAL_TABLET | Freq: Four times a day (QID) | ORAL | Status: DC | PRN
Start: 2015-11-27 — End: 2015-11-28

## 2015-11-27 NOTE — Progress Notes (Signed)
Physical Therapy Treatment Patient Details Name: Mark [name] MRN: [id number] DOB: 01/13/1941 Today's Date: 11/27/2015    History of Present Illness 75 year old male with history of COPD on 4 L O2 via nasal cannula, chronic prednisone, A. fib on Eliquis, history of CVA history of spontaneous right-sided pneumothorax 3 years back requiring thoracotomy and chest tube, remote history of smoking, chronic diastolic CHF, stroke, GERD, presented with feeling sick and increasing lethargy for almost 8-10 weeks. He also had productive cough with wheezing and increasing shortness of breath for 1 week presented to the hospital with progressive dyspnea and left-sided chest pain.    PT Comments    Patient making gradual progress with PT as pt able to ambulate 50 feet with rw during session. SpO2 dropping to 85% on 6L but returning to 90% with standing rest. Pt reporting that he is hoping to be able to D/C to home once released. Spouse present for session and in agreement with D/C plan. PT to continue to follow and progress mobility as tolerated.    Follow Up Recommendations  Home health PT     Equipment Recommendations  None recommended by PT    Recommendations for Other Services       Precautions / Restrictions Precautions Precautions: Fall Restrictions Weight Bearing Restrictions: No    Mobility  Bed Mobility Overal bed mobility: Needs Assistance Bed Mobility: Supine to Sit     Supine to sit: Min assist     General bed mobility comments: min assist with trunk   Transfers Overall transfer level: Needs assistance Equipment used: Rolling walker (2 wheeled) Transfers: Sit to/from Stand Sit to Stand: Min assist         General transfer comment: cues for hand position when standing.   Ambulation/Gait Ambulation/Gait assistance: Min guard Ambulation Distance (Feet): 50 Feet Assistive device: Rolling walker (2 wheeled) Gait Pattern/deviations: Step-through pattern Gait velocity:  decreased.    General Gait Details: pt taking 2 standing breaks during ambulation, SpO2 85-95%. On 6L/min O2.    Stairs            Wheelchair Mobility    Modified Rankin (Stroke Patients Only)       Balance Overall balance assessment: Needs assistance Sitting-balance support: No upper extremity supported Sitting balance-Leahy Scale: Good     Standing balance support: Bilateral upper extremity supported Standing balance-Leahy Scale: Poor Standing balance comment: using rw                    Cognition Arousal/Alertness: Awake/alert Behavior During Therapy: WFL for tasks assessed/performed Overall Cognitive Status: Within Functional Limits for tasks assessed                      Exercises      General Comments        Pertinent Vitals/Pain Pain Assessment: Faces Faces Pain Scale: Hurts a little bit Pain Location: chest (from having to breathe so hard. ) Pain Descriptors / Indicators: Sore Pain Intervention(s): Monitored during session    Home Living                      Prior Function            PT Goals (current goals can now be found in the care plan section) Acute Rehab PT Goals Patient Stated Goal: be able to go home.  PT Goal Formulation: With patient Time For Goal Achievement: 12/01/15 Potential to Achieve Goals: Fair Progress towards PT  goals: Progressing toward goals    Frequency  Min 3X/week    PT Plan Current plan remains appropriate    Co-evaluation             End of Session Equipment Utilized During Treatment: Oxygen;Gait belt Activity Tolerance: Patient limited by fatigue Patient left: in chair;with call bell/phone within reach;with family/visitor present     Time: 1610-9604 PT Time Calculation (min) (ACUTE ONLY): 24 min  Charges:  $Gait Training: 8-22 mins $Therapeutic Activity: 8-22 mins                    G Codes:      Christiane Ha, PT, CSCS Pager [phone] Office [phone]  11/27/2015, 4:25 PM

## 2015-11-27 NOTE — Care Management Note (Addendum)
Case Management Note  Patient Details  Name: Mark [name] MRN: [id number] Date of Birth: 03/26/1941  Subjective/Objective:       Admitted with Spontaneous Pneumothorax             Action/Plan: Patient lives at home with his spouse, use a cane, walker and has home oxygen at home. PCP is Dr Shelah LewandowskyElkin; [facility] of choice [facility], patient reports no problem getting his medication. [facility], choice offered, pt chose [facility]. [facility]. Attending MD at discharge please enter the [facility] face [facility] in Epic.  Expected Discharge Date:     Possibly 11/28/2015             Expected Discharge Plan:  Home w Home Health Services  Discharge planning Services  CM Consult    Choice offered to:  Patient     HH Arranged:  RN, PT, Nurse's Aide HH Agency:  [facility]  Status of Service:  In process, will continue to follow  Medicare Important Message Given:  Yes  Reola MosherChandler, Finlay Godbee L, RN,MHA,BSN [phone] 11/27/2015, 10:27 AM

## 2015-11-27 NOTE — Progress Notes (Addendum)
Progress Note   Mark [name] ZOX:[id number] DOB: 06-27-1941 DOA: 11/20/2015 PCP: [name], MD   Brief Narrative:   Mark [name] is an 75 y.o. male with history of COPD on 4 L O2 via nasal cannula, chronic prednisone, A. fib on Eliquis, history of CVA history of spontaneous right-sided pneumothorax 3 years back requiring thoracotomy and chest tube, remote history of smoking, chronic diastolic CHF, stroke, GERD, presented with feeling sick and increasing lethargy for almost 8-10 weeks. He also had productive cough with wheezing and increasing shortness of breath for 1 week presented to the hospital with progressive dyspnea and left-sided chest pain. In the ED he was found to have left-sided spontaneous pneumothorax. Status post chest tube placement on admission, removed 11/24/15.  Assessment/Plan:   Principle problems: Spontaneous left pneumothorax Status post chest tube placement. Chest tube removed 11/24/15. Follow-up chest x-ray negative for pneumothorax. Pain control with Vicodin, and Toradol when necessary.  Acute on chronic hypoxic respiratory failure secondary to COPD exacerbation Continue O2 via nasal cannula, IV Solu-Medrol, Pulmicort, when necessary albuterol neb and empiric azithromycin. Supportive care with Tylenol and antitussives. Became short of breath overnight 11/23/15 with follow-up chest x-ray unremarkable. Xanax added for anxiety. Solu-Medrol being weaned, but still extremely short of breath with just standing to use bedside commode. Lasix resumed 11/26/15. Recheck CXR.  A. fib with RVR/PVCs Cardizem added. Continue verapamil and Rythmol. Continue Eliquis.   Active Problems: Lethargy Reduce doses of Xanax, Hydrocodone.  History of CVA Continue Lipitor  GERD Continue PPI.  Steroid-induced hyperglycemia CBGs 120-193. Continue insulin sensitive SSI.  Mild acute on chronic diastolic CHF EF of 50%. Lasix resumed 11/26/15.  Leukocytosis Possibly  stress-induced versus chronic steroid use. No signs of acute infection. Monitor  Hypermagnesemia Magnesium supplementation discontinued. Magnesium now WNL.  DVT prophylaxis  IV heparin.   Family Communication/Anticipated D/C date and plan/Code Status   Family Communication: Wife updated at the bedside. Disposition Plan/date: From home with wife. Home health PT recommended. Hopefully home tomorrow. Too lethargic for D/C today. Code Status: Full code.   Procedures and diagnostic studies:   Dg Chest Port 1 View  12/15/2015  CLINICAL DATA:  Follow-up pneumothorax. EXAM: PORTABLE CHEST 1 VIEW COMPARISON:  11/24/2015 and prior exams FINDINGS: Right lung postsurgical changes are identified. There is no evidence of pneumothorax. Cardiomediastinal silhouette is unchanged. Minimal left basilar atelectasis noted. IMPRESSION: Unchanged appearance of the chest- no evidence of pneumothorax. Minimal left basilar atelectasis. Electronically Signed   By: Harmon Pier M.D.   On: 15-Dec-2015 09:49   Dg Chest Port 1 View  11/24/2015  CLINICAL DATA:  Followup left pneumothorax.  Emphysema. EXAM: PORTABLE CHEST 1 VIEW COMPARISON:  11/23/2015 FINDINGS: Left-sided chest tube has been removed. No residual pneumothorax visualized. Mild residual infiltrate or atelectasis seen at the left lung base. Severe emphysema again demonstrated. Surgical staples seen in the right lung field with chronic pleural thickening at the right lung base. Heart size normal. IMPRESSION: No evidence of pneumothorax following left chest tube removal. Persistent mild left basilar atelectasis versus infiltrate. Severe emphysema and postop changes in right hemithorax. Electronically Signed   By: Myles Rosenthal M.D.   On: 11/24/2015 14:06   Dg Chest Port 1 View  11/23/2015  CLINICAL DATA:  Shortness of breath. EXAM: PORTABLE CHEST 1 VIEW COMPARISON:  11/22/2015. FINDINGS: Mediastinum and hilar structures normal. Left chest tube in stable position. No  pneumothorax. Mild left base atelectasis and or infiltrate. Bibasilar pleural thickening again noted  consistent with scarring. Surgical sutures noted over the right upper and right lower lung. COPD. Stable cardiomegaly. IMPRESSION: 1.  Left chest tube in stable position.  No pneumothorax. 2. Mild left base atelectasis and or infiltrate . 3. Bilateral pleural parenchymal scarring. Postsurgical changes right lung. COPD. 4. Stable cardiomegaly.  No pulmonary venous congestion. Electronically Signed   By: Maisie Fus  Register   On: 11/23/2015 07:43   Dg Chest Port 1 View  11/22/2015  CLINICAL DATA:  75 year old male with shortness of breath EXAM: PORTABLE CHEST 1 VIEW COMPARISON:  Radiograph dated 11/22/2015 FINDINGS: Single-view of chest demonstrates emphysematous changes of the lungs. Left-sided chest tube appears in stable positioning. No pneumothorax identified. Left lung base densities similar to prior study and likely atelectasis in nature. Postsurgical changes of the right lung with scarring. There is blunting of the right costophrenic angle which may postsurgical. A small right pleural effusion is not excluded. The cardiac silhouette is within normal limits with no acute osseous pathology. IMPRESSION: No interval change.  No pneumothorax. Electronically Signed   By: Elgie Collard M.D.   On: 11/22/2015 23:23   Dg Chest Port 1 View  11/22/2015  CLINICAL DATA:  Shortness of breath this morning, improving. Left chest tightness. History of hypertension, a ablation for atrial flutter 5 years ago. History of COPD requiring home O2. EXAM: PORTABLE CHEST 1 VIEW COMPARISON:  Chest x-rays dated 11/21/2015, 11/20/2015 and 05/03/2015. FINDINGS: Left-sided chest tube stable in position. No pneumothorax seen. Persistent streaky opacities at the left lung base, compatible with atelectasis. Stable scarring within the right lung. Additional stable postsurgical changes within the right lung. Heart size is upper normal,  stable. Overall cardiomediastinal silhouette is stable in size and configuration. Osseous structures about the chest are unremarkable. IMPRESSION: 1. Stable position of the left-sided chest tube. No pneumothorax seen. 2. Persistent opacity at the left lung base, most suggestive of atelectasis. Stable scarring and postsurgical change within the right lung. No new lung findings. Electronically Signed   By: Bary Richard M.D.   On: 11/22/2015 12:12   Dg Chest Port 1 View  11/21/2015  CLINICAL DATA:  New onset of shortness of breath. Patient has a left chest tube. EXAM: PORTABLE CHEST 1 VIEW COMPARISON:  11/20/2015 FINDINGS: Stable position of the left chest tube. No evidence for a left pneumothorax. Left basilar densities are suggestive for atelectasis. Stable blunting and probable scarring at the right costophrenic angle. Again noted are postsurgical changes in the right lung. Heart size is within normal limits and stable. The trachea remains midline. IMPRESSION: Stable position of the left chest tube without a pneumothorax. Left basilar atelectasis. Electronically Signed   By: Richarda Overlie M.D.   On: 11/21/2015 07:35   Dg Chest Portable 1 View  11/20/2015  CLINICAL DATA:  Chest tube insertion. EXAM: PORTABLE CHEST 1 VIEW COMPARISON:  Earlier today FINDINGS: New left chest tube with tip over the mid to upper chest. Left pneumothorax is decreased, now trace. There is now chest wall gas on the left. No mediastinal widening. No pulmonary edema or consolidation. Stable heart size. Emphysema with postsurgical changes on the right. IMPRESSION: Nearly resolved left pneumothorax after chest tube insertion. Electronically Signed   By: Marnee Spring M.D.   On: 11/20/2015 16:24   Dg Chest Portable 1 View  11/20/2015  CLINICAL DATA:  Severe respiratory distress. EXAM: PORTABLE CHEST 1 VIEW COMPARISON:  08/06/2015 FINDINGS: Advanced emphysema. There is postsurgical changes on the right with chain sutures and scarring  along the right diaphragm. Remote right rib fractures versus thoracotomy changes. Left pneumothorax estimated at 20%, greatest at the base where there is trace pleural fluid. No edema or pneumonia. Normal heart size and mediastinal contours. Critical Value/emergent results were called by telephone at the time of interpretation on 11/20/2015 at 3:04 pm to Dr. Marily Memos , who verbally acknowledged these results. IMPRESSION: 1. Left pneumothorax estimated at 20%. 2. Severe emphysema. Electronically Signed   By: Marnee Spring M.D.   On: 11/20/2015 15:04    Medical Consultants:    [facility]  Anti-Infectives:   Azithromycin 11/20/15--->11/27/15   Subjective:   Mark [name] continues to report some pain in chest at prior chest tube site.  Says he feels less dyspneic than yesterday but has been sleeping a lot and is more lethargic per wife. Appetite fair.    Objective:    Filed Vitals:   11/26/15 2047 11/26/15 2236 11/27/15 0527 11/27/15 0803  BP:  129/71 144/65   Pulse: 72 72 63   Temp:  97.7 F (36.5 C)    TempSrc:  Oral    Resp: 20 20    Height:      Weight:   74.753 kg (164 lb 12.8 oz)   SpO2: 94% 96% 97% 96%    Intake/Output Summary (Last 24 hours) at 11/27/15 0815 Last data filed at 11/27/15 0600  Gross per 24 hour  Intake   1040 ml  Output   1100 ml  Net    -60 ml   Filed Weights   11/24/15 0704 11/25/15 0635 11/27/15 0527  Weight: 75.932 kg (167 lb 6.4 oz) 75.116 kg (165 lb 9.6 oz) 74.753 kg (164 lb 12.8 oz)    Exam: Gen:  Lethargic Cardiovascular:  RRR, No M/R/G Respiratory:  Lungs diminished at the bases, Occasional crackles Gastrointestinal:  Abdomen soft, NT/ND, + BS Extremities:  No C/E/C   Data Reviewed:    Labs: Basic Metabolic Panel:  Recent Labs Lab 11/20/15 1440 11/21/15 0358 11/22/15 0300 11/22/15 1627 11/26/15 0549  NA 141 140 138  --  140  K 4.6 4.6 4.5  --  4.7  CL 101 98* 97*  --  99*  CO2 28 27 31   --  32  GLUCOSE 250* 166* 162*  --   145*  BUN 13 17 22*  --  31*  CREATININE 1.01 1.18 0.85  --  0.97  CALCIUM 9.0 9.1 9.0  --  9.3  MG  --   --   --  2.6* 2.3   GFR Estimated Creatinine Clearance: 69.6 mL/min (by C-G formula based on Cr of 0.97). Liver Function Tests:  Recent Labs Lab 11/20/15 1440  AST 24  ALT 22  ALKPHOS 56  BILITOT 0.6  PROT 7.7  ALBUMIN 3.7   CBC:  Recent Labs Lab 11/20/15 1440  11/22/15 0300 11/23/15 0518 11/24/15 0334 11/25/15 0405 11/26/15 0549  WBC 18.6*  < > 17.2* 16.0* 10.3 12.3* 14.0*  NEUTROABS 11.2*  --   --   --   --   --   --   HGB 13.0  < > 11.3* 11.9* 11.3* 11.4* 11.9*  HCT 43.2  < > 35.8* 38.8* 37.2* 37.7* 39.4  MCV 101.6*  < > 97.8 99.2 98.4 96.7 98.3  PLT 305  < > 220 235 227 225 225  < > = values in this interval not displayed. Cardiac Enzymes:  Recent Labs Lab 11/20/15 1816  TROPONINI 0.06*   CBG:  Recent  Labs Lab 11/25/15 2154 11/26/15 0659 11/26/15 1820 11/26/15 2327 11/27/15 0620  GLUCAP 146* 120* 176* 142* 149*   Sepsis Labs:  Recent Labs Lab 11/21/15 1325 11/22/15 0300 11/23/15 0518 11/24/15 0334 11/25/15 0405 11/26/15 0549  PROCALCITON 0.50 0.36 0.26  --   --   --   WBC  --  17.2* 16.0* 10.3 12.3* 14.0*   Microbiology Recent Results (from the past 240 hour(s))  MRSA PCR Screening     Status: None   Collection Time: 11/21/15 12:05 AM  Result Value Ref Range Status   MRSA by PCR NEGATIVE NEGATIVE Final    Comment:        The GeneXpert MRSA Assay (FDA approved for NASAL specimens only), is one component of a comprehensive MRSA colonization surveillance program. It is not intended to diagnose MRSA infection nor to guide or monitor treatment for MRSA infections.      Medications:   . alfuzosin  10 mg Oral Daily  . apixaban  5 mg Oral BID  . atorvastatin  20 mg Oral Daily  . azithromycin  500 mg Oral Daily  . budesonide (PULMICORT) nebulizer solution  0.25 mg Nebulization BID  . calcium-vitamin D  1 tablet Oral Q  breakfast  . diltiazem  60 mg Oral 4 times per day  . furosemide  40 mg Oral Daily  . insulin aspart  0-5 Units Subcutaneous QHS  . insulin aspart  0-9 Units Subcutaneous TID WC  . ipratropium  0.5 mg Nebulization TID  . levalbuterol  0.63 mg Nebulization TID  . methylPREDNISolone (SOLU-MEDROL) injection  40 mg Intravenous Q24H  . mirabegron ER  50 mg Oral Daily  . pantoprazole  40 mg Oral Daily  . propafenone  225 mg Oral BID  . sodium chloride flush  3 mL Intravenous Q12H   Continuous Infusions:    Time spent: 25 minutes.   LOS: 7 days   RAMA,CHRISTINA  [facility] Pager [phone]. If unable to reach me by pager, please call my cell phone at [phone].  *Please refer to [email or web address], password [email or web address] to get updated schedule on who will round on this patient, as hospitalists switch teams weekly. If 7PM-7AM, please contact night-coverage at [email or web address], password [email or web address] for any overnight needs.  11/27/2015, 8:15 AM

## 2015-11-28 ENCOUNTER — Telehealth: Payer: Self-pay | Admitting: Cardiovascular Disease

## 2015-11-28 LAB — CBC
HEMATOCRIT: 40.1 % (ref 39.0–52.0)
HEMOGLOBIN: 12.5 g/dL — AB (ref 13.0–17.0)
MCH: 30.1 pg (ref 26.0–34.0)
MCHC: 31.2 g/dL (ref 30.0–36.0)
MCV: 96.6 fL (ref 78.0–100.0)
Platelets: 241 10*3/uL (ref 150–400)
RBC: 4.15 MIL/uL — ABNORMAL LOW (ref 4.22–5.81)
RDW: 14.3 % (ref 11.5–15.5)
WBC: 18.8 10*3/uL — AB (ref 4.0–10.5)

## 2015-11-28 LAB — GLUCOSE, CAPILLARY
GLUCOSE-CAPILLARY: 199 mg/dL — AB (ref 65–99)
GLUCOSE-CAPILLARY: 106 mg/dL — AB (ref 65–99)

## 2015-11-28 LAB — BASIC METABOLIC PANEL WITH GFR
Anion gap: 14 (ref 5–15)
BUN: 37 mg/dL — ABNORMAL HIGH (ref 6–20)
CO2: 34 mmol/L — ABNORMAL HIGH (ref 22–32)
Calcium: 9.5 mg/dL (ref 8.9–10.3)
Chloride: 91 mmol/L — ABNORMAL LOW (ref 101–111)
Creatinine, Ser: 0.94 mg/dL (ref 0.61–1.24)
GFR calc Af Amer: >60 mL/min
GFR calc non Af Amer: >60 mL/min
Glucose, Bld: 148 mg/dL — ABNORMAL HIGH (ref 65–99)
Potassium: 4.4 mmol/L (ref 3.5–5.1)
Sodium: 139 mmol/L (ref 135–145)

## 2015-11-28 LAB — BRAIN NATRIURETIC PEPTIDE: B Natriuretic Peptide: 28.9 pg/mL (ref 0.0–100.0)

## 2015-11-28 MED ORDER — DILTIAZEM HCL ER COATED BEADS 240 MG PO CP24
240.0000 mg | ORAL_CAPSULE | Freq: Every day | ORAL | Status: DC
  Administered 2015-11-28: 240 mg via ORAL
  Filled 2015-11-28: qty 1

## 2015-11-28 MED ORDER — PROMETHAZINE-CODEINE 6.25-10 MG/5ML PO SYRP: ORAL_SOLUTION | ORAL | Status: AC

## 2015-11-28 MED ORDER — ALPRAZOLAM 0.25 MG PO TABS: 0.2500 mg | ORAL_TABLET | Freq: Two times a day (BID) | ORAL | Status: DC | PRN

## 2015-11-28 MED ORDER — DILTIAZEM HCL ER COATED BEADS 240 MG PO TB24: 240.0000 mg | ORAL_TABLET | Freq: Every day | ORAL | Status: DC

## 2015-11-28 MED ORDER — HYDROCODONE-ACETAMINOPHEN 5-325 MG PO TABS: 1.0000 | ORAL_TABLET | Freq: Four times a day (QID) | ORAL | Status: DC | PRN

## 2015-11-28 MED ORDER — POLYETHYLENE GLYCOL 3350 17 G PO PACK: 17.0000 g | PACK | Freq: Every day | ORAL | Status: AC | PRN

## 2015-11-28 MED ORDER — FUROSEMIDE 20 MG PO TABS: 20.0000 mg | ORAL_TABLET | Freq: Every day | ORAL | Status: DC

## 2015-11-28 MED ORDER — PREDNISONE 50 MG PO TABS: 60.0000 mg | ORAL_TABLET | Freq: Every day | ORAL | Status: DC

## 2015-11-28 NOTE — Progress Notes (Signed)
Physical Therapy Treatment Patient Details Name: Mark [name] MRN: [id number] DOB: 01-Nov-1940 Today's Date: 11/28/2015    History of Present Illness 75 year old male with history of COPD on 4 L O2 via nasal cannula, chronic prednisone, A. fib on Eliquis, history of CVA history of spontaneous right-sided pneumothorax 3 years back requiring thoracotomy and chest tube, remote history of smoking, chronic diastolic CHF, stroke, GERD, presented with feeling sick and increasing lethargy for almost 8-10 weeks. He also had productive cough with wheezing and increasing shortness of breath for 1 week presented to the hospital with progressive dyspnea and left-sided chest pain.    PT Comments    Patient seen for mobility progression. Ambulated in hall initially on 4 liters with desaturation and increased WOBing. Increased to 6 liters and cued for breath support, pursed lip breathing and nasal inhalation. Increased time during seated rest break required to improve rebounding saturations. Patient educated on need for monitoring of HR with O2 and increased receptivity to how he is feeling with mobility.    Follow Up Recommendations  Home health PT     Equipment Recommendations  None recommended by PT    Recommendations for Other Services       Precautions / Restrictions Precautions Precautions: Fall Restrictions Weight Bearing Restrictions: No    Mobility  Bed Mobility               General bed mobility comments: received sitting EOB, assisted to [facility] priot to ambulation  Transfers Overall transfer level: Needs assistance Equipment used: Rolling walker (2 wheeled);None Transfers: Sit to/from Stand Sit to Stand: Min assist (during transfer to [facility] without device, min guard from [facility] wi)         General transfer comment: cues for hand position when standing.   Ambulation/Gait Ambulation/Gait assistance: Min guard Ambulation Distance (Feet): 110 Feet Assistive device: Rolling walker  (2 wheeled) Gait Pattern/deviations: Step-through pattern Gait velocity: decreased.    General Gait Details: patient slow gait with increased effort. ambulated on 4 liters with saturations dropping to low 80s with elevated HR 130s with increased distance, standin rest break and one long extended seated rest break with O2 increased to 6 liters. Cued for pursed lip breathing performance and nasal inhalation. Patient noted to be truncated in his accessory breathing. Increased time to rebound to above 90%.   Stairs            Wheelchair Mobility    Modified Rankin (Stroke Patients Only)       Balance   Sitting-balance support: No upper extremity supported Sitting balance-Leahy Scale: Good       Standing balance-Leahy Scale: Poor Standing balance comment: reliance on RW                    Cognition Arousal/Alertness: Awake/alert Behavior During Therapy: WFL for tasks assessed/performed Overall Cognitive Status: Within Functional Limits for tasks assessed                      Exercises      General Comments        Pertinent Vitals/Pain Pain Assessment: Faces Faces Pain Scale: Hurts a little bit Pain Location: chest  Pain Intervention(s): Monitored during session    Home Living                      Prior Function            PT Goals (current goals can now be  found in the care plan section) Acute Rehab PT Goals Patient Stated Goal: to go home PT Goal Formulation: With patient Time For Goal Achievement: 12/01/15 Potential to Achieve Goals: Fair Progress towards PT goals: Progressing toward goals    Frequency  Min 3X/week    PT Plan Current plan remains appropriate    Co-evaluation             End of Session Equipment Utilized During Treatment: Oxygen;Gait belt Activity Tolerance: Patient limited by fatigue Patient left: in bed;with family/visitor present (sitting EOB)     Time: 1191-47821225-1251 PT Time Calculation (min)  (ACUTE ONLY): 26 min  Charges:  $Gait Training: 8-22 mins $Therapeutic Activity: 8-22 mins                    G CodesFabio Asa:      Timmy Cleverly J 11/28/2015, 1:31 PM Charlotte Crumbevon Kellis Topete, PT DPT  [phone]

## 2015-11-28 NOTE — Telephone Encounter (Signed)
7 dayTOC pt- appt 12-05-15 with Wynema BirchHao

## 2015-11-28 NOTE — Progress Notes (Signed)
At 1558 pt & his wife received all d/c instructions.  Verbalized understanding.  D/c off floor via w/c to awaiting transport by assigned Cari, NT.  Amanda PeaNellie Ej Pinson, Charity fundraiserN.

## 2015-11-28 NOTE — Discharge Summary (Signed)
Physician Discharge Summary  Mark [name] ZOX:[id number] DOB: 03/15/1941 DOA: 11/20/2015  PCP: [name], MD  Admit date: 11/20/2015 Discharge date: 11/28/2015   Recommendations for Outpatient Follow-Up:   1. Home health RN, PT and aide set up. 2. PCP: Please check glycemic control given chronic steroid use.   Discharge Diagnosis:   Principal Problem:    Spontaneous pneumothorax Active Problems:    HLD (hyperlipidemia)    PAF (paroxysmal atrial fibrillation) (HCC)    COPD with chronic bronchitis-oxygen dependent    S/P thoracotomy    History of CVA (cerebrovascular accident) 5/16    Essential hypertension    Pneumothorax    Acute respiratory failure with hypoxia (HCC)    Atrial fibrillation with RVR [facility])   Discharge disposition:  Home with home health nursing services.    Discharge Condition: Improved.  Diet recommendation: Low sodium, heart healthy.     History of Present Illness:   Mark [name] is an 75 y.o. male with history of COPD on 4 L O2 via nasal cannula, chronic prednisone, A. fib on Eliquis, history of CVA history of spontaneous right-sided pneumothorax 3 years back requiring thoracotomy and chest tube, remote history of smoking, chronic diastolic CHF, stroke, GERD, presented with feeling sick and increasing lethargy for almost 8-10 weeks. He also had productive cough with wheezing and increasing shortness of breath for 1 week presented to the hospital with progressive dyspnea and left-sided chest pain. In the ED he was found to have left-sided spontaneous pneumothorax. Status post chest tube placement on admission, removed 11/24/15.  Hospital Course by Problem:   Principle problems: Spontaneous left pneumothorax Status post chest tube placement. Chest tube removed 11/24/15. Follow-up chest x-ray negative for pneumothorax. Pain control with Vicodin, and Toradol when necessary.  Acute on chronic hypoxic respiratory failure secondary to COPD  exacerbation Treated with O2 via nasal cannula, IV Solu-Medrol, Pulmicort, when necessary albuterol neb and empiric azithromycin. Supportive care with Tylenol and antitussives. Became short of breath overnight 11/23/15 with follow-up chest x-ray unremarkable. Xanax added for anxiety. Lasix resumed 11/26/15. Repeat chest x-ray 11/27/15 was stable. I/O balance -1.8 L with resumption of Lasix. Discharge home on prednisone taper (instructed to take 40 mg daily 3 days, then reduce dose to home maintenance dose of 20 mg daily).  A. fib with RVR/PVCs Verapamil discontinued and Cardizem used for rate control, which we will continue at discharge. Continue Rythmol. Continue Eliquis.   Active Problems: Lethargy Improved with reduction of dosages of hydrocodone and Xanax.  History of CVA Continue Lipitor  GERD Continue PPI.  Steroid-induced hyperglycemia Treated with insulin sensitive SSI while in the hospital. Recommend close follow-up with PCP to ensure glycemic control.  Mild acute on chronic diastolic CHF EF of 50%. Lasix resumed 11/26/15.  Leukocytosis Possibly stress-induced versus chronic steroid use. No signs of acute infection.   Hypermagnesemia Magnesium supplementation discontinued. Magnesium now WNL. Patient counseled to avoid taking supplemental magnesium.   Medical Consultants:    CVTS  Cardiology   Discharge Exam:   Filed Vitals:   11/28/15 0458 11/28/15 1050  BP: 119/60 115/54  Pulse: 77 68  Temp: 97.9 F (36.6 C)   Resp: 18    Filed Vitals:   11/28/15 0458 11/28/15 0837 11/28/15 1050 11/28/15 1339  BP: 119/60  115/54   Pulse: 77  68   Temp: 97.9 F (36.6 C)     TempSrc: Oral     Resp: 18     Height:  Weight: 73.211 kg (161 lb 6.4 oz)     SpO2: 94% 96%  94%    Gen:  More alert today Cardiovascular:  RRR, No M/R/G Respiratory: Lungs diminished Gastrointestinal: Abdomen soft, NT/ND with normal active bowel sounds. Extremities: No C/E/C   The results  of significant diagnostics from this hospitalization (including imaging, microbiology, ancillary and laboratory) are listed below for reference.     Procedures and Diagnostic Studies:   Dg Chest Port 1 View  12-13-15  CLINICAL DATA:  New onset of shortness of breath. Patient has a left chest tube. EXAM: PORTABLE CHEST 1 VIEW COMPARISON:  11/20/2015 FINDINGS: Stable position of the left chest tube. No evidence for a left pneumothorax. Left basilar densities are suggestive for atelectasis. Stable blunting and probable scarring at the right costophrenic angle. Again noted are postsurgical changes in the right lung. Heart size is within normal limits and stable. The trachea remains midline. IMPRESSION: Stable position of the left chest tube without a pneumothorax. Left basilar atelectasis. Electronically Signed   By: Richarda Overlie M.D.   On: 12/13/2015 07:35   Dg Chest Portable 1 View  11/20/2015  CLINICAL DATA:  Chest tube insertion. EXAM: PORTABLE CHEST 1 VIEW COMPARISON:  Earlier today FINDINGS: New left chest tube with tip over the mid to upper chest. Left pneumothorax is decreased, now trace. There is now chest wall gas on the left. No mediastinal widening. No pulmonary edema or consolidation. Stable heart size. Emphysema with postsurgical changes on the right. IMPRESSION: Nearly resolved left pneumothorax after chest tube insertion. Electronically Signed   By: Marnee Spring M.D.   On: 11/20/2015 16:24   Dg Chest Portable 1 View  11/20/2015  CLINICAL DATA:  Severe respiratory distress. EXAM: PORTABLE CHEST 1 VIEW COMPARISON:  08/06/2015 FINDINGS: Advanced emphysema. There is postsurgical changes on the right with chain sutures and scarring along the right diaphragm. Remote right rib fractures versus thoracotomy changes. Left pneumothorax estimated at 20%, greatest at the base where there is trace pleural fluid. No edema or pneumonia. Normal heart size and mediastinal contours. Critical Value/emergent  results were called by telephone at the time of interpretation on 11/20/2015 at 3:04 pm to Dr. Marily Memos , who verbally acknowledged these results. IMPRESSION: 1. Left pneumothorax estimated at 20%. 2. Severe emphysema. Electronically Signed   By: Marnee Spring M.D.   On: 11/20/2015 15:04     Labs:   Basic Metabolic Panel:  Recent Labs Lab 11/22/15 0300 11/22/15 1627 11/26/15 0549 11/28/15 0320  NA 138  --  140 139  K 4.5  --  4.7 4.4  CL 97*  --  99* 91*  CO2 31  --  32 34*  GLUCOSE 162*  --  145* 148*  BUN 22*  --  31* 37*  CREATININE 0.85  --  0.97 0.94  CALCIUM 9.0  --  9.3 9.5  MG  --  2.6* 2.3  --    GFR Estimated Creatinine Clearance: 70.3 mL/min (by C-G formula based on Cr of 0.94). Liver Function Tests: No results for input(s): AST, ALT, ALKPHOS, BILITOT, PROT, ALBUMIN in the last 168 hours. No results for input(s): LIPASE, AMYLASE in the last 168 hours. No results for input(s): AMMONIA in the last 168 hours. Coagulation profile No results for input(s): INR, PROTIME in the last 168 hours.  CBC:  Recent Labs Lab 11/23/15 0518 11/24/15 0334 11/25/15 0405 11/26/15 0549 11/28/15 0320  WBC 16.0* 10.3 12.3* 14.0* 18.8*  HGB 11.9* 11.3* 11.4*  11.9* 12.5*  HCT 38.8* 37.2* 37.7* 39.4 40.1  MCV 99.2 98.4 96.7 98.3 96.6  PLT 235 227 225 225 241   CBG:  Recent Labs Lab 11/27/15 1202 11/27/15 1628 11/27/15 2237 11/28/15 0642 11/28/15 1207  GLUCAP 147* 251* 142* 199* 106*   Microbiology Recent Results (from the past 240 hour(s))  MRSA PCR Screening     Status: None   Collection Time: 11/21/15 12:05 AM  Result Value Ref Range Status   MRSA by PCR NEGATIVE NEGATIVE Final    Comment:        The GeneXpert MRSA Assay (FDA approved for NASAL specimens only), is one component of a comprehensive MRSA colonization surveillance program. It is not intended to diagnose MRSA infection nor to guide or monitor treatment for MRSA infections.       Discharge Instructions:   Discharge Instructions    Call MD for:  difficulty breathing, headache or visual disturbances    Complete by:  As directed      Call MD for:  extreme fatigue    Complete by:  As directed      Call MD for:  persistant dizziness or light-headedness    Complete by:  As directed      Call MD for:  temperature >100.4    Complete by:  As directed      Diet - low sodium heart healthy    Complete by:  As directed      Discharge instructions    Complete by:  As directed   Take Prednisone 40 mg for 3 days, then go back to 20 mg a day.     Face-to-face encounter (required for Medicare/Medicaid patients)    Complete by:  As directed   I RAMA,CHRISTINA certify that this patient is under my care and that I, or a nurse practitioner or physician's assistant working with me, had a face-to-face encounter that meets the physician face-to-face encounter requirements with this patient on 11/28/2015. The encounter with the patient was in whole, or in part for the following medical condition(s) which is the primary reason for home health care (List medical condition): Very debilitated.  High risk for re-hospitalization.  Needs RN for skilled assessment.  PT/Aide needed.  The encounter with the patient was in whole, or in part, for the following medical condition, which is the primary reason for home health care:  Deconditioning post hospitalization  I certify that, based on my findings, the following services are medically necessary home health services:   Nursing Physical therapy    Reason for Medically Necessary Home Health Services:   Skilled Nursing- Teaching of Disease Process/Symptom Management Skilled Nursing- Skilled Assessment/Observation Skilled Nursing- Changes in Medication/Medication Management    My clinical findings support the need for the above services:  Shortness of breath with activity  Further, I certify that my clinical findings support that this patient is  homebound due to:  Shortness of Breath with activity     For home use only DME oxygen    Complete by:  As directed   Mode or (Route):  Nasal cannula  Liters per Minute:  4  Frequency:  Continuous (stationary and portable oxygen unit needed)  Oxygen conserving device:  Yes  Oxygen delivery system:  Gas     Home Health    Complete by:  As directed   To provide the following care/treatments:   PT Home Health Aide RN       Increase activity slowly    Complete  by:  As directed      Walker     Complete by:  As directed             Medication List    STOP taking these medications        magnesium oxide 400 MG tablet  Commonly known as:  MAG-OX     verapamil 240 MG CR tablet  Commonly known as:  CALAN-SR      TAKE these medications        acetaminophen 325 MG tablet  Commonly known as:  TYLENOL  Take 650 mg by mouth every 6 (six) hours as needed for mild pain.     albuterol (2.5 MG/3ML) 0.083% nebulizer solution  Commonly known as:  PROVENTIL  USE ONE VIAL VIA NEBULIZER 4 TIMES DAILY     albuterol 108 (90 Base) MCG/ACT inhaler  Commonly known as:  PROAIR HFA  Inhale 2 puffs into the lungs every 4 (four) hours as needed for wheezing or shortness of breath. For shortness of breath/wheezing     alendronate 70 MG tablet  Commonly known as:  FOSAMAX  Take 70 mg by mouth every 7 (seven) days. Takes on Tuesday Take with a full glass of water on an empty stomach.     alfuzosin 10 MG 24 hr tablet  Commonly known as:  UROXATRAL  Take 1 tablet by mouth daily.     ALPRAZolam 0.25 MG tablet  Commonly known as:  XANAX  Take 1 tablet (0.25 mg total) by mouth 2 (two) times daily as needed for anxiety or sleep.     apixaban 5 MG Tabs tablet  Commonly known as:  ELIQUIS  Take 1 tablet (5 mg total) by mouth 2 (two) times daily.     atorvastatin 20 MG tablet  Commonly known as:  LIPITOR  Take 1 tablet (20 mg total) by mouth daily.     CENTRUM SILVER PO  Take 1 tablet by mouth  daily.     Compressor/Nebulizer Misc  Use as directed     diltiazem 240 MG 24 hr tablet  Commonly known as:  CARDIZEM LA  Take 1 tablet (240 mg total) by mouth daily.     EPIPEN 2-PAK 0.3 mg/0.3 mL Soaj injection  Generic drug:  EPINEPHrine  INJECT 0.3 MLS (0.3 MG TOTAL) INTO THE MUSCLE ONCE. FOR SEVERE SHORTNESS OF BREATH     furosemide 20 MG tablet  Commonly known as:  LASIX  Take 1 tablet (20 mg total) by mouth daily.     guaiFENesin 600 MG 12 hr tablet  Commonly known as:  MUCINEX  Take 1-2 mg by mouth every 12 (twelve) hours as needed. For cough     HYDROcodone-acetaminophen 5-325 MG tablet  Commonly known as:  NORCO/VICODIN  Take 1 tablet by mouth every 6 (six) hours as needed for moderate pain.     MYRBETRIQ 50 MG Tb24 tablet  Generic drug:  mirabegron ER  Take 1 tablet by mouth daily.     omeprazole 20 MG capsule  Commonly known as:  PRILOSEC  Take 1 capsule (20 mg total) by mouth 2 (two) times daily.     ONE-A-DAY CALCIUM PLUS 500-50-100 MG-MG-UNIT Chew  Generic drug:  Calcium-Magnesium-Vitamin D  Chew 1 tablet by mouth 2 (two) times daily.     polyethylene glycol packet  Commonly known as:  MIRALAX / GLYCOLAX  Take 17 g by mouth daily as needed for mild constipation.     predniSONE 20 MG  tablet  Commonly known as:  DELTASONE  TAKE 1 TABLET (20 MG TOTAL) BY MOUTH DAILY.     promethazine-codeine 6.25-10 MG/5ML syrup  Commonly known as:  PHENERGAN with CODEINE  TAKE 5 ML EVERY 6 HOURS AS NEEDED FOR COUGH     propafenone 225 MG tablet  Commonly known as:  RYTHMOL  TAKE ONE TABLET BY MOUTH TWICE A DAY     umeclidinium-vilanterol 62.5-25 MCG/INH Aepb  Commonly known as:  ANORO ELLIPTA  Inhale 1 puff into the lungs daily. RINSE MOUTH WELL AFTER USE           Follow-up Information    Follow up with Alleen Borne, MD On 12/05/2015.   Specialty:  Cardiothoracic Surgery   Why:  PA/LAT CXR to be taken (at [facility] which is in the same building  as Dr. Sharee Pimple office) on 12/05/2015 at 2:15 pm ;Appointment time is at 3:00 pm   Contact information:   [address] [employer] [address] [place] [address] [phone]       Follow up with [name], MD On 12/04/2015.   Specialty:  Family Medicine   Why:  At 0930 am    // Hospital follow up and blood work   Contact information:   [address] [place] [address] [phone]       Follow up with Sherryl Manges, MD On 12/05/2015.   Specialty:  Cardiology   Why:  At 0900am   Contact information:   [address] [place] [address] [phone]        Time coordinating discharge: 35 minutes.  Signed:  RAMA,CHRISTINA  Pager [phone] [facility] 11/28/2015, 1:42 PM

## 2015-12-04 NOTE — Progress Notes (Signed)
Cardiology Office Note   Date:  12/05/2015   ID:  Mark [name], DOB 18-Mar-1941, MRN [id number]  PCP:  [name], MD  Cardiologist:  Dr. [name]  Chief Complaint  Patient presents with  . Hospitalization Follow-up    seen for Dr. [name]      History of Present Illness: Mark [name] is a 75 y.o. male who presents for post hospital followup. He has a past medical history of paroxysmal atrial fibrillation, severe COPD, recurrent spontaneous pneumothorax, CVA and hyperlipidemia. He was admitted in December 2016 with possible syncope and fall. He was seen by electrophysiology, given isolated incidence of syncope, a loop recorder was felt not to be necessary. Echocardiogram was obtained on 08/07/2015 shows EF 60-65%, no regional wall motion abnormality. EEG showed no seizure predisposition or activity. He came back to the hospital on 11/12/2015 with increasing shortness of breath for one week. Workup showed left-sided spontaneous pneumothorax. CT surgery was consulted and a chest tube was placed. His chest tube was eventually removed on 11/24/2015. He was also treated for acute on chronic respiratory failure secondary to COPD exacerbation. His hospitalization was complicated by atrial fibrillation with RVR. Cardizem was added to his medical regimen in attempt to control heart rate. Cardiology was not consulted during this admission. He was eventually discharged on 4/5 to follow-up with cardiology as outpatient.  His EKG today shows he is currently in normal sinus rhythm on long-acting diltiazem, Rhythmol and eliquis. Hospital EKG has been reviewed, admission EKG shows sinus tachycardia. I could not locate any evidence of atrial fibrillation during the last hospitalization, review of the hospital telemetry strip shows he is in sinus rhythm most of the time. He may have had short bursts of A. fib with RVR. Otherwise, with current rate control and rhythm control, he is doing quite well. His lung  shows clear breath sounds bilaterally. I do not hear significant rhonchi, rale, or wheezing. It appears his spontaneous pneumothorax has resolved without further issue which is consistent with his last EKG prior to discharge. His O2 saturation is stable on home oxygen, 91% on 3 L home O2, but quickly rise to 98%. He is pending a chest x-ray this afternoon at Dr. Sharee Pimple office. His renal function and hemoglobin has been stable on recent follow-up, I'm not inclined to obtain any further labs as this time. He will follow-up with Dr. [name] in 4 months.    Past Medical History  Diagnosis Date  . Atrial fibrillation (HCC)   . Hyperlipidemia   . Headache(784.0)   . Osteopenia   . COPD (chronic obstructive pulmonary disease) (HCC)   . Allergic rhinitis   . History of echocardiogram     Echo 5/16:  EF 55-60%, no RWMA, Gr 1 DD  . Incontinence of urine     and stool  . Memory loss   . CVA (cerebral infarction)   . Shortness of breath dyspnea   . GERD (gastroesophageal reflux disease)     Past Surgical History  Procedure Laterality Date  . Catheter ablation    . Cataract extraction, bilateral    . Appendectomy  1958  . Inguinal hernia repair  1975, 1992  . Video assisted thoracoscopy (vats)/thorocotomy  08/17/2012    resection / stapling of blebs, mechanical pleurodesis   . Video assisted thoracoscopy  08/17/2012    Procedure: VIDEO ASSISTED THORACOSCOPY;  Surgeon: [name], MD;  Location: [facility] OR;  Service: Thoracic;  Laterality: Right;  . Resection of  apical bleb  08/17/2012    Procedure: RESECTION OF APICAL BLEB;  Surgeon: Kerin PernaPeter Van Trigt, MD;  Location: [facility] OR;  Service: Thoracic;  Laterality: Right;  stapling of bleb  . Video bronchoscopy  08/27/2012    Procedure: VIDEO BRONCHOSCOPY;  Surgeon: Delight OvensEdward B Gerhardt, MD;  [facility] OR;  Service: Thoracic;  Laterality: N/A;  evaluation of endobronchial valves     Current Outpatient Prescriptions  Medication Sig Dispense Refill  .  acetaminophen (TYLENOL) 325 MG tablet Take 650 mg by mouth every 6 (six) hours as needed for mild pain.     Marland Kitchen. albuterol (PROAIR HFA) 108 (90 BASE) MCG/ACT inhaler Inhale 2 puffs into the lungs every 4 (four) hours as needed for wheezing or shortness of breath. For shortness of breath/wheezing 1 Inhaler prn  . albuterol (PROVENTIL) (2.5 MG/3ML) 0.083% nebulizer solution USE ONE VIAL VIA NEBULIZER 4 TIMES DAILY (Patient taking differently: USE ONE VIAL VIA NEBULIZER 4 TIMES DAILY as needed for wheezing or shortness of breath) 120 mL 4  . alendronate (FOSAMAX) 70 MG tablet Take 70 mg by mouth every 7 (seven) days. Takes on Tuesday Take with a full glass of water on an empty stomach.    Marland Kitchen. alfuzosin (UROXATRAL) 10 MG 24 hr tablet Take 1 tablet by mouth daily.  11  . ALPRAZolam (XANAX) 0.25 MG tablet Take 1 tablet (0.25 mg total) by mouth 2 (two) times daily as needed for anxiety or sleep. 60 tablet 0  . apixaban (ELIQUIS) 5 MG TABS tablet Take 1 tablet (5 mg total) by mouth 2 (two) times daily. 60 tablet 11  . atorvastatin (LIPITOR) 20 MG tablet Take 1 tablet (20 mg total) by mouth daily. 90 tablet 3  . Calcium-Magnesium-Vitamin D (ONE-A-DAY CALCIUM PLUS) 500-50-100 MG-MG-UNIT CHEW Chew 1 tablet by mouth 2 (two) times daily.      Marland Kitchen. diltiazem (CARDIZEM LA) 240 MG 24 hr tablet Take 1 tablet (240 mg total) by mouth daily. 30 tablet 3  . EPIPEN 2-PAK 0.3 MG/0.3ML SOAJ injection INJECT 0.3 MLS (0.3 MG TOTAL) INTO THE MUSCLE ONCE. FOR SEVERE SHORTNESS OF BREATH 1 Device 1  . furosemide (LASIX) 20 MG tablet Take 1 tablet (20 mg total) by mouth daily. 15 tablet 11  . guaiFENesin (MUCINEX) 600 MG 12 hr tablet Take 1-2 mg by mouth every 12 (twelve) hours as needed. For cough    . HYDROcodone-acetaminophen (NORCO/VICODIN) 5-325 MG tablet Take 1 tablet by mouth every 6 (six) hours as needed for moderate pain. 30 tablet 0  . Multiple Vitamins-Minerals (CENTRUM SILVER PO) Take 1 tablet by mouth daily.      Marland Kitchen. MYRBETRIQ  50 MG TB24 tablet Take 1 tablet by mouth daily.    . Nebulizers (COMPRESSOR/NEBULIZER) MISC Use as directed 1 each 0  . omeprazole (PRILOSEC) 20 MG capsule Take 1 capsule (20 mg total) by mouth 2 (two) times daily. 60 capsule 1  . polyethylene glycol (MIRALAX / GLYCOLAX) packet Take 17 g by mouth daily as needed for mild constipation. 14 each 0  . predniSONE (DELTASONE) 20 MG tablet TAKE 1 TABLET (20 MG TOTAL) BY MOUTH DAILY. 30 tablet 5  . promethazine-codeine (PHENERGAN WITH CODEINE) 6.25-10 MG/5ML syrup TAKE 5 ML EVERY 6 HOURS AS NEEDED FOR COUGH 200 mL 0  . propafenone (RYTHMOL) 225 MG tablet TAKE ONE TABLET BY MOUTH TWICE A DAY 180 tablet 3  . Umeclidinium-Vilanterol (ANORO ELLIPTA) 62.5-25 MCG/INH AEPB Inhale 1 puff into the lungs daily. RINSE MOUTH WELL AFTER USE  3 each 3   No current facility-administered medications for this visit.    Allergies:   Shrimp and Zolpidem tartrate    Social History:  The patient  reports that he has quit smoking. His smoking use included Cigarettes. He has a 45 pack-year smoking history. He quit smokeless tobacco use about 50 years ago. He reports that he does not drink alcohol or use illicit drugs.   Family History:  The patient's family history includes COPD in his brother, father, and sister; Heart attack in his father; Hypertension in his brother; Lung cancer in his father; Stroke in his mother; Throat cancer in his brother.    ROS:  Please see the history of present illness.   Otherwise, review of systems are positive for none.   All other systems are reviewed and negative.    PHYSICAL EXAM: VS:  BP 110/60 mmHg  Pulse 75  Ht  (1.803 m)  Wt 169 lb 9.6 oz (76.93 kg)  BMI 23.66 kg/m2  SpO2 91% , BMI Body mass index is 23.66 kg/(m^2). GEN: Well nourished, well developed, in no acute distress HEENT: normal Neck: no JVD, carotid bruits, or masses Cardiac: RRR; no murmurs, rubs, or gallops,no edema  Respiratory:  clear to auscultation  bilaterally, normal work of breathing GI: soft, nontender, nondistended, + BS MS: no deformity or atrophy Skin: warm and dry, no rash Neuro:  Strength and sensation are intact Psych: euthymic mood, full affect   EKG:  EKG is ordered today. The ekg ordered today demonstrates NSR without sign of afib   Recent Labs: 01/23/2015: TSH 1.960 11/20/2015: ALT 22 11/26/2015: Magnesium 2.3 11/28/2015: B Natriuretic Peptide 28.9; BUN 37*; Creatinine, Ser 0.94; Hemoglobin 12.5*; Platelets 241; Potassium 4.4; Sodium 139    Lipid Panel    Component Value Date/Time   CHOL 153 08/07/2015 0410   CHOL 245* 01/23/2015 1028   TRIG 150* 08/07/2015 0410   HDL 55 08/07/2015 0410   HDL 53 01/23/2015 1028   CHOLHDL 2.8 08/07/2015 0410   CHOLHDL 4.6 01/23/2015 1028   VLDL 30 08/07/2015 0410   LDLCALC 68 08/07/2015 0410   LDLCALC 145* 01/23/2015 1028      Wt Readings from Last 3 Encounters:  12/05/15 169 lb 9.6 oz (76.93 kg)  11/28/15 161 lb 6.4 oz (73.211 kg)  08/15/15 171 lb (77.565 kg)      Other studies Reviewed: Additional studies/ records that were reviewed today include:   Echo 08/07/2015 LV EF: 60% - 65%  ------------------------------------------------------------------- Indications: Syncope 780.2.  ------------------------------------------------------------------- History: PMH: Dyspnea. Atrial fibrillation. Chronic obstructive pulmonary disease. PMH: Stroke. Risk factors: Former tobacco use. Hypertension. Dyslipidemia.  ------------------------------------------------------------------- Study Conclusions  - Left ventricle: The cavity size was normal. Wall thickness was  normal. Systolic function was normal. The estimated ejection  fraction was in the range of 60% to 65%. Wall motion was normal;  there were no regional wall motion abnormalities. The study is  not technically sufficient to allow evaluation of LV diastolic  function.   Review of the  above records demonstrates:   He was admitted recently for recurrent pneumothorax, hospital course was complicated by possible A. fib with RVR, he was placed on diltiazem 240 mg daily. His pneumothorax resolved after placing chest tube.   ASSESSMENT AND PLAN:  1. Paroxysmal atrial fibrillation on Rhythmol and Diltiazem  - CHA2DS2-Vasc score 4 (CVA, age), on eliquis  - Currently well controlled on Rythmol and diltiazem. Blood pressure stable 110/60. We'll continue on current dose. Follow-up  with Dr. [name] in 4 months. Lab work from recent hospitalization reviewed, essentially normal renal function and normal hemoglobin. Will not obtain any further labs at this time.  2. Recurrent spontaneous pneumothorax: Has been doing very well since discharge, denies any increase in shortness of breath compared to his baseline. He is usually on 3-4 L home oxygen and has been on oxygen for the past 12 years. He is a former smoker and is currently not smoking. He does have some degree of cough.  3. severe COPD on 3-4 L home O2 for past 12 years, O2 sat stable in the clinic  4. H/o CVA: no recent acute event  5. Hyperlipidemia: on 20mg  lipitor    Current medicines are reviewed at length with the patient today.  The patient does not have concerns regarding medicines.  The following changes have been made:  no change  Labs/ tests ordered today include:   Orders Placed This Encounter  Procedures  . EKG 12-Lead     Disposition:   FU with Dr. [name] in 4 months  Signed, Azalee Course, [place]  12/05/2015 10:27 AM    [facility] [address] [place], [place], [place]  [address] Phone: [phone]; Fax: [phone]

## 2015-12-05 ENCOUNTER — Ambulatory Visit (INDEPENDENT_AMBULATORY_CARE_PROVIDER_SITE_OTHER): Payer: Medicare Other | Admitting: Physician Assistant

## 2015-12-05 ENCOUNTER — Other Ambulatory Visit: Payer: Self-pay | Admitting: *Deleted

## 2015-12-05 ENCOUNTER — Encounter: Payer: Self-pay | Admitting: Physician Assistant

## 2015-12-05 ENCOUNTER — Ambulatory Visit
Admission: RE | Admit: 2015-12-05 | Discharge: 2015-12-05 | Disposition: A | Discharge status: Home / Self Care | Payer: Medicare Other | Source: Ambulatory Visit | Attending: Surgery | Admitting: Surgery

## 2015-12-05 VITALS — BP 110/60 | HR 75 | Ht 71.0 in | Wt 169.6 lb

## 2015-12-05 DIAGNOSIS — I480 Paroxysmal atrial fibrillation: Secondary | ICD-10-CM

## 2015-12-05 DIAGNOSIS — J9383 Other pneumothorax: Secondary | ICD-10-CM

## 2015-12-05 DIAGNOSIS — R0989 Other specified symptoms and signs involving the circulatory and respiratory systems: Secondary | ICD-10-CM | POA: Diagnosis not present

## 2015-12-05 DIAGNOSIS — J939 Pneumothorax, unspecified: Secondary | ICD-10-CM

## 2015-12-05 DIAGNOSIS — Z9889 Other specified postprocedural states: Secondary | ICD-10-CM

## 2015-12-05 DIAGNOSIS — R001 Bradycardia, unspecified: Secondary | ICD-10-CM | POA: Diagnosis not present

## 2015-12-05 NOTE — Patient Instructions (Signed)
Medication Instructions:  Your physician recommends that you continue on your current medications as directed. Please refer to the Current Medication list given to you today.   Labwork: NONE ORDERED  Testing/Procedures: NONE ORDERED  Follow-Up: Your physician recommends that you schedule a follow-up appointment in: 4 MONTHS WITH DR. Graciela HusbandsKLEIN   Any Other Special Instructions Will Be Listed Below (If Applicable).     If you need a refill on your cardiac medications before your next appointment, please call your pharmacy.

## 2015-12-11 ENCOUNTER — Other Ambulatory Visit: Payer: Self-pay | Admitting: Surgery

## 2015-12-11 DIAGNOSIS — J9383 Other pneumothorax: Secondary | ICD-10-CM

## 2015-12-12 ENCOUNTER — Ambulatory Visit (INDEPENDENT_AMBULATORY_CARE_PROVIDER_SITE_OTHER): Payer: Medicare Other | Admitting: Surgery

## 2015-12-12 ENCOUNTER — Encounter: Payer: Self-pay | Admitting: Surgery

## 2015-12-12 VITALS — BP 119/69 | HR 79 | Resp 18 | Ht 71.0 in | Wt 165.0 lb

## 2015-12-12 DIAGNOSIS — J939 Pneumothorax, unspecified: Secondary | ICD-10-CM

## 2015-12-13 ENCOUNTER — Encounter: Payer: Self-pay | Admitting: Surgery

## 2015-12-13 NOTE — Progress Notes (Signed)
HPI: Patient returns for routine  follow-up having undergone insertion of a left chest tube for a spontaneous tension pneumothorax with acute respiratory failure on 11/20/2015. He was admitted to the hospitalist service and CCM was consulted. He has severe oxygen-dependent COPD. The chest tube had a trivial air leak and was removed after a few days. He was treated for a COPD exacerbation and discharged on 4/5. Since hospital discharge the patient reports that his breathing is back to baseline. He is very sedentary on oxygen and short of breath with any activity.   Current Outpatient Prescriptions  Medication Sig Dispense Refill  . acetaminophen (TYLENOL) 325 MG tablet Take 650 mg by mouth every 6 (six) hours as needed for mild pain.     Marland Kitchen. albuterol (PROAIR HFA) 108 (90 BASE) MCG/ACT inhaler Inhale 2 puffs into the lungs every 4 (four) hours as needed for wheezing or shortness of breath. For shortness of breath/wheezing 1 Inhaler prn  . albuterol (PROVENTIL) (2.5 MG/3ML) 0.083% nebulizer solution USE ONE VIAL VIA NEBULIZER 4 TIMES DAILY (Patient taking differently: USE ONE VIAL VIA NEBULIZER 4 TIMES DAILY as needed for wheezing or shortness of breath) 120 mL 4  . alendronate (FOSAMAX) 70 MG tablet Take 70 mg by mouth every 7 (seven) days. Takes on Tuesday Take with a full glass of water on an empty stomach.    Marland Kitchen. alfuzosin (UROXATRAL) 10 MG 24 hr tablet Take 1 tablet by mouth daily.  11  . ALPRAZolam (XANAX) 0.25 MG tablet Take 1 tablet (0.25 mg total) by mouth 2 (two) times daily as needed for anxiety or sleep. 60 tablet 0  . apixaban (ELIQUIS) 5 MG TABS tablet Take 1 tablet (5 mg total) by mouth 2 (two) times daily. 60 tablet 11  . atorvastatin (LIPITOR) 20 MG tablet Take 1 tablet (20 mg total) by mouth daily. 90 tablet 3  . Calcium-Magnesium-Vitamin D (ONE-A-DAY CALCIUM PLUS) 500-50-100 MG-MG-UNIT CHEW Chew 1 tablet by mouth 2 (two) times daily.      Marland Kitchen. diltiazem (CARDIZEM LA) 240 MG 24 hr  tablet Take 1 tablet (240 mg total) by mouth daily. 30 tablet 3  . EPIPEN 2-PAK 0.3 MG/0.3ML SOAJ injection INJECT 0.3 MLS (0.3 MG TOTAL) INTO THE MUSCLE ONCE. FOR SEVERE SHORTNESS OF BREATH 1 Device 1  . furosemide (LASIX) 20 MG tablet Take 1 tablet (20 mg total) by mouth daily. 15 tablet 11  . guaiFENesin (MUCINEX) 600 MG 12 hr tablet Take 1-2 mg by mouth every 12 (twelve) hours as needed. For cough    . HYDROcodone-acetaminophen (NORCO/VICODIN) 5-325 MG tablet Take 1 tablet by mouth every 6 (six) hours as needed for moderate pain. 30 tablet 0  . Multiple Vitamins-Minerals (CENTRUM SILVER PO) Take 1 tablet by mouth daily.      Marland Kitchen. MYRBETRIQ 50 MG TB24 tablet Take 1 tablet by mouth daily.    . Nebulizers (COMPRESSOR/NEBULIZER) MISC Use as directed 1 each 0  . omeprazole (PRILOSEC) 20 MG capsule Take 1 capsule (20 mg total) by mouth 2 (two) times daily. 60 capsule 1  . polyethylene glycol (MIRALAX / GLYCOLAX) packet Take 17 g by mouth daily as needed for mild constipation. 14 each 0  . predniSONE (DELTASONE) 20 MG tablet TAKE 1 TABLET (20 MG TOTAL) BY MOUTH DAILY. 30 tablet 5  . promethazine-codeine (PHENERGAN WITH CODEINE) 6.25-10 MG/5ML syrup TAKE 5 ML EVERY 6 HOURS AS NEEDED FOR COUGH 200 mL 0  . propafenone (RYTHMOL) 225 MG tablet TAKE ONE  TABLET BY MOUTH TWICE A DAY 180 tablet 3  . Umeclidinium-Vilanterol (ANORO ELLIPTA) 62.5-25 MCG/INH AEPB Inhale 1 puff into the lungs daily. RINSE MOUTH WELL AFTER USE 3 each 3   No current facility-administered medications for this visit.    Physical Exam: BP 119/69 mmHg  Pulse 79  Resp 18  Ht  (1.803 m)  Wt 165 lb (74.844 kg)  BMI 23.02 kg/m2  SpO2 98% Elderly, chronically ill-appearing but in no distress. Lungs reveal decreased breath sounds throughout. Chest tube site is well-healed. Suture removed.  Diagnostic Tests:  CLINICAL DATA: Pneumothorax, post thoracotomy and chest tube placement  EXAM: CHEST 2 VIEW  COMPARISON:  11/27/2015  FINDINGS: Normal heart size, mediastinal contours, and pulmonary vascularity.  Emphysematous and bronchitic changes consistent with COPD.  Postsurgical changes and scarring RIGHT lung with blunting and elevation of the RIGHT costophrenic angle.  Minimal atelectasis mid LEFT lung.  No infiltrate, pleural effusion or pneumothorax.  Mild residual LEFT chest wall emphysema inferiorly.  Bones demineralized.  IMPRESSION: COPD changes with scattered scarring and minimal LEFT Mon subsegmental atelectasis.  No pneumothorax identified.   Electronically Signed  By: Ulyses Southward M.D.  On: 12/05/2015 14:50   Impression:  He is stable following chest tube treatment for a spontaneous left tension pneumothorax in the setting of a COPD exacerbation.  Plan:  He will continue follow up with his PCP. If he develops a worsening of his respiratory status he should have another CXR to assess for pneumothorax.   Alleen Borne, MD [facility] [phone]

## 2015-12-14 ENCOUNTER — Ambulatory Visit (INDEPENDENT_AMBULATORY_CARE_PROVIDER_SITE_OTHER): Payer: Medicare Other | Admitting: Neurology

## 2015-12-14 ENCOUNTER — Encounter: Payer: Self-pay | Admitting: Neurology

## 2015-12-14 VITALS — BP 102/59 | HR 75 | Ht 71.0 in | Wt 169.6 lb

## 2015-12-14 DIAGNOSIS — J449 Chronic obstructive pulmonary disease, unspecified: Secondary | ICD-10-CM

## 2015-12-14 DIAGNOSIS — I9589 Other hypotension: Secondary | ICD-10-CM | POA: Diagnosis not present

## 2015-12-14 DIAGNOSIS — J441 Chronic obstructive pulmonary disease with (acute) exacerbation: Secondary | ICD-10-CM | POA: Insufficient documentation

## 2015-12-14 DIAGNOSIS — Z7901 Long term (current) use of anticoagulants: Secondary | ICD-10-CM

## 2015-12-14 DIAGNOSIS — I63411 Cerebral infarction due to embolism of right middle cerebral artery: Secondary | ICD-10-CM | POA: Diagnosis not present

## 2015-12-14 DIAGNOSIS — I480 Paroxysmal atrial fibrillation: Secondary | ICD-10-CM

## 2015-12-14 DIAGNOSIS — I959 Hypotension, unspecified: Secondary | ICD-10-CM | POA: Insufficient documentation

## 2015-12-14 NOTE — Progress Notes (Signed)
STROKE NEUROLOGY FOLLOW UP NOTE  NAME: JAYRO MCMATH DOB: 12/09/1940  REASON FOR VISIT: stroke follow up HISTORY FROM: pt and wife and chart  Today we had the pleasure of seeing Mark [name] in follow-up at our Neurology Clinic. Pt was accompanied by wife.   History Summary KOLSON CHOVANEC is a 75 y.o. male with PMH of PAF not on AC, severe COPD with O2 dependent. He stated that in 11/2014, he was in a restaurant and had sudden onset word finding difficulties for 3-4 min and resolved. However, after that, wife found him to have intermittent confusion with remote control, dial phone numbers, slow in response to questions and mess up with date or time. Denies any weakness, numbness, LOC, dizziness, swallowing difficulty or seizure. He went to see PCP 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 He was seen by cardiology on 01/12/15 and had 2D ehco which was unremarkable. He was put on eliquis.  He was found to have PAF in 2012 when he was admitted for pneumonia, and was put on coumadin briefly. However, he developed bruise at his arms and he optioned to be off AC. He was on ASA since then. He also had aflutter and s/p ablation. He was told that his aflutter was cured but still has afib. He still has severe COPD and needs home O2, he is following with [name] and on steroids.  He is a former smoker, quit many years ago. Occasionally drink and denies illicit drugs.  04/05/2015 follow-up- the patient has been doing the same. No recurrent stroke like symptoms. Had MRA head and neck, confirmed right ICA occlusion and right MCA via collaterals. However, also showed right VA and right M2 high grade stenosis. Still has COPD on O2. BP today 128/70 and advised to avoid hypotension. Still on eliquis and lipitor, tolerating well.   08/06/2015 admitted for unwitnessed fall.  Patient cannot remember a steady but happened , concerning for syncope  versus seizure. EEG normal. He was put on Keppra due to history for stroke and the risk of seizure.  MRI showed chronic right frontal MCA infarct, no acute stroke.  Eliquis continued and he was discharged with Keppra.  Interval History During the interval time,  Patient follow up with Dr. Epimenio Foot on 08/15/15 in clinic and felt the presentation did not consistent with seizure and keppra was discontinued.  Patient was again admitted on 11/20/2015 due to pneumothorax for which he received chest tube and later removed.  He has a follow-up with cardiology and cardiovascular surgeon since discharge. Continued on Eliquis. Still need home oxygen. Blood pressure 102/59 on the low side.  Past Medical History  Diagnosis Date  . Atrial fibrillation (HCC)   . Hyperlipidemia   . Headache(784.0)   . Osteopenia   . COPD (chronic obstructive pulmonary disease) (HCC)   . Allergic rhinitis   . History of echocardiogram     Echo 5/16:  EF 55-60%, no RWMA, Gr 1 DD  . Incontinence of urine     and stool  . Memory loss   . CVA (cerebral infarction)   . Shortness of breath dyspnea   . GERD (gastroesophageal reflux disease)   . Stroke [facility])    Past Surgical History  Procedure Laterality Date  . Catheter ablation    . Cataract extraction, bilateral    . Appendectomy  1958  . Inguinal hernia repair  1975, 1992  .  Video assisted thoracoscopy (vats)/thorocotomy  08/17/2012    resection / stapling of blebs, mechanical pleurodesis   . Video assisted thoracoscopy  08/17/2012    Procedure: VIDEO ASSISTED THORACOSCOPY;  Surgeon: [name], MD;  Location: [facility] OR;  Service: Thoracic;  Laterality: Right;  . Resection of apical bleb  08/17/2012    Procedure: RESECTION OF APICAL BLEB;  Surgeon: [name], MD;  Location: [facility] OR;  Service: Thoracic;  Laterality: Right;  stapling of bleb  . Video bronchoscopy  08/27/2012    Procedure: VIDEO BRONCHOSCOPY;  Surgeon: [name], MD;  Location[facility] OR;  Service:  Thoracic;  Laterality: N/A;  evaluation of endobronchial valves   Family History  Problem Relation Age of Onset  . Heart attack Father   . COPD Father   . Lung cancer Father   . COPD Brother   . COPD Sister   . Stroke Mother   . Hypertension Brother   . Throat cancer Brother    Current Outpatient Prescriptions  Medication Sig Dispense Refill  . acetaminophen (TYLENOL) 325 MG tablet Take 650 mg by mouth every 6 (six) hours as needed for mild pain.     Marland Kitchen albuterol (PROAIR HFA) 108 (90 BASE) MCG/ACT inhaler Inhale 2 puffs into the lungs every 4 (four) hours as needed for wheezing or shortness of breath. For shortness of breath/wheezing 1 Inhaler prn  . albuterol (PROVENTIL) (2.5 MG/3ML) 0.083% nebulizer solution USE ONE VIAL VIA NEBULIZER 4 TIMES DAILY (Patient taking differently: USE ONE VIAL VIA NEBULIZER 4 TIMES DAILY as needed for wheezing or shortness of breath) 120 mL 4  . alendronate (FOSAMAX) 70 MG tablet Take 70 mg by mouth every 7 (seven) days. Takes on Tuesday Take with a full glass of water on an empty stomach.    Marland Kitchen alfuzosin (UROXATRAL) 10 MG 24 hr tablet Take 1 tablet by mouth daily.  11  . ALPRAZolam (XANAX) 0.25 MG tablet Take 1 tablet (0.25 mg total) by mouth 2 (two) times daily as needed for anxiety or sleep. 60 tablet 0  . apixaban (ELIQUIS) 5 MG TABS tablet Take 1 tablet (5 mg total) by mouth 2 (two) times daily. 60 tablet 11  . atorvastatin (LIPITOR) 20 MG tablet Take 1 tablet (20 mg total) by mouth daily. 90 tablet 3  . Calcium-Magnesium-Vitamin D (ONE-A-DAY CALCIUM PLUS) 500-50-100 MG-MG-UNIT CHEW Chew 1 tablet by mouth 2 (two) times daily.      Marland Kitchen diltiazem (CARDIZEM LA) 240 MG 24 hr tablet Take 1 tablet (240 mg total) by mouth daily. 30 tablet 3  . EPIPEN 2-PAK 0.3 MG/0.3ML SOAJ injection INJECT 0.3 MLS (0.3 MG TOTAL) INTO THE MUSCLE ONCE. FOR SEVERE SHORTNESS OF BREATH 1 Device 1  . furosemide (LASIX) 20 MG tablet Take 1 tablet (20 mg total) by mouth daily. 15 tablet  11  . guaiFENesin (MUCINEX) 600 MG 12 hr tablet Take 1-2 mg by mouth every 12 (twelve) hours as needed. For cough    . HYDROcodone-acetaminophen (NORCO/VICODIN) 5-325 MG tablet Take 1 tablet by mouth every 6 (six) hours as needed for moderate pain. 30 tablet 0  . Multiple Vitamins-Minerals (CENTRUM SILVER PO) Take 1 tablet by mouth daily.      Marland Kitchen MYRBETRIQ 50 MG TB24 tablet Take 1 tablet by mouth daily.    . Nebulizers (COMPRESSOR/NEBULIZER) MISC Use as directed 1 each 0  . omeprazole (PRILOSEC) 20 MG capsule Take 1 capsule (20 mg total) by mouth 2 (two) times daily. 60 capsule  1  . polyethylene glycol (MIRALAX / GLYCOLAX) packet Take 17 g by mouth daily as needed for mild constipation. 14 each 0  . predniSONE (DELTASONE) 20 MG tablet TAKE 1 TABLET (20 MG TOTAL) BY MOUTH DAILY. 30 tablet 5  . promethazine-codeine (PHENERGAN WITH CODEINE) 6.25-10 MG/5ML syrup TAKE 5 ML EVERY 6 HOURS AS NEEDED FOR COUGH 200 mL 0  . propafenone (RYTHMOL) 225 MG tablet TAKE ONE TABLET BY MOUTH TWICE A DAY 180 tablet 3  . Umeclidinium-Vilanterol (ANORO ELLIPTA) 62.5-25 MCG/INH AEPB Inhale 1 puff into the lungs daily. RINSE MOUTH WELL AFTER USE 3 each 3   No current facility-administered medications for this visit.   Allergies  Allergen Reactions  . Shrimp [Shellfish Allergy] Diarrhea and Nausea And Vomiting  . Zolpidem Tartrate     disoriented   Social History   Social History  . Marital Status: Married    Spouse Name: Windy Kalata  . Number of Children: 6  . Years of Education: 8   Occupational History  . Retired     Air traffic controller   Social History Main Topics  . Smoking status: Former Smoker -- 3.00 packs/day for 15 years    Types: Cigarettes  . Smokeless tobacco: Former Neurosurgeon    Quit date: 09/01/1965  . Alcohol Use: No  . Drug Use: No  . Sexual Activity: Not on file   Other Topics Concern  . Not on file   Social History Narrative   Married, 6 children   Right handed   Caffeine  use - soda 2 daily    Review of Systems Full 14 system review of systems performed and notable only for those listed, all others are neg:  Constitutional:  Fever/chills, fatigue Cardiovascular:  Ear/Nose/Throat:  Runny nose, trouble swallowing, drooling Skin:  Eyes:  Eye itching and eye redness Respiratory:  SOB, wheezing, cough Gastroitestinal:  Genitourinary: Hematology/Lymphatic:   Endocrine: Feeling cold/hot Musculoskeletal:  Allergy/Immunology:   Neurological:   Psychiatric:  Sleep:   Physical Exam  Filed Vitals:   12/14/15 1012  BP: 102/59  Pulse: 75    General - Well nourished, well developed, Mild respiratory distress on portable oxygen.  Ophthalmologic - fundi not visualized due to eye movement.  Cardiovascular - Regular rate and rhythm, no afib rhythm.   Mental Status -  Level of arousal and orientation to time, place, and person were intact. Language including expression, naming, repetition, comprehension, reading, and writing was assessed and found intact. Fund of Knowledge was assessed and was intact.  Cranial Nerves II - XII - II - Visual field intact OU. III, IV, VI - Extraocular movements intact. V - Facial sensation intact bilaterally. VII - Facial movement intact bilaterally. VIII - Hearing & vestibular intact bilaterally. X - Palate elevates symmetrically, mild dysarthria. XI - Chin turning & shoulder shrug intact bilaterally. XII - Tongue protrusion intact.  Motor Strength - The patient's strength was normal in all extremities and pronator drift was absent.  Bulk was normal and fasciculations were absent.   Motor Tone - Muscle tone was assessed at the neck and appendages and was normal.  Reflexes - The patient's reflexes were symmetrical in all extremities and he had no pathological reflexes.  Sensory - Light touch, temperature/pinprick were assessed and were normal.    Coordination - The patient had normal movements in the hands with no  ataxia or dysmetria.  Tremor was absent.  Gait and Station - not tested and pt in wheelchair.   Imaging  I have personally reviewed the radiological images below and agree with the radiology interpretations.  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 Acute and subacute RIGHT hemisphere ischemia as described. Tiny focus of chronic hemorrhage RIGHT frontal subcortical white matter could be acute or chronic Atrophy with chronic microvascular ischemic change.  2D echo -01/18/15  - Left ventricle: The cavity size was normal. Wall thickness was normal. Systolic function was normal. The estimated ejection fraction was in the range of 55% to 60%. Wall motion was normal; there were no regional wall motion abnormalities. Doppler parameters are consistent with abnormal left ventricular relaxation (grade 1 diastolic dysfunction). The E/e&' ratio is between 8-15, suggesting indeterminate LV filling pressure. - Left atrium: The atrium was normal in size Impressions: - Compared to the prior echo in 2011, there has been no change in LVEF.  MRA head - 1. The right ICA is occluded, with some retrograde filling of the right ICA terminus, possible via the anterior communicating artery and contralateral circulation. 2. The right M2 inferior division of right MCA appears to be occluded.  3. Moderate stenosis of left M2 inferior division of left MCA. 4. 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  MRA neck - Abnormal MRI neck (with and without) demonstrating: 1. 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 2. The right vertebral artery has focal high grade origin stenosis. There is addition focal stenosis (>50%) in the right vertebral artery 5.8cm distal to the vertebral origin, and then normal flow up to the vertebrobasilar junction. 3. The left internal carotid artery and left vertebral artery have no  stenosis.   MRI brain  08/06/2015 1. Motion degraded, incomplete study. 2. Chronic right frontal lobe MCA infarct with 1 cm focus of superimposed acute ischemia along its superior margin. 3. Moderate cerebral atrophy and chronic small vessel ischemic Disease.   2-D echo  08/07/2015 Left ventricle: The cavity size was normal. Wall thickness was  normal. Systolic function was normal. The estimated ejection  fraction was in the range of 60% to 65%. Wall motion was normal;  there were no regional wall motion abnormalities. The study is  not technically sufficient to allow evaluation of LV diastolic  function.   EEG This normal EEG is recorded in the waking and sleep state. There was no seizure or seizure predisposition recorded on this study. Please note that a normal EEG does not preclude the possibility of epilepsy.    TCD VMR -  Not able to complete it due to SOB and difficulty with breath-holding  Lab Review Component     Latest Ref Rng 02/21/2014 01/23/2015  Cholesterol, Total     100 - 199 mg/dL  161245 (H)  Triglycerides     0 - 149 mg/dL  096233 (H)  HDL Cholesterol     >39 mg/dL  53  VLDL Cholesterol Cal     5 - 40 mg/dL  47 (H)  LDL (calc)     0 - 99 mg/dL  045145 (H)  Total CHOL/HDL Ratio     0.0 - 5.0 ratio units  4.6  TSH     0.450 - 4.500 uIU/mL 0.76 1.960  Free T4     0.82 - 1.77 ng/dL  4.091.20  Hemoglobin W1XA1C     4.8 - 5.6 %  6.5 (H)  Est. average glucose Bld gHb Est-mCnc       140   Component  Latest Ref Rng 08/07/2015 11/21/2015  Cholesterol     0 - 200 mg/dL 409   Triglycerides     <150 mg/dL 811 (H)   HDL Cholesterol     >40 mg/dL 55   Total CHOL/HDL Ratio      2.8   VLDL     0 - 40 mg/dL 30   LDL (calc)     0 - 99 mg/dL 68   Hemoglobin B1Y     4.8 - 5.6 % 6.3 (H) 6.5 (H)  Mean Plasma Glucose      134 140     Assessment and Plan:   In summary, DIALLO PONDER is a 75 y.o. male with PMH of paroxysmal A. fib not on AC, severe COPD requiring  home oxygen follows up in neuro clinic. MRI showed right MCA infarct. Etiology most likely embolic pattern secondary to A. fib not on anticoagulation. 2-D echo normal EF. He is on eliquis for stroke prevention. MRA head and neck confirmed right ICA occlusion but also showed diffuse intracranial stenosis. Need risk factor follow up. TCD VMR not able to be done due to difficulty with breath holding.  Admitted in 07/2015 for fall and MRI negative for stroke and EEG normal.  Initially started on Keppra , but later discontinued on clinic follow-up. Continue eliquis and Lipitor for stroke prevention. BP at the lower side , recommend stocking socks.   Plan: - continue eliquis and lipitor for stroke prevention - check BP at home and record. BP goal 120-150 due to right ICA occlusion - Follow up with your primary care physician for stroke risk factor modification. Recommend maintain blood pressure goal  120-150/80, diabetes with hemoglobin A1c goal below 6.5% and lipids with LDL cholesterol goal below 70 mg/dL.  - follow up with cardiology and pulmonology for afib and COPD.  - recommend stocking socks to improve BP. Will arrange from [facility]. - follow up in 6 months.  Orders Placed This Encounter  Procedures  . Ambulatory Referral for DME    Referral Priority:  Routine    Referral Type:  Durable Medical Equipment Purchase    Number of Visits Requested:  1    No orders of the defined types were placed in this encounter.    Patient Instructions  - continue eliquis and lipitor for stroke prevention - check BP at home and record. Our BP goal 120-150 due to neck vessel occlusion - Follow up with your primary care physician for stroke risk factor modification. Recommend maintain blood pressure goal <130/80, diabetes with hemoglobin A1c goal below 6.5% and lipids with LDL cholesterol goal below 70 mg/dL.  - follow up with cardiology and pulmonology for afib and COPD.  - recommend TED Hose -  stocking socks to improve BP. Will arrange from [facility]. - follow up in 6 months.    Marvel Plan, MD PhD [facility] [address], [address] [place], [place] [address] [phone]

## 2015-12-14 NOTE — Patient Instructions (Addendum)
-   continue eliquis and lipitor for stroke prevention - check BP at home and record. Our BP goal 120-150 due to neck vessel occlusion - Follow up with your primary care physician for stroke risk factor modification. Recommend maintain blood pressure goal <130/80, diabetes with hemoglobin A1c goal below 6.5% and lipids with LDL cholesterol goal below 70 mg/dL.  - follow up with cardiology and pulmonology for afib and COPD.  - recommend TED Hose - stocking socks to improve BP. Will arrange from [facility]. - follow up in 6 months.

## 2015-12-16 ENCOUNTER — Inpatient Hospital Stay (HOSPITAL_COMMUNITY)
Admission: EM | Admit: 2015-12-16 | Discharge: 2015-12-20 | DRG: 190 | Disposition: A | Discharge status: Home Health | Payer: Medicare Other | Attending: Internal Medicine | Admitting: Internal Medicine

## 2015-12-16 ENCOUNTER — Emergency Department (HOSPITAL_COMMUNITY): Payer: Medicare Other

## 2015-12-16 ENCOUNTER — Encounter (HOSPITAL_COMMUNITY): Payer: Self-pay | Admitting: Emergency Medicine

## 2015-12-16 DIAGNOSIS — Z9981 Dependence on supplemental oxygen: Secondary | ICD-10-CM

## 2015-12-16 DIAGNOSIS — Z888 Allergy status to other drugs, medicaments and biological substances status: Secondary | ICD-10-CM

## 2015-12-16 DIAGNOSIS — J440 Chronic obstructive pulmonary disease with acute lower respiratory infection: Principal | ICD-10-CM | POA: Diagnosis present

## 2015-12-16 DIAGNOSIS — I4891 Unspecified atrial fibrillation: Secondary | ICD-10-CM | POA: Diagnosis present

## 2015-12-16 DIAGNOSIS — E785 Hyperlipidemia, unspecified: Secondary | ICD-10-CM | POA: Diagnosis not present

## 2015-12-16 DIAGNOSIS — J441 Chronic obstructive pulmonary disease with (acute) exacerbation: Secondary | ICD-10-CM | POA: Diagnosis not present

## 2015-12-16 DIAGNOSIS — Z7901 Long term (current) use of anticoagulants: Secondary | ICD-10-CM

## 2015-12-16 DIAGNOSIS — Y95 Nosocomial condition: Secondary | ICD-10-CM | POA: Diagnosis present

## 2015-12-16 DIAGNOSIS — Z8249 Family history of ischemic heart disease and other diseases of the circulatory system: Secondary | ICD-10-CM | POA: Diagnosis not present

## 2015-12-16 DIAGNOSIS — J4489 Other specified chronic obstructive pulmonary disease: Secondary | ICD-10-CM | POA: Diagnosis present

## 2015-12-16 DIAGNOSIS — Z823 Family history of stroke: Secondary | ICD-10-CM | POA: Diagnosis not present

## 2015-12-16 DIAGNOSIS — J309 Allergic rhinitis, unspecified: Secondary | ICD-10-CM | POA: Diagnosis present

## 2015-12-16 DIAGNOSIS — K219 Gastro-esophageal reflux disease without esophagitis: Secondary | ICD-10-CM | POA: Diagnosis present

## 2015-12-16 DIAGNOSIS — Z7952 Long term (current) use of systemic steroids: Secondary | ICD-10-CM | POA: Diagnosis not present

## 2015-12-16 DIAGNOSIS — Z8673 Personal history of transient ischemic attack (TIA), and cerebral infarction without residual deficits: Secondary | ICD-10-CM

## 2015-12-16 DIAGNOSIS — Z825 Family history of asthma and other chronic lower respiratory diseases: Secondary | ICD-10-CM | POA: Diagnosis not present

## 2015-12-16 DIAGNOSIS — J9601 Acute respiratory failure with hypoxia: Secondary | ICD-10-CM | POA: Diagnosis not present

## 2015-12-16 DIAGNOSIS — E1165 Type 2 diabetes mellitus with hyperglycemia: Secondary | ICD-10-CM | POA: Diagnosis present

## 2015-12-16 DIAGNOSIS — J449 Chronic obstructive pulmonary disease, unspecified: Secondary | ICD-10-CM | POA: Diagnosis present

## 2015-12-16 DIAGNOSIS — Z91013 Allergy to seafood: Secondary | ICD-10-CM | POA: Diagnosis not present

## 2015-12-16 DIAGNOSIS — J189 Pneumonia, unspecified organism: Secondary | ICD-10-CM | POA: Diagnosis present

## 2015-12-16 DIAGNOSIS — Z79899 Other long term (current) drug therapy: Secondary | ICD-10-CM

## 2015-12-16 DIAGNOSIS — Z87891 Personal history of nicotine dependence: Secondary | ICD-10-CM | POA: Diagnosis not present

## 2015-12-16 DIAGNOSIS — I10 Essential (primary) hypertension: Secondary | ICD-10-CM | POA: Diagnosis present

## 2015-12-16 DIAGNOSIS — E876 Hypokalemia: Secondary | ICD-10-CM | POA: Diagnosis present

## 2015-12-16 LAB — URINALYSIS, ROUTINE W REFLEX MICROSCOPIC
Bilirubin Urine: NEGATIVE
GLUCOSE, UA: NEGATIVE mg/dL
KETONES UR: NEGATIVE mg/dL
LEUKOCYTES UA: NEGATIVE
Nitrite: NEGATIVE
PH: 5.5 (ref 5.0–8.0)
Protein, ur: NEGATIVE mg/dL
Specific Gravity, Urine: 1.011 (ref 1.005–1.030)

## 2015-12-16 LAB — CBC WITH DIFFERENTIAL/PLATELET
BASOS ABS: 0 10*3/uL (ref 0.0–0.1)
Basophils Relative: 0 %
EOS ABS: 0.2 10*3/uL (ref 0.0–0.7)
EOS PCT: 2 %
HCT: 39.8 % (ref 39.0–52.0)
HEMOGLOBIN: 11.9 g/dL — AB (ref 13.0–17.0)
LYMPHS ABS: 1.8 10*3/uL (ref 0.7–4.0)
LYMPHS PCT: 20 %
MCH: 29.9 pg (ref 26.0–34.0)
MCHC: 29.9 g/dL — ABNORMAL LOW (ref 30.0–36.0)
MCV: 100 fL (ref 78.0–100.0)
Monocytes Absolute: 1.4 10*3/uL — ABNORMAL HIGH (ref 0.1–1.0)
Monocytes Relative: 15 %
NEUTROS PCT: 63 %
Neutro Abs: 5.6 10*3/uL (ref 1.7–7.7)
PLATELETS: 239 10*3/uL (ref 150–400)
RBC: 3.98 MIL/uL — AB (ref 4.22–5.81)
RDW: 15.5 % (ref 11.5–15.5)
WBC: 9 10*3/uL (ref 4.0–10.5)

## 2015-12-16 LAB — I-STAT TROPONIN, ED: TROPONIN I, POC: 0 ng/mL (ref 0.00–0.08)

## 2015-12-16 LAB — BASIC METABOLIC PANEL
ANION GAP: 14 (ref 5–15)
BUN: 7 mg/dL (ref 6–20)
CALCIUM: 8.7 mg/dL — AB (ref 8.9–10.3)
CHLORIDE: 93 mmol/L — AB (ref 101–111)
CO2: 32 mmol/L (ref 22–32)
CREATININE: 0.83 mg/dL (ref 0.61–1.24)
GFR calc Af Amer: >60 mL/min (ref >60)
GLUCOSE: 142 mg/dL — AB (ref 65–99)
POTASSIUM: 3.4 mmol/L — AB (ref 3.5–5.1)
SODIUM: 139 mmol/L (ref 135–145)

## 2015-12-16 LAB — I-STAT CG4 LACTIC ACID, ED: Lactic Acid, Venous: 1.5 mmol/L (ref 0.5–2.0)

## 2015-12-16 LAB — URINE MICROSCOPIC-ADD ON

## 2015-12-16 MED ORDER — CEFEPIME HCL 2 G IJ SOLR
2.0000 g | Freq: Once | INTRAMUSCULAR | Status: AC
  Administered 2015-12-16: 2 g via INTRAVENOUS
  Filled 2015-12-16: qty 2

## 2015-12-16 MED ORDER — METHYLPREDNISOLONE SODIUM SUCC 125 MG IJ SOLR
60.0000 mg | Freq: Three times a day (TID) | INTRAMUSCULAR | Status: DC
  Administered 2015-12-17 – 2015-12-18 (×4): 60 mg via INTRAVENOUS
  Filled 2015-12-16 (×4): qty 2

## 2015-12-16 MED ORDER — DEXTROSE 5 % IV SOLN
1.0000 g | Freq: Three times a day (TID) | INTRAVENOUS | Status: DC
  Administered 2015-12-17 – 2015-12-20 (×11): 1 g via INTRAVENOUS
  Filled 2015-12-16 (×14): qty 1

## 2015-12-16 MED ORDER — METHYLPREDNISOLONE SODIUM SUCC 125 MG IJ SOLR
125.0000 mg | Freq: Once | INTRAMUSCULAR | Status: AC
  Administered 2015-12-16: 125 mg via INTRAVENOUS
  Filled 2015-12-16: qty 2

## 2015-12-16 MED ORDER — PROPAFENONE HCL 225 MG PO TABS
225.0000 mg | ORAL_TABLET | Freq: Two times a day (BID) | ORAL | Status: DC
Start: 2015-12-16 — End: 2015-12-20
  Administered 2015-12-17 – 2015-12-20 (×8): 225 mg via ORAL
  Filled 2015-12-16 (×10): qty 1

## 2015-12-16 MED ORDER — MIRABEGRON ER 25 MG PO TB24
50.0000 mg | ORAL_TABLET | Freq: Every day | ORAL | Status: DC
  Administered 2015-12-17 – 2015-12-20 (×4): 50 mg via ORAL
  Filled 2015-12-16 (×4): qty 2

## 2015-12-16 MED ORDER — HYDROCODONE-ACETAMINOPHEN 5-325 MG PO TABS
1.0000 | ORAL_TABLET | Freq: Four times a day (QID) | ORAL | Status: DC | PRN
  Administered 2015-12-18: 1 via ORAL
  Filled 2015-12-16: qty 1

## 2015-12-16 MED ORDER — PANTOPRAZOLE SODIUM 40 MG PO TBEC
40.0000 mg | DELAYED_RELEASE_TABLET | Freq: Every day | ORAL | Status: DC
  Administered 2015-12-17 – 2015-12-20 (×4): 40 mg via ORAL
  Filled 2015-12-16 (×4): qty 1

## 2015-12-16 MED ORDER — APIXABAN 5 MG PO TABS
5.0000 mg | ORAL_TABLET | Freq: Two times a day (BID) | ORAL | Status: DC
  Administered 2015-12-17 – 2015-12-20 (×8): 5 mg via ORAL
  Filled 2015-12-16 (×8): qty 1

## 2015-12-16 MED ORDER — ALPRAZOLAM 0.25 MG PO TABS
0.2500 mg | ORAL_TABLET | Freq: Two times a day (BID) | ORAL | Status: DC | PRN
  Administered 2015-12-17 – 2015-12-18 (×2): 0.25 mg via ORAL
  Filled 2015-12-16 (×2): qty 1

## 2015-12-16 MED ORDER — GUAIFENESIN ER 600 MG PO TB12: 1.0000 mg | ORAL_TABLET | Freq: Two times a day (BID) | ORAL | Status: DC | PRN

## 2015-12-16 MED ORDER — ACETAMINOPHEN 325 MG PO TABS
650.0000 mg | ORAL_TABLET | Freq: Once | ORAL | Status: AC
  Administered 2015-12-16: 650 mg via ORAL
  Filled 2015-12-16: qty 2

## 2015-12-16 MED ORDER — INSULIN ASPART 100 UNIT/ML ~~LOC~~ SOLN
0.0000 [IU] | Freq: Three times a day (TID) | SUBCUTANEOUS | Status: DC
  Administered 2015-12-17: 5 [IU] via SUBCUTANEOUS
  Administered 2015-12-17: 1 [IU] via SUBCUTANEOUS
  Administered 2015-12-17: 2 [IU] via SUBCUTANEOUS

## 2015-12-16 MED ORDER — ATORVASTATIN CALCIUM 20 MG PO TABS
20.0000 mg | ORAL_TABLET | Freq: Every day | ORAL | Status: DC
  Administered 2015-12-17 – 2015-12-20 (×4): 20 mg via ORAL
  Filled 2015-12-16 (×4): qty 1

## 2015-12-16 MED ORDER — INSULIN ASPART 100 UNIT/ML ~~LOC~~ SOLN
0.0000 [IU] | Freq: Every day | SUBCUTANEOUS | Status: DC
Start: 2015-12-16 — End: 2015-12-18
  Administered 2015-12-17: 2 [IU] via SUBCUTANEOUS

## 2015-12-16 MED ORDER — ALBUTEROL SULFATE (2.5 MG/3ML) 0.083% IN NEBU: 2.5000 mg | INHALATION_SOLUTION | RESPIRATORY_TRACT | Status: DC | PRN

## 2015-12-16 MED ORDER — CALCIUM CARBONATE-VITAMIN D 500-200 MG-UNIT PO TABS
1.0000 | ORAL_TABLET | Freq: Two times a day (BID) | ORAL | Status: DC
  Administered 2015-12-17 – 2015-12-20 (×7): 1 via ORAL
  Filled 2015-12-16 (×8): qty 1

## 2015-12-16 MED ORDER — SODIUM CHLORIDE 0.9 % IV SOLN
INTRAVENOUS | Status: DC
  Administered 2015-12-16: via INTRAVENOUS
  Administered 2015-12-17: 1000 mL via INTRAVENOUS

## 2015-12-16 MED ORDER — GUAIFENESIN ER 600 MG PO TB12
600.0000 mg | ORAL_TABLET | Freq: Two times a day (BID) | ORAL | Status: DC | PRN
  Administered 2015-12-17 – 2015-12-19 (×2): 600 mg via ORAL
  Filled 2015-12-16 (×2): qty 1

## 2015-12-16 MED ORDER — VANCOMYCIN HCL IN DEXTROSE 1-5 GM/200ML-% IV SOLN
1000.0000 mg | Freq: Two times a day (BID) | INTRAVENOUS | Status: DC
  Administered 2015-12-17 – 2015-12-19 (×5): 1000 mg via INTRAVENOUS
  Filled 2015-12-16 (×7): qty 200

## 2015-12-16 MED ORDER — POTASSIUM CHLORIDE 20 MEQ/15ML (10%) PO SOLN
20.0000 meq | Freq: Once | ORAL | Status: AC
  Administered 2015-12-17: 20 meq via ORAL
  Filled 2015-12-16: qty 15

## 2015-12-16 MED ORDER — ADULT MULTIVITAMIN W/MINERALS CH
1.0000 | ORAL_TABLET | Freq: Every day | ORAL | Status: DC
  Administered 2015-12-17 – 2015-12-20 (×4): 1 via ORAL
  Filled 2015-12-16 (×4): qty 1

## 2015-12-16 MED ORDER — ALBUTEROL SULFATE (2.5 MG/3ML) 0.083% IN NEBU
2.5000 mg | INHALATION_SOLUTION | RESPIRATORY_TRACT | Status: DC | PRN
  Administered 2015-12-16: 2.5 mg via RESPIRATORY_TRACT
  Filled 2015-12-16: qty 3

## 2015-12-16 MED ORDER — SODIUM CHLORIDE 0.9 % IV BOLUS (SEPSIS)
500.0000 mL | Freq: Once | INTRAVENOUS | Status: AC
  Administered 2015-12-16: 500 mL via INTRAVENOUS

## 2015-12-16 MED ORDER — POLYETHYLENE GLYCOL 3350 17 G PO PACK
17.0000 g | PACK | Freq: Every day | ORAL | Status: DC | PRN
  Administered 2015-12-20: 17 g via ORAL
  Filled 2015-12-16: qty 1

## 2015-12-16 MED ORDER — ALFUZOSIN HCL ER 10 MG PO TB24
10.0000 mg | ORAL_TABLET | Freq: Every day | ORAL | Status: DC
  Administered 2015-12-17 – 2015-12-20 (×4): 10 mg via ORAL
  Filled 2015-12-16 (×4): qty 1

## 2015-12-16 MED ORDER — ACETAMINOPHEN 325 MG PO TABS
650.0000 mg | ORAL_TABLET | Freq: Four times a day (QID) | ORAL | Status: DC | PRN
  Administered 2015-12-19: 650 mg via ORAL
  Filled 2015-12-16: qty 2

## 2015-12-16 MED ORDER — ALENDRONATE SODIUM 70 MG PO TABS: 70.0000 mg | ORAL_TABLET | ORAL | Status: DC

## 2015-12-16 MED ORDER — DILTIAZEM HCL ER COATED BEADS 240 MG PO CP24
240.0000 mg | ORAL_CAPSULE | Freq: Every day | ORAL | Status: DC
  Administered 2015-12-17: 240 mg via ORAL
  Filled 2015-12-16: qty 1

## 2015-12-16 MED ORDER — IPRATROPIUM-ALBUTEROL 0.5-2.5 (3) MG/3ML IN SOLN
3.0000 mL | RESPIRATORY_TRACT | Status: DC
  Administered 2015-12-17 – 2015-12-18 (×7): 3 mL via RESPIRATORY_TRACT
  Filled 2015-12-16 (×7): qty 3

## 2015-12-16 MED ORDER — ENOXAPARIN SODIUM 40 MG/0.4ML ~~LOC~~ SOLN: 40.0000 mg | SUBCUTANEOUS | Status: DC

## 2015-12-16 MED ORDER — VANCOMYCIN HCL 10 G IV SOLR
1500.0000 mg | Freq: Once | INTRAVENOUS | Status: AC
  Administered 2015-12-16: 1500 mg via INTRAVENOUS
  Filled 2015-12-16: qty 1500

## 2015-12-16 NOTE — ED Notes (Signed)
Pt in EMS reporting SOB for past 2-3 hrs. Pt sats 86% RA, placed on NRB up to 98%. Hx COPD usually wears 2L [place] at home. Pt had spontaneous pneumo last mo, pt concerned bc he feels the same way.

## 2015-12-16 NOTE — ED Notes (Signed)
EDP at bedside  

## 2015-12-16 NOTE — Progress Notes (Signed)
Pharmacy Antibiotic Note  Mark [name] is a 75 y.o. male admitted on 12/16/2015 with pneumonia.  Pharmacy has been consulted for vancomycin dosing. Cefepime 2g IV x1 ordered in the ED.  Plan: -vancomycin 1500mg  IV x1 as loading dose, then 1g IV q12h. Goal trough 15-4920mcg/mL -f/u continuation of cefepime -follow c/s, clinical progression, renal function, trough at SS     Temp (24hrs), Avg:100.2 F (37.9 C), Min:100.2 F (37.9 C), Max:100.2 F (37.9 C)  No results for input(s): WBC, CREATININE, LATICACIDVEN, VANCOTROUGH, VANCOPEAK, VANCORANDOM, GENTTROUGH, GENTPEAK, GENTRANDOM, TOBRATROUGH, TOBRAPEAK, TOBRARND, AMIKACINPEAK, AMIKACINTROU, AMIKACIN in the last 168 hours.  Estimated Creatinine Clearance: 72.3 mL/min (by C-G formula based on Cr of 0.94).    Allergies  Allergen Reactions  . Zolpidem Tartrate Other (See Comments)    disoriented  . Shrimp [Shellfish Allergy] Diarrhea and Nausea And Vomiting    Antimicrobials this admission: vanc 4/23 >>  cefepime 4/23 >>   Dose adjustments this admission: n/a  Microbiology results: 4/23 BCx: sent 4/23 UCx: sent    Thank you for allowing pharmacy to be a part of this patient's care.  Makaila Windle D. Suhaib Guzzo, PharmD, BCPS Clinical Pharmacist Pager: [phone] 12/16/2015 9:06 PM

## 2015-12-16 NOTE — ED Provider Notes (Signed)
CSN: [id number]     Arrival date & time 12/16/15  2006 History   First MD Initiated Contact with Patient 12/16/15 2020     Chief Complaint  Patient presents with  . Shortness of Breath      HPI  Patient presents for evaluation of fever, and shortness of breath.  History for COPD, atrial fibrillation, distant right pneumothorax with VATS, recent left pneumothorax treated with Chest tube. Admitted 328, through 4-4.  Patient describes dyspnea even at rest with cough over the course of the day today. His wife states that he was weak and she does have a bedside commode. She was helping him to it and he fell. He did not injure himself. She states she became very emotionally upset after this. He was coughing and more short of breath. Temperature at home 102.  Patient on 2 L of nasal cannula home O2 regularly.  EMS describes dyspnea wheezing and mild restaurant distress. Given nebulized albuterol. Placed on mask O2. On arrival here he was placed on 2 L and found to be 84%. On 4 L he is 92%.  He denies pain or injury from his fall. Prep with isolated no nausea vomiting. No chest pain.  Past Medical History  Diagnosis Date  . Atrial fibrillation (HCC)   . Hyperlipidemia   . Headache(784.0)   . Osteopenia   . COPD (chronic obstructive pulmonary disease) (HCC)   . Allergic rhinitis   . History of echocardiogram     Echo 5/16:  EF 55-60%, no RWMA, Gr 1 DD  . Incontinence of urine     and stool  . Memory loss   . CVA (cerebral infarction)   . Shortness of breath dyspnea   . GERD (gastroesophageal reflux disease)   . Stroke [facility])    Past Surgical History  Procedure Laterality Date  . Catheter ablation    . Cataract extraction, bilateral    . Appendectomy  1958  . Inguinal hernia repair  1975, 1992  . Video assisted thoracoscopy (vats)/thorocotomy  08/17/2012    resection / stapling of blebs, mechanical pleurodesis   . Video assisted thoracoscopy  08/17/2012    Procedure: VIDEO  ASSISTED THORACOSCOPY;  Surgeon: [name], MD;  Location: [facility] OR;  Service: Thoracic;  Laterality: Right;  . Resection of apical bleb  08/17/2012    Procedure: RESECTION OF APICAL BLEB;  Surgeon: [name], MD;  Location: [facility] OR;  Service: Thoracic;  Laterality: Right;  stapling of bleb  . Video bronchoscopy  08/27/2012    Procedure: VIDEO BRONCHOSCOPY;  Surgeon: [name], MD;  Location: [facility] OR;  Service: Thoracic;  Laterality: N/A;  evaluation of endobronchial valves   Family History  Problem Relation Age of Onset  . Heart attack Father   . COPD Father   . Lung cancer Father   . COPD Brother   . COPD Sister   . Stroke Mother   . Hypertension Brother   . Throat cancer Brother    Social History  Substance Use Topics  . Smoking status: Former Smoker -- 3.00 packs/day for 15 years    Types: Cigarettes  . Smokeless tobacco: Former Neurosurgeon    Quit date: 09/01/1965  . Alcohol Use: No    Review of Systems  Constitutional: Positive for fever and fatigue. Negative for chills, diaphoresis and appetite change.  HENT: Negative for mouth sores, sore throat and trouble swallowing.   Eyes: Negative for visual disturbance.  Respiratory: Positive for cough, shortness  of breath and wheezing. Negative for chest tightness.   Cardiovascular: Negative for chest pain.  Gastrointestinal: Negative for nausea, vomiting, abdominal pain, diarrhea and abdominal distention.  Endocrine: Negative for polydipsia, polyphagia and polyuria.  Genitourinary: Negative for dysuria, frequency and hematuria.  Musculoskeletal: Negative for gait problem.  Skin: Negative for color change, pallor and rash.  Neurological: Negative for dizziness, syncope, light-headedness and headaches.  Hematological: Does not bruise/bleed easily.  Psychiatric/Behavioral: Negative for behavioral problems and confusion.      Allergies  Zolpidem tartrate and Shrimp  Home Medications   Prior to Admission medications    Medication Sig Start Date End Date Taking? Authorizing Provider  acetaminophen (TYLENOL) 325 MG tablet Take 650 mg by mouth every 6 (six) hours as needed for mild pain.    Yes Historical Provider, MD  albuterol (PROAIR HFA) 108 (90 BASE) MCG/ACT inhaler Inhale 2 puffs into the lungs every 4 (four) hours as needed for wheezing or shortness of breath. For shortness of breath/wheezing 07/26/15  Yes Waymon Budgelinton D Young, MD  albuterol (PROVENTIL) (2.5 MG/3ML) 0.083% nebulizer solution USE ONE VIAL VIA NEBULIZER 4 TIMES DAILY Patient taking differently: USE ONE VIAL VIA NEBULIZER 4 TIMES DAILY as needed for wheezing or shortness of breath 07/13/14  Yes Waymon Budgelinton D Young, MD  alendronate (FOSAMAX) 70 MG tablet Take 70 mg by mouth every 7 (seven) days. Takes on Tuesday Take with a full glass of water on an empty stomach.   Yes Historical Provider, MD  alfuzosin (UROXATRAL) 10 MG 24 hr tablet Take 1 tablet by mouth daily. 07/11/15  Yes Historical Provider, MD  ALPRAZolam Prudy Feeler(XANAX) 0.25 MG tablet Take 1 tablet (0.25 mg total) by mouth 2 (two) times daily as needed for anxiety or sleep. 11/28/15  Yes Maryruth Bunhristina P Rama, MD  apixaban (ELIQUIS) 5 MG TABS tablet Take 1 tablet (5 mg total) by mouth 2 (two) times daily. 01/12/15  Yes Scott Moishe Spice Weaver, PA-C  atorvastatin (LIPITOR) 20 MG tablet Take 1 tablet (20 mg total) by mouth daily. 08/08/15  Yes Marquette SaaAbigail Joseph Lancaster, MD  Calcium-Magnesium-Vitamin D (ONE-A-DAY CALCIUM PLUS) 500-50-100 MG-MG-UNIT CHEW Chew 1 tablet by mouth 2 (two) times daily.     Yes Historical Provider, MD  diltiazem (CARDIZEM LA) 240 MG 24 hr tablet Take 1 tablet (240 mg total) by mouth daily. 11/28/15  Yes Christina P Rama, MD  EPIPEN 2-PAK 0.3 MG/0.3ML SOAJ injection INJECT 0.3 MLS (0.3 MG TOTAL) INTO THE MUSCLE ONCE. FOR SEVERE SHORTNESS OF BREATH 09/25/15  Yes Waymon Budgelinton D Young, MD  furosemide (LASIX) 20 MG tablet Take 1 tablet (20 mg total) by mouth daily. 11/28/15  Yes Christina P Rama, MD  guaiFENesin  (MUCINEX) 600 MG 12 hr tablet Take 1-2 mg by mouth every 12 (twelve) hours as needed. For cough   Yes Historical Provider, MD  HYDROcodone-acetaminophen (NORCO/VICODIN) 5-325 MG tablet Take 1 tablet by mouth every 6 (six) hours as needed for moderate pain. 11/28/15  Yes Maryruth Bunhristina P Rama, MD  Multiple Vitamins-Minerals (CENTRUM SILVER PO) Take 1 tablet by mouth daily.     Yes Historical Provider, MD  MYRBETRIQ 50 MG TB24 tablet Take 1 tablet by mouth daily. 10/03/15  Yes Historical Provider, MD  omeprazole (PRILOSEC) 20 MG capsule Take 1 capsule (20 mg total) by mouth 2 (two) times daily. 11/04/12  Yes Glori LuisEric G Sonnenberg, MD  OXYGEN Inhale 2-4 L into the lungs daily as needed (2 L for sleep and 4L for exertion).   Yes Historical Provider, MD  polyethylene glycol (MIRALAX / GLYCOLAX) packet Take 17 g by mouth daily as needed for mild constipation. 11/28/15  Yes Christina P Rama, MD  predniSONE (DELTASONE) 20 MG tablet TAKE 1 TABLET (20 MG TOTAL) BY MOUTH DAILY. 06/26/15  Yes Waymon Budge, MD  promethazine-codeine (PHENERGAN WITH CODEINE) 6.25-10 MG/5ML syrup TAKE 5 ML EVERY 6 HOURS AS NEEDED FOR COUGH 11/28/15  Yes Maryruth Bun Rama, MD  propafenone (RYTHMOL) 225 MG tablet TAKE ONE TABLET BY MOUTH TWICE A DAY 01/24/15  Yes Scott T Weaver, PA-C  Umeclidinium-Vilanterol (ANORO ELLIPTA) 62.5-25 MCG/INH AEPB Inhale 1 puff into the lungs daily. RINSE MOUTH WELL AFTER USE 11/22/14  Yes Waymon Budge, MD  Nebulizers (COMPRESSOR/NEBULIZER) MISC Use as directed 06/16/13   Waymon Budge, MD   BP 147/57 mmHg  Pulse 89  Temp(Src) 100.2 F (37.9 C) (Oral)  Resp 26  SpO2 92% Physical Exam  Constitutional: He is oriented to person, place, and time. He appears ill. No distress.  HENT:  Head: Normocephalic.  Eyes: Conjunctivae are normal. Pupils are equal, round, and reactive to light. No scleral icterus.  Neck: Normal range of motion. Neck supple. No thyromegaly present.  Cardiovascular: Normal rate and regular  rhythm.  Exam reveals no gallop and no friction rub.   No murmur heard. Is not tachycardic. No S4 gallop.  Pulmonary/Chest: Effort normal and breath sounds normal. No respiratory distress. He has no wheezes. He has no rales.  No subcutaneous air palpable in the neck or chest. He has crackles in the left lower lung. Overall globally diminished breath sounds, tachypnea, prolongation.  Abdominal: Soft. Bowel sounds are normal. He exhibits no distension. There is no tenderness. There is no rebound.  Musculoskeletal: Normal range of motion.  Neurological: He is alert and oriented to person, place, and time.  Skin: Skin is warm and dry. No rash noted.  Psychiatric: He has a normal mood and affect. His behavior is normal.    ED Course  Procedures (including critical care time) Labs Review Labs Reviewed  CBC WITH DIFFERENTIAL/PLATELET - Abnormal; Notable for the following:    RBC 3.98 (*)    Hemoglobin 11.9 (*)    MCHC 29.9 (*)    Monocytes Absolute 1.4 (*)    All other components within normal limits  BASIC METABOLIC PANEL - Abnormal; Notable for the following:    Potassium 3.4 (*)    Chloride 93 (*)    Glucose, Bld 142 (*)    Calcium 8.7 (*)    All other components within normal limits  URINALYSIS, ROUTINE W REFLEX MICROSCOPIC (NOT AT [facility]) - Abnormal; Notable for the following:    Hgb urine dipstick LARGE (*)    All other components within normal limits  URINE MICROSCOPIC-ADD ON - Abnormal; Notable for the following:    Squamous Epithelial / LPF 0-5 (*)    Bacteria, UA RARE (*)    All other components within normal limits  CULTURE, BLOOD (ROUTINE X 2)  CULTURE, BLOOD (ROUTINE X 2)  URINE CULTURE  I-STAT CG4 LACTIC ACID, ED  Rosezena Sensor, ED    Imaging Review Dg Chest Port 1 View  12/16/2015  CLINICAL DATA:  Shortness of breath and fever. EXAM: PORTABLE CHEST 1 VIEW COMPARISON:  12/05/2015 chest radiograph. FINDINGS: Stable cardiomediastinal silhouette with normal heart  size. No pneumothorax. Stable blunting of the right costophrenic angle, probably chronic pleural-parenchymal scarring. No definite pleural effusion. Advanced emphysema. Multiple surgical sutures throughout the right lung. Hazy opacity at the left  lung base appears slightly increased. No pulmonary edema. IMPRESSION: 1. Hazy left lung base opacity, favor scarring or atelectasis, difficult to exclude a developing pneumonia. Consider further evaluation with PA and lateral chest radiographs. 2. Advanced emphysema. Electronically Signed   By: Delbert Phenix M.D.   On: 12/16/2015 21:19   I have personally reviewed and evaluated these images and lab results as part of my medical decision-making.   EKG Interpretation None      MDM   Final diagnoses:  HCAP (healthcare-associated pneumonia)    Nebulized albuterol here upon arrival. Given by mouth Tylenol. Given vancomycin, and Maxipime for possible healthcare associated pneumonia. Chest x-ray shows left lower lobe pneumonia. He is not tachycardic, or hypotensive. His worker breathing has improved and states clinically he feels "better". He still has an increased work of breathing above his baseline. He does not appear fatigued, altered, or markedly weakened.  Normal troponin, and lactate. Discussed case with Dr. Clyde Lundborg of [facility] hospital[facility]. He is to be admitted.    Rolland Porter, MD 12/16/15 2225

## 2015-12-16 NOTE — H&P (Signed)
History and Physical    Mark [name]:[id number] DOB: 06-11-1941 DOA: 12/16/2015  Referring MD/NP/PA:  PCP: [name], MD  Outpatient Specialists: Neurologist, Dr. Roda Shutters  Patient coming from:  Home  Chief Complaint: Cough, fever and shortness of breath  HPI: Mark [name] is a 75 y.o. male with medical history significant of COPD on 2 L oxygen at home, hyperlipidemia, GERD, atrial fibrillation on Eliquis, stroke, who presents with cough, fever and shortness of breath.  Patient was recently hospitalized from 3/28 to 4/5 due to spontaneous pneumothorax, his chest tube was removed on 11/24/15. He has been doing fine until today when he developed fever, worsening short of breath and cough. He coughs up little yellow colored sputum. No chest pain. He has runny nose and sore throat. Patient does not have abdominal pain, nausea, vomiting, diarrhea, symptoms of a UTI or unilateral weakness.  ED Course: pt was found to have WBC 9.0, temperature 100.2, no tachycardia, tachypnea, oxygen desaturation to 92%, lactate 1.50, negative troponin, negative urinalysis, potassium 3.4, creatinine normal. Chest x-ray showed possible left base infiltration. Patient admitted to inpatient for further eval and treatment.   Can patient participate in ADLs?  Little  Review of Systems:   General: has fevers, chills, no changes in body weight, has poor appetite, has fatigue HEENT: no blurry vision, hearing changes or sore throat Pulm: has dyspnea, coughing, wheezing CV: no chest pain, no palpitations Abd: no nausea, vomiting, abdominal pain, diarrhea, constipation GU: no dysuria, burning on urination, increased urinary frequency, hematuria  Ext: no leg edema Neuro: no unilateral weakness, numbness, or tingling, no vision change or hearing loss Skin: no rash MSK: No muscle spasm, no deformity, no limitation of range of movement in spin Heme: No easy bruising.  Travel history: No recent long distant  travel.  Allergy:  Allergies  Allergen Reactions  . Zolpidem Tartrate Other (See Comments)    disoriented  . Shrimp [Shellfish Allergy] Diarrhea and Nausea And Vomiting    Past Medical History  Diagnosis Date  . Atrial fibrillation (HCC)   . Hyperlipidemia   . Headache(784.0)   . Osteopenia   . COPD (chronic obstructive pulmonary disease) (HCC)   . Allergic rhinitis   . History of echocardiogram     Echo 5/16:  EF 55-60%, no RWMA, Gr 1 DD  . Incontinence of urine     and stool  . Memory loss   . CVA (cerebral infarction)   . Shortness of breath dyspnea   . GERD (gastroesophageal reflux disease)   . Stroke [facility])     Past Surgical History  Procedure Laterality Date  . Catheter ablation    . Cataract extraction, bilateral    . Appendectomy  1958  . Inguinal hernia repair  1975, 1992  . Video assisted thoracoscopy (vats)/thorocotomy  08/17/2012    resection / stapling of blebs, mechanical pleurodesis   . Video assisted thoracoscopy  08/17/2012    Procedure: VIDEO ASSISTED THORACOSCOPY;  Surgeon: [name], MD;  Location: [facility] OR;  Service: Thoracic;  Laterality: Right;  . Resection of apical bleb  08/17/2012    Procedure: RESECTION OF APICAL BLEB;  Surgeon: [name], MD;  Location: [facility] OR;  Service: Thoracic;  Laterality: Right;  stapling of bleb  . Video bronchoscopy  08/27/2012    Procedure: VIDEO BRONCHOSCOPY;  Surgeon: [name], MD;  Location: [facility] OR;  Service: Thoracic;  Laterality: N/A;  evaluation of endobronchial valves    Social History:  reports that he has quit smoking. His smoking use included Cigarettes. He has a 45 pack-year smoking history. He quit smokeless tobacco use about 50 years ago. He reports that he does not drink alcohol or use illicit drugs.  Family History:  Family History  Problem Relation Age of Onset  . Heart attack Father   . COPD Father   . Lung cancer Father   . COPD Brother   . COPD Sister   . Stroke Mother   .  Hypertension Brother   . Throat cancer Brother      Prior to Admission medications   Medication Sig Start Date End Date Taking? Authorizing Provider  acetaminophen (TYLENOL) 325 MG tablet Take 650 mg by mouth every 6 (six) hours as needed for mild pain.    Yes Historical Provider, MD  albuterol (PROAIR HFA) 108 (90 BASE) MCG/ACT inhaler Inhale 2 puffs into the lungs every 4 (four) hours as needed for wheezing or shortness of breath. For shortness of breath/wheezing 07/26/15  Yes Waymon Budge, MD  albuterol (PROVENTIL) (2.5 MG/3ML) 0.083% nebulizer solution USE ONE VIAL VIA NEBULIZER 4 TIMES DAILY Patient taking differently: USE ONE VIAL VIA NEBULIZER 4 TIMES DAILY as needed for wheezing or shortness of breath 07/13/14  Yes Waymon Budge, MD  alendronate (FOSAMAX) 70 MG tablet Take 70 mg by mouth every 7 (seven) days. Takes on Tuesday Take with a full glass of water on an empty stomach.   Yes Historical Provider, MD  alfuzosin (UROXATRAL) 10 MG 24 hr tablet Take 1 tablet by mouth daily. 07/11/15  Yes Historical Provider, MD  ALPRAZolam Prudy Feeler) 0.25 MG tablet Take 1 tablet (0.25 mg total) by mouth 2 (two) times daily as needed for anxiety or sleep. 11/28/15  Yes Maryruth Bun Rama, MD  apixaban (ELIQUIS) 5 MG TABS tablet Take 1 tablet (5 mg total) by mouth 2 (two) times daily. 01/12/15  Yes Scott Moishe Spice, PA-C  atorvastatin (LIPITOR) 20 MG tablet Take 1 tablet (20 mg total) by mouth daily. 08/08/15  Yes Marquette Saa, MD  Calcium-Magnesium-Vitamin D (ONE-A-DAY CALCIUM PLUS) 500-50-100 MG-MG-UNIT CHEW Chew 1 tablet by mouth 2 (two) times daily.     Yes Historical Provider, MD  diltiazem (CARDIZEM LA) 240 MG 24 hr tablet Take 1 tablet (240 mg total) by mouth daily. 11/28/15  Yes Christina P Rama, MD  EPIPEN 2-PAK 0.3 MG/0.3ML SOAJ injection INJECT 0.3 MLS (0.3 MG TOTAL) INTO THE MUSCLE ONCE. FOR SEVERE SHORTNESS OF BREATH 09/25/15  Yes Waymon Budge, MD  furosemide (LASIX) 20 MG tablet Take  1 tablet (20 mg total) by mouth daily. 11/28/15  Yes Christina P Rama, MD  guaiFENesin (MUCINEX) 600 MG 12 hr tablet Take 1-2 mg by mouth every 12 (twelve) hours as needed. For cough   Yes Historical Provider, MD  HYDROcodone-acetaminophen (NORCO/VICODIN) 5-325 MG tablet Take 1 tablet by mouth every 6 (six) hours as needed for moderate pain. 11/28/15  Yes Maryruth Bun Rama, MD  Multiple Vitamins-Minerals (CENTRUM SILVER PO) Take 1 tablet by mouth daily.     Yes Historical Provider, MD  MYRBETRIQ 50 MG TB24 tablet Take 1 tablet by mouth daily. 10/03/15  Yes Historical Provider, MD  omeprazole (PRILOSEC) 20 MG capsule Take 1 capsule (20 mg total) by mouth 2 (two) times daily. 11/04/12  Yes Glori Luis, MD  OXYGEN Inhale 2-4 L into the lungs daily as needed (2 L for sleep and 4L for exertion).   Yes Historical Provider, MD  polyethylene glycol (MIRALAX / GLYCOLAX) packet Take 17 g by mouth daily as needed for mild constipation. 11/28/15  Yes Christina P Rama, MD  predniSONE (DELTASONE) 20 MG tablet TAKE 1 TABLET (20 MG TOTAL) BY MOUTH DAILY. 06/26/15  Yes Waymon Budge, MD  promethazine-codeine (PHENERGAN WITH CODEINE) 6.25-10 MG/5ML syrup TAKE 5 ML EVERY 6 HOURS AS NEEDED FOR COUGH 11/28/15  Yes Maryruth Bun Rama, MD  propafenone (RYTHMOL) 225 MG tablet TAKE ONE TABLET BY MOUTH TWICE A DAY 01/24/15  Yes Scott T Weaver, PA-C  Umeclidinium-Vilanterol (ANORO ELLIPTA) 62.5-25 MCG/INH AEPB Inhale 1 puff into the lungs daily. RINSE MOUTH WELL AFTER USE 11/22/14  Yes Waymon Budge, MD  Nebulizers (COMPRESSOR/NEBULIZER) MISC Use as directed 06/16/13   Waymon Budge, MD    Physical Exam: Filed Vitals:   12/16/15 2022 12/16/15 2030 12/16/15 2100 12/16/15 2130  BP: 142/70 147/79 138/61 147/57  Pulse:  94 97 89  Temp: 100.2 F (37.9 C)     TempSrc: Oral     Resp: SpO2: 100% 99% 97% 92%   General: Not in acute distress HEENT:       Eyes: PERRL, EOMI, no scleral icterus.       ENT: No discharge  from the ears and nose, no pharynx injection, no tonsillar enlargement.        Neck: No JVD, no bruit, no mass felt. Heme: No neck lymph node enlargement. Cardiac: S1/S2, RRR, No murmurs, No gallops or rubs. Pulm: has rales, wheezing, rhonchi bilaterally.  Abd: Soft, nondistended, nontender, no rebound pain, no organomegaly, BS present. GU: No hematuria Ext: No pitting leg edema bilaterally. 2+DP/PT pulse bilaterally. Musculoskeletal: No joint deformities, No joint redness or warmth, no limitation of ROM in spin. Skin: No rashes.  Neuro: Alert, oriented X3, cranial nerves II-XII grossly intact, moves all extremities normally. Psych: Patient is not psychotic, no suicidal or hemocidal ideation.  Labs on Admission: I have personally reviewed following labs and imaging studies  CBC:  Recent Labs Lab 12/16/15 2057  WBC 9.0  NEUTROABS 5.6  HGB 11.9*  HCT 39.8  MCV 100.0  PLT 239   Basic Metabolic Panel:  Recent Labs Lab 12/16/15 2057  NA 139  K 3.4*  CL 93*  CO2 32  GLUCOSE 142*  BUN 7  CREATININE 0.83  CALCIUM 8.7*   GFR: Estimated Creatinine Clearance: 81.9 mL/min (by C-G formula based on Cr of 0.83). Liver Function Tests: No results for input(s): AST, ALT, ALKPHOS, BILITOT, PROT, ALBUMIN in the last 168 hours. No results for input(s): LIPASE, AMYLASE in the last 168 hours. No results for input(s): AMMONIA in the last 168 hours. Coagulation Profile: No results for input(s): INR, PROTIME in the last 168 hours. Cardiac Enzymes: No results for input(s): CKTOTAL, CKMB, CKMBINDEX, TROPONINI in the last 168 hours. BNP (last 3 results) No results for input(s): PROBNP in the last 8760 hours. HbA1C: No results for input(s): HGBA1C in the last 72 hours. CBG: No results for input(s): GLUCAP in the last 168 hours. Lipid Profile: No results for input(s): CHOL, HDL, LDLCALC, TRIG, CHOLHDL, LDLDIRECT in the last 72 hours. Thyroid Function Tests: No results for input(s): TSH,  T4TOTAL, FREET4, T3FREE, THYROIDAB in the last 72 hours. Anemia Panel: No results for input(s): VITAMINB12, FOLATE, FERRITIN, TIBC, IRON, RETICCTPCT in the last 72 hours. Urine analysis:    Component Value Date/Time   COLORURINE YELLOW 12/16/2015 2124   APPEARANCEUR CLEAR 12/16/2015 2124   LABSPEC 1.011  12/16/2015 2124   PHURINE 5.5 12/16/2015 2124   GLUCOSEU NEGATIVE 12/16/2015 2124   HGBUR LARGE* 12/16/2015 2124   BILIRUBINUR NEGATIVE 12/16/2015 2124   KETONESUR NEGATIVE 12/16/2015 2124   PROTEINUR NEGATIVE 12/16/2015 2124   UROBILINOGEN 0.2 10/30/2012 1429   NITRITE NEGATIVE 12/16/2015 2124   LEUKOCYTESUR NEGATIVE 12/16/2015 2124   Sepsis Labs: @LABRCNTIP (procalcitonin:4,lacticidven:4) )No results found for this or any previous visit (from the past 240 hour(s)).   Radiological Exams on Admission: Dg Chest Port 1 View  12/16/2015  CLINICAL DATA:  Shortness of breath and fever. EXAM: PORTABLE CHEST 1 VIEW COMPARISON:  12/05/2015 chest radiograph. FINDINGS: Stable cardiomediastinal silhouette with normal heart size. No pneumothorax. Stable blunting of the right costophrenic angle, probably chronic pleural-parenchymal scarring. No definite pleural effusion. Advanced emphysema. Multiple surgical sutures throughout the right lung. Hazy opacity at the left lung base appears slightly increased. No pulmonary edema. IMPRESSION: 1. Hazy left lung base opacity, favor scarring or atelectasis, difficult to exclude a developing pneumonia. Consider further evaluation with PA and lateral chest radiographs. 2. Advanced emphysema. Electronically Signed   By: Delbert PhenixJason A Poff M.D.   On: 12/16/2015 21:19     EKG: Independently reviewed. QTC 441, atrial fibrillation, occasional PVC   Assessment/Plan Principal Problem:   HCAP (healthcare-associated pneumonia) Active Problems:   HLD (hyperlipidemia)   COPD with chronic bronchitis-oxygen dependent   History of CVA (cerebrovascular accident) 5/16    Essential hypertension   Acute respiratory failure with hypoxia (HCC)   COPD exacerbation (HCC)   Chronic anticoagulation   Hypokalemia   Acute respiratory failure with hypoxia due to combination of possible HCAP and COPD exacerbation: Patient has possible left base opacity, indicating possible HCAP. He haa bilateral wheezing and rhonchi, indicating COPD exacerbation. Patient does not meet criteria for sepsis on admission. Lactate is normal. Hemodynamically stable.  -will admit patient to SDU since pt may need BiPAP -Nebulizers: scheduled Duoneb and prn albuterol -Solu-Medrol 60 mg IV tid -IV vancomycin and cefepime -Mucinex for cough  -Urine S. Pneumococcal and Legionella antigen -Follow up blood culture x2, sputum culture, respiratory virus panel, Flu pcr -Stat ABG  History of CVA (cerebrovascular accident): -on Eliquis and lipitor  HTN: -Hold lasix since pt is at risk of developing sepsis -continue Alfuzosin, Cardizem  GERD: -Protonix  Atrial Fibrillation: CHA2DS2-VASc Score is 4, needs oral anticoagulation. Patient is on Eliquis at home. Heart rate is well controlled. -continue Eliquis -continue propranolol and Cardizem  Hypokalemia: K=3.4 on admission. - Repleted - Check Mg level   DVT ppx: on Eliquis Code Status: Full code Family Communication: Yes, patient's wife  at bed side Disposition Plan:  Anticipate discharge back to previous home environment Consults called: none Admission status:  (inpatient /SDU)   Date of Service 12/16/2015    Lorretta HarpIU, Alazne Quant [facility] Pager [phone]  If 7PM-7AM, please contact night-coverage [email or web address] Password [facility] 12/16/2015, 10:56 PM

## 2015-12-17 DIAGNOSIS — J449 Chronic obstructive pulmonary disease, unspecified: Secondary | ICD-10-CM

## 2015-12-17 LAB — BLOOD GAS, ARTERIAL
ACID-BASE EXCESS: 9.4 mmol/L — AB (ref 0.0–2.0)
BICARBONATE: 34.8 meq/L — AB (ref 20.0–24.0)
Drawn by: 449561
FIO2: 0.36
O2 Saturation: 98.3 %
PATIENT TEMPERATURE: 98.6
PCO2 ART: 60.8 mmHg — AB (ref 35.0–45.0)
PH ART: 7.376 (ref 7.350–7.450)
PO2 ART: 128 mmHg — AB (ref 80.0–100.0)
TCO2: 36.6 mmol/L (ref 0–100)

## 2015-12-17 LAB — GLUCOSE, CAPILLARY
Glucose-Capillary: 144 mg/dL — ABNORMAL HIGH (ref 65–99)
Glucose-Capillary: 163 mg/dL — ABNORMAL HIGH (ref 65–99)
Glucose-Capillary: 272 mg/dL — ABNORMAL HIGH (ref 65–99)
Glucose-Capillary: 140 mg/dL — ABNORMAL HIGH (ref 65–99)
Glucose-Capillary: 220 mg/dL — ABNORMAL HIGH (ref 65–99)

## 2015-12-17 LAB — URINE CULTURE

## 2015-12-17 LAB — MAGNESIUM: MAGNESIUM: 2.1 mg/dL (ref 1.7–2.4)

## 2015-12-17 LAB — PROCALCITONIN: Procalcitonin: 0.13 ng/mL

## 2015-12-17 LAB — LACTIC ACID, PLASMA
Lactic Acid, Venous: 0.9 mmol/L (ref 0.5–2.0)
Lactic Acid, Venous: 0.9 mmol/L (ref 0.5–2.0)

## 2015-12-17 LAB — INFLUENZA PANEL BY PCR (TYPE A & B)
H1N1FLUPCR: NOT DETECTED
Influenza A By PCR: NEGATIVE
Influenza B By PCR: NEGATIVE

## 2015-12-17 LAB — STREP PNEUMONIAE URINARY ANTIGEN: STREP PNEUMO URINARY ANTIGEN: NEGATIVE

## 2015-12-17 LAB — PROTIME-INR
INR: 1.54 — ABNORMAL HIGH (ref 0.00–1.49)
PROTHROMBIN TIME: 18.6 s — AB (ref 11.6–15.2)

## 2015-12-17 LAB — APTT: APTT: 31 s (ref 24–37)

## 2015-12-17 LAB — MRSA PCR SCREENING: MRSA BY PCR: NEGATIVE

## 2015-12-17 NOTE — Evaluation (Signed)
Physical Therapy Evaluation Patient Details Name: Mark [name] MRN: [id number] DOB: May 19, 1941 Today's Date: 12/17/2015   History of Present Illness  75 year old male presented with HCAP. PMHx-COPD on 4 L O2 via nasal cannula, chronic prednisone, A. fib on Eliquis, history of CVA history of spontaneous right-sided pneumothorax 11/20/15 chest tube, remote history of smoking, chronic diastolic CHF  Clinical Impression  Pt admitted with above diagnosis. Patient with fall at home on day of admission. States he fell after getting up from using the bathroom. Denies dizziness, "I don't know what happened." Pt currently with functional limitations due to the deficits listed below (see PT Problem List).  Pt will benefit from skilled PT to increase their independence and safety with mobility to allow discharge to the venue listed below.       Follow Up Recommendations Home health PT;Supervision for mobility/OOB    Equipment Recommendations  None recommended by PT    Recommendations for Other Services       Precautions / Restrictions Precautions Precautions: Fall Precaution Comments: fell getting up from [facility] with wife's help (at home, day of adm) Restrictions Weight Bearing Restrictions: No      Mobility  Bed Mobility Overal bed mobility: Modified Independent Bed Mobility: Supine to Sit     Supine to sit: Modified independent (Device/Increase time);HOB elevated (with rail)        Transfers Overall transfer level: Needs assistance Equipment used: None Transfers: Stand Pivot Transfers Sit to Stand: Min guard Stand pivot transfers: Min guard       General transfer comment: x3 (including trip to [facility]); steady, waits a few seconds before beginning to step/pivot   Ambulation/Gait Ambulation/Gait assistance: Min guard Ambulation Distance (Feet): 3 Feet Assistive device: None Gait Pattern/deviations: Step-to pattern     General Gait Details: limited by dyspnea and  fatigue  Stairs            Wheelchair Mobility    Modified Rankin (Stroke Patients Only)       Balance Overall balance assessment: History of Falls;Needs assistance Sitting-balance support: No upper extremity supported;Feet supported Sitting balance-Leahy Scale: Good     Standing balance support: No upper extremity supported Standing balance-Leahy Scale: Fair                               Pertinent Vitals/Pain SaO2 93-97% on 5L with pt taking frequent rest breaks due to dyspnea  Pain Assessment: No/denies pain    Home Living Family/patient expects to be discharged to:: Private residence Living Arrangements: Spouse/significant other;Other relatives (?care for 1 yr old great-grandson) Available Help at Discharge: Available 24 hours/day;Family;Friend(s);Personal care attendant (wife has someone with him if she needs to go out) Type of Home: House Home Access: Stairs to enter Entrance Stairs-Rails: None Entrance Stairs-Number of Steps: 1 Home Layout: One level Home Equipment: Shower seat;Wheelchair - power;Wheelchair - manual;Grab bars - tub/shower;Walker - 2 wheels;Cane - single point Additional Comments: Per patient, able to walk about 50 feet before getting SOB; no longer takes showers as he gets too short of breath.    Prior Function Level of Independence: Independent         Comments: reports he occasionally uses cane or RW in house     Hand Dominance        Extremity/Trunk Assessment   Upper Extremity Assessment: Overall WFL for tasks assessed           Lower Extremity Assessment: Generalized weakness (  heavy use of UEs to initiate sit to stan)         Communication   Communication: No difficulties  Cognition Arousal/Alertness: Awake/alert Behavior During Therapy: WFL for tasks assessed/performed Overall Cognitive Status: No family/caregiver present to determine baseline cognitive functioning                       General Comments      Exercises        Assessment/Plan    PT Assessment Patient needs continued PT services  PT Diagnosis Generalized weakness   PT Problem List Decreased strength;Decreased activity tolerance;Decreased balance;Decreased mobility;Cardiopulmonary status limiting activity;Decreased knowledge of use of DME  PT Treatment Interventions DME instruction;Gait training;Functional mobility training;Therapeutic activities;Therapeutic exercise;Balance training;Patient/family education   PT Goals (Current goals can be found in the Care Plan section) Acute Rehab PT Goals Patient Stated Goal: to go home PT Goal Formulation: With patient Time For Goal Achievement: 12/31/15 Potential to Achieve Goals: Fair    Frequency Min 3X/week   Barriers to discharge        Co-evaluation               End of Session Equipment Utilized During Treatment: Oxygen Activity Tolerance: Patient limited by fatigue Patient left: in chair;with call bell/phone within reach;with chair alarm set Nurse Communication: Mobility status         Time: [id number] PT Time Calculation (min) (ACUTE ONLY): 28 min   Charges:   PT Evaluation $PT Eval Moderate Complexity: 1 Procedure PT Treatments $Therapeutic Activity: 8-22 mins   PT G Codes:        Mark [name] 12/17/2015, 10:21 AM Pager [phone]

## 2015-12-17 NOTE — Progress Notes (Signed)
CRITICAL VALUE ALERT  Critical value received: ABG gas Co2-60  Date of notification:  04/24  Time of notification:  1236  Critical value read back: yes  Nurse who received alert:  Kizzie BaneAmy Jahniah Pallas, RN  MD notified (1st page):  Claiborne Billingsallahan  Time of first page:  1237  Responding MD:  Claiborne Billingsallahan  Time MD responded:  [phone]

## 2015-12-17 NOTE — Progress Notes (Signed)
Occupational Therapy Evaluation Patient Details Name: Mark [name] MRN: [id number] DOB: 10/13/1940 Today's Date: 12/17/2015    History of Present Illness 75 year old male presented with HCAP. PMHx-COPD on 4 L O2 via nasal cannula, chronic prednisone, A. fib on Eliquis, history of CVA history of spontaneous right-sided pneumothorax 11/20/15 chest tube, remote history of smoking, chronic diastolic CHF   Clinical Impression   PTA, pt lived at home with wife and was mod I with mobility and ADL. Wife assisted as needed with ADL. Currently requires Mod A with LB ADL and min A with mobility @ RW level.  Dyspnea 3/4 with minimal activity. Pt able to ambulate @ 12 steps @ RW level with O2 desat to 86 on 4L, RR 30. Rebound @ 1 min to O2 95; RR 22 with pursed lip breathing. Pt presents with below deficits and will benefit from acute OT to maximize functional level of independence and complete family education to reduce burden of care. Recommend pt follow up with HHOT and have a HH Aide. Will follow acutely to address established goals.     Follow Up Recommendations  Home health OT;Supervision/Assistance - 24 hour;Other (comment) [facility](HH Aide)    Equipment Recommendations  None recommended by OT    Recommendations for Other Services       Precautions / Restrictions Precautions Precautions: Fall Precaution Comments[facility] (at home, day of adm)      Mobility Bed Mobility Overal bed mobility: Modified Independent Bed Mobility: Supine to Sit     Supine to sit: Modified independent (Device/Increase time);HOB elevated (with rail)        Transfers Overall transfer level: Needs assistance Equipment used: None   Sit to Stand: Min guard Stand pivot transfers: Min A - unsteady - states he feels weaker this pm            Balance     Sitting balance-Leahy Scale: Good       Standing balance-Leahy Scale: Fair                               ADL Overall ADL's : Needs assistance/impaired     Grooming: Set up;Sitting   Upper Body Bathing: Sitting;Minimal assitance Upper Body Bathing Details (indicate cue type and reason): due to fatigue Lower Body Bathing: Minimal assistance;Sit to/from stand Lower Body Bathing Details (indicate cue type and reason): with increased time     Lower Body Dressing: Moderate assistance;Sit to/from stand   Toilet Transfer: Minimal assistance;BSC;RW   Toileting- Clothing Manipulation and Hygiene: Minimal assistance;Sit to/from stand       Functional mobility during ADLs: Min guard;Rolling walker (12 steps) General ADL Comments: Pt stated he was having more difficulty breathing this pm after having a BM earlier. Educated pt/wife on role of OT with pulmonary pts and wife discussed how she's not sure how to help him at times. Began edcuating on activity modification.   Pt states he is fearful of getting in the shower for fear of falling and not being able to breath.     Vision     Perception     Praxis      Pertinent Vitals/Pain Pain Assessment: Faces Faces Pain Scale: Hurts a little bit Pain Location: breathing Pain Descriptors / Indicators: Burning Pain Intervention(s): Limited activity within patient's tolerance     Hand Dominance Right   Extremity/Trunk Assessment Upper Extremity Assessment Upper Extremity Assessment: Generalized  weakness   Lower Extremity Assessment Lower Extremity Assessment: Defer to PT evaluation   Cervical / Trunk Assessment Cervical / Trunk Assessment: Normal   Communication Communication Communication: No difficulties   Cognition Arousal/Alertness: Awake/alert Behavior During Therapy: WFL for tasks assessed/performed Overall Cognitive Status: History of cognitive impairments - at baseline                     General Comments       Exercises       Shoulder Instructions      Home Living Family/patient expects to be discharged  to:: Private residence Living Arrangements: Spouse/significant other;Other relatives (?care for 1 yr old great-grandson) Available Help at Discharge: Available 24 hours/day;Family;Friend(s);Personal care attendant (wife has someone with him if she needs to go out) Type of Home: House Home Access: Stairs to enter [employer] of Steps: 1 Entrance Stairs-Rails: None Home Layout: One level     Bathroom Shower/Tub: Walk-in shower;Door   [employer] Toilet: Handicapped height Bathroom Accessibility: Yes How Accessible: Accessible via walker Home Equipment: Shower seat;Wheelchair - power;Wheelchair - manual;Grab bars - tub/shower;Walker - 2 wheels;Cane - single point;Bedside commode   Additional Comments: Per patient, able to walk about 50 feet before getting SOB; no longer takes showers as he gets too short of breath.      Prior Functioning/Environment Level of Independence: Independent;Needs assistance    ADL's / Homemaking Assistance Needed: wife assists with ADL as needed   Comments: reports he occasionally uses cane or RW in house    OT Diagnosis: Generalized weakness   OT Problem List: Decreased strength;Decreased activity tolerance;Impaired balance (sitting and/or standing);Decreased safety awareness;Decreased knowledge of use of DME or AE;Cardiopulmonary status limiting activity;Pain   OT Treatment/Interventions: Self-care/ADL training;Therapeutic exercise;Energy conservation;DME and/or AE instruction;Therapeutic activities;Patient/family education;Balance training    OT Goals(Current goals can be found in the care plan section) Acute Rehab OT Goals Patient Stated Goal: to be able to breath during self care OT Goal Formulation: With patient Time For Goal Achievement: 12/31/15 Potential to Achieve Goals: Good ADL Goals Pt Will Transfer to Toilet: with supervision;ambulating;bedside commode (with caregiver) Pt Will Perform Toileting - Clothing Manipulation and hygiene:  with modified independence;sit to/from stand;sitting/lateral leans Additional ADL Goal #1: Pt will demonstrate pursed lip breathing technique during funcitonal tasks with min vc Additional ADL Goal #2: Pt will verbalize 3 energy conservation techniques for ADL Additional ADL Goal #3: Pt will verbalize understadning regarding the use of AE for LB ADL  OT Frequency: Min 2X/week   Barriers to D/C:            Co-evaluation              End of Session Equipment Utilized During Treatment: Gait belt;Rolling walker;Oxygen (4L) Nurse Communication: Mobility status  Activity Tolerance: Patient limited by fatigue Patient left: in chair;with call bell/phone within reach;with family/visitor present   Time: 1610-9604 OT Time Calculation (min): 41 min Charges:  OT General Charges $OT Visit: 1 Procedure OT Evaluation $OT Eval Moderate Complexity: 1 Procedure OT Treatments $Self Care/Home Management : 23-37 mins G-Codes:    Proctor Carriker,HILLARY 2015/12/19, 5:11 PM   [facility], OTR/L  [phone] December 19, 2015 Latisha Lasch, OTR/L  [phone]

## 2015-12-17 NOTE — Progress Notes (Signed)
PROGRESS NOTE    Mark [name]  ZOX:[id number] DOB: 02/05/1941 DOA: 12/16/2015 PCP: Kaleen MaskELKINS,WILSON OLIVER, MD   Outpatient Specialists: Dr. Fannie Kneelint Young (Pulm)     Brief Narrative:  Mark [name] is a 75 y.o. male with medical history significant of COPD on 4 L oxygen at home, hyperlipidemia, GERD, atrial fibrillation on Eliquis, stroke, who presents with cough, fever and shortness of breath.  Patient was recently hospitalized from 3/28 to 4/5 due to spontaneous pneumothorax, his chest tube was removed on 11/24/15. He has been doing fine until today when he developed fever, worsening short of breath and cough.     Assessment & Plan:   Principal Problem:   HCAP (healthcare-associated pneumonia) Active Problems:   HLD (hyperlipidemia)   COPD with chronic bronchitis-oxygen dependent   History of CVA (cerebrovascular accident) 5/16   Essential hypertension   Acute respiratory failure with hypoxia (HCC)   COPD exacerbation (HCC)   Chronic anticoagulation   Hypokalemia   Acute respiratory failure with hypoxia due to combination of possible HCAP and COPD exacerbation:  -get 2 view x ray in AM -SDU for now -Nebulizers: scheduled Duoneb and prn albuterol -Solu-Medrol 60 mg IV tid -IV vancomycin and cefepime -Mucinex for cough  -Urine S. Pneumococcal negative and Legionella antigen - blood culture x2, sputum culture, respiratory virus panel, Flu pcr negative -keep O 2 sat 90-92%- no higher  History of CVA (cerebrovascular accident): -on Eliquis and lipitor  HTN: -Hold lasix since pt is at risk of developing sepsis -continue Alfuzosin, Cardizem  GERD: -Protonix  Atrial Fibrillation: CHA2DS2-VASc Score is 4, needs oral anticoagulation. Patient is on Eliquis at home. Heart rate is well controlled. -continue Eliquis -continue propranolol and Cardizem  Hypokalemia: K=3.4 on admission. - Repleted - Mg level ok   DVT prophylaxis:  eliquis  Code Status: Full  Code   Family Communication: Wife at bedside  Disposition Plan:     Consultants:     Procedures:       Subjective: Short of breath after getting to bedside commode  Objective: Filed Vitals:   12/17/15 0740 12/17/15 0848 12/17/15 1118 12/17/15 1149  BP:  103/60  129/90  Pulse:  69  84  Temp:  97 F (36.1 C)  97.6 F (36.4 C)  TempSrc:  Axillary  Oral  Resp:  22  20  Height:      Weight:      SpO2: 98% 97% 98% 97%    Intake/Output Summary (Last 24 hours) at 12/17/15 1231 Last data filed at 12/17/15 1115  Gross per 24 hour  Intake   2270 ml  Output    200 ml  Net   2070 ml   Filed Weights   12/16/15 2337  Weight: 76.7 kg (169 lb 1.5 oz)    Examination:  General exam: ill appearing Respiratory system: Coarse breath sounds, no wheezing Cardiovascular system: S1 & S2 heard, RRR. No JVD, murmurs, rubs, gallops or clicks Gastrointestinal system: Abdomen is nondistended, soft and nontender. No organomegaly or masses felt. Normal bowel sounds heard. Central nervous system: Alert and oriented. No focal neurological deficits. Extremities: Symmetric 5 x 5 power. Skin: No rashes, lesions or ulcers Psychiatry: Judgement and insight appear normal. Mood & affect appropriate.     Data Reviewed: I have personally reviewed following labs and imaging studies  CBC:  Recent Labs Lab 12/16/15 2057  WBC 9.0  NEUTROABS 5.6  HGB 11.9*  HCT 39.8  MCV 100.0  PLT 239   Basic  Metabolic Panel:  Recent Labs Lab 12/16/15 2057 12/17/15 0203  NA 139  --   K 3.4*  --   CL 93*  --   CO2 32  --   GLUCOSE 142*  --   BUN 7  --   CREATININE 0.83  --   CALCIUM 8.7*  --   MG  --  2.1   GFR: Estimated Creatinine Clearance: 81.9 mL/min (by C-G formula based on Cr of 0.83). Liver Function Tests: No results for input(s): AST, ALT, ALKPHOS, BILITOT, PROT, ALBUMIN in the last 168 hours. No results for input(s): LIPASE, AMYLASE in the last 168 hours. No results for  input(s): AMMONIA in the last 168 hours. Coagulation Profile:  Recent Labs Lab 12/16/15 2340  INR 1.54*   Cardiac Enzymes: No results for input(s): CKTOTAL, CKMB, CKMBINDEX, TROPONINI in the last 168 hours. BNP (last 3 results) No results for input(s): PROBNP in the last 8760 hours. HbA1C: No results for input(s): HGBA1C in the last 72 hours. CBG:  Recent Labs Lab 12/17/15 0106 12/17/15 0838 12/17/15 1148  GLUCAP 144* 163* 272*   Lipid Profile: No results for input(s): CHOL, HDL, LDLCALC, TRIG, CHOLHDL, LDLDIRECT in the last 72 hours. Thyroid Function Tests: No results for input(s): TSH, T4TOTAL, FREET4, T3FREE, THYROIDAB in the last 72 hours. Anemia Panel: No results for input(s): VITAMINB12, FOLATE, FERRITIN, TIBC, IRON, RETICCTPCT in the last 72 hours. Urine analysis:    Component Value Date/Time   COLORURINE YELLOW 12/16/2015 2124   APPEARANCEUR CLEAR 12/16/2015 2124   LABSPEC 1.011 12/16/2015 2124   PHURINE 5.5 12/16/2015 2124   GLUCOSEU NEGATIVE 12/16/2015 2124   HGBUR LARGE* 12/16/2015 2124   BILIRUBINUR NEGATIVE 12/16/2015 2124   KETONESUR NEGATIVE 12/16/2015 2124   PROTEINUR NEGATIVE 12/16/2015 2124   UROBILINOGEN 0.2 10/30/2012 1429   NITRITE NEGATIVE 12/16/2015 2124   LEUKOCYTESUR NEGATIVE 12/16/2015 2124    Recent Results (from the past 240 hour(s))  MRSA PCR Screening     Status: None   Collection Time: 12/17/15  1:22 AM  Result Value Ref Range Status   MRSA by PCR NEGATIVE NEGATIVE Final    Comment:        The GeneXpert MRSA Assay (FDA approved for NASAL specimens only), is one component of a comprehensive MRSA colonization surveillance program. It is not intended to diagnose MRSA infection nor to guide or monitor treatment for MRSA infections.       Anti-infectives    Start     Dose/Rate Route Frequency Ordered Stop   12/17/15 1000  vancomycin (VANCOCIN) IVPB 1000 mg/200 mL premix     1,000 mg 200 mL/hr over 60 Minutes Intravenous  Every 12 hours 12/16/15 2108     12/17/15 0600  ceFEPIme (MAXIPIME) 1 g in dextrose 5 % 50 mL IVPB     1 g 100 mL/hr over 30 Minutes Intravenous Every 8 hours 12/16/15 2240 12/25/15 0559   12/16/15 2130  vancomycin (VANCOCIN) 1,500 mg in sodium chloride 0.9 % 500 mL IVPB     1,500 mg 250 mL/hr over 120 Minutes Intravenous  Once 12/16/15 2108 12/16/15 2349   12/16/15 2100  ceFEPIme (MAXIPIME) 2 g in dextrose 5 % 50 mL IVPB     2 g 100 mL/hr over 30 Minutes Intravenous  Once 12/16/15 2055 12/16/15 2234       Radiology Studies: Dg Chest Port 1 View  12/16/2015  CLINICAL DATA:  Shortness of breath and fever. EXAM: PORTABLE CHEST 1 VIEW COMPARISON:  12/05/2015  chest radiograph. FINDINGS: Stable cardiomediastinal silhouette with normal heart size. No pneumothorax. Stable blunting of the right costophrenic angle, probably chronic pleural-parenchymal scarring. No definite pleural effusion. Advanced emphysema. Multiple surgical sutures throughout the right lung. Hazy opacity at the left lung base appears slightly increased. No pulmonary edema. IMPRESSION: 1. Hazy left lung base opacity, favor scarring or atelectasis, difficult to exclude a developing pneumonia. Consider further evaluation with PA and lateral chest radiographs. 2. Advanced emphysema. Electronically Signed   By: Delbert Phenix M.D.   On: 12/16/2015 21:19        Scheduled Meds: . alfuzosin  10 mg Oral Daily  . apixaban  5 mg Oral BID  . atorvastatin  20 mg Oral Daily  . calcium-vitamin D  1 tablet Oral BID WC  . ceFEPime (MAXIPIME) IV  1 g Intravenous Q8H  . diltiazem  240 mg Oral Daily  . insulin aspart  0-5 Units Subcutaneous QHS  . insulin aspart  0-9 Units Subcutaneous TID WC  . ipratropium-albuterol  3 mL Nebulization Q4H  . methylPREDNISolone (SOLU-MEDROL) injection  60 mg Intravenous TID  . mirabegron ER  50 mg Oral Daily  . multivitamin with minerals  1 tablet Oral Daily  . pantoprazole  40 mg Oral Daily  . propafenone   225 mg Oral BID  . vancomycin  1,000 mg Intravenous Q12H   Continuous Infusions: . sodium chloride 1,000 mL (12/17/15 1059)     LOS: 1 day    Time spent: 35 min    Kamie Korber U Jillyn Stacey, DO [facility] Pager [phone]  If 7PM-7AM, please contact night-coverage [email or web address] Password [facility] 12/17/2015, 12:31 PM

## 2015-12-17 NOTE — Progress Notes (Signed)
[facility]  Patient Status: Active (receiving services up to time of hospitalization)  [facility] is providing the following services: RN, PT and HHA  If patient discharges after hours, please call [phone].   Sherryll BurgerStephanie M George 12/17/2015, 11:43 AM

## 2015-12-18 ENCOUNTER — Inpatient Hospital Stay (HOSPITAL_COMMUNITY): Payer: Medicare Other

## 2015-12-18 LAB — CBC
HCT: 32.8 % — ABNORMAL LOW (ref 39.0–52.0)
Hemoglobin: 9.8 g/dL — ABNORMAL LOW (ref 13.0–17.0)
MCH: 29.7 pg (ref 26.0–34.0)
MCHC: 29.9 g/dL — AB (ref 30.0–36.0)
MCV: 99.4 fL (ref 78.0–100.0)
PLATELETS: 224 10*3/uL (ref 150–400)
RBC: 3.3 MIL/uL — ABNORMAL LOW (ref 4.22–5.81)
RDW: 15.4 % (ref 11.5–15.5)
WBC: 6.6 10*3/uL (ref 4.0–10.5)

## 2015-12-18 LAB — BASIC METABOLIC PANEL
Anion gap: 11 (ref 5–15)
BUN: 12 mg/dL (ref 6–20)
CALCIUM: 8.6 mg/dL — AB (ref 8.9–10.3)
CHLORIDE: 97 mmol/L — AB (ref 101–111)
CO2: 28 mmol/L (ref 22–32)
CREATININE: 0.88 mg/dL (ref 0.61–1.24)
GFR calc Af Amer: >60 mL/min (ref >60)
Glucose, Bld: 267 mg/dL — ABNORMAL HIGH (ref 65–99)
Potassium: 3.7 mmol/L (ref 3.5–5.1)
Sodium: 136 mmol/L (ref 135–145)

## 2015-12-18 LAB — GLUCOSE, CAPILLARY
GLUCOSE-CAPILLARY: 174 mg/dL — AB (ref 65–99)
GLUCOSE-CAPILLARY: 167 mg/dL — AB (ref 65–99)
GLUCOSE-CAPILLARY: 181 mg/dL — AB (ref 65–99)
GLUCOSE-CAPILLARY: 93 mg/dL (ref 65–99)
Glucose-Capillary: 223 mg/dL — ABNORMAL HIGH (ref 65–99)

## 2015-12-18 LAB — RESPIRATORY VIRUS PANEL
Adenovirus: NEGATIVE
INFLUENZA B 1: NEGATIVE
Influenza A: NEGATIVE
Metapneumovirus: NEGATIVE
PARAINFLUENZA 1 A: NEGATIVE
Parainfluenza 2: NEGATIVE
Parainfluenza 3: POSITIVE — AB
RESPIRATORY SYNCYTIAL VIRUS A: NEGATIVE
RESPIRATORY SYNCYTIAL VIRUS B: NEGATIVE
Rhinovirus: NEGATIVE

## 2015-12-18 LAB — LEGIONELLA PNEUMOPHILA SEROGP 1 UR AG: L. pneumophila Serogp 1 Ur Ag: NEGATIVE

## 2015-12-18 MED ORDER — METHYLPREDNISOLONE SODIUM SUCC 40 MG IJ SOLR
40.0000 mg | Freq: Three times a day (TID) | INTRAMUSCULAR | Status: DC
  Administered 2015-12-18 – 2015-12-19 (×4): 40 mg via INTRAVENOUS
  Filled 2015-12-18 (×4): qty 1

## 2015-12-18 MED ORDER — DIPHENHYDRAMINE HCL 50 MG/ML IJ SOLN
25.0000 mg | Freq: Once | INTRAMUSCULAR | Status: DC
  Filled 2015-12-18: qty 1

## 2015-12-18 MED ORDER — INSULIN ASPART 100 UNIT/ML ~~LOC~~ SOLN
0.0000 [IU] | Freq: Three times a day (TID) | SUBCUTANEOUS | Status: DC
  Administered 2015-12-18 (×2): 4 [IU] via SUBCUTANEOUS
  Administered 2015-12-19: 2 [IU] via SUBCUTANEOUS
  Administered 2015-12-19 (×2): 4 [IU] via SUBCUTANEOUS
  Administered 2015-12-20: 3 [IU] via SUBCUTANEOUS
  Administered 2015-12-20: 4 [IU] via SUBCUTANEOUS

## 2015-12-18 MED ORDER — DILTIAZEM HCL ER COATED BEADS 180 MG PO CP24
180.0000 mg | ORAL_CAPSULE | Freq: Every day | ORAL | Status: DC
  Administered 2015-12-18 – 2015-12-20 (×3): 180 mg via ORAL
  Filled 2015-12-18 (×3): qty 1

## 2015-12-18 MED ORDER — IPRATROPIUM-ALBUTEROL 0.5-2.5 (3) MG/3ML IN SOLN
3.0000 mL | Freq: Three times a day (TID) | RESPIRATORY_TRACT | Status: DC
  Administered 2015-12-18 – 2015-12-20 (×8): 3 mL via RESPIRATORY_TRACT
  Filled 2015-12-18 (×8): qty 3

## 2015-12-18 MED ORDER — HYDROCOD POLST-CPM POLST ER 10-8 MG/5ML PO SUER
5.0000 mL | Freq: Every evening | ORAL | Status: DC | PRN
  Administered 2015-12-18 – 2015-12-20 (×2): 5 mL via ORAL
  Filled 2015-12-18 (×4): qty 5

## 2015-12-18 MED ORDER — INSULIN ASPART 100 UNIT/ML ~~LOC~~ SOLN
3.0000 [IU] | Freq: Three times a day (TID) | SUBCUTANEOUS | Status: DC
  Administered 2015-12-18 – 2015-12-20 (×7): 3 [IU] via SUBCUTANEOUS

## 2015-12-18 MED ORDER — INSULIN ASPART 100 UNIT/ML ~~LOC~~ SOLN: 0.0000 [IU] | Freq: Three times a day (TID) | SUBCUTANEOUS | Status: DC

## 2015-12-18 MED ORDER — INSULIN ASPART 100 UNIT/ML ~~LOC~~ SOLN
0.0000 [IU] | Freq: Every day | SUBCUTANEOUS | Status: DC
  Administered 2015-12-18: 2 [IU] via SUBCUTANEOUS

## 2015-12-18 MED ORDER — HYDROCOD POLST-CPM POLST ER 10-8 MG/5ML PO SUER
5.0000 mL | Freq: Once | ORAL | Status: AC
  Administered 2015-12-18: 5 mL via ORAL
  Filled 2015-12-18: qty 5

## 2015-12-18 NOTE — Progress Notes (Signed)
Inpatient Diabetes Program Recommendations  AACE/ADA: New Consensus Statement on Inpatient Glycemic Control (2015)  Target Ranges:  Prepandial:   less than 140 mg/dL      Peak postprandial:   less than 180 mg/dL (1-2 hours)      Critically ill patients:  140 - 180 mg/dL   Results for Orlie DakinFIELDS, Manny J (MRN [id number]) as of 12/18/2015 09:24  Ref. Range 12/17/2015 08:38 12/17/2015 11:48 12/17/2015 17:11 12/17/2015 21:49  Glucose-Capillary Latest Ref Range: 65-99 mg/dL 130163 (H) 865272 (H) 784140 (H) 220 (H)   Results for Orlie DakinFIELDS, Donyell J (MRN [id number]) as of 12/18/2015 09:24  Ref. Range 12/18/2015 08:13  Glucose-Capillary Latest Ref Range: 65-99 mg/dL 132174 (H)    Admit: Pneumonia/ COPD  Current Insulin Orders: Novolog Resistant Correction Scale/ SSI (0-20 units) TID AC + HS     MD- Note patient receiving IV Solumedrol 40 mg Q8 hours.  Please consider the following in-hospital insulin adjustments while patient getting steroids:  1. Start low dose basal insulin- Lantus 8 units daily (0.1 units/kg dosing)  2. Start low dose Novolog Meal Coverage- Novolog 3 units tid with meals (hold if pt eats <50% of meal)      --Will follow patient during hospitalization--  Ambrose FinlandJeannine Johnston Salvadore Valvano RN, MSN, CDE Diabetes Coordinator Inpatient Glycemic Control Team Team Pager: [phone] (8a-5p)

## 2015-12-18 NOTE — Progress Notes (Addendum)
Physical Therapy Treatment Patient Details Name: Mark [name] MRN: [id number] DOB: 1941/07/08 Today's Date: 12/18/2015    History of Present Illness 75 year old male presented with HCAP. PMHx-COPD on 4 L O2 via nasal cannula, chronic prednisone, A. fib on Eliquis, history of CVA history of spontaneous right-sided pneumothorax 11/20/15 chest tube, remote history of smoking, chronic diastolic CHF    PT Comments    Patient continues to need frequent rest breaks due to dyspnea, however he was able to maintain SaO2 >92% (most of the time >94%) while mobilizing OOB and walking (18 ft, then 24 ft). Continues to require cues for slowing his pace and using pursed lip breathing.    Follow Up Recommendations  Home health PT;Supervision for mobility/OOB     Equipment Recommendations  None recommended by PT    Recommendations for Other Services       Precautions / Restrictions Precautions Precautions: Fall Precaution Comments: fell getting up from [facility] with wife's help (at home, day of adm)    Mobility  Bed Mobility Overal bed mobility: Modified Independent Bed Mobility: Supine to Sit;Sit to Supine     Supine to sit: Modified independent (Device/Increase time);HOB elevated (with rail) Sit to supine: Modified independent (Device/Increase time)      Transfers Overall transfer level: Needs assistance Equipment used: Rolling walker (2 wheeled) Transfers: Sit to/from Stand Sit to Stand: Min guard         General transfer comment: x 2 from bed with vc for safe use of RW; no physical assist  Ambulation/Gait Ambulation/Gait assistance: Min guard Ambulation Distance (Feet): 18 Feet (seated rest; 24) Assistive device: Rolling walker (2 wheeled) Gait Pattern/deviations: Step-through pattern;Decreased stride length;Trunk flexed Gait velocity: decreased.    General Gait Details: encouraged pt to maintain slow pace and stop for pursed lip breathing, if needed (he did not stop as dyspnea  increased); educated on benefits of using RW related to respiration (provides fixed place for reverse action of pecs to open upper lungs). He reports there is plenty of room to use a RW at home, he is just afraid of getting O2 tubing entangled with RW and trip/fall.   Stairs            Wheelchair Mobility    Modified Rankin (Stroke Patients Only)       Balance     Sitting balance-Leahy Scale: Good       Standing balance-Leahy Scale: Fair                      Cognition Arousal/Alertness: Awake/alert Behavior During Therapy: WFL for tasks assessed/performed Overall Cognitive Status: No family/caregiver present to determine baseline cognitive functioning                      Exercises      General Comments General comments (skin integrity, edema, etc.): patient able to verbalize effectiveness of pursed lip breathing, however he did not independently use this strategy as he fatigued.       Pertinent Vitals/Pain Pain Assessment: Faces Faces Pain Scale: Hurts little more Pain Location: ribs Pain Descriptors / Indicators: Grimacing;Guarding Pain Intervention(s): Limited activity within patient's tolerance;Monitored during session;Premedicated before session;Repositioned;Other (comment) (encouraged use of pillow for splinting when coughing)    Home Living                      Prior Function            PT Goals (current  goals can now be found in the care plan section) Acute Rehab PT Goals Patient Stated Goal: to go home Time For Goal Achievement: 12/31/15 Progress towards PT goals: Progressing toward goals    Frequency  Min 3X/week    PT Plan Current plan remains appropriate    Co-evaluation             End of Session Equipment Utilized During Treatment: Oxygen;Gait belt Activity Tolerance: Patient limited by fatigue Patient left: with call bell/phone within reach;in bed;with family/visitor present     Time: [id number] PT  Time Calculation (min) (ACUTE ONLY): 38 min  Charges:  $Gait Training: 23-37 mins $Therapeutic Activity: 8-22 mins                    G Codes:      Tony Granquist 12/18/2015, 11:30 AM Pager [phone]

## 2015-12-18 NOTE — Progress Notes (Addendum)
PROGRESS NOTE    Mark [name]  ZOX:[id number] DOB: Jul 29, 1941 DOA: 12/16/2015 PCP: [name], MD   Outpatient Specialists: Dr. Fannie Knee ([facility])     Brief Narrative:  Mark [name] is a 75 y.o. male with medical history significant of COPD on 4 L oxygen at home, hyperlipidemia, GERD, atrial fibrillation on Eliquis, stroke, who presents with cough, fever and shortness of breath.  Patient was recently hospitalized from 3/28 to 4/5 due to spontaneous pneumothorax, his chest tube was removed on 11/24/15. He has been doing fine until today when he developed fever, worsening shortness of breath and cough.     Assessment & Plan:   Principal Problem:   HCAP (healthcare-associated pneumonia) Active Problems:   HLD (hyperlipidemia)   COPD with chronic bronchitis-oxygen dependent   History of CVA (cerebrovascular accident) 5/16   Essential hypertension   Acute respiratory failure with hypoxia (HCC)   COPD exacerbation (HCC)   Chronic anticoagulation   Hypokalemia   Acute respiratory failure with hypoxia due to combination of  HCAP and COPD exacerbation:  -Nebulizers: scheduled Duoneb and prn albuterol -Solu-Medrol- wean as tolerated -IV vancomycin and cefepime -Mucinex for cough  -Urine S. Pneumococcal negative and Legionella antigen - blood culture x2, sputum culture, respiratory virus panel, Flu pcr negative -keep O 2 sat 90-92%- no higher  Hyperglycemia -SSI- resistant -may need low dose lantus while on steroids -novolog with meals as well  History of CVA (cerebrovascular accident): -on Eliquis and lipitor  HTN: -Hold lasix since pt is at risk of developing sepsis -continue Alfuzosin, Cardizem  GERD: -Protonix  Atrial Fibrillation: CHA2DS2-VASc Score is 4, needs oral anticoagulation. Patient is on Eliquis at home. Heart rate is well controlled. -continue Eliquis -continue propranolol and Cardizem  Hypokalemia: K=3.4 on admission. - Repleted - Mg level  ok   -add TED hose and decrease Cardizem as goal BP is > 120 for brain perfusion per Dr. Roda Shutters   DVT prophylaxis:  eliquis  Code Status: Full Code   Family Communication: Wife at bedside  Disposition Plan:  PT eval-- home health -tx to floor -suspect 2-3 more days in hospital   Consultants:     Procedures:       Subjective: C/o cough that kept him up at night and caused pain  Objective: Filed Vitals:   12/18/15 0400 12/18/15 0600 12/18/15 0733 12/18/15 0815  BP: 102/64 108/63  118/60  Pulse: 86 85  83  Temp: 97.5 F (36.4 C)   97.6 F (36.4 C)  TempSrc: Oral   Oral  Resp: Height:      Weight:      SpO2: 97% 98% 99% 96%    Intake/Output Summary (Last 24 hours) at 12/18/15 0855 Last data filed at 12/18/15 0502  Gross per 24 hour  Intake   1655 ml  Output   1801 ml  Net   -146 ml   Filed Weights   12/16/15 2337  Weight: 76.7 kg (169 lb 1.5 oz)    Examination:  General exam: ill appearing- on 4L O2 Respiratory system: moving more air, no wheezing Cardiovascular system: S1 & S2 heard, RRR. No JVD, murmurs, rubs, gallops or clicks Gastrointestinal system: Abdomen is nondistended, soft and nontender. No organomegaly or masses felt. Normal bowel sounds heard. Central nervous system: Alert and oriented. No focal neurological deficits. Extremities: Symmetric 5 x 5 power. Skin: No rashes, lesions or ulcers Psychiatry: Judgement and insight appear normal. Mood & affect appropriate.  Data Reviewed: I have personally reviewed following labs and imaging studies  CBC:  Recent Labs Lab 12/16/15 2057 12/17/15 2338  WBC 9.0 6.6  NEUTROABS 5.6  --   HGB 11.9* 9.8*  HCT 39.8 32.8*  MCV 100.0 99.4  PLT 239 224   Basic Metabolic Panel:  Recent Labs Lab 12/16/15 2057 12/17/15 0203 12/17/15 2338  NA 139  --  136  K 3.4*  --  3.7  CL 93*  --  97*  CO2 32  --  28  GLUCOSE 142*  --  267*  BUN 7  --  12  CREATININE 0.83  --  0.88   CALCIUM 8.7*  --  8.6*  MG  --  2.1  --    GFR: Estimated Creatinine Clearance: 77.2 mL/min (by C-G formula based on Cr of 0.88). Liver Function Tests: No results for input(s): AST, ALT, ALKPHOS, BILITOT, PROT, ALBUMIN in the last 168 hours. No results for input(s): LIPASE, AMYLASE in the last 168 hours. No results for input(s): AMMONIA in the last 168 hours. Coagulation Profile:  Recent Labs Lab 12/16/15 2340  INR 1.54*   Cardiac Enzymes: No results for input(s): CKTOTAL, CKMB, CKMBINDEX, TROPONINI in the last 168 hours. BNP (last 3 results) No results for input(s): PROBNP in the last 8760 hours. HbA1C: No results for input(s): HGBA1C in the last 72 hours. CBG:  Recent Labs Lab 12/17/15 0838 12/17/15 1148 12/17/15 1711 12/17/15 2149 12/18/15 0813  GLUCAP 163* 272* 140* 220* 174*   Lipid Profile: No results for input(s): CHOL, HDL, LDLCALC, TRIG, CHOLHDL, LDLDIRECT in the last 72 hours. Thyroid Function Tests: No results for input(s): TSH, T4TOTAL, FREET4, T3FREE, THYROIDAB in the last 72 hours. Anemia Panel: No results for input(s): VITAMINB12, FOLATE, FERRITIN, TIBC, IRON, RETICCTPCT in the last 72 hours. Urine analysis:    Component Value Date/Time   COLORURINE YELLOW 12/16/2015 2124   APPEARANCEUR CLEAR 12/16/2015 2124   LABSPEC 1.011 12/16/2015 2124   PHURINE 5.5 12/16/2015 2124   GLUCOSEU NEGATIVE 12/16/2015 2124   HGBUR LARGE* 12/16/2015 2124   BILIRUBINUR NEGATIVE 12/16/2015 2124   KETONESUR NEGATIVE 12/16/2015 2124   PROTEINUR NEGATIVE 12/16/2015 2124   UROBILINOGEN 0.2 10/30/2012 1429   NITRITE NEGATIVE 12/16/2015 2124   LEUKOCYTESUR NEGATIVE 12/16/2015 2124    Recent Results (from the past 240 hour(s))  Urine culture     Status: None   Collection Time: 12/16/15  9:24 PM  Result Value Ref Range Status   Specimen Description URINE, CLEAN CATCH  Final   Special Requests NONE  Final   Culture MULTIPLE SPECIES PRESENT, SUGGEST RECOLLECTION  Final    Report Status 12/17/2015 FINAL  Final  Culture, blood (Routine X 2) w Reflex to ID Panel     Status: None (Preliminary result)   Collection Time: 12/16/15  9:38 PM  Result Value Ref Range Status   Specimen Description BLOOD RIGHT ANTECUBITAL  Final   Special Requests BOTTLES DRAWN AEROBIC AND ANAEROBIC 10CC EA  Final   Culture NO GROWTH < 24 HOURS  Final   Report Status PENDING  Incomplete  Culture, blood (Routine X 2) w Reflex to ID Panel     Status: None (Preliminary result)   Collection Time: 12/16/15  9:48 PM  Result Value Ref Range Status   Specimen Description BLOOD RIGHT FOREARM  Final   Special Requests BOTTLES DRAWN AEROBIC AND ANAEROBIC 5CC EA  Final   Culture NO GROWTH < 24 HOURS  Final   Report Status  PENDING  Incomplete  MRSA PCR Screening     Status: None   Collection Time: 12/17/15  1:22 AM  Result Value Ref Range Status   MRSA by PCR NEGATIVE NEGATIVE Final    Comment:        The GeneXpert MRSA Assay (FDA approved for NASAL specimens only), is one component of a comprehensive MRSA colonization surveillance program. It is not intended to diagnose MRSA infection nor to guide or monitor treatment for MRSA infections.       Anti-infectives    Start     Dose/Rate Route Frequency Ordered Stop   12/17/15 1000  vancomycin (VANCOCIN) IVPB 1000 mg/200 mL premix     1,000 mg 200 mL/hr over 60 Minutes Intravenous Every 12 hours 12/16/15 2108     12/17/15 0600  ceFEPIme (MAXIPIME) 1 g in dextrose 5 % 50 mL IVPB     1 g 100 mL/hr over 30 Minutes Intravenous Every 8 hours 12/16/15 2240 12/25/15 0559   12/16/15 2130  vancomycin (VANCOCIN) 1,500 mg in sodium chloride 0.9 % 500 mL IVPB     1,500 mg 250 mL/hr over 120 Minutes Intravenous  Once 12/16/15 2108 12/16/15 2349   12/16/15 2100  ceFEPIme (MAXIPIME) 2 g in dextrose 5 % 50 mL IVPB     2 g 100 mL/hr over 30 Minutes Intravenous  Once 12/16/15 2055 12/16/15 2234       Radiology Studies: Dg Chest 2  View  12/18/2015  CLINICAL DATA:  Pneumonia. EXAM: CHEST  2 VIEW COMPARISON:  12/16/2015. FINDINGS: Mediastinum hilar structures normal. Surgical sutures are noted throughout the right lung. Left mid lung field mild infiltrate cannot be excluded. Pleural-parenchymal thickening is noted bilaterally particular on the right consistent with scarring. COPD. No pneumothorax. Stable cardiomegaly, no pulmonary venous congestion IMPRESSION: 1. Persistent mild left base atelectasis and/or infiltrate. 2. Postsurgical changes right lung. Bilateral pleural parenchymal scarring right side greater than left. COPD. 3. Stable cardiomegaly. Electronically Signed   By: Maisie Fus  Register   On: 12/18/2015 07:46   Dg Chest Port 1 View  12/16/2015  CLINICAL DATA:  Shortness of breath and fever. EXAM: PORTABLE CHEST 1 VIEW COMPARISON:  12/05/2015 chest radiograph. FINDINGS: Stable cardiomediastinal silhouette with normal heart size. No pneumothorax. Stable blunting of the right costophrenic angle, probably chronic pleural-parenchymal scarring. No definite pleural effusion. Advanced emphysema. Multiple surgical sutures throughout the right lung. Hazy opacity at the left lung base appears slightly increased. No pulmonary edema. IMPRESSION: 1. Hazy left lung base opacity, favor scarring or atelectasis, difficult to exclude a developing pneumonia. Consider further evaluation with PA and lateral chest radiographs. 2. Advanced emphysema. Electronically Signed   By: Delbert Phenix M.D.   On: 12/16/2015 21:19        Scheduled Meds: . alfuzosin  10 mg Oral Daily  . apixaban  5 mg Oral BID  . atorvastatin  20 mg Oral Daily  . calcium-vitamin D  1 tablet Oral BID WC  . ceFEPime (MAXIPIME) IV  1 g Intravenous Q8H  . diltiazem  180 mg Oral Daily  . diphenhydrAMINE  25 mg Intravenous Once  . insulin aspart  0-5 Units Subcutaneous QHS  . insulin aspart  0-9 Units Subcutaneous TID WC  . ipratropium-albuterol  3 mL Nebulization TID  .  methylPREDNISolone (SOLU-MEDROL) injection  40 mg Intravenous TID  . mirabegron ER  50 mg Oral Daily  . multivitamin with minerals  1 tablet Oral Daily  . pantoprazole  40 mg Oral Daily  .  propafenone  225 mg Oral BID  . vancomycin  1,000 mg Intravenous Q12H   Continuous Infusions:     LOS: 2 days    Time spent: 35 min    Mark Juanetta GoslingU VANN, DO [facility] Pager [phone]  If 7PM-7AM, please contact night-coverage [email or web address] Password [email or web address] 12/18/2015, 8:55 AM

## 2015-12-19 DIAGNOSIS — Z8673 Personal history of transient ischemic attack (TIA), and cerebral infarction without residual deficits: Secondary | ICD-10-CM

## 2015-12-19 DIAGNOSIS — I10 Essential (primary) hypertension: Secondary | ICD-10-CM

## 2015-12-19 DIAGNOSIS — J441 Chronic obstructive pulmonary disease with (acute) exacerbation: Secondary | ICD-10-CM

## 2015-12-19 DIAGNOSIS — J189 Pneumonia, unspecified organism: Secondary | ICD-10-CM

## 2015-12-19 DIAGNOSIS — J9601 Acute respiratory failure with hypoxia: Secondary | ICD-10-CM

## 2015-12-19 DIAGNOSIS — E785 Hyperlipidemia, unspecified: Secondary | ICD-10-CM

## 2015-12-19 LAB — BASIC METABOLIC PANEL
Anion gap: 6 (ref 5–15)
BUN: 15 mg/dL (ref 6–20)
CALCIUM: 8.7 mg/dL — AB (ref 8.9–10.3)
CO2: 37 mmol/L — ABNORMAL HIGH (ref 22–32)
CREATININE: 0.66 mg/dL (ref 0.61–1.24)
Chloride: 99 mmol/L — ABNORMAL LOW (ref 101–111)
GFR calc Af Amer: >60 mL/min (ref >60)
GLUCOSE: 162 mg/dL — AB (ref 65–99)
Potassium: 4.5 mmol/L (ref 3.5–5.1)
SODIUM: 142 mmol/L (ref 135–145)

## 2015-12-19 LAB — CBC
HCT: 34.3 % — ABNORMAL LOW (ref 39.0–52.0)
Hemoglobin: 10.2 g/dL — ABNORMAL LOW (ref 13.0–17.0)
MCH: 29.7 pg (ref 26.0–34.0)
MCHC: 29.7 g/dL — ABNORMAL LOW (ref 30.0–36.0)
MCV: 100 fL (ref 78.0–100.0)
PLATELETS: 256 10*3/uL (ref 150–400)
RBC: 3.43 MIL/uL — ABNORMAL LOW (ref 4.22–5.81)
RDW: 15.6 % — AB (ref 11.5–15.5)
WBC: 11 10*3/uL — ABNORMAL HIGH (ref 4.0–10.5)

## 2015-12-19 LAB — GLUCOSE, CAPILLARY
GLUCOSE-CAPILLARY: 150 mg/dL — AB (ref 65–99)
GLUCOSE-CAPILLARY: 198 mg/dL — AB (ref 65–99)
Glucose-Capillary: 197 mg/dL — ABNORMAL HIGH (ref 65–99)
Glucose-Capillary: 183 mg/dL — ABNORMAL HIGH (ref 65–99)

## 2015-12-19 LAB — VANCOMYCIN, TROUGH: Vancomycin Tr: 14 ug/mL (ref 10.0–20.0)

## 2015-12-19 MED ORDER — GUAIFENESIN ER 600 MG PO TB12
1200.0000 mg | ORAL_TABLET | Freq: Two times a day (BID) | ORAL | Status: DC
  Administered 2015-12-19 – 2015-12-20 (×2): 1200 mg via ORAL
  Filled 2015-12-19 (×2): qty 2

## 2015-12-19 MED ORDER — INSULIN GLARGINE 100 UNIT/ML ~~LOC~~ SOLN
4.0000 [IU] | Freq: Every day | SUBCUTANEOUS | Status: DC
  Administered 2015-12-19 – 2015-12-20 (×2): 4 [IU] via SUBCUTANEOUS
  Filled 2015-12-19 (×2): qty 0.04

## 2015-12-19 MED ORDER — METFORMIN HCL 500 MG PO TABS
500.0000 mg | ORAL_TABLET | Freq: Every day | ORAL | Status: DC
  Administered 2015-12-19 – 2015-12-20 (×2): 500 mg via ORAL
  Filled 2015-12-19 (×2): qty 1

## 2015-12-19 MED ORDER — VANCOMYCIN HCL 10 G IV SOLR
1250.0000 mg | Freq: Two times a day (BID) | INTRAVENOUS | Status: DC
  Administered 2015-12-19 – 2015-12-20 (×2): 1250 mg via INTRAVENOUS
  Filled 2015-12-19 (×3): qty 1250

## 2015-12-19 MED ORDER — METHYLPREDNISOLONE SODIUM SUCC 40 MG IJ SOLR
40.0000 mg | Freq: Two times a day (BID) | INTRAMUSCULAR | Status: DC
Start: 2015-12-19 — End: 2015-12-20
  Administered 2015-12-19 – 2015-12-20 (×2): 40 mg via INTRAVENOUS
  Filled 2015-12-19 (×2): qty 1

## 2015-12-19 NOTE — Progress Notes (Signed)
PROGRESS NOTE    Mark DakinGrady J [name]  XBJ:[id number] DOB: 12/21/1940 DOA: 12/16/2015 PCP: Kaleen MaskELKINS,WILSON OLIVER, MD   Outpatient Specialists: Dr. Fannie Kneelint Young ([facility])     Brief Narrative:  Mark DakinGrady J Byers is a 75 y.o. male with medical history significant of COPD on 4 L oxygen at home, hyperlipidemia, GERD, atrial fibrillation on Eliquis, stroke, who presents with cough, fever and shortness of breath.  Patient was recently hospitalized from 3/28 to 4/5 due to spontaneous pneumothorax, his chest tube was removed on 11/24/15. He has been doing fine until today when he developed fever, worsening shortness of breath and cough.     Assessment & Plan:   Principal Problem:   HCAP (healthcare-associated pneumonia) Active Problems:   HLD (hyperlipidemia)   COPD with chronic bronchitis-oxygen dependent   History of CVA (cerebrovascular accident) 5/16   Essential hypertension   Acute respiratory failure with hypoxia (HCC)   COPD exacerbation (HCC)   Chronic anticoagulation   Hypokalemia   Acute respiratory failure with hypoxia due to combination of  HCAP and COPD exacerbation:  -Nebulizers: scheduled Duoneb and prn albuterol -Solu-Medrol- wean as tolerated -IV vancomycin and cefepime -Mucinex for cough  -Urine S. Pneumococcal  and Legionella antigen negative - Parainfluenza 3 positive - blood culture x2, sputum culture, Flu pcr negative - keep O 2 sat 90-92%- no higher  Hyperglycemia -SSI- resistant -We'll start on low-dose Lantus, we'll start with 4 units subcutaneous daily, will monitor closely as lowering his steroid dose -novolog with meals as well  History of CVA (cerebrovascular accident): -on Eliquis and lipitor  HTN: -Hold lasix since pt is at risk of developing sepsis -continue Alfuzosin, Cardizem  GERD: -Protonix  Atrial Fibrillation: CHA2DS2-VASc Score is 4, needs oral anticoagulation. Patient is on Eliquis at home. Heart rate is well controlled. -continue  Eliquis -continue propranolol and Cardizem  Hypokalemia: K=3.4 on admission. - Repleted - Mg level ok   -add TED hose and decrease Cardizem as goal BP is > 120 for brain perfusion per Dr. Roda ShuttersXu   DVT prophylaxis:  eliquis  Code Status: Full Code   Family Communication: none at bedside  Disposition Plan:  PT eval-- home health -tx to floor -suspect 2-3 more days in hospital   Consultants:     Procedures:       Subjective: C/o cough , denies any chest pain,   Objective: Filed Vitals:   12/18/15 2117 12/19/15 0516 12/19/15 0854 12/19/15 0913  BP:  124/66 129/79   Pulse:  76 63   Temp:  97.4 F (36.3 C) 97.3 F (36.3 C)   TempSrc:  Oral Oral   Resp:  18 18   Height:      Weight:      SpO2: 97% 96% 96% 98%    Intake/Output Summary (Last 24 hours) at 12/19/15 1500 Last data filed at 12/19/15 [id number]  Gross per 24 hour  Intake    420 ml  Output    400 ml  Net     20 ml   Filed Weights   12/16/15 2337  Weight: 76.7 kg (169 lb 1.5 oz)    Examination:  General exam: ill appearing- on 4L O2 Respiratory system: Diminished air entry, scattered wheezing. Cardiovascular system: S1 & S2 heard, RRR. No JVD, murmurs, rubs, gallops or clicks Gastrointestinal system: Abdomen is nondistended, soft and nontender. No organomegaly or masses felt. Normal bowel sounds heard. Central nervous system: Alert and oriented. No focal neurological deficits. Extremities: Symmetric 5 x 5 power. Skin:  No rashes, lesions or ulcers Psychiatry: Judgement and insight appear normal. Mood & affect appropriate.     Data Reviewed: I have personally reviewed following labs and imaging studies  CBC:  Recent Labs Lab 12/16/15 2057 12/17/15 2338 12/19/15 0806  WBC 9.0 6.6 11.0*  NEUTROABS 5.6  --   --   HGB 11.9* 9.8* 10.2*  HCT 39.8 32.8* 34.3*  MCV 100.0 99.4 100.0  PLT 239 224 256   Basic Metabolic Panel:  Recent Labs Lab 12/16/15 2057 12/17/15 0203 12/17/15 2338  12/19/15 0806  NA 139  --  136 142  K 3.4*  --  3.7 4.5  CL 93*  --  97* 99*  CO2 32  --  28 37*  GLUCOSE 142*  --  267* 162*  BUN 7  --  12 15  CREATININE 0.83  --  0.88 0.66  CALCIUM 8.7*  --  8.6* 8.7*  MG  --  2.1  --   --    GFR: Estimated Creatinine Clearance: 85 mL/min (by C-G formula based on Cr of 0.66). Liver Function Tests: No results for input(s): AST, ALT, ALKPHOS, BILITOT, PROT, ALBUMIN in the last 168 hours. No results for input(s): LIPASE, AMYLASE in the last 168 hours. No results for input(s): AMMONIA in the last 168 hours. Coagulation Profile:  Recent Labs Lab 12/16/15 2340  INR 1.54*   Cardiac Enzymes: No results for input(s): CKTOTAL, CKMB, CKMBINDEX, TROPONINI in the last 168 hours. BNP (last 3 results) No results for input(s): PROBNP in the last 8760 hours. HbA1C: No results for input(s): HGBA1C in the last 72 hours. CBG:  Recent Labs Lab 12/18/15 1253 12/18/15 1632 12/18/15 2107 12/19/15 0739 12/19/15 1127  GLUCAP 181* 93 223* 150* 197*   Lipid Profile: No results for input(s): CHOL, HDL, LDLCALC, TRIG, CHOLHDL, LDLDIRECT in the last 72 hours. Thyroid Function Tests: No results for input(s): TSH, T4TOTAL, FREET4, T3FREE, THYROIDAB in the last 72 hours. Anemia Panel: No results for input(s): VITAMINB12, FOLATE, FERRITIN, TIBC, IRON, RETICCTPCT in the last 72 hours. Urine analysis:    Component Value Date/Time   COLORURINE YELLOW 12/16/2015 2124   APPEARANCEUR CLEAR 12/16/2015 2124   LABSPEC 1.011 12/16/2015 2124   PHURINE 5.5 12/16/2015 2124   GLUCOSEU NEGATIVE 12/16/2015 2124   HGBUR LARGE* 12/16/2015 2124   BILIRUBINUR NEGATIVE 12/16/2015 2124   KETONESUR NEGATIVE 12/16/2015 2124   PROTEINUR NEGATIVE 12/16/2015 2124   UROBILINOGEN 0.2 10/30/2012 1429   NITRITE NEGATIVE 12/16/2015 2124   LEUKOCYTESUR NEGATIVE 12/16/2015 2124    Recent Results (from the past 240 hour(s))  Urine culture     Status: None   Collection Time:  12/16/15  9:24 PM  Result Value Ref Range Status   Specimen Description URINE, CLEAN CATCH  Final   Special Requests NONE  Final   Culture MULTIPLE SPECIES PRESENT, SUGGEST RECOLLECTION  Final   Report Status 12/17/2015 FINAL  Final  Culture, blood (Routine X 2) w Reflex to ID Panel     Status: None (Preliminary result)   Collection Time: 12/16/15  9:38 PM  Result Value Ref Range Status   Specimen Description BLOOD RIGHT ANTECUBITAL  Final   Special Requests BOTTLES DRAWN AEROBIC AND ANAEROBIC 10CC EA  Final   Culture NO GROWTH 2 DAYS  Final   Report Status PENDING  Incomplete  Culture, blood (Routine X 2) w Reflex to ID Panel     Status: None (Preliminary result)   Collection Time: 12/16/15  9:48 PM  Result Value Ref Range Status   Specimen Description BLOOD RIGHT FOREARM  Final   Special Requests BOTTLES DRAWN AEROBIC AND ANAEROBIC 5CC EA  Final   Culture NO GROWTH 2 DAYS  Final   Report Status PENDING  Incomplete  Respiratory virus panel     Status: Abnormal   Collection Time: 12/17/15  1:22 AM  Result Value Ref Range Status   Source - RVPAN NASAL SWAB  Corrected   Respiratory Syncytial Virus A Negative Negative Final   Respiratory Syncytial Virus B Negative Negative Final   Influenza A Negative Negative Final   Influenza B Negative Negative Final   Parainfluenza 1 Negative Negative Final   Parainfluenza 2 Negative Negative Final   Parainfluenza 3 Positive (A) Negative Final    Comment:                   Client Requested Flag   Metapneumovirus Negative Negative Final   Rhinovirus Negative Negative Final   Adenovirus Negative Negative Final    Comment: (NOTE) Performed At: [facility] [address] [place], [place] [id number] Mila Homer MD VO:[id number]   MRSA PCR Screening     Status: None   Collection Time: 12/17/15  1:22 AM  Result Value Ref Range Status   MRSA by PCR NEGATIVE NEGATIVE Final    Comment:        The GeneXpert MRSA Assay (FDA approved  for NASAL specimens only), is one component of a comprehensive MRSA colonization surveillance program. It is not intended to diagnose MRSA infection nor to guide or monitor treatment for MRSA infections.       Anti-infectives    Start     Dose/Rate Route Frequency Ordered Stop   12/17/15 1000  vancomycin (VANCOCIN) IVPB 1000 mg/200 mL premix     1,000 mg 200 mL/hr over 60 Minutes Intravenous Every 12 hours 12/16/15 2108     12/17/15 0600  ceFEPIme (MAXIPIME) 1 g in dextrose 5 % 50 mL IVPB     1 g 100 mL/hr over 30 Minutes Intravenous Every 8 hours 12/16/15 2240 12/25/15 0559   12/16/15 2130  vancomycin (VANCOCIN) 1,500 mg in sodium chloride 0.9 % 500 mL IVPB     1,500 mg 250 mL/hr over 120 Minutes Intravenous  Once 12/16/15 2108 12/16/15 2349   12/16/15 2100  ceFEPIme (MAXIPIME) 2 g in dextrose 5 % 50 mL IVPB     2 g 100 mL/hr over 30 Minutes Intravenous  Once 12/16/15 2055 12/16/15 2234       Radiology Studies: Dg Chest 2 View  12/18/2015  CLINICAL DATA:  Pneumonia. EXAM: CHEST  2 VIEW COMPARISON:  12/16/2015. FINDINGS: Mediastinum hilar structures normal. Surgical sutures are noted throughout the right lung. Left mid lung field mild infiltrate cannot be excluded. Pleural-parenchymal thickening is noted bilaterally particular on the right consistent with scarring. COPD. No pneumothorax. Stable cardiomegaly, no pulmonary venous congestion IMPRESSION: 1. Persistent mild left base atelectasis and/or infiltrate. 2. Postsurgical changes right lung. Bilateral pleural parenchymal scarring right side greater than left. COPD. 3. Stable cardiomegaly. Electronically Signed   By: Maisie Fus  Register   On: 12/18/2015 07:46        Scheduled Meds: . alfuzosin  10 mg Oral Daily  . apixaban  5 mg Oral BID  . atorvastatin  20 mg Oral Daily  . calcium-vitamin D  1 tablet Oral BID WC  . ceFEPime (MAXIPIME) IV  1 g Intravenous Q8H  . diltiazem  180 mg Oral  Daily  . diphenhydrAMINE  25 mg  Intravenous Once  . guaiFENesin  1,200 mg Oral BID  . insulin aspart  0-20 Units Subcutaneous TID WC  . insulin aspart  0-5 Units Subcutaneous QHS  . insulin aspart  3 Units Subcutaneous TID WC  . ipratropium-albuterol  3 mL Nebulization TID  . methylPREDNISolone (SOLU-MEDROL) injection  40 mg Intravenous TID  . mirabegron ER  50 mg Oral Daily  . multivitamin with minerals  1 tablet Oral Daily  . pantoprazole  40 mg Oral Daily  . propafenone  225 mg Oral BID  . vancomycin  1,000 mg Intravenous Q12H   Continuous Infusions:     LOS: 3 days    Time spent: 25 min    Ricci Dirocco, MD [facility] Pager [phone]  If 7PM-7AM, please contact night-coverage [email or web address] Password [email or web address] 12/19/2015, 3:00 PM

## 2015-12-19 NOTE — Care Management Note (Signed)
Case Management Note  Patient Details  Name: Mark [name] MRN: [id number] Date of Birth: [birth year]  Subjective/Objective:        CM following for progression and d/c planning.            Action/Plan: 12/19/2015 Met with pt and wife, pt is active with [facility] for HHRN, [place] and aide, OT eval recommending [place] at d/c as well as previous services. This CM contacted [facility] to resume services and will add HHOT.  Pt has DME and oxygen from [facility], wife will  Bring tank for d/c to home.  Expected Discharge Date:                  Expected Discharge Plan:  [place]  In-House Referral:  NA  Discharge planning Services  CM Consult  Post Acute Care Choice:  Resumption of Svcs/PTA Provider Choice offered to:     DME Arranged:   NA DME Agency:   NA  HH Arranged:  RN, PT, Nurse's Aide, OT HH Agency:  [place]  Status of Service:  In process, will continue to follow  Medicare Important Message Given:  Yes Date Medicare IM Given:    Medicare IM give by:    Date Additional Medicare IM Given:    Additional Medicare Important Message give by:     If discussed at [place] of Stay Meetings, dates discussed:    Additional Comments:  Adron Bene, RN 12/19/2015, 10:33 AM

## 2015-12-19 NOTE — Care Management Important Message (Signed)
Important Message  Patient Details  Name: Mark [name] MRN: [id number] Date of Birth: 12/03/1940   Medicare Important Message Given:  Yes    Maylea Soria, Annamarie Majorheryl U, RN 12/19/2015, 10:29 AM

## 2015-12-19 NOTE — Progress Notes (Signed)
Pharmacy Antibiotic Note  Mark [name] is a 75 y.o. male admitted on 12/16/2015 with pneumonia.  Pharmacy has been consulted for vancomycin dosing.  Also on cefepime 1g IV q 8 hrs.  Scr fairly stable, est CrCl ~ 85 ml/min.  Goal vancomycin trough 15-5820mcg/mL  Plan: 1. Continue vancomycin 1g IV q 12 hrs. 2. Will check vancomycin trough level with tonight's dose. 3. F/u cultures, renal function, and length of vancomycin therapy.   Height: 5\' 11"  (180.3 cm) Weight: 169 lb 1.5 oz (76.7 kg) IBW/kg (Calculated) : 75.3  Temp (24hrs), Avg:97.6 F (36.4 C), Min:97.3 F (36.3 C), Max:97.8 F (36.6 C)   Recent Labs Lab 12/16/15 2057 12/16/15 2103 12/16/15 2340 12/17/15 0203 12/17/15 2338 12/19/15 0806  WBC 9.0  --   --   --  6.6 11.0*  CREATININE 0.83  --   --   --  0.88 0.66  LATICACIDVEN  --  1.50 0.9 0.9  --   --     Estimated Creatinine Clearance: 85 mL/min (by C-G formula based on Cr of 0.66).    Allergies  Allergen Reactions  . Zolpidem Tartrate Other (See Comments)    disoriented  . Shrimp [Shellfish Allergy] Diarrhea and Nausea And Vomiting    Antimicrobials this admission: vanc 4/23 >>  cefepime 4/23 >>   Dose adjustments this admission: n/a  Microbiology results: 4/23 BCx: ngtd 4/23 UCx:  multiple species - suggest recollect 4/24 Resp virus panel: (+) Parainfluenza A   Thank you for allowing pharmacy to be a part of this patient's care.  Tad MooreJessica Dyllan Hughett, Pharm D, BCPS  Clinical Pharmacist Pager [phone]  12/19/2015 10:26 AM

## 2015-12-19 NOTE — Progress Notes (Signed)
Pharmacy Antibiotic Note  Mark DakinGrady J [name] is a 75 y.o. male admitted on 12/16/2015 with pneumonia.  Pharmacy has been consulted for vancomycin dosing.  Also on cefepime 1g IV q 8 hrs.  Scr fairly stable, est CrCl ~ 85 ml/min. Vancomycin trough = 14 slightly below goal (15-6620mcg/mL)  Plan: 1. Increase vancomycin dose to  1250 mg IV q 12 hrs. 2. F/u cultures, renal function, and length of vancomycin therapy.    Height: 5\' 11"  (180.3 cm) Weight: 170 lb (77.111 kg) IBW/kg (Calculated) : 75.3  Temp (24hrs), Avg:97.7 F (36.5 C), Min:97.3 F (36.3 C), Max:98.4 F (36.9 C)   Recent Labs Lab 12/16/15 2057 12/16/15 2103 12/16/15 2340 12/17/15 0203 12/17/15 2338 12/19/15 0806 12/19/15 2122  WBC 9.0  --   --   --  6.6 11.0*  --   CREATININE 0.83  --   --   --  0.88 0.66  --   LATICACIDVEN  --  1.50 0.9 0.9  --   --   --   VANCOTROUGH  --   --   --   --   --   --  14    Estimated Creatinine Clearance: 85 mL/min (by C-G formula based on Cr of 0.66).    Allergies  Allergen Reactions  . Zolpidem Tartrate Other (See Comments)    disoriented  . Shrimp [Shellfish Allergy] Diarrhea and Nausea And Vomiting    Antimicrobials this admission: vanc 4/23 >>  cefepime 4/23 >>   Dose adjustments this admission: 4/26 vancomycin trough = 14 on 1g Q 12, increase to 1250 mg Q 12  Microbiology results: 4/23 BCx: ngtd 4/23 UCx:  multiple species - suggest recollect 4/24 Resp virus panel: (+) Parainfluenza A   Thank you for allowing pharmacy to be a part of this patient's care.  Bayard HuggerMei Mando Blatz, PharmD, BCPS  Clinical Pharmacist  Pager: [phone]    12/19/2015 10:14 PM

## 2015-12-19 NOTE — Discharge Instructions (Signed)

## 2015-12-20 DIAGNOSIS — Z7901 Long term (current) use of anticoagulants: Secondary | ICD-10-CM

## 2015-12-20 LAB — HEMOGLOBIN A1C
Hgb A1c MFr Bld: 6.7 % — ABNORMAL HIGH (ref 4.8–5.6)
Mean Plasma Glucose: 146 mg/dL

## 2015-12-20 LAB — GLUCOSE, CAPILLARY
GLUCOSE-CAPILLARY: 200 mg/dL — AB (ref 65–99)
Glucose-Capillary: 133 mg/dL — ABNORMAL HIGH (ref 65–99)

## 2015-12-20 MED ORDER — AMOXICILLIN-POT CLAVULANATE 875-125 MG PO TABS
1.0000 | ORAL_TABLET | Freq: Two times a day (BID) | ORAL | Status: DC
Start: 2015-12-20 — End: 2015-12-28

## 2015-12-20 MED ORDER — METFORMIN HCL 500 MG PO TABS: 500.0000 mg | ORAL_TABLET | Freq: Every day | ORAL | Status: DC

## 2015-12-20 MED ORDER — PREDNISONE 20 MG PO TABS: ORAL_TABLET | ORAL | Status: DC

## 2015-12-20 MED ORDER — SACCHAROMYCES BOULARDII 250 MG PO CAPS: 250.0000 mg | ORAL_CAPSULE | Freq: Two times a day (BID) | ORAL | Status: DC

## 2015-12-20 NOTE — Progress Notes (Signed)
O2 weaned from 3LPM to 2LPM to keep sats 90-92%. Pt tolerating at this time. RT will continue to monitor.

## 2015-12-20 NOTE — Discharge Summary (Signed)
Mark [name], is a 75 y.o. male  DOB [birth year]  MRN [id number].  Admission date:  12/16/2015  Admitting Physician  Lorretta Harp, MD  Discharge Date:  12/20/2015   Primary MD  [name], MD  Recommendations for primary care physician for things to follow:  - Please check 2 view chest x-ray in 2 weeks - Patient started on low-dose metformin for uncontrolled hyperglycemia, glycohemoglobin A1c 6.7,.  Admission Diagnosis  HCAP (healthcare-associated pneumonia) [J18.9]   Discharge Diagnosis  HCAP (healthcare-associated pneumonia) [J18.9]    Principal Problem:   HCAP (healthcare-associated pneumonia) Active Problems:   HLD (hyperlipidemia)   COPD with chronic bronchitis-oxygen dependent   History of CVA (cerebrovascular accident) 5/16   Essential hypertension   Acute respiratory failure with hypoxia (HCC)   COPD exacerbation (HCC)   Chronic anticoagulation   Hypokalemia      Past Medical History  Diagnosis Date  . Atrial fibrillation (HCC)   . Hyperlipidemia   . Headache(784.0)   . Osteopenia   . COPD (chronic obstructive pulmonary disease) (HCC)   . Allergic rhinitis   . History of echocardiogram     Echo 5/16:  EF 55-60%, no RWMA, Gr 1 DD  . Incontinence of urine     and stool  . Memory loss   . CVA (cerebral infarction)   . Shortness of breath dyspnea   . GERD (gastroesophageal reflux disease)   . Stroke [facility])     Past Surgical History  Procedure Laterality Date  . Catheter ablation    . Cataract extraction, bilateral    . Appendectomy  1958  . Inguinal hernia repair  1975, 1992  . Video assisted thoracoscopy (vats)/thorocotomy  08/17/2012    resection / stapling of blebs, mechanical pleurodesis   . Video assisted thoracoscopy  08/17/2012    Procedure: VIDEO ASSISTED THORACOSCOPY;  Surgeon: [name], MD;  Location: [facility] OR;  Service: Thoracic;  Laterality: Right;    . Resection of apical bleb  08/17/2012    Procedure: RESECTION OF APICAL BLEB;  Surgeon: [name], MD;  Location: [facility] OR;  Service: Thoracic;  Laterality: Right;  stapling of bleb  . Video bronchoscopy  08/27/2012    Procedure: VIDEO BRONCHOSCOPY;  Surgeon: [name], MD;  Location: [facility] OR;  Service: Thoracic;  Laterality: N/A;  evaluation of endobronchial valves       History of present illness and  Hospital Course:     Kindly see H&P for history of present illness and admission details, please review complete Labs, Consult reports and Test reports for all details in brief  HPI  from the history and physical done on the day of admission 12/16/2015  HPI: Mark [name] is a 75 y.o. male with medical history significant of COPD on 2 L oxygen at home, hyperlipidemia, GERD, atrial fibrillation on Eliquis, stroke, who presents with cough, fever and shortness of breath.  Patient was recently hospitalized from 3/28 to 4/5 due to spontaneous pneumothorax, his chest tube was removed on  11/24/15. He has been doing fine until today when he developed fever, worsening short of breath and cough. He coughs up little yellow colored sputum. No chest pain. He has runny nose and sore throat. Patient does not have abdominal pain, nausea, vomiting, diarrhea, symptoms of a UTI or unilateral weakness.  ED Course: pt was found to have WBC 9.0, temperature 100.2, no tachycardia, tachypnea, oxygen desaturation to 92%, lactate 1.50, negative troponin, negative urinalysis, potassium 3.4, creatinine normal. Chest x-ray showed possible left base infiltration. Patient admitted to inpatient for further eval and treatment.   Hospital Course  Acute respiratory failure with hypoxia due to combination of HCAP and COPD exacerbation:  -Nebulizers: scheduled Duoneb and prn albuterol, continue when necessary albuterol and discharge -Initially on Solu-Medrol- weaned, will be discharged on tapering dose by mouth  prednisone, patient on baseline 20 mg prednisone daily -Did with IV vancomycin and cefepime during hospital stay, will be discharge another 3 days of by mouth Augmentin -Mucinex for cough  -Urine S. Pneumococcal and Legionella antigen negative - Parainfluenza 3 positive - blood culture x2, sputum culture, Flu pcr negative - keep O 2 sat 90-92%- no higher  Hyperglycemia/diabetes mellitus - Patient with uncontrolled CBG during hospital stay, hemoglobin A1c is 6.7, has been on insulin sliding scale, and Lantus during hospital stay, patient started on low-dose metformin.   History of CVA (cerebrovascular accident): -on Eliquis and lipitor  HTN: -Continue home medication  GERD: -Protonix  Atrial Fibrillation:  CHA2DS2-VASc Score is 4, continue with Eliquis ,  Heart rate is well controlled. -continue propranolol and Cardizem  Hypokalemia: K=3.4 on admission. - Repleted - Mg level ok   -add TED hose and decrease Cardizem as goal BP is > 120 for brain perfusion per Dr. Roda ShuttersXu      Discharge Condition:  stable   Follow UP  Follow-up Information    Follow up with Kaleen MaskELKINS,WILSON OLIVER, MD. Schedule an appointment as soon as possible for a visit in 1 week.   Specialty:  Family Medicine   Contact information:   [address] [place] [address] [phone]       Schedule an appointment as soon as possible for a visit with Waymon BudgeYOUNG,CLINTON D, MD.   Specialty:  Pulmonary Disease   Contact information:   [address] [place] [address] [phone]         Discharge Instructions  and  Discharge Medications    Discharge Instructions    Discharge instructions    Complete by:  As directed   Follow with Primary MD Kaleen MaskELKINS,WILSON OLIVER, MD in 7 days   Get CBC, CMP, 2 view Chest X ray checked  by Primary MD next visit.    Activity: As tolerated with Full fall precautions use walker/cane & assistance as needed   Disposition Home    Diet: Heart Healthy ,  carbohydrate modified , with feeding assistance and aspiration precautions.  For Heart failure patients - Check your Weight same time everyday, if you gain over 2 pounds, or you develop in leg swelling, experience more shortness of breath or chest pain, call your Primary MD immediately. Follow Cardiac Low Salt Diet and 1.5 lit/day fluid restriction.   On your next visit with your primary care physician please Get Medicines reviewed and adjusted.   Please request your Prim.MD to go over all Hospital Tests and Procedure/Radiological results at the follow up, please get all Hospital records sent to your Prim MD by signing hospital release before you go home.  If you experience worsening of your admission symptoms, develop shortness of breath, life threatening emergency, suicidal or homicidal thoughts you must seek medical attention immediately by calling 911 or calling your MD immediately  if symptoms less severe.  You Must read complete instructions/literature along with all the possible adverse reactions/side effects for all the Medicines you take and that have been prescribed to you. Take any new Medicines after you have completely understood and accpet all the possible adverse reactions/side effects.   Do not drive, operating heavy machinery, perform activities at heights, swimming or participation in water activities or provide baby sitting services if your were admitted for syncope or siezures until you have seen by Primary MD or a Neurologist and advised to do so again.  Do not drive when taking Pain medications.    Do not take more than prescribed Pain, Sleep and Anxiety Medications  Special Instructions: If you have smoked or chewed Tobacco  in the last 2 yrs please stop smoking, stop any regular Alcohol  and or any Recreational drug use.  Wear Seat belts while driving.   Please note  You were cared for by a hospitalist during your hospital stay. If you have any questions about  your discharge medications or the care you received while you were in the hospital after you are discharged, you can call the unit and asked to speak with the hospitalist on call if the hospitalist that took care of you is not available. Once you are discharged, your primary care physician will handle any further medical issues. Please note that NO REFILLS for any discharge medications will be authorized once you are discharged, as it is imperative that you return to your primary care physician (or establish a relationship with a primary care physician if you do not have one) for your aftercare needs so that they can reassess your need for medications and monitor your lab values.     Increase activity slowly    Complete by:  As directed             Medication List    TAKE these medications        acetaminophen 325 MG tablet  Commonly known as:  TYLENOL  Take 650 mg by mouth every 6 (six) hours as needed for mild pain.     albuterol (2.5 MG/3ML) 0.083% nebulizer solution  Commonly known as:  PROVENTIL  USE ONE VIAL VIA NEBULIZER 4 TIMES DAILY     albuterol 108 (90 Base) MCG/ACT inhaler  Commonly known as:  PROAIR HFA  Inhale 2 puffs into the lungs every 4 (four) hours as needed for wheezing or shortness of breath. For shortness of breath/wheezing     alendronate 70 MG tablet  Commonly known as:  FOSAMAX  Take 70 mg by mouth every 7 (seven) days. Takes on Tuesday Take with a full glass of water on an empty stomach.     alfuzosin 10 MG 24 hr tablet  Commonly known as:  UROXATRAL  Take 1 tablet by mouth daily.     ALPRAZolam 0.25 MG tablet  Commonly known as:  XANAX  Take 1 tablet (0.25 mg total) by mouth 2 (two) times daily as needed for anxiety or sleep.     amoxicillin-clavulanate 875-125 MG tablet  Commonly known as:  AUGMENTIN  Take 1 tablet by mouth 2 (two) times daily.     apixaban 5 MG Tabs tablet  Commonly known as:  ELIQUIS  Take 1 tablet (5 mg total)  by mouth 2 (two)  times daily.     atorvastatin 20 MG tablet  Commonly known as:  LIPITOR  Take 1 tablet (20 mg total) by mouth daily.     CENTRUM SILVER PO  Take 1 tablet by mouth daily.     Compressor/Nebulizer Misc  Use as directed     diltiazem 240 MG 24 hr tablet  Commonly known as:  CARDIZEM LA  Take 1 tablet (240 mg total) by mouth daily.     EPIPEN 2-PAK 0.3 mg/0.3 mL Soaj injection  Generic drug:  EPINEPHrine  INJECT 0.3 MLS (0.3 MG TOTAL) INTO THE MUSCLE ONCE. FOR SEVERE SHORTNESS OF BREATH     furosemide 20 MG tablet  Commonly known as:  LASIX  Take 1 tablet (20 mg total) by mouth daily.     guaiFENesin 600 MG 12 hr tablet  Commonly known as:  MUCINEX  Take 1-2 mg by mouth every 12 (twelve) hours as needed. For cough     HYDROcodone-acetaminophen 5-325 MG tablet  Commonly known as:  NORCO/VICODIN  Take 1 tablet by mouth every 6 (six) hours as needed for moderate pain.     metFORMIN 500 MG tablet  Commonly known as:  GLUCOPHAGE  Take 1 tablet (500 mg total) by mouth daily with breakfast.     MYRBETRIQ 50 MG Tb24 tablet  Generic drug:  mirabegron ER  Take 1 tablet by mouth daily.     omeprazole 20 MG capsule  Commonly known as:  PRILOSEC  Take 1 capsule (20 mg total) by mouth 2 (two) times daily.     ONE-A-DAY CALCIUM PLUS 500-50-100 MG-MG-UNIT Chew  Generic drug:  Calcium-Magnesium-Vitamin D  Chew 1 tablet by mouth 2 (two) times daily.     OXYGEN  Inhale 2-4 L into the lungs daily as needed (2 L for sleep and 4L for exertion).     polyethylene glycol packet  Commonly known as:  MIRALAX / GLYCOLAX  Take 17 g by mouth daily as needed for mild constipation.     predniSONE 20 MG tablet  Commonly known as:  DELTASONE  Please take 60 mg oral daily for 3 days, then 40 mg oral daily for 3 days, then back to baseline off 20 mg oral daily.     promethazine-codeine 6.25-10 MG/5ML syrup  Commonly known as:  PHENERGAN with CODEINE  TAKE 5 ML EVERY 6 HOURS AS NEEDED FOR COUGH       propafenone 225 MG tablet  Commonly known as:  RYTHMOL  TAKE ONE TABLET BY MOUTH TWICE A DAY     saccharomyces boulardii 250 MG capsule  Commonly known as:  FLORASTOR  Take 1 capsule (250 mg total) by mouth 2 (two) times daily.     umeclidinium-vilanterol 62.5-25 MCG/INH Aepb  Commonly known as:  ANORO ELLIPTA  Inhale 1 puff into the lungs daily. RINSE MOUTH WELL AFTER USE          Diet and Activity recommendation: See Discharge Instructions above   Consults obtained -  none   Major procedures and Radiology Reports - PLEASE review detailed and final reports for all details, in brief -      Dg Chest 2 View  12/18/2015  CLINICAL DATA:  Pneumonia. EXAM: CHEST  2 VIEW COMPARISON:  12/16/2015. FINDINGS: Mediastinum hilar structures normal. Surgical sutures are noted throughout the right lung. Left mid lung field mild infiltrate cannot be excluded. Pleural-parenchymal thickening is noted bilaterally particular on the right consistent with scarring. COPD.  No pneumothorax. Stable cardiomegaly, no pulmonary venous congestion IMPRESSION: 1. Persistent mild left base atelectasis and/or infiltrate. 2. Postsurgical changes right lung. Bilateral pleural parenchymal scarring right side greater than left. COPD. 3. Stable cardiomegaly. Electronically Signed   By: Maisie Fus  Register   On: 12/18/2015 07:46   Dg Chest 2 View  12/05/2015  CLINICAL DATA:  Pneumothorax, post thoracotomy and chest tube placement EXAM: CHEST  2 VIEW COMPARISON:  11/27/2015 FINDINGS: Normal heart size, mediastinal contours, and pulmonary vascularity. Emphysematous and bronchitic changes consistent with COPD. Postsurgical changes and scarring RIGHT lung with blunting and elevation of the RIGHT costophrenic angle. Minimal atelectasis mid LEFT lung. No infiltrate, pleural effusion or pneumothorax. Mild residual LEFT chest wall emphysema inferiorly. Bones demineralized. IMPRESSION: COPD changes with scattered scarring and  minimal LEFT Mon subsegmental atelectasis. No pneumothorax identified. Electronically Signed   By: Ulyses Southward M.D.   On: 12/05/2015 14:50   Dg Chest Port 1 View  12/16/2015  CLINICAL DATA:  Shortness of breath and fever. EXAM: PORTABLE CHEST 1 VIEW COMPARISON:  12/05/2015 chest radiograph. FINDINGS: Stable cardiomediastinal silhouette with normal heart size. No pneumothorax. Stable blunting of the right costophrenic angle, probably chronic pleural-parenchymal scarring. No definite pleural effusion. Advanced emphysema. Multiple surgical sutures throughout the right lung. Hazy opacity at the left lung base appears slightly increased. No pulmonary edema. IMPRESSION: 1. Hazy left lung base opacity, favor scarring or atelectasis, difficult to exclude a developing pneumonia. Consider further evaluation with PA and lateral chest radiographs. 2. Advanced emphysema. Electronically Signed   By: Delbert Phenix M.D.   On: 12/16/2015 21:19   Dg Chest Port 1 View  11/27/2015  CLINICAL DATA:  Shortness of breath, low O2 sats.  COPD EXAM: PORTABLE CHEST 1 VIEW COMPARISON:  11/25/2015 FINDINGS: There is hyperinflation of the lungs compatible with COPD. Left base atelectasis. Postsurgical changes on the right with blunting of the right costophrenic angle which could reflect a small effusion or pleural thickening. Heart is normal size. IMPRESSION: Stable exam. Left base atelectasis. Postoperative changes on the right. Electronically Signed   By: Charlett Nose M.D.   On: 11/27/2015 13:01   Dg Chest Port 1 View  11/25/2015  CLINICAL DATA:  Follow-up pneumothorax. EXAM: PORTABLE CHEST 1 VIEW COMPARISON:  11/24/2015 and prior exams FINDINGS: Right lung postsurgical changes are identified. There is no evidence of pneumothorax. Cardiomediastinal silhouette is unchanged. Minimal left basilar atelectasis noted. IMPRESSION: Unchanged appearance of the chest- no evidence of pneumothorax. Minimal left basilar atelectasis. Electronically  Signed   By: Harmon Pier M.D.   On: 11/25/2015 09:49   Dg Chest Port 1 View  11/24/2015  CLINICAL DATA:  Followup left pneumothorax.  Emphysema. EXAM: PORTABLE CHEST 1 VIEW COMPARISON:  11/23/2015 FINDINGS: Left-sided chest tube has been removed. No residual pneumothorax visualized. Mild residual infiltrate or atelectasis seen at the left lung base. Severe emphysema again demonstrated. Surgical staples seen in the right lung field with chronic pleural thickening at the right lung base. Heart size normal. IMPRESSION: No evidence of pneumothorax following left chest tube removal. Persistent mild left basilar atelectasis versus infiltrate. Severe emphysema and postop changes in right hemithorax. Electronically Signed   By: Myles Rosenthal M.D.   On: 11/24/2015 14:06   Dg Chest Port 1 View  11/23/2015  CLINICAL DATA:  Shortness of breath. EXAM: PORTABLE CHEST 1 VIEW COMPARISON:  11/22/2015. FINDINGS: Mediastinum and hilar structures normal. Left chest tube in stable position. No pneumothorax. Mild left base atelectasis and or infiltrate. Bibasilar  pleural thickening again noted consistent with scarring. Surgical sutures noted over the right upper and right lower lung. COPD. Stable cardiomegaly. IMPRESSION: 1.  Left chest tube in stable position.  No pneumothorax. 2. Mild left base atelectasis and or infiltrate . 3. Bilateral pleural parenchymal scarring. Postsurgical changes right lung. COPD. 4. Stable cardiomegaly.  No pulmonary venous congestion. Electronically Signed   By: Maisie Fus  Register   On: 11/23/2015 07:43   Dg Chest Port 1 View  11/22/2015  CLINICAL DATA:  75 year old male with shortness of breath EXAM: PORTABLE CHEST 1 VIEW COMPARISON:  Radiograph dated 11/22/2015 FINDINGS: Single-view of chest demonstrates emphysematous changes of the lungs. Left-sided chest tube appears in stable positioning. No pneumothorax identified. Left lung base densities similar to prior study and likely atelectasis in nature.  Postsurgical changes of the right lung with scarring. There is blunting of the right costophrenic angle which may postsurgical. A small right pleural effusion is not excluded. The cardiac silhouette is within normal limits with no acute osseous pathology. IMPRESSION: No interval change.  No pneumothorax. Electronically Signed   By: Elgie Collard M.D.   On: 11/22/2015 23:23   Dg Chest Port 1 View  11/22/2015  CLINICAL DATA:  Shortness of breath this morning, improving. Left chest tightness. History of hypertension, a ablation for atrial flutter 5 years ago. History of COPD requiring home O2. EXAM: PORTABLE CHEST 1 VIEW COMPARISON:  Chest x-rays dated 11/21/2015, 11/20/2015 and 05/03/2015. FINDINGS: Left-sided chest tube stable in position. No pneumothorax seen. Persistent streaky opacities at the left lung base, compatible with atelectasis. Stable scarring within the right lung. Additional stable postsurgical changes within the right lung. Heart size is upper normal, stable. Overall cardiomediastinal silhouette is stable in size and configuration. Osseous structures about the chest are unremarkable. IMPRESSION: 1. Stable position of the left-sided chest tube. No pneumothorax seen. 2. Persistent opacity at the left lung base, most suggestive of atelectasis. Stable scarring and postsurgical change within the right lung. No new lung findings. Electronically Signed   By: Bary Richard M.D.   On: 11/22/2015 12:12   Dg Chest Port 1 View  11/21/2015  CLINICAL DATA:  New onset of shortness of breath. Patient has a left chest tube. EXAM: PORTABLE CHEST 1 VIEW COMPARISON:  11/20/2015 FINDINGS: Stable position of the left chest tube. No evidence for a left pneumothorax. Left basilar densities are suggestive for atelectasis. Stable blunting and probable scarring at the right costophrenic angle. Again noted are postsurgical changes in the right lung. Heart size is within normal limits and stable. The trachea remains  midline. IMPRESSION: Stable position of the left chest tube without a pneumothorax. Left basilar atelectasis. Electronically Signed   By: Richarda Overlie M.D.   On: 11/21/2015 07:35   Dg Chest Portable 1 View  11/20/2015  CLINICAL DATA:  Chest tube insertion. EXAM: PORTABLE CHEST 1 VIEW COMPARISON:  Earlier today FINDINGS: New left chest tube with tip over the mid to upper chest. Left pneumothorax is decreased, now trace. There is now chest wall gas on the left. No mediastinal widening. No pulmonary edema or consolidation. Stable heart size. Emphysema with postsurgical changes on the right. IMPRESSION: Nearly resolved left pneumothorax after chest tube insertion. Electronically Signed   By: Marnee Spring M.D.   On: 11/20/2015 16:24   Dg Chest Portable 1 View  11/20/2015  CLINICAL DATA:  Severe respiratory distress. EXAM: PORTABLE CHEST 1 VIEW COMPARISON:  08/06/2015 FINDINGS: Advanced emphysema. There is postsurgical changes on the right with  chain sutures and scarring along the right diaphragm. Remote right rib fractures versus thoracotomy changes. Left pneumothorax estimated at 20%, greatest at the base where there is trace pleural fluid. No edema or pneumonia. Normal heart size and mediastinal contours. Critical Value/emergent results were called by telephone at the time of interpretation on 11/20/2015 at 3:04 pm to Dr. Marily Memos , who verbally acknowledged these results. IMPRESSION: 1. Left pneumothorax estimated at 20%. 2. Severe emphysema. Electronically Signed   By: Marnee Spring M.D.   On: 11/20/2015 15:04    Micro Results     Recent Results (from the past 240 hour(s))  Urine culture     Status: None   Collection Time: 12/16/15  9:24 PM  Result Value Ref Range Status   Specimen Description URINE, CLEAN CATCH  Final   Special Requests NONE  Final   Culture MULTIPLE SPECIES PRESENT, SUGGEST RECOLLECTION  Final   Report Status 12/17/2015 FINAL  Final  Culture, blood (Routine X 2) w Reflex  to ID Panel     Status: None (Preliminary result)   Collection Time: 12/16/15  9:38 PM  Result Value Ref Range Status   Specimen Description BLOOD RIGHT ANTECUBITAL  Final   Special Requests BOTTLES DRAWN AEROBIC AND ANAEROBIC 10CC EA  Final   Culture NO GROWTH 4 DAYS  Final   Report Status PENDING  Incomplete  Culture, blood (Routine X 2) w Reflex to ID Panel     Status: None (Preliminary result)   Collection Time: 12/16/15  9:48 PM  Result Value Ref Range Status   Specimen Description BLOOD RIGHT FOREARM  Final   Special Requests BOTTLES DRAWN AEROBIC AND ANAEROBIC 5CC EA  Final   Culture NO GROWTH 4 DAYS  Final   Report Status PENDING  Incomplete  Respiratory virus panel     Status: Abnormal   Collection Time: 12/17/15  1:22 AM  Result Value Ref Range Status   Source - RVPAN NASAL SWAB  Corrected   Respiratory Syncytial Virus A Negative Negative Final   Respiratory Syncytial Virus B Negative Negative Final   Influenza A Negative Negative Final   Influenza B Negative Negative Final   Parainfluenza 1 Negative Negative Final   Parainfluenza 2 Negative Negative Final   Parainfluenza 3 Positive (A) Negative Final    Comment:                   Client Requested Flag   Metapneumovirus Negative Negative Final   Rhinovirus Negative Negative Final   Adenovirus Negative Negative Final    Comment: (NOTE) Performed At: [facility] [address] [place], [place] [id number] Mila Homer MD WU:[id number]   MRSA PCR Screening     Status: None   Collection Time: 12/17/15  1:22 AM  Result Value Ref Range Status   MRSA by PCR NEGATIVE NEGATIVE Final    Comment:        The GeneXpert MRSA Assay (FDA approved for NASAL specimens only), is one component of a comprehensive MRSA colonization surveillance program. It is not intended to diagnose MRSA infection nor to guide or monitor treatment for MRSA infections.        Today   Subjective:   Mark [name] today has no  headache,no chest or abdominal pain, reports feeling better today, still complaining of dry cough . Objective:   Blood pressure 125/82, pulse 94, temperature 97.5 F (36.4 C), temperature source Oral, resp. rate 20, height  (1.803 m), weight 77.111 kg (  170 lb), SpO2 94 %.   Intake/Output Summary (Last 24 hours) at 12/20/15 1205 Last data filed at 12/20/15 1149  Gross per 24 hour  Intake   1060 ml  Output   1553 ml  Net   -493 ml    Exam General exam:Sitting in a chair eating breakfast Respiratory system: Improved air entry bilaterally, minimal wheezing  Cardiovascular system: S1 & S2 heard, RRR. No JVD, murmurs, rubs, gallops or clicks Gastrointestinal system: Abdomen is nondistended, soft and nontender. No organomegaly or masses felt. Normal bowel sounds heard. Central nervous system: Alert and oriented. No focal neurological deficits. Extremities: Symmetric 5 x 5 power. Skin: No rashes, lesions or ulcers Psychiatry: Judgement and insight appear normal. Mood & affect appropriate.  Data Review   CBC w Diff: Lab Results  Component Value Date   WBC 11.0* 12/19/2015   WBC 10.7* 04/03/2009   HGB 10.2* 12/19/2015   HGB 14.9 04/03/2009   HCT 34.3* 12/19/2015   HCT 44.9 04/03/2009   PLT 256 12/19/2015   PLT 279 04/03/2009   LYMPHOPCT 20 12/16/2015   LYMPHOPCT 11.4* 04/03/2009   MONOPCT 15 12/16/2015   MONOPCT 5.9 04/03/2009   EOSPCT 2 12/16/2015   EOSPCT 0.1 04/03/2009   BASOPCT 0 12/16/2015   BASOPCT 0.1 04/03/2009    CMP: Lab Results  Component Value Date   NA 142 12/19/2015   K 4.5 12/19/2015   CL 99* 12/19/2015   CO2 37* 12/19/2015   BUN 15 12/19/2015   CREATININE 0.66 12/19/2015   PROT 7.7 11/20/2015   ALBUMIN 3.7 11/20/2015   BILITOT 0.6 11/20/2015   ALKPHOS 56 11/20/2015   AST 24 11/20/2015   ALT 22 11/20/2015  .   Total Time in preparing paper work, data evaluation and todays exam - 35 minutes  Mark [name] M.D on 12/20/2015 at 12:05  PM  [facility]   Office  [phone]

## 2015-12-20 NOTE — Progress Notes (Signed)
Occupational Therapy Treatment Patient Details Name: Mark DakinGrady J Moffitt MRN: [id number] DOB: 06/18/1941 Today's Date: 12/20/2015    History of present illness 75 year old male presented with HCAP. PMHx-COPD on 4 L O2 via nasal cannula, chronic prednisone, A. fib on Eliquis, history of CVA history of spontaneous right-sided pneumothorax 11/20/15 chest tube, remote history of smoking, chronic diastolic CHF   OT comments  Pt progressing towards acute OT goals. Focus of session was AE for LB ADLs and education on energy conservation and breathing techniques. Spouse present and included in education. Of note, pt needing about a 2 minute seated rest break during LB dressing with O2 levels on 3L via [place] ranging from 88-92. D/c plan remains appropriate.    Follow Up Recommendations  Home health OT;Supervision/Assistance - 24 hour;Other (comment) (HH aide)    Equipment Recommendations  None recommended by OT    Recommendations for Other Services      Precautions / Restrictions Precautions Precautions: Fall Precaution Comments[facility] (at home, day of adm) Restrictions Weight Bearing Restrictions: No       Mobility Bed Mobility Overal bed mobility: Modified Independent             General bed mobility comments: EOB upon arrival   Transfers Overall transfer level: Needs assistance Equipment used: Rolling walker (2 wheeled) Transfers: Sit to/from Stand Sit to Stand: Supervision Stand pivot transfers: Supervision       General transfer comment: cues for hand placement. from EOB    Balance Overall balance assessment: Needs assistance;History of Falls Sitting-balance support: No upper extremity supported;Feet supported Sitting balance-Leahy Scale: Good Sitting balance - Comments: donned LB dressing in sitting position   Standing balance support: Bilateral upper extremity supported;During functional activity Standing balance-Leahy Scale:  Poor Standing balance comment: reliant on rw with therapist completing LB dressing in standing                   ADL Overall ADL's : Needs assistance/impaired                     Lower Body Dressing: Moderate assistance;Sit to/from stand Lower Body Dressing Details (indicate cue type and reason): Pt completed in sit<>stand using reacher. Pt stood with BUE on rw while therapist finished donning LB garments. Pt able to reach feet to adjust socks. Discussed AE for energy conservation and to avoid trunk flexion to facilitate optimal O2 levels.                General ADL Comments: Completed LB dressing as detailed above. Discussed energy conservation and breathing techniques. Demostrated AE for LB ADLs with pt practicing LB dressing with reacher.Marland Kitchen. Spouse present and included in session.       Vision                     Perception     Praxis      Cognition   Behavior During Therapy: WFL for tasks assessed/performed Overall Cognitive Status: Within Functional Limits for tasks assessed                       Extremity/Trunk Assessment               Exercises     Shoulder Instructions       General Comments      Pertinent Vitals/ Pain       Pain Assessment: No/denies pain  Home Living  Prior Functioning/Environment              Frequency Min 2X/week     Progress Toward Goals  OT Goals(current goals can now be found in the care plan section)  Progress towards OT goals: Progressing toward goals  Acute Rehab OT Goals Patient Stated Goal: To go home today OT Goal Formulation: With patient Time For Goal Achievement: 12/31/15 Potential to Achieve Goals: Good ADL Goals Pt Will Transfer to Toilet: with supervision;ambulating;bedside commode Pt Will Perform Toileting - Clothing Manipulation and hygiene: with modified independence;sit to/from stand;sitting/lateral  leans Additional ADL Goal #1: Pt will demonstrate pursed lip breathing technique during funcitonal tasks with min vc Additional ADL Goal #2: Pt will verbalize 3 energy conservation techniques for ADL Additional ADL Goal #3: Pt will verbalize understadning regarding the use of AE for LB ADL  Plan Discharge plan remains appropriate    Co-evaluation                 End of Session Equipment Utilized During Treatment: Gait belt;Rolling walker;Oxygen (3L)   Activity Tolerance Patient limited by fatigue   Patient Left in bed;with call bell/phone within reach;with family/visitor present   Nurse Communication          Time: 1610-9604 OT Time Calculation (min): 15 min  Charges: OT General Charges $OT Visit: 1 Procedure OT Treatments $Self Care/Home Management : 8-22 mins  Pilar Grammes 12/20/2015, 2:34 PM

## 2015-12-20 NOTE — Progress Notes (Signed)
Physical Therapy Treatment Patient Details Name: Orlie DakinGrady J Seyler MRN: [id number] DOB: 12/18/1940 Today's Date: 12/20/2015    History of Present Illness 75 year old male presented with HCAP. PMHx-COPD on 4 L O2 via nasal cannula, chronic prednisone, A. fib on Eliquis, history of CVA history of spontaneous right-sided pneumothorax 11/20/15 chest tube, remote history of smoking, chronic diastolic CHF    PT Comments    Supervision today for mobility and gait.  Agree with HHPT at discharge for continued therapy.  Follow Up Recommendations  Home health PT;Supervision for mobility/OOB     Equipment Recommendations  None recommended by PT    Recommendations for Other Services       Precautions / Restrictions Precautions Precautions: Fall Precaution Comments[facility] (at home, day of adm) Restrictions Weight Bearing Restrictions: No    Mobility  Bed Mobility Overal bed mobility: Modified Independent             General bed mobility comments: Increased time  Transfers Overall transfer level: Needs assistance Equipment used: Rolling walker (2 wheeled) Transfers: Sit to/from [employer] CorporationStand;Stand Pivot Transfers Sit to Stand: Supervision Stand pivot transfers: Supervision       General transfer comment: Patient using correct hand placement.  [facility].  Ambulation/Gait Ambulation/Gait assistance: Supervision Ambulation Distance (Feet): 30 Feet (30' x2 with seated rest) Assistive device: Rolling walker (2 wheeled) Gait Pattern/deviations: Step-through pattern;Decreased stride length;Trunk flexed Gait velocity: decreased.  Gait velocity interpretation: Below normal speed for age/gender General Gait Details: Patient with slow steady gait, with O2.  DOE with O2 sats dropping to 84% during gait.  Able to bring O2 sats back into 90's with seated rest break.   Stairs            Wheelchair Mobility    Modified Rankin  (Stroke Patients Only)       Balance                                    Cognition Arousal/Alertness: Awake/alert Behavior During Therapy: WFL for tasks assessed/performed Overall Cognitive Status: No family/caregiver present to determine baseline cognitive functioning                      Exercises      General Comments General comments (skin integrity, edema, etc.): Patient reports at home he has to do a little and then rest.  He is familiar with this strategy.      Pertinent Vitals/Pain Pain Assessment: No/denies pain    Home Living                      Prior Function            PT Goals (current goals can now be found in the care plan section) Acute Rehab PT Goals Patient Stated Goal: To go home today Progress towards PT goals: Progressing toward goals    Frequency  Min 3X/week    PT Plan Current plan remains appropriate    Co-evaluation             End of Session Equipment Utilized During Treatment: Oxygen;Gait belt Activity Tolerance: Patient limited by fatigue Patient left: in bed;with call bell/phone within reach     Time: [id number] PT Time Calculation (min) (ACUTE ONLY): 46 min - 10 on BSC = 36 min total  Charges:  $Gait Training:  23-37 mins                    G Codes:      Vena Austria 2016-01-13, 12:21 PM Durenda Hurt. Renaldo Fiddler, [facility] Acute Rehab Services Pager [phone]

## 2015-12-20 NOTE — Progress Notes (Signed)
Per infectious nurses,patient doesn't need to be on airborne precautions.Will D/C isolation.

## 2015-12-21 ENCOUNTER — Telehealth: Payer: Self-pay | Admitting: Internal Medicine

## 2015-12-21 LAB — CULTURE, BLOOD (ROUTINE X 2)
CULTURE: NO GROWTH
Culture: NO GROWTH

## 2015-12-21 NOTE — Telephone Encounter (Signed)
Spoke with pt's wife. Pt needs a HFU appointment within a weeks time. He has been scheduled with TP on 12/28/15 at 3:15pm. Nothing further was needed.

## 2015-12-23 ENCOUNTER — Other Ambulatory Visit: Payer: Self-pay | Admitting: Internal Medicine

## 2015-12-28 ENCOUNTER — Ambulatory Visit (INDEPENDENT_AMBULATORY_CARE_PROVIDER_SITE_OTHER)
Admission: RE | Admit: 2015-12-28 | Discharge: 2015-12-28 | Disposition: A | Discharge status: Home / Self Care | Payer: Medicare Other | Source: Ambulatory Visit | Attending: Adult Health | Admitting: Adult Health

## 2015-12-28 ENCOUNTER — Ambulatory Visit (INDEPENDENT_AMBULATORY_CARE_PROVIDER_SITE_OTHER): Payer: Medicare Other | Admitting: Adult Health

## 2015-12-28 ENCOUNTER — Encounter: Payer: Self-pay | Admitting: Adult Health

## 2015-12-28 VITALS — BP 138/68 | HR 108 | Temp 97.5°F | Ht 70.0 in | Wt 162.0 lb

## 2015-12-28 DIAGNOSIS — J9383 Other pneumothorax: Secondary | ICD-10-CM

## 2015-12-28 DIAGNOSIS — J449 Chronic obstructive pulmonary disease, unspecified: Secondary | ICD-10-CM | POA: Diagnosis not present

## 2015-12-28 DIAGNOSIS — J189 Pneumonia, unspecified organism: Secondary | ICD-10-CM | POA: Diagnosis not present

## 2015-12-28 DIAGNOSIS — I63411 Cerebral infarction due to embolism of right middle cerebral artery: Secondary | ICD-10-CM | POA: Diagnosis not present

## 2015-12-28 NOTE — Assessment & Plan Note (Signed)
HCAP clinically improving after antibiotics. Chest x-ray today shows chronic changes only  Plan  ontinue on current regimen .  Rinse well after BREO .  May use Mucinex Twice daily  As needed  Cough/congestion  May use Delsym 2 tsp Twice daily  As needed  Cough .  Continue on Oxygen .  Follow up with Dr. Maple HudsonYoung in June as planned.  Please contact office for sooner follow up if symptoms do not improve or worsen or seek emergency care

## 2015-12-28 NOTE — Assessment & Plan Note (Signed)
COPD exacerbation, now resolving  Plan  ontinue on current regimen .  Rinse well after BREO .  May use Mucinex Twice daily  As needed  Cough/congestion  May use Delsym 2 tsp Twice daily  As needed  Cough .  Continue on Oxygen .  Follow up with Dr. Maple HudsonYoung in June as planned.  Please contact office for sooner follow up if symptoms do not improve or worsen or seek emergency care

## 2015-12-28 NOTE — Progress Notes (Signed)
Subjective:    Patient ID: Mark [name], male    DOB: 1940/11/02, 75 y.o.   MRN: [id number]  HPI 75 yo Male former heavy smoker with severe COPD,/steroid dependent,  chronic respiratory failure on oxygen  12/28/2015 post hospital follow-up Patient returns for a two-week post hospital follow-up Patient was recently admitted for COPD exacerbation and healthcare associated pneumonia. He was treated with IV antibiotics, steroids and nebulized bronchodilators, respiratory viral panel was positive for parainfluenza.  Patient was also admitted in early April for spontaneous pneumothorax requiring chest tube. Patient says he is feeling improved, however, continues to feel weak and has a residual cough with minimum mucus production. Marland Kitchen He is back on his chronic steroids at prednisone 20 mg daily. He denies any hemoptysis, orthopnea, PND, or increased leg swelling Chest x-ray today shows chronic findings with no acute findings noted. X-ray was reviewed independently. Hospital records were reviewed in detail.    Past Medical History  Diagnosis Date  . Atrial fibrillation (HCC)   . Hyperlipidemia   . Headache(784.0)   . Osteopenia   . COPD (chronic obstructive pulmonary disease) (HCC)   . Allergic rhinitis   . History of echocardiogram     Echo 5/16:  EF 55-60%, no RWMA, Gr 1 DD  . Incontinence of urine     and stool  . Memory loss   . CVA (cerebral infarction)   . Shortness of breath dyspnea   . GERD (gastroesophageal reflux disease)   . Stroke [facility])    Current Outpatient Prescriptions on File Prior to Visit  Medication Sig Dispense Refill  . acetaminophen (TYLENOL) 325 MG tablet Take 650 mg by mouth every 6 (six) hours as needed for mild pain.     Marland Kitchen albuterol (PROAIR HFA) 108 (90 BASE) MCG/ACT inhaler Inhale 2 puffs into the lungs every 4 (four) hours as needed for wheezing or shortness of breath. For shortness of breath/wheezing 1 Inhaler prn  . albuterol (PROVENTIL) (2.5 MG/3ML) 0.083%  nebulizer solution USE ONE VIAL VIA NEBULIZER 4 TIMES DAILY (Patient taking differently: USE ONE VIAL VIA NEBULIZER 4 TIMES DAILY as needed for wheezing or shortness of breath) 120 mL 4  . alendronate (FOSAMAX) 70 MG tablet Take 70 mg by mouth every 7 (seven) days. Takes on Tuesday Take with a full glass of water on an empty stomach.    Marland Kitchen alfuzosin (UROXATRAL) 10 MG 24 hr tablet Take 1 tablet by mouth daily.  11  . ALPRAZolam (XANAX) 0.25 MG tablet Take 1 tablet (0.25 mg total) by mouth 2 (two) times daily as needed for anxiety or sleep. 60 tablet 0  . apixaban (ELIQUIS) 5 MG TABS tablet Take 1 tablet (5 mg total) by mouth 2 (two) times daily. 60 tablet 11  . atorvastatin (LIPITOR) 20 MG tablet Take 1 tablet (20 mg total) by mouth daily. 90 tablet 3  . Calcium-Magnesium-Vitamin D (ONE-A-DAY CALCIUM PLUS) 500-50-100 MG-MG-UNIT CHEW Chew 1 tablet by mouth 2 (two) times daily.      Marland Kitchen EPIPEN 2-PAK 0.3 MG/0.3ML SOAJ injection INJECT 0.3 MLS (0.3 MG TOTAL) INTO THE MUSCLE ONCE. FOR SEVERE SHORTNESS OF BREATH 1 Device 1  . furosemide (LASIX) 20 MG tablet Take 1 tablet (20 mg total) by mouth daily. 15 tablet 11  . guaiFENesin (MUCINEX) 600 MG 12 hr tablet Take 1-2 mg by mouth every 12 (twelve) hours as needed. For cough    . metFORMIN (GLUCOPHAGE) 500 MG tablet Take 1 tablet (500 mg total) by mouth  daily with breakfast. 30 tablet 0  . Multiple Vitamins-Minerals (CENTRUM SILVER PO) Take 1 tablet by mouth daily.      Marland Kitchen MYRBETRIQ 50 MG TB24 tablet Take 1 tablet by mouth daily.    . Nebulizers (COMPRESSOR/NEBULIZER) MISC Use as directed 1 each 0  . omeprazole (PRILOSEC) 20 MG capsule Take 1 capsule (20 mg total) by mouth 2 (two) times daily. 60 capsule 1  . OXYGEN Inhale 2-4 L into the lungs daily as needed (2 L for sleep and 4L for exertion).    . polyethylene glycol (MIRALAX / GLYCOLAX) packet Take 17 g by mouth daily as needed for mild constipation. 14 each 0  . predniSONE (DELTASONE) 20 MG tablet TAKE 1  TABLET (20 MG TOTAL) BY MOUTH DAILY. 30 tablet 5  . promethazine-codeine (PHENERGAN WITH CODEINE) 6.25-10 MG/5ML syrup TAKE 5 ML EVERY 6 HOURS AS NEEDED FOR COUGH 200 mL 0  . propafenone (RYTHMOL) 225 MG tablet TAKE ONE TABLET BY MOUTH TWICE A DAY 180 tablet 3  . saccharomyces boulardii (FLORASTOR) 250 MG capsule Take 1 capsule (250 mg total) by mouth 2 (two) times daily. 30 capsule 0  . Umeclidinium-Vilanterol (ANORO ELLIPTA) 62.5-25 MCG/INH AEPB Inhale 1 puff into the lungs daily. RINSE MOUTH WELL AFTER USE 3 each 3  . diltiazem (CARDIZEM LA) 240 MG 24 hr tablet Take 1 tablet (240 mg total) by mouth daily. (Patient not taking: Reported on 12/28/2015) 30 tablet 3  . HYDROcodone-acetaminophen (NORCO/VICODIN) 5-325 MG tablet Take 1 tablet by mouth every 6 (six) hours as needed for moderate pain. (Patient not taking: Reported on 12/28/2015) 30 tablet 0   No current facility-administered medications on file prior to visit.       Review of Systems Constitutional:   No  weight loss, night sweats,  Fevers, chills,  +fatigue, or  lassitude.  HEENT:   No headaches,  Difficulty swallowing,  Tooth/dental problems, or  Sore throat,                No sneezing, itching, ear ache,  +nasal congestion, post nasal drip, dry mouth  CV:  No chest pain,  Orthopnea, PND, swelling in lower extremities, anasarca, dizziness, palpitations, syncope.   GI  No heartburn, indigestion, abdominal pain, nausea, vomiting, diarrhea, change in bowel habits, loss of appetite, bloody stools.   Resp:  .  No chest wall deformity  Skin: no rash or lesions.  GU: no dysuria, change in color of urine, no urgency or frequency.  No flank pain, no hematuria   MS:  No joint pain or swelling.  No decreased range of motion.  No back pain.  Psych:  No change in mood or affect. No depression or anxiety.  No memory loss.         Objective:   Physical Exam Filed Vitals:   12/28/15 1545  BP: 138/68  Pulse: 108  Temp: 97.5 F  (36.4 C)  TempSrc: Oral  Height:  (1.778 m)  Weight: 162 lb (73.483 kg)  SpO2: 96%   GEN: A/Ox3; pleasant , NAD, Chronically ill appearing on oxygen.  HEENT:  [place]/AT,  EACs-clear, TMs-wnl, NOSE-clear, THROAT-clear, no lesions, no postnasal drip or exudate noted. Poor dentition  NECK:  Supple w/ fair ROM; no JVD; normal carotid impulses w/o bruits; no thyromegaly or nodules palpated; no lymphadenopathy.  RESP  decreased breath sounds in the bases , no accessory muscle use, no dullness to percussion  CARD:  RRR, no m/r/g  , trace peripheral edema,  pulses intact, no cyanosis or clubbing.  GI:   Soft & nt; nml bowel sounds; no organomegaly or masses detected.  Musco: Warm bil, no deformities or joint swelling noted.   Neuro: alert, no focal deficits noted.    Skin: Warm, no lesions or rashes  Zakyria Metzinger NP-C   Pulmonary and Critical Care  12/28/2015        Assessment & Plan:

## 2015-12-28 NOTE — Patient Instructions (Addendum)
Continue on current regimen .  Rinse well after BREO .  May use Mucinex Twice daily  As needed  Cough/congestion  May use Delsym 2 tsp Twice daily  As needed  Cough .  Continue on Oxygen .  Follow up with Dr. Maple [name] in June as planned.  Please contact office for sooner follow up if symptoms do not improve or worsen or seek emergency care

## 2015-12-28 NOTE — Assessment & Plan Note (Signed)
Spontaneous Pneumothorax requiring chest tube in early April with hospitalization. No recurrence on chest x-ray today

## 2015-12-29 ENCOUNTER — Other Ambulatory Visit: Payer: Self-pay | Admitting: Physician Assistant

## 2016-01-16 ENCOUNTER — Other Ambulatory Visit: Payer: Self-pay | Admitting: Neurology

## 2016-01-16 DIAGNOSIS — E785 Hyperlipidemia, unspecified: Secondary | ICD-10-CM

## 2016-01-16 MED ORDER — ATORVASTATIN CALCIUM 20 MG PO TABS: 20.0000 mg | ORAL_TABLET | Freq: Every day | ORAL | Status: DC

## 2016-01-24 ENCOUNTER — Encounter: Payer: Self-pay | Admitting: Internal Medicine

## 2016-01-24 ENCOUNTER — Ambulatory Visit (INDEPENDENT_AMBULATORY_CARE_PROVIDER_SITE_OTHER): Payer: Medicare Other | Admitting: Internal Medicine

## 2016-01-24 VITALS — BP 120/62 | HR 101 | Ht 70.0 in | Wt 164.0 lb

## 2016-01-24 DIAGNOSIS — F418 Other specified anxiety disorders: Secondary | ICD-10-CM | POA: Diagnosis not present

## 2016-01-24 DIAGNOSIS — I4891 Unspecified atrial fibrillation: Secondary | ICD-10-CM | POA: Diagnosis not present

## 2016-01-24 DIAGNOSIS — F32A Depression, unspecified: Secondary | ICD-10-CM

## 2016-01-24 DIAGNOSIS — I63411 Cerebral infarction due to embolism of right middle cerebral artery: Secondary | ICD-10-CM | POA: Diagnosis not present

## 2016-01-24 DIAGNOSIS — F329 Major depressive disorder, single episode, unspecified: Secondary | ICD-10-CM

## 2016-01-24 DIAGNOSIS — J449 Chronic obstructive pulmonary disease, unspecified: Secondary | ICD-10-CM | POA: Diagnosis not present

## 2016-01-24 DIAGNOSIS — J9611 Chronic respiratory failure with hypoxia: Secondary | ICD-10-CM | POA: Diagnosis not present

## 2016-01-24 DIAGNOSIS — J4489 Other specified chronic obstructive pulmonary disease: Secondary | ICD-10-CM

## 2016-01-24 DIAGNOSIS — F419 Anxiety disorder, unspecified: Secondary | ICD-10-CM

## 2016-01-24 MED ORDER — GLYCOPYRROLATE-FORMOTEROL 9-4.8 MCG/ACT IN AERO: 2.0000 | INHALATION_SPRAY | Freq: Two times a day (BID) | RESPIRATORY_TRACT | Status: AC

## 2016-01-24 MED ORDER — ALPRAZOLAM 0.25 MG PO TABS: 0.2500 mg | ORAL_TABLET | Freq: Two times a day (BID) | ORAL | Status: AC | PRN

## 2016-01-24 MED ORDER — AZITHROMYCIN 250 MG PO TABS: ORAL_TABLET | ORAL | Status: DC

## 2016-01-24 NOTE — Progress Notes (Signed)
Patient ID: Mark [name], male   DOB: 10/17/1940, 75 y.o.   MRN: [id number]  Patient seen in the office today and instructed on use of Bevespi.  Patient expressed understanding and demonstrated technique.

## 2016-01-24 NOTE — Progress Notes (Signed)
Patient ID: Mark [name], male    DOB: [birth year], 75 y.o.   MRN: [id number]  HPI 01/17/11- 1 yo with respiratory failure/ COPD, chronic AFib. Wife here Last here 11/04/10- Note reviewed Got a cold 2 weeks ago. Initally better. In last week-10 days has had more malaise, cough esp in last 3 days. Last antibiotic 2-3 months ago.  Went back up to 20 mg daily prednisone. Was never able to wean off last year when we tried. He has been on steroids many years..   05/07/11-  87 yo M former 3 PPD smoker with respiratory failure/ COPD, chronic AFib. Wife here No major changes. Uses nebulizer 3-5x/ day, infrequent use of rescue inhaler.Stays on prednisone 20 mg daily. He couldn't function at 15 mg daily.No recent infection. Fought off a mild cold a few weeks ago. He used his standby doxy for that and feels it helped. Daily mucinex. Daily cough with some phlegm. For flu vax today. Oxygen continuous- 1.5 - 2 L/M.  In past few weeks has noted pain down from right hip to ankle- discussed as likely pinched nerve/ compression fx from steroids. Spends much of each day sitting.   11/05/11-70 yo M former 3 PPD smoker with respiratory failure/ COPD/ steroid dependent, chronic AFib. Wife here. PCP Dr [name] Had chest cold at [facility] but finally better in past month. Remains on prednisone 20 mg daily. He has been able to be much more active, feeling better. Up and around more in house on O2 2 L/M with less need for wheelchair and with no acute issues. Denies infection, blood, chest pain. Had injections for back pain.   05/07/12- 38 yo M former 3 PPD smoker with respiratory failure/ COPD/ steroid dependent, chronic AFib.   Wife here.  PCP Dr [name] Denies any SOB, wheezing, cough, or congestion for about the past 2 weeks Remains on oxygen 3 L/[employer].  They plan to fly [facility] for a family wedding. I filled out the airplane form. They will be taking a portable concentrator. We discussed oxygen, altitude and  exertion. He remains steroid dependent 20 mg prednisone daily.  08/10/12- 30 yo M former 3 PPD smoker with respiratory failure/ COPD/ steroid dependent, chronic AFib.   Wife here.  PCP Dr [name] ACUTE VISIT: since Sunday having a hard time keep in O2 levels up even with O2; no energy and unable to do much activites. Came back in wheelchair and O2 was 86% on 3l/m cont. Had had a cold and started doxycycline yesterday. Denies fever,  chest pain, palpitation, little cough, no blood, no edema. Some pain across upper back and shoulders.  09/28/12- 69 yo M former 3 PPD smoker with respiratory failure/ COPD/ steroid dependent, chronic AFib.   Wife here.  Post Hospital: No more than usual SOB or wheezing since being in hospital prolonged stay 12/17-1/7 for prolonged R spontaneous PTX with chest tube.  Chest tube is still in with a drain, followed by Dr Donata Clay. Breathing is better. Antibiotics are finished. Continues oxygen 2 L. CT chest 08/26/12-  IMPRESSION:  1. Moderate right hydropneumothorax with right chest tube in  place, terminating in the anterior mediastinal region. Subcutaneous  emphysema along the right chest wall.  2. Postoperative changes in the right hemithorax with associated  soft tissue in the upper right hemithorax.  3. Centrilobular emphysema with possible superimposed airspace  disease in the right lower lobe. The latter is of unknown  chronicity and appears somewhat fibrotic.  Original Report Authenticated By: Leanna BattlesMelinda Blietz, M.D. CXR 09/29/12 IMPRESSION:  1. Stable right-sided chest tube with persistent right-sided  pneumothorax.  2. Persistent right midlung density.  3. Stable severe underlying emphysematous changes.  Original Report Authenticated By: Rudie MeyerP. Gallerani, M.D.  11/09/12- 75 yo M former 3 PPD smoker with respiratory failure/ COPD/ steroid dependent, chronic AFib.   Wife here.  FOLLOWS FOR: has had a hard time since last OV; was recently in hospital, admitted through  ED on 10/30/2012 when chest tube clotted. Fluid culture from tube was negative. He was treated for sepsis. Still feels weak/short of breath but pain is controlled. Little cough now and no fever or sweat CXR 11/03/12 IMPRESSION:  Stable position of the right chest tube and stable small right  pneumothorax.  Stable postsurgical changes in the right lung.  Original Report Authenticated By: Richarda OverlieAdam Henn, M.D.  6/181/473-  72 yo M former 3 PPD smoker with respiratory failure/ COPD/ steroid dependent, chronic AFib.   Wife here.  FOLLOWS FOR: continues to have SOB and gives out sooner Right chest tube has finally been removed and he says he is doing better O2 2-3 L/[employer]. Unable to wean prednisone below 20 mg daily CXR 12/22/12 IMPRESSION:  Interval removal of right chest tube. No evidence for residual  right-sided pneumothorax.  Postsurgical change in the right midlung with scarring noted at the  right base.  Original Report Authenticated By: Kennith CenterEric Mansell, M.D.  06/16/13-  75 yo M former 3 PPD smoker with respiratory failure/ COPD/ steroid dependent, chronic AFib.   Wife here.  follows for-4 month rtn.  Pt c/o SOB constantly, yellow mucous brought out by clearing throat.  Some sinus congestion. Needs nebulizer machine/[employer]. Continues prednisone 20 mg daily long-term. Oxygen 2 L continuous. Had flu shot  08/11/13-72 yo M former 3 PPD smoker with respiratory failure/ COPD/ steroid dependent, chronic AFib.   Wife here.  Pt c/o increased SOB, fatigue, chest pain, productive cough with yellow phlegm  x 4 days.  Pt is currently taking zpak on day 3. Had caught a cold one week ago. Feels weak, coughing yellow/white. Nebulizer machine is broken. Using his home oxygen flow pressure to drive his nebulizer medication, used for 5 times daily. Chronic prednisone 20 mg daily. He understands issues of osteoporosis, immunosuppression, adrenal insufficiency. CXR 08/11/13-  IMPRESSION:  No acute cardiopulmonary  disease. No change from the prior study.  11/16/13- 75 yo M former 3 PPD smoker with respiratory failure/ COPD/ steroid dependent, chronic AFib.   Wife here. FOLLOWS FOR: since having mobile scooter patient is able to get around more and states his breathing is doing well.  No recent infection.  Often some wheeze- took neb earlier. O2 2-3L/M/ [employer]. Maintenance prednisone 20 mg/ day-chronic.   03/23/14- 75 yo M former 3 PPD smoker with respiratory failure/ COPD/ steroid dependent, chronic AFib.   Wife here FOLLOWS FOR:  breathing has been fair per pt.  he stated that he is just not able to do anything.  He thinks he is gradually declining, which is what we would expect for his disease progression. No acute event. Occasional ankle edema has been better controlled.  O2 2-3L/M/ [employer]. Maintenance prednisone 20 mg/ day-chronic.   07/24/14- 75 yo M former 3 PPD smoker with respiratory failure/ COPD/ steroid dependent, chronic AFib.   Wife here FOLLOWS FOR: Not breathing as well as he would like. Continues O2 2-3L 24/7; DME is [facility]  11/22/14- 75 yo M former 3 PPD smoker with  respiratory failure/ COPD/ steroid dependent, chronic AFib.    FOLLOWS FOR: Continues to have SOB often; using O2 all the time O2 2-3L/M/ [employer]. Anoro refill script sent He feels his exercise tolerance is slowly declining. Does not blame pollen season. Morning cough with clear phlegm. Using nebulizer up to 3 times daily for modest benefit. No acute events.  03/26/15- 75 yo M former 3 PPD smoker with respiratory failure/ COPD/ steroid dependent, complicated by chronic AFib, CVA FOLLOWS FOR: Pt states he can get air in enough but does not feel like he is processing the air like he should. Continues oxygen 3 L continuous/[employer]. Little cough or wheeze. We discussed residual scarring after pneumothorax, chest tube, VATS in 2014 CXR 11/22/14 IMPRESSION: Stable chronic change. No active process. Electronically Signed  By: Dwyane DeePaul  Barry M.D.  On: 11/22/2014 10:14  07/26/2015-75 year old male former 3 pack per day smoker with respiratory failure/COPD/steroid dependent, complicated by chronic A. fib, CVA O2 3 L continuous/[employer] FOLLOWS FOR: Pt states he is having more SOB than usual; Using O2 all day through [facility]. Recent exacerbation-we sent Augmentin 5 days, codeine cough syrup, suggested increase prednisone dose on 07/23/2015  CXR 05/03/2015 IMPRESSION: Severe COPD changes and scarring with postsurgical changes of RIGHT hemi thorax. No definite acute abnormalities. Electronically Signed  By: Ulyses SouthwardMark Boles M.D.  On: 05/03/2015 15:12  01/24/2016-75 year old male former 3 pack per day smoker with respiratory failure/COPD/steroid dependent, complicated by chronic A. fib, CVA O2 3 L continuous/[employer]          Wife is here LOV 12/28/2015-hospital follow-up- [facility] . Had had another pneumothorax requiring chest tube April 2017  FOLLOWS FOR: Seen by TP on 12-28-15 for PNA; Pt states he feels about the same-cough, sinus drainage, congestion, Continues prednisone 20 mg long-term daily maintenance, Anoro  Review of Systems- see HPI Constitutional:   No-   weight loss, night sweats, fevers, chills, fatigue, lassitude. HEENT:   No-  headaches, difficulty swallowing, tooth/dental problems, sore throat,       No-  sneezing, itching, ear ache, nasal congestion, post nasal drip,  CV:  No-   chest pain, orthopnea, PND, no recent swelling in lower extremities, no-anasarca, dizziness, +palpitations Resp: + shortness of breath with exertion or at rest.              +cough,  No-  coughing up of blood.              No-   change in color of mucus.  +Slight wheezing.   Skin: No-   rash or lesions. GI:  No-   heartburn, indigestion, abdominal pain, nausea, vomiting, GU:  MS:  No-   joint pain or swelling.   Neuro- nothing unusual  Psych:  No- change in mood or affect. No depression or anxiety.  No memory  loss.  Objective:   Physical Exam General- Alert, Oriented, Affect-appropriate, Distress- none acute, +power chair, O2 3 L Skin- rash-none, lesions- none, excoriation- none. Steroid fragility and echymoses Lymphadenopathy- none Head- atraumatic            Eyes- Gross vision intact, PERRLA, conjunctivae clear secretions            Ears- Hearing, canals normal            Nose- Clear, No-Septal dev, mucus, polyps, erosion, perforation             Throat- Mallampati II , mucosa clear , drainage- none, tonsils- atrophic Neck- flexible , trachea midline, no stridor ,  thyroid nl, carotid no bruit Chest - symmetrical excursion , unlabored           Heart/CV-+ IRR/ chronic AFib (no pacemaker) , no murmur , no gallop , no rub, nl s1 s2                            JVD- none , edema + 1-2, stasis changes- none, varices- none           Lung- +Very distant bilaterally, w/ no wheeze,  Cough-none ,  dullness-none, rub-none,  +Pursed lips. + Bilateral breath sounds less on the right           Chest wall-  Abd- Br/ Gen/ Rectal- Not done, not indicated Extrem- +power wheelchair Neuro- grossly intact to observation

## 2016-01-24 NOTE — Patient Instructions (Addendum)
Sample x 2 Bevespi maintenance inhaler     Inhale 2 puffs, twice daily     Try this instead of Anoro   When you run out of the Bevespi, go back to Anoro for comparison.      Script printed for aprazolam/ Xanax  Script for [employer] pak printed to hold

## 2016-01-25 ENCOUNTER — Other Ambulatory Visit: Payer: Self-pay | Admitting: Physician Assistant

## 2016-01-27 DIAGNOSIS — F329 Major depressive disorder, single episode, unspecified: Secondary | ICD-10-CM | POA: Insufficient documentation

## 2016-01-27 DIAGNOSIS — F419 Anxiety disorder, unspecified: Secondary | ICD-10-CM

## 2016-01-27 DIAGNOSIS — J9611 Chronic respiratory failure with hypoxia: Secondary | ICD-10-CM | POA: Insufficient documentation

## 2016-01-27 NOTE — Assessment & Plan Note (Signed)
Chronic situational without suicidal ideation Plan-I agreed to refill Xanax

## 2016-01-27 NOTE — Assessment & Plan Note (Signed)
He remains dependent on maintenance steroids, continuous oxygen, and use of his power chair. Plan-for comparison try replacing an oral with samples of Bevespi

## 2016-01-27 NOTE — Assessment & Plan Note (Signed)
Pulse about 100. No pacemaker. He continues followed by cardiology

## 2016-03-13 ENCOUNTER — Telehealth: Payer: Self-pay | Admitting: Internal Medicine

## 2016-03-13 MED ORDER — FUROSEMIDE 20 MG PO TABS: 20.0000 mg | ORAL_TABLET | Freq: Every day | ORAL | Status: AC

## 2016-03-13 NOTE — Telephone Encounter (Signed)
New message   Pt feet are swelling.   *STAT* If patient is at the pharmacy, call can be transferred to refill team.   1. Which medications need to be refilled? (please list name of each medication and dose if known) furosemide 20mg   2. Which pharmacy/location (including street and city if local pharmacy) is medication to be sent to?[facility] [address]  3. Do they need a 30 day or 90 day supply? 90

## 2016-03-13 NOTE — Telephone Encounter (Signed)
I spoke with the patient's wife. He is having some swelling in his feet, but has been out of lasix. He is currently taking lasix 20 mg once daily. I advised his wife I will send in his RX.

## 2016-04-02 ENCOUNTER — Other Ambulatory Visit: Payer: Self-pay | Admitting: Internal Medicine

## 2016-04-21 ENCOUNTER — Telehealth: Payer: Self-pay | Admitting: Internal Medicine

## 2016-04-21 MED ORDER — ALBUTEROL SULFATE (2.5 MG/3ML) 0.083% IN NEBU: INHALATION_SOLUTION | RESPIRATORY_TRACT | 6 refills | Status: AC

## 2016-04-21 NOTE — Telephone Encounter (Signed)
Spoke with pharmacist at [facility] and given refill for Albuterol neb.  Nothing further needed.

## 2016-05-16 ENCOUNTER — Ambulatory Visit (INDEPENDENT_AMBULATORY_CARE_PROVIDER_SITE_OTHER): Payer: Medicare Other

## 2016-05-16 DIAGNOSIS — Z23 Encounter for immunization: Secondary | ICD-10-CM

## 2016-05-19 ENCOUNTER — Other Ambulatory Visit: Payer: Self-pay | Admitting: Internal Medicine

## 2016-05-19 MED ORDER — UMECLIDINIUM-VILANTEROL 62.5-25 MCG/INH IN AEPB: 1.0000 | INHALATION_SPRAY | Freq: Every day | RESPIRATORY_TRACT | 5 refills | Status: DC

## 2016-05-19 NOTE — Telephone Encounter (Signed)
Per CY-okay to give Rx for Anoro #1 1 inhalation

## 2016-05-23 ENCOUNTER — Ambulatory Visit (INDEPENDENT_AMBULATORY_CARE_PROVIDER_SITE_OTHER): Payer: Medicare Other | Admitting: Internal Medicine

## 2016-05-23 ENCOUNTER — Encounter: Payer: Self-pay | Admitting: Internal Medicine

## 2016-05-23 VITALS — BP 122/64 | HR 108 | Ht 70.0 in | Wt 162.6 lb

## 2016-05-23 DIAGNOSIS — I491 Atrial premature depolarization: Secondary | ICD-10-CM

## 2016-05-23 DIAGNOSIS — I5030 Unspecified diastolic (congestive) heart failure: Secondary | ICD-10-CM | POA: Diagnosis not present

## 2016-05-23 DIAGNOSIS — I63411 Cerebral infarction due to embolism of right middle cerebral artery: Secondary | ICD-10-CM | POA: Diagnosis not present

## 2016-05-23 DIAGNOSIS — R000 Tachycardia, unspecified: Secondary | ICD-10-CM

## 2016-05-23 DIAGNOSIS — E785 Hyperlipidemia, unspecified: Secondary | ICD-10-CM

## 2016-05-23 DIAGNOSIS — I493 Ventricular premature depolarization: Secondary | ICD-10-CM

## 2016-05-23 DIAGNOSIS — I480 Paroxysmal atrial fibrillation: Secondary | ICD-10-CM | POA: Diagnosis not present

## 2016-05-23 DIAGNOSIS — Z789 Other specified health status: Secondary | ICD-10-CM

## 2016-05-23 DIAGNOSIS — Z515 Encounter for palliative care: Secondary | ICD-10-CM

## 2016-05-23 MED ORDER — ATORVASTATIN CALCIUM 20 MG PO TABS: 20.0000 mg | ORAL_TABLET | Freq: Every day | ORAL | 3 refills | Status: AC

## 2016-05-23 NOTE — Progress Notes (Signed)
Patient Care Team: [name], MD as PCP - General (Family Medicine)   HPI  Mark [name] is a 75 y.o. male Seen in followup for paroxysmal atrial fibrillation in the setting of severe COPD. He has taken propafenone in the past  His complaints of ongoing worsening dyspnea. He does have some peripheral edema.  He improved with the introduction of a low-dose diuretic we will decrease in edema and improved dyspnea.  In the past we discussed anticoagulation and he has been reluctant to undertake anticoagulants He was seen 5/16 at which time he was noted to have had an ischemic stroke. Seen by neurology. Anticoagulation with apixaban had been started.   He   had spells where in his O2 sat machine he is reported heart rates into the 40s. This is associated with increasing fatigue.  The continued use of an event recorder to try to identify what was causing the measure her heart rate of 40. Recordings demonstrated only sinus rhythm with some PVCs and a pattern of bigeminy. He also had some PVCs but no tracking..   Past Medical History:  Diagnosis Date  . Allergic rhinitis   . Atrial fibrillation (HCC)   . COPD (chronic obstructive pulmonary disease) (HCC)   . CVA (cerebral infarction)   . GERD (gastroesophageal reflux disease)   . Headache(784.0)   . History of echocardiogram    Echo 5/16:  EF 55-60%, no RWMA, Gr 1 DD  . Hyperlipidemia   . Incontinence of urine    and stool  . Memory loss   . Osteopenia   . Shortness of breath dyspnea   . Stroke [facility])     Past Surgical History:  Procedure Laterality Date  . APPENDECTOMY  1958  . CATARACT EXTRACTION, BILATERAL    . catheter ablation    . INGUINAL HERNIA REPAIR  1975, 1992  . RESECTION OF APICAL BLEB  08/17/2012   Procedure: RESECTION OF APICAL BLEB;  Surgeon: [name], MD;  Location: [facility] OR;  Service: Thoracic;  Laterality: Right;  stapling of bleb  . VIDEO ASSISTED THORACOSCOPY  08/17/2012   Procedure:  VIDEO ASSISTED THORACOSCOPY;  Surgeon: [name], MD;  Location: [facility] OR;  Service: Thoracic;  Laterality: Right;  Marland Kitchen VIDEO ASSISTED THORACOSCOPY (VATS)/THOROCOTOMY  08/17/2012   resection / stapling of blebs, mechanical pleurodesis   . VIDEO BRONCHOSCOPY  08/27/2012   Procedure: VIDEO BRONCHOSCOPY;  Surgeon: [name], MD;  Location: [facility] OR;  Service: Thoracic;  Laterality: N/A;  evaluation of endobronchial valves    Current Outpatient Prescriptions  Medication Sig Dispense Refill  . acetaminophen (TYLENOL) 325 MG tablet Take 650 mg by mouth every 6 (six) hours as needed for mild pain.     Marland Kitchen albuterol (PROAIR HFA) 108 (90 BASE) MCG/ACT inhaler Inhale 2 puffs into the lungs every 4 (four) hours as needed for wheezing or shortness of breath. For shortness of breath/wheezing 1 Inhaler prn  . albuterol (PROVENTIL) (2.5 MG/3ML) 0.083% nebulizer solution USE ONE VIAL VIA NEBULIZER 4 TIMES DAILY 120 mL 6  . alendronate (FOSAMAX) 70 MG tablet Take 70 mg by mouth every 7 (seven) days. Takes on Tuesday Take with a full glass of water on an empty stomach.    Marland Kitchen alfuzosin (UROXATRAL) 10 MG 24 hr tablet Take 1 tablet by mouth daily.  11  . ALPRAZolam (XANAX) 0.25 MG tablet Take 1 tablet (0.25 mg total) by mouth 2 (two) times daily as needed for  anxiety or sleep. 60 tablet 5  . atorvastatin (LIPITOR) 20 MG tablet Take 1 tablet (20 mg total) by mouth daily. 90 tablet 3  . azithromycin (ZITHROMAX) 250 MG tablet 2 today then one daily 6 tablet 3  . Calcium-Magnesium-Vitamin D (ONE-A-DAY CALCIUM PLUS) 500-50-100 MG-MG-UNIT CHEW Chew 1 tablet by mouth 2 (two) times daily.      Marland Kitchen. ELIQUIS 5 MG TABS tablet TAKE 1 TABLET (5 MG TOTAL) BY MOUTH 2 (TWO) TIMES DAILY. 60 tablet 11  . EPIPEN 2-PAK 0.3 MG/0.3ML SOAJ injection INJECT 0.3 MLS (0.3 MG TOTAL) INTO THE MUSCLE ONCE. FOR SEVERE SHORTNESS OF BREATH 1 Device 1  . furosemide (LASIX) 20 MG tablet Take 1 tablet (20 mg total) by mouth daily. 90 tablet 3  .  Glycopyrrolate-Formoterol (BEVESPI AEROSPHERE) 9-4.8 MCG/ACT AERO Inhale 2 puffs into the lungs 2 (two) times daily. 1 Inhaler 0  . guaiFENesin (MUCINEX) 600 MG 12 hr tablet Take 1-2 mg by mouth every 12 (twelve) hours as needed. For cough    . Multiple Vitamins-Minerals (CENTRUM SILVER PO) Take 1 tablet by mouth daily.      Marland Kitchen. MYRBETRIQ 50 MG TB24 tablet Take 1 tablet by mouth daily.    . Nebulizers (COMPRESSOR/NEBULIZER) MISC Use as directed 1 each 0  . omeprazole (PRILOSEC) 20 MG capsule Take 1 capsule (20 mg total) by mouth 2 (two) times daily. 60 capsule 1  . OXYGEN Inhale 2-4 L into the lungs daily as needed (2 L for sleep and 4L for exertion).    . polyethylene glycol (MIRALAX / GLYCOLAX) packet Take 17 g by mouth daily as needed for mild constipation. 14 each 0  . predniSONE (DELTASONE) 20 MG tablet TAKE 1 TABLET (20 MG TOTAL) BY MOUTH DAILY. 30 tablet 5  . promethazine-codeine (PHENERGAN WITH CODEINE) 6.25-10 MG/5ML syrup TAKE 5 ML EVERY 6 HOURS AS NEEDED FOR COUGH 200 mL 0  . propafenone (RYTHMOL) 225 MG tablet TAKE ONE TABLET BY MOUTH TWICE A DAY 180 tablet 3  . umeclidinium-vilanterol (ANORO ELLIPTA) 62.5-25 MCG/INH AEPB Inhale 1 puff into the lungs daily. RINSE MOUTH WELL AFTER USE 1 each 5  . verapamil (CALAN-SR) 240 MG CR tablet Take 1/2 tablet by mouth daily     No current facility-administered medications for this visit.     Allergies  Allergen Reactions  . Zolpidem Tartrate Other (See Comments)    disoriented  . Shrimp [Shellfish Allergy] Diarrhea and Nausea And Vomiting  . Diltiazem Other (See Comments)    Feet swelling, fatigued    Review of Systems negative except from HPI and PMH  Physical Exam BP 122/64   Pulse (!) 108   Ht 5\' 10"  (1.778 m)   Wt 162 lb 9.6 oz (73.8 kg)   SpO2 92%   BMI 23.33 kg/m  Well developed and well nourished wearing oxygen in mod resp   Distress pursed  lip breathing HENT normal E scleral and icterus clear supple  Markedly decreased  breath sounds right compared to left across the entire posterior back Rapid but Regular rate and rhythm, no murmurs gallops or rub Soft with active bowel sounds No clubbing cyanosis  Edema Alert and oriented, grossly normal motor and sensory function Skin Warm and Dry  Event recorder demonstrated PVCs and PACs with no significant arrhythmia  Assessment and  Plan  HFpEF   Oxygen-dependent COPD   PVCs/PACs   Sinus tachycardia   Atrial Fib  He continues to struggle with shortness of breath. This is apparently  worsening.   When I asked him how we might best help him today, he said "turn out the lights". That prompted me to discuss with him about end-of-life care. We discussed DO NOT RESUSCITATE. We discussed palliative care he would like the latter; he would like to discuss both of these with [name] when he sees him in a couple of weeks..    On Anticoagulation;  No bleeding issues   More than 50% of 45 min was spent in counseling related to the above

## 2016-05-23 NOTE — Patient Instructions (Signed)
Medication Instructions: Your physician recommends that you continue on your current medications as directed. Please refer to the Current Medication list given to you today.   Labwork: None Ordered  Procedures/Testing: None Ordered  Follow-Up: Your physician wants you to follow-up in: 1 YEAR with Dr. Klein. You will receive a reminder letter in the mail two months in advance. If you don't receive a letter, please call our office to schedule the follow-up appointment.   Any Additional Special Instructions Will Be Listed Below (If Applicable).     If you need a refill on your cardiac medications before your next appointment, please call your pharmacy.   

## 2016-06-04 ENCOUNTER — Encounter: Payer: Self-pay | Admitting: Internal Medicine

## 2016-06-04 ENCOUNTER — Ambulatory Visit (INDEPENDENT_AMBULATORY_CARE_PROVIDER_SITE_OTHER): Payer: Medicare Other | Admitting: Internal Medicine

## 2016-06-04 VITALS — BP 118/66 | HR 97 | Ht 70.0 in | Wt 166.4 lb

## 2016-06-04 DIAGNOSIS — Z7189 Other specified counseling: Secondary | ICD-10-CM

## 2016-06-04 DIAGNOSIS — I63411 Cerebral infarction due to embolism of right middle cerebral artery: Secondary | ICD-10-CM

## 2016-06-04 DIAGNOSIS — J449 Chronic obstructive pulmonary disease, unspecified: Secondary | ICD-10-CM

## 2016-06-04 MED ORDER — PREDNISONE 20 MG PO TABS: ORAL_TABLET | ORAL | 12 refills | Status: DC

## 2016-06-04 MED ORDER — UMECLIDINIUM-VILANTEROL 62.5-25 MCG/INH IN AEPB: INHALATION_SPRAY | RESPIRATORY_TRACT | 3 refills | Status: AC

## 2016-06-04 NOTE — Patient Instructions (Addendum)
We are marking the record to indicate a DNR ( Do not resuscitate, Do not intubate) choice.  Order- referral to Palliative Care and to Hospice for end of life support discussions to see which feels like the best fit for now.  Script to refill Anoro inhaler. Stop using the Bevespi inhaler.  Script sent to refill prednisone so you can take 1, 1 and a half, or 2 tabs daily

## 2016-06-04 NOTE — Progress Notes (Signed)
Patient ID: Mark [name], male    DOB: 1941/03/07, 75 y.o.   MRN: [id number]  HPI M former smoker with respiratory failure/ COPD, chronic AFib, recurrent pneumothoraces/ chest tubes   07/26/2015-75 year old male former 3 pack per day smoker with respiratory failure/COPD/steroid dependent, complicated by chronic A. fib, CVA O2 3 L continuous/[employer] FOLLOWS FOR: Pt states he is having more SOB than usual; Using O2 all day through [employer]. Recent exacerbation-we sent Augmentin 5 days, codeine cough syrup, suggested increase prednisone dose on 07/23/2015 CXR 05/03/2015 IMPRESSION: Severe COPD changes and scarring with postsurgical changes of RIGHT hemi thorax. No definite acute abnormalities. Electronically Signed  By: Ulyses Southward M.D.  On: 05/03/2015 15:12  01/24/2016-75 year old male former 3 pack per day smoker with respiratory failure/COPD/steroid dependent, complicated by chronic A. fib, CVA O2 3 L continuous/[employer]          Wife is here LOV 12/28/2015-hospital follow-up- [facility] . Had had another pneumothorax requiring chest tube April 2017  FOLLOWS FOR: Seen by TP on 12-28-15 for PNA; Pt states he feels about the same-cough, sinus drainage, congestion, Continues prednisone 20 mg long-term daily maintenance, Anoro  06/04/2016-75 year old male former 3 pack per day smoker followed for chronic respiratory failure, COPD/steroid dependent, recurrent pneumothoraces/chest tubes, complicated by chronic A. fib, CVA, GERD O2 3-4 L continuous/[employer]  Wife is here Motorized wheelchair FOLLOWS FOR: Pt states he is having worsened breathing since 01-24-16 visit-using 4lpm cont O2 now. Pt is having more SOB and wheezing.  He feels more trouble "getting air out". Little cough or phlegm. Usually needing 4 L oxygen now for comfort. Spends all of his time in his power chair with almost no exercise tolerance independently. Dr. [name] had preliminary discussion about Hospice resources and Palliative  care. We extended that discussion and discussed resuscitation, end-of-life, Do Not Resuscitate orders, options and ability to change decisions later. I encouraged them to include children in the discussion so everybody would be on the same page. Anoro equivalent to [employer]. Needs prednisone up to 40 mg daily at times. Discussed steroid side effects again. CXR 12/28/15-  Stable cardiomediastinal silhouette. No pneumothorax or pleural effusion is noted. Stable emphysematous disease is noted in both upper lobes. Postsurgical changes are noted in right upper lobe. No acute pulmonary disease is noted. Bony thorax is unremarkable. IMPRESSION: Stable chronic findings as described above. No acute abnormality seen. Electronically Signed   By: Lupita Raider, M.D.   On: 12/28/2015 15:37  Review of Systems- see HPI Constitutional:   No-   weight loss, night sweats, fevers, chills, fatigue, lassitude. HEENT:   No-  headaches, difficulty swallowing, tooth/dental problems, sore throat,       No-  sneezing, itching, ear ache, nasal congestion, post nasal drip,  CV:  No-   chest pain, orthopnea, PND, no recent swelling in lower extremities, no-anasarca, dizziness, +palpitations Resp: + shortness of breath with exertion or at rest.              +cough,  No-  coughing up of blood.              No-   change in color of mucus.  +Slight wheezing.   Skin: No-   rash or lesions. GI:  No-   heartburn, indigestion, abdominal pain, nausea, vomiting, GU:  MS:  No-   joint pain or swelling.   Neuro- nothing unusual  Psych:  No- change in mood or affect. No depression or anxiety.  No memory  loss.  Objective:   Physical Exam General- Alert, Oriented, Affect-appropriate, Distress- none acute, +power chair, O2 4 L Skin- + scaling eczema on the scalp. +Steroid fragility and echymoses Lymphadenopathy- none Head- atraumatic            Eyes- Gross vision intact, PERRLA, conjunctivae clear secretions             Ears- Hearing, canals normal            Nose- Clear, No-Septal dev, mucus, polyps, erosion, perforation             Throat- Mallampati II , mucosa clear , drainage- none, tonsils- atrophic Neck- flexible , trachea midline, no stridor , thyroid nl, carotid no bruit Chest - symmetrical excursion , unlabored           Heart/CV-+ IRR/ chronic AFib (no pacemaker) , no murmur , no gallop , no rub, nl s1 s2                            JVD- none , edema + 1-2, stasis changes- none, varices- none           Lung- +Very distant bilaterally, w/ no wheeze,  Cough-none ,  dullness-none, rub-none,  +Pursed lips.            Chest wall-  Abd- Br/ Gen/ Rectal- Not done, not indicated Extrem- +power wheelchair Neuro- grossly intact to observation

## 2016-06-04 NOTE — Assessment & Plan Note (Signed)
At this point he and his wife understand end-of-life. They need to include daughter in discussion. They agreed to meet with representatives from Palliative care and Hospice for better understanding of the available resources. Resuscitation now limited to medications and chest tube placement if he has another pneumothorax. No intubation, no cardiac shock. They understand they have the right change their minds.

## 2016-06-05 ENCOUNTER — Telehealth: Payer: Self-pay | Admitting: Internal Medicine

## 2016-06-05 NOTE — Telephone Encounter (Signed)
Hospice nurse calling to request permission from Dr. Maple HudsonYoung to allow one of their doctors to sign a DNR for this patient.  States that patient has been admitted and they need to take the DNR to the hospital for patient.  Dr. Maple HudsonYoung, please advise.

## 2016-06-05 NOTE — Telephone Encounter (Signed)
Spoke with Rosey Batheresa, hospice RN, aware of CY's recs.  Nothing further needed.

## 2016-06-05 NOTE — Telephone Encounter (Signed)
Per CY-okay for hospice MD to sign DNR, as well as hospice to write Rx's for patient. Please make sure they understand that CY will be attending MD. Thanks.

## 2016-06-09 ENCOUNTER — Telehealth: Payer: Self-pay | Admitting: Internal Medicine

## 2016-06-09 NOTE — Telephone Encounter (Signed)
Velvet Batheshley L Caulfield, CMA    06/05/16 3:20 PM  Note    Spoke with Rosey Batheresa, hospice RN, aware of CY's recs.  Nothing further needed.          06/05/16 3:11 PM  Ronny BaconKatie C Welchel, CMA routed this conversation to [facility] Triage Pool   Ronny BaconKatie C Welchel, CMA      06/05/16 3:10 PM  Note    Per CY-okay for hospice MD to sign DNR, as well as hospice to write Rx's for patient. Please make sure they understand that CY will be attending MD. Thanks.      ------------------------ Already given information to another hospice nurse Rosey Bath(Teresa).  Attempted to contact Willette PaJulie Brown, left message on voicemail.

## 2016-06-10 ENCOUNTER — Encounter: Payer: Self-pay | Admitting: Neurology

## 2016-06-10 ENCOUNTER — Other Ambulatory Visit: Payer: Self-pay | Admitting: Internal Medicine

## 2016-06-10 ENCOUNTER — Ambulatory Visit (INDEPENDENT_AMBULATORY_CARE_PROVIDER_SITE_OTHER): Admitting: Neurology

## 2016-06-10 VITALS — BP 115/72 | HR 57 | Ht 70.0 in | Wt 168.0 lb

## 2016-06-10 DIAGNOSIS — I480 Paroxysmal atrial fibrillation: Secondary | ICD-10-CM

## 2016-06-10 DIAGNOSIS — I63411 Cerebral infarction due to embolism of right middle cerebral artery: Secondary | ICD-10-CM | POA: Diagnosis not present

## 2016-06-10 DIAGNOSIS — J9611 Chronic respiratory failure with hypoxia: Secondary | ICD-10-CM

## 2016-06-10 DIAGNOSIS — F329 Major depressive disorder, single episode, unspecified: Secondary | ICD-10-CM | POA: Insufficient documentation

## 2016-06-10 DIAGNOSIS — F32A Depression, unspecified: Secondary | ICD-10-CM

## 2016-06-10 DIAGNOSIS — Z7901 Long term (current) use of anticoagulants: Secondary | ICD-10-CM

## 2016-06-10 MED ORDER — SERTRALINE HCL 25 MG PO TABS: 50.0000 mg | ORAL_TABLET | Freq: Every day | ORAL | 2 refills | Status: DC

## 2016-06-10 NOTE — Telephone Encounter (Signed)
Called and spoke to TrevoseJulie with Hospice and relayed the recs per CY. Raynelle FanningJulie verbalized understanding and denied any further questions or concerns at this time.

## 2016-06-10 NOTE — Progress Notes (Signed)
STROKE NEUROLOGY FOLLOW UP NOTE  NAME: Mark [name] DOB: [birth year]  REASON FOR VISIT: stroke follow up HISTORY FROM: pt and wife and chart  Today we had the pleasure of seeing Mark [name] in follow-up at our Neurology Clinic. Pt was accompanied by wife.   History Summary Mark [name] is a 75 y.o. male with PMH of PAF not on AC, severe COPD with O2 dependent. He stated that in 11/2014, he was in a restaurant and had sudden onset word finding difficulties for 3-4 min and resolved. However, after that, wife found him to have intermittent confusion with remote control, dial phone numbers, slow in response to questions and mess up with date or time. Denies any weakness, numbness, LOC, dizziness, swallowing difficulty or seizure. He went to see PCP 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 He was seen by cardiology on 01/12/15 and had 2D ehco which was unremarkable. He was put on eliquis.  He was found to have PAF in 2012 when he was admitted for pneumonia, and was put on coumadin briefly. However, he developed bruise at his arms and he optioned to be off AC. He was on ASA since then. He also had aflutter and s/p ablation. He was told that his aflutter was cured but still has afib. He still has severe COPD and needs home O2, he is following with [name] and on steroids.  He is a former smoker, quit many years ago. Occasionally drink and denies illicit drugs.  04/05/2015 follow-up- the patient has been doing the same. No recurrent stroke like symptoms. Had MRA head and neck, confirmed right ICA occlusion and right MCA via collaterals. However, also showed right VA and right M2 high grade stenosis. Still has COPD on O2. BP today 128/70 and advised to avoid hypotension. Still on eliquis and lipitor, tolerating well.   08/06/2015 admitted for unwitnessed fall.  Patient cannot remember a steady but happened , concerning for syncope  versus seizure. EEG normal. He was put on Keppra due to history for stroke and the risk of seizure.  MRI showed chronic right frontal MCA infarct, no acute stroke.  Eliquis continued and he was discharged with Keppra.  12/14/15 follow up - Patient follow up with Dr. Epimenio Foot on 08/15/15 in clinic and felt the presentation did not consistent with seizure and keppra was discontinued.  Patient was again admitted on 11/20/2015 due to pneumothorax for which he received chest tube and later removed.  He has a follow-up with cardiology and cardiovascular surgeon since discharge. Continued on Eliquis. Still need home oxygen. Blood pressure 102/59 on the low side.  Interval History During the interval time, pt has been doing well from stroke standpoint. However, he still has respiratory issue and recent visit with [name] and initiated hospice care at home. His prednisone also increased and he felt better. He stated that sometimes he woke up gasping for air and he thought about suicide by falling out of bed but never attempted. However, since prednisone dose increase his breathing much improved and he has no suicidal thoughts now. BP 115/72 today.   Past Medical History:  Diagnosis Date  . Allergic rhinitis   . Atrial fibrillation (HCC)   . COPD (chronic obstructive pulmonary disease) (HCC)   . CVA (cerebral infarction)   . GERD (gastroesophageal reflux disease)   . Headache(784.0)   . History of echocardiogram    Echo 5/16:  EF 55-60%, no RWMA, Gr 1 DD  . Hyperlipidemia   . Incontinence of urine    and stool  . Memory loss   . Osteopenia   . Shortness of breath dyspnea   . Stroke [facility])    Past Surgical History:  Procedure Laterality Date  . APPENDECTOMY  1958  . CATARACT EXTRACTION, BILATERAL    . catheter ablation    . INGUINAL HERNIA REPAIR  1975, 1992  . RESECTION OF APICAL BLEB  08/17/2012   Procedure: RESECTION OF APICAL BLEB;  Surgeon: [name], MD;  Location: [facility] OR;  Service:  Thoracic;  Laterality: Right;  stapling of bleb  . VIDEO ASSISTED THORACOSCOPY  08/17/2012   Procedure: VIDEO ASSISTED THORACOSCOPY;  Surgeon: [name], MD;  Location: [facility] OR;  Service: Thoracic;  Laterality: Right;  Marland Kitchen VIDEO ASSISTED THORACOSCOPY (VATS)/THOROCOTOMY  08/17/2012   resection / stapling of blebs, mechanical pleurodesis   . VIDEO BRONCHOSCOPY  08/27/2012   Procedure: VIDEO BRONCHOSCOPY;  Surgeon: [name], MD;  Location: [facility] OR;  Service: Thoracic;  Laterality: N/A;  evaluation of endobronchial valves   Family History  Problem Relation Age of Onset  . Heart attack Father   . COPD Father   . Lung cancer Father   . COPD Brother   . COPD Sister   . Stroke Mother   . Hypertension Brother   . Throat cancer Brother    Current Outpatient Prescriptions  Medication Sig Dispense Refill  . acetaminophen (TYLENOL) 325 MG tablet Take 650 mg by mouth every 6 (six) hours as needed for mild pain.     Marland Kitchen albuterol (PROAIR HFA) 108 (90 BASE) MCG/ACT inhaler Inhale 2 puffs into the lungs every 4 (four) hours as needed for wheezing or shortness of breath. For shortness of breath/wheezing 1 Inhaler prn  . albuterol (PROVENTIL) (2.5 MG/3ML) 0.083% nebulizer solution USE ONE VIAL VIA NEBULIZER 4 TIMES DAILY 120 mL 6  . alendronate (FOSAMAX) 70 MG tablet Take 70 mg by mouth every 7 (seven) days. Takes on Tuesday Take with a full glass of water on an empty stomach.    Marland Kitchen alfuzosin (UROXATRAL) 10 MG 24 hr tablet Take 1 tablet by mouth daily.  11  . ALPRAZolam (XANAX) 0.25 MG tablet Take 1 tablet (0.25 mg total) by mouth 2 (two) times daily as needed for anxiety or sleep. 60 tablet 5  . atorvastatin (LIPITOR) 20 MG tablet Take 1 tablet (20 mg total) by mouth daily. 90 tablet 3  . Calcium-Magnesium-Vitamin D (ONE-A-DAY CALCIUM PLUS) 500-50-100 MG-MG-UNIT CHEW Chew 1 tablet by mouth 2 (two) times daily.      Marland Kitchen ELIQUIS 5 MG TABS tablet TAKE 1 TABLET (5 MG TOTAL) BY MOUTH 2 (TWO) TIMES DAILY. 60  tablet 11  . EPIPEN 2-PAK 0.3 MG/0.3ML SOAJ injection INJECT 0.3 MLS (0.3 MG TOTAL) INTO THE MUSCLE ONCE. FOR SEVERE SHORTNESS OF BREATH 1 Device 1  . furosemide (LASIX) 20 MG tablet Take 1 tablet (20 mg total) by mouth daily. 90 tablet 3  . Glycopyrrolate-Formoterol (BEVESPI AEROSPHERE) 9-4.8 MCG/ACT AERO Inhale 2 puffs into the lungs 2 (two) times daily. 1 Inhaler 0  . guaiFENesin (MUCINEX) 600 MG 12 hr tablet Take 1-2 mg by mouth every 12 (twelve) hours as needed. For cough    . Multiple Vitamins-Minerals (CENTRUM SILVER PO) Take 1 tablet by mouth daily.      Marland Kitchen MYRBETRIQ 50 MG TB24 tablet Take 1 tablet by mouth daily.    Marland Kitchen  Nebulizers (COMPRESSOR/NEBULIZER) MISC Use as directed 1 each 0  . omeprazole (PRILOSEC) 20 MG capsule Take 1 capsule (20 mg total) by mouth 2 (two) times daily. 60 capsule 1  . OXYGEN Inhale 2-4 L into the lungs daily as needed (2 L for sleep and 4L for exertion).    . polyethylene glycol (MIRALAX / GLYCOLAX) packet Take 17 g by mouth daily as needed for mild constipation. 14 each 0  . predniSONE (DELTASONE) 20 MG tablet 1 -2 tabs daily 60 tablet 12  . promethazine-codeine (PHENERGAN WITH CODEINE) 6.25-10 MG/5ML syrup TAKE 5 ML EVERY 6 HOURS AS NEEDED FOR COUGH 200 mL 0  . propafenone (RYTHMOL) 225 MG tablet TAKE ONE TABLET BY MOUTH TWICE A DAY 180 tablet 3  . umeclidinium-vilanterol (ANORO ELLIPTA) 62.5-25 MCG/INH AEPB Inhale 1 puff, once every day 3 each 3  . verapamil (CALAN-SR) 240 MG CR tablet Take 1/2 tablet by mouth daily    . sertraline (ZOLOFT) 25 MG tablet Take 2 tablets (50 mg total) by mouth daily. 30 tablet 2   No current facility-administered medications for this visit.    Allergies  Allergen Reactions  . Zolpidem Tartrate Other (See Comments)    disoriented  . Shrimp [Shellfish Allergy] Diarrhea and Nausea And Vomiting  . Diltiazem Other (See Comments)    Feet swelling, fatigued   Social History   Social History  . Marital status: Married     Spouse name: Windy Kalata  . Number of children: 6  . Years of education: 8   Occupational History  . Retired Retired    Air traffic controller   Social History Main Topics  . Smoking status: Former Smoker    Packs/day: 3.00    Years: 15.00    Types: Cigarettes    Quit date: 08/25/1965  . Smokeless tobacco: Former Neurosurgeon    Quit date: 09/01/1965  . Alcohol use No  . Drug use: No  . Sexual activity: Not on file   Other Topics Concern  . Not on file   Social History Narrative   Married, 6 children   Right handed   Caffeine use - soda 2 daily    Review of Systems Full 14 system review of systems performed and notable only for those listed, all others are neg:  Constitutional: appetite change, activity change Cardiovascular:  Ear/Nose/Throat:  trouble swallowing Skin:  Eyes:   Respiratory:  SOB Gastroitestinal: abdominal pain, black stools Genitourinary: frequency of urination Hematology/Lymphatic:   Endocrine: cold intolerance Musculoskeletal:  Allergy/Immunology:   Neurological:  Memory loss, HA, tremors Psychiatric: agitation, confusion, depression, suicidal thoughts, nervous/anxious Sleep:   Physical Exam  Vitals:   06/10/16 1103  BP: 115/72  Pulse: (!) 57    General - Well nourished, well developed, mild respiratory distress on portable oxygen.  Ophthalmologic - fundi not visualized due to eye movement.  Cardiovascular - Regular rate and rhythm, no afib rhythm.   Mental Status -  Level of arousal and orientation to time, place, and person were intact. Language including expression, naming, repetition, comprehension, reading, and writing was assessed and found intact. Fund of Knowledge was assessed and was intact.  Cranial Nerves II - XII - II - Visual field intact OU. III, IV, VI - Extraocular movements intact. V - Facial sensation intact bilaterally. VII - Facial movement intact bilaterally. VIII - Hearing & vestibular intact bilaterally. X -  Palate elevates symmetrically, mild dysarthria. XI - Chin turning & shoulder shrug intact bilaterally. XII - Tongue protrusion  intact.  Motor Strength - The patient's strength was normal in all extremities and pronator drift was absent.  Bulk was normal and fasciculations were absent.   Motor Tone - Muscle tone was assessed at the neck and appendages and was normal.  Reflexes - The patient's reflexes were symmetrical in all extremities and he had no pathological reflexes.  Sensory - Light touch, temperature/pinprick were assessed and were normal.    Coordination - The patient had normal movements in the hands with no ataxia or dysmetria.  Tremor was absent.  Gait and Station - not tested and pt in power wheelchair.   Imaging  I have personally reviewed the radiological images below and agree with the radiology interpretations.  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 Acute and subacute RIGHT hemisphere ischemia as described. Tiny focus of chronic hemorrhage RIGHT frontal subcortical white matter could be acute or chronic Atrophy with chronic microvascular ischemic change.  2D echo -01/18/15  - Left ventricle: The cavity size was normal. Wall thickness was normal. Systolic function was normal. The estimated ejection fraction was in the range of 55% to 60%. Wall motion was normal; there were no regional wall motion abnormalities. Doppler parameters are consistent with abnormal left ventricular relaxation (grade 1 diastolic dysfunction). The E/e&' ratio is between 8-15, suggesting indeterminate LV filling pressure. - Left atrium: The atrium was normal in size Impressions: - Compared to the prior echo in 2011, there has been no change in LVEF.  MRA head - 1. The right ICA is occluded, with some retrograde filling of the right ICA terminus, possible via the anterior communicating artery and contralateral circulation. 2. The right M2 inferior  division of right MCA appears to be occluded.  3. Moderate stenosis of left M2 inferior division of left MCA. 4. 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  MRA neck - Abnormal MRI neck (with and without) demonstrating: 1. 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 2. The right vertebral artery has focal high grade origin stenosis. There is addition focal stenosis (>50%) in the right vertebral artery 5.8cm distal to the vertebral origin, and then normal flow up to the vertebrobasilar junction. 3. The left internal carotid artery and left vertebral artery have no stenosis.   MRI brain  08/06/2015 1. Motion degraded, incomplete study. 2. Chronic right frontal lobe MCA infarct with 1 cm focus of superimposed acute ischemia along its superior margin. 3. Moderate cerebral atrophy and chronic small vessel ischemic Disease.   2-D echo  08/07/2015 Left ventricle: The cavity size was normal. Wall thickness was  normal. Systolic function was normal. The estimated ejection  fraction was in the range of 60% to 65%. Wall motion was normal;  there were no regional wall motion abnormalities. The study is  not technically sufficient to allow evaluation of LV diastolic  function.   EEG This normal EEG is recorded in the waking and sleep state. There was no seizure or seizure predisposition recorded on this study. Please note that a normal EEG does not preclude the possibility of epilepsy.    TCD VMR -  Not able to complete it due to SOB and difficulty with breath-holding  Lab Review Component     Latest Ref Rng 02/21/2014 01/23/2015  Cholesterol, Total     100 - 199 mg/dL  161 (H)  Triglycerides     0 - 149 mg/dL  096 (H)  HDL Cholesterol     >  39 mg/dL  53  VLDL Cholesterol Cal     5 - 40 mg/dL  47 (H)  LDL (calc)     0 - 99 mg/dL  161 (H)  Total CHOL/HDL Ratio     0.0 - 5.0 ratio units  4.6  TSH     0.450 - 4.500  uIU/mL 0.76 1.960  Free T4     0.82 - 1.77 ng/dL  0.96  Hemoglobin E4V     4.8 - 5.6 %  6.5 (H)  Est. average glucose Bld gHb Est-mCnc       140   Component     Latest Ref Rng 08/07/2015 11/21/2015  Cholesterol     0 - 200 mg/dL 409   Triglycerides     <150 mg/dL 811 (H)   HDL Cholesterol     >40 mg/dL 55   Total CHOL/HDL Ratio      2.8   VLDL     0 - 40 mg/dL 30   LDL (calc)     0 - 99 mg/dL 68   Hemoglobin B1Y     4.8 - 5.6 % 6.3 (H) 6.5 (H)  Mean Plasma Glucose      134 140     Assessment and Plan:   In summary, KEYION KNACK is a 75 y.o. male with PMH of paroxysmal A. fib not on AC, severe COPD requiring home oxygen follows up in neuro clinic. MRI showed right MCA infarct in 12/2014. Etiology most likely embolic pattern secondary to A. fib not on anticoagulation. 2-D echo normal EF. He is on eliquis for stroke prevention. MRA head and neck confirmed right ICA occlusion but also showed diffuse intracranial stenosis. Need risk factor control. TCD VMR not able to be done due to difficulty with breath holding.  Admitted in 07/2015 for fall and MRI negative for stroke and EEG normal.  Initially started on Keppra , but later discontinued on clinic follow-up. Continue eliquis and Lipitor for stroke prevention. BP at the lower side , recommend stocking socks. Followed with pulmonary and cardiology, on home hospice care now. Had suicidal thoughts, but improved with increased prednisone and improved respiratory status. Will add low dose zoloft.  Plan: - continue eliquis and lipitor for stroke prevention - check BP at home and record. 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 recommend stocking socks to improve BP.  - Follow up with your primary care physician for stroke risk factor modification. Recommend maintain blood pressure goal  120-150/80, diabetes with hemoglobin A1c goal below 6.5% and lipids with LDL cholesterol goal below 70 mg/dL.  - follow up with cardiology and  pulmonology for afib and COPD.  - will start zoloft for depression. Recommended suicide hotline to call if suicidal thoughts. - follow up in 3 months.  I spent more than 25 minutes of face to face time with the patient. Greater than 50% of time was spent in counseling and coordination of care. We discussed about suicidal thoughts, starting SSRI, suicide hotline, and follow up with pulmonary and cardiology.   No orders of the defined types were placed in this encounter.   Meds ordered this encounter  Medications  . sertraline (ZOLOFT) 25 MG tablet    Sig: Take 2 tablets (50 mg total) by mouth daily.    Dispense:  30 tablet    Refill:  2    Patient Instructions  - continue eliquis and lipitor for stroke prevention - check BP at home and record. BP  goal 120-150 due to right ICA occlusion. recommend stocking socks to improve BP.  - Follow up with your primary care physician for stroke risk factor modification. Recommend maintain blood pressure goal  120-150/80, diabetes with hemoglobin A1c goal below 6.5% and lipids with LDL cholesterol goal below 70 mg/dL.  - follow up with cardiology and pulmonology for afib and COPD.  - will start zoloft for depression. Recommended suicide hotline to call if suicidal thoughts. - follow up in 3 months.   Marvel PlanJindong Akaisha Truman, MD PhD [facility] [address], [address] [place], [place] [id number] [phone]

## 2016-06-10 NOTE — Patient Instructions (Addendum)
-   continue eliquis and lipitor for stroke prevention - check BP at home and record. 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 recommend stocking socks to improve BP.  - Follow up with your primary care physician for stroke risk factor modification. Recommend maintain blood pressure goal  120-150/80, diabetes with hemoglobin A1c goal below 6.5% and lipids with LDL cholesterol goal below 70 mg/dL.  - follow up with cardiology and pulmonology for afib and COPD.  - will start zoloft for depression. Recommended suicide hotline to call if suicidal thoughts. - follow up in 3 months.

## 2016-06-11 ENCOUNTER — Other Ambulatory Visit: Payer: Self-pay | Admitting: *Deleted

## 2016-06-11 NOTE — Telephone Encounter (Signed)
Is Dr. Graciela HusbandsKlein ok with refilling this medication? This RX was previously filled by Rubye Oaksammy Parrett NP. Please advise , thanks.

## 2016-06-12 ENCOUNTER — Other Ambulatory Visit: Payer: Self-pay | Admitting: Internal Medicine

## 2016-06-12 NOTE — Telephone Encounter (Signed)
OK to refill

## 2016-06-13 MED ORDER — VERAPAMIL HCL ER 240 MG PO TBCR: 120.0000 mg | EXTENDED_RELEASE_TABLET | Freq: Every day | ORAL | 11 refills | Status: AC

## 2016-06-13 NOTE — Telephone Encounter (Signed)
Rx request declined due to refill already was sent to pharmacy today in another encounter

## 2016-06-16 ENCOUNTER — Other Ambulatory Visit: Payer: Self-pay | Admitting: Internal Medicine

## 2016-07-16 ENCOUNTER — Telehealth: Payer: Self-pay | Admitting: Internal Medicine

## 2016-07-16 MED ORDER — MORPHINE SULFATE 20 MG/5ML PO SOLN: 5.0000 mg | ORAL | 0 refills | Status: DC | PRN

## 2016-07-16 NOTE — Telephone Encounter (Signed)
Called and spoke to Mark [name]. Morphine is needing to be an oral solution 20mg /365ml, 5mg  q4prn for SOB. This was ok'ed by Dr. Maple HudsonYoung. Rx faxed to [facility] on [address]. Pt verbalized understanding and denied any further questions or concerns at this time.

## 2016-07-16 NOTE — Telephone Encounter (Signed)
If they tell us exactly how they want the morphine prescription worded, I will approve it.   Also, please ask that Hospice doctor be able to write prescriptions and orders for this patient.

## 2016-07-16 NOTE — Telephone Encounter (Signed)
Called hospice at main number-was given cell phone to call-[phone] Deanna Artis(Keisha with hospice); aware that I am getting message to CY to advise on Rx and will fax to [facility] once completed.

## 2016-07-22 ENCOUNTER — Other Ambulatory Visit: Payer: Self-pay | Admitting: Neurology

## 2016-07-22 ENCOUNTER — Telehealth: Payer: Self-pay | Admitting: Internal Medicine

## 2016-07-22 DIAGNOSIS — F32A Depression, unspecified: Secondary | ICD-10-CM

## 2016-07-22 DIAGNOSIS — F329 Major depressive disorder, single episode, unspecified: Secondary | ICD-10-CM

## 2016-07-22 MED ORDER — MORPHINE SULFATE 20 MG/5ML PO SOLN: 5.0000 mg | ORAL | 0 refills | Status: AC | PRN

## 2016-07-22 NOTE — Telephone Encounter (Signed)
Spoke with Mark [name] with hospice who is requesting that we send morphine to [facility]. Per CY verbally, okay to [facility]. CY also request that hospice doctor order narcotics from this point. [facility] has been [facility]. Mark [name] is made aware of this and voiced understanding. Nothing further needed.

## 2016-07-22 NOTE — Telephone Encounter (Signed)
lmtcb x1 [facility].

## 2016-07-28 ENCOUNTER — Telehealth: Payer: Self-pay

## 2016-07-28 ENCOUNTER — Telehealth: Payer: Self-pay | Admitting: Internal Medicine

## 2016-07-28 NOTE — Telephone Encounter (Signed)
On 07/28/2016 I received a death certificate from [facility] [employer]. The death certificate is for burial. The patient is a patient of Doctor Young. The death certificate will be taken to Pulmonary Unit @ [facility] this am for signature.  On 07/28/2016 I received the death certificate back from Doctor Young. I got the death certificate ready and called the funeral home to let them know the death certificate is ready for pickup.

## 2016-07-28 NOTE — Telephone Encounter (Signed)
Noted by triage. Signed and forward to CY.

## 2016-08-25 DEATH — deceased

## 2016-09-16 ENCOUNTER — Ambulatory Visit: Payer: Medicare Other | Admitting: Neurology

## 2016-10-13 ENCOUNTER — Ambulatory Visit: Payer: Medicare Other | Admitting: Internal Medicine

## 2016-10-26 IMAGING — CR DG CHEST 1V PORT
1 series · 1 of 1 positions shown · non-contrast
Comparison: 12/05/2015 chest radiograph.

CLINICAL DATA: Shortness of breath and fever.

EXAM:
PORTABLE CHEST 1 VIEW

[AP]
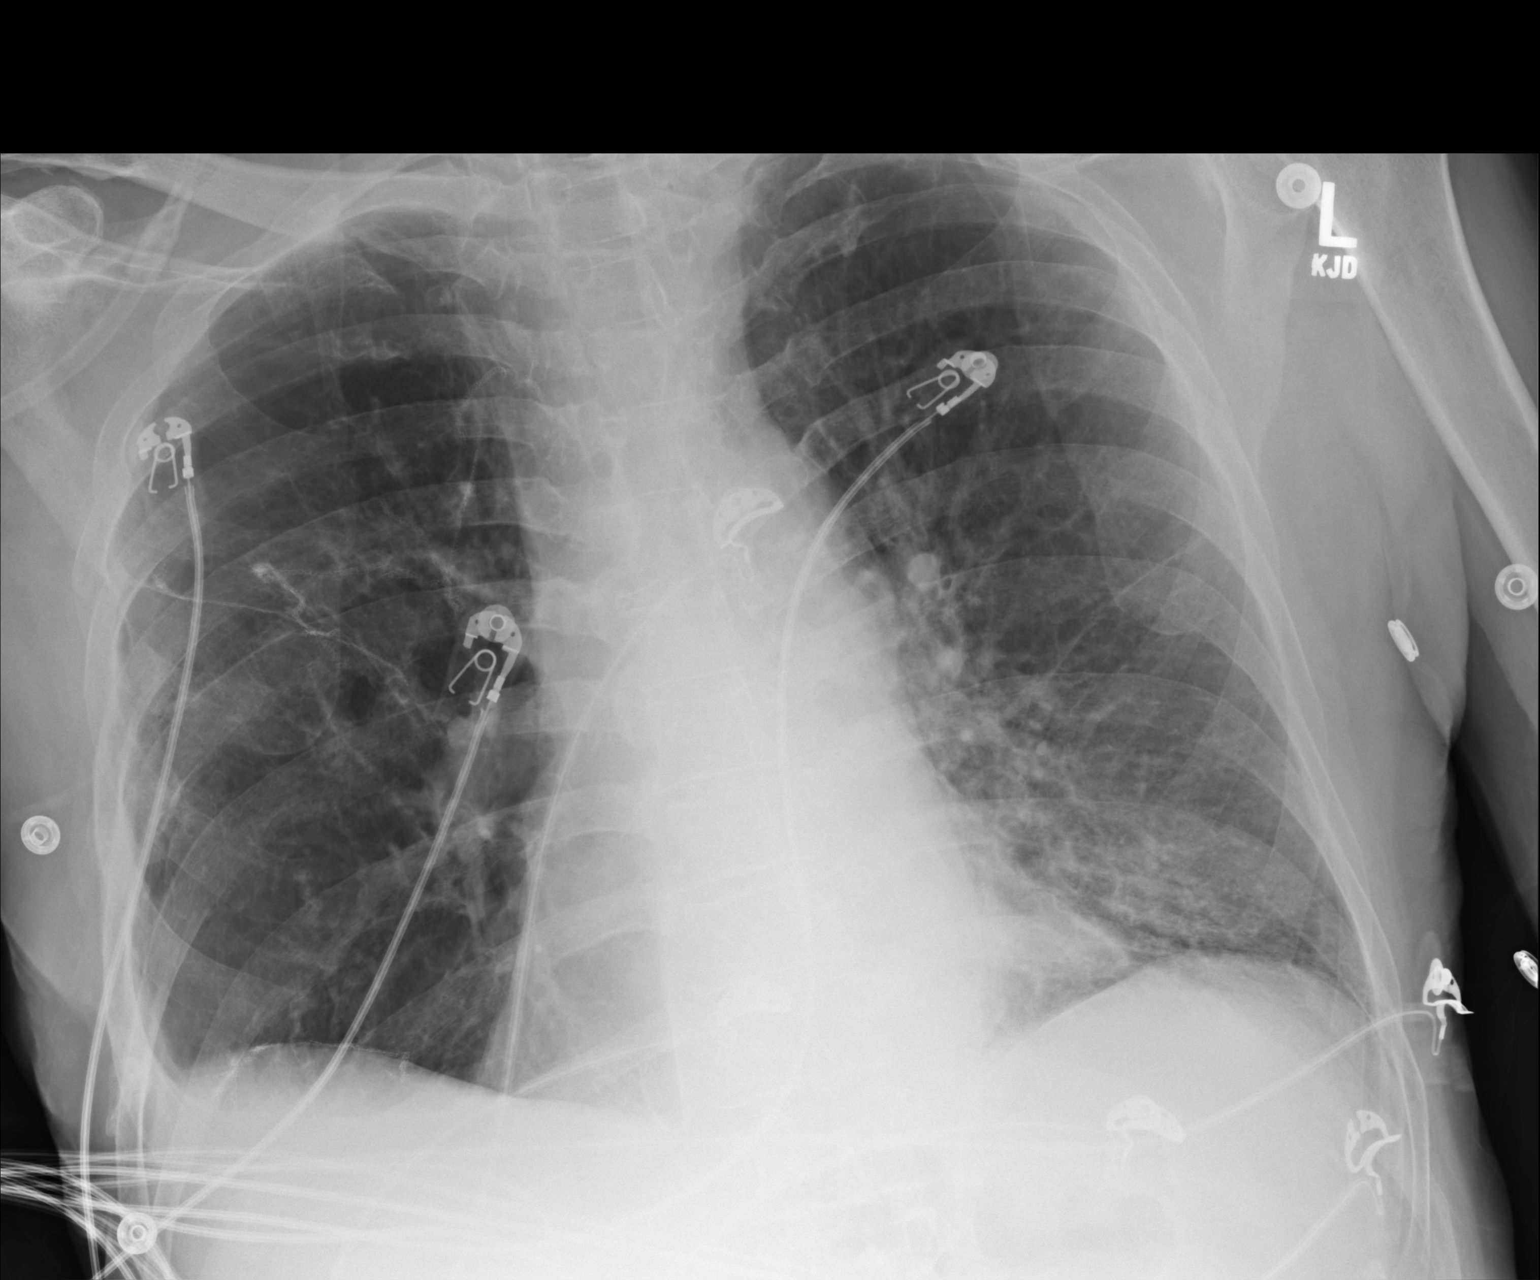

[1 of 1 positions shown; findings below may reference images not displayed]

FINDINGS: Stable cardiomediastinal silhouette with normal heart size. No
pneumothorax. Stable blunting of the right costophrenic angle,
probably chronic pleural-parenchymal scarring. No definite pleural
effusion. Advanced emphysema. Multiple surgical sutures throughout
the right lung. Hazy opacity at the left lung base appears slightly
increased. No pulmonary edema.
IMPRESSION: 1. Hazy left lung base opacity, favor scarring or atelectasis,
difficult to exclude a developing pneumonia. Consider further
evaluation with PA and lateral chest radiographs.
2. Advanced emphysema.
